# Patient Record
Sex: Female | Born: 1950
Health system: Southern US, Community
[De-identification: ages and names within clinical notes are randomized; demographics above are authoritative.]

## PROBLEM LIST (undated history)

## (undated) DIAGNOSIS — N189 Chronic kidney disease, unspecified: Secondary | ICD-10-CM

## (undated) DIAGNOSIS — E079 Disorder of thyroid, unspecified: Secondary | ICD-10-CM

## (undated) DIAGNOSIS — A6000 Herpesviral infection of urogenital system, unspecified: Secondary | ICD-10-CM

## (undated) DIAGNOSIS — E785 Hyperlipidemia, unspecified: Secondary | ICD-10-CM

## (undated) DIAGNOSIS — G459 Transient cerebral ischemic attack, unspecified: Secondary | ICD-10-CM

## (undated) DIAGNOSIS — F329 Major depressive disorder, single episode, unspecified: Secondary | ICD-10-CM

## (undated) DIAGNOSIS — I639 Cerebral infarction, unspecified: Secondary | ICD-10-CM

## (undated) DIAGNOSIS — F32A Depression, unspecified: Secondary | ICD-10-CM

## (undated) DIAGNOSIS — Z85820 Personal history of malignant melanoma of skin: Secondary | ICD-10-CM

## (undated) DIAGNOSIS — I10 Essential (primary) hypertension: Secondary | ICD-10-CM

## (undated) HISTORY — DX: Major depressive disorder, single episode, unspecified: F32.9

## (undated) HISTORY — PX: MELANOMA EXCISION: SHX5266

## (undated) HISTORY — DX: Disorder of thyroid, unspecified: E07.9

## (undated) HISTORY — DX: Transient cerebral ischemic attack, unspecified: G45.9

## (undated) HISTORY — DX: Herpesviral infection of urogenital system, unspecified: A60.00

## (undated) HISTORY — DX: Essential (primary) hypertension: I10

## (undated) HISTORY — PX: ABDOMINAL HYSTERECTOMY: SHX81

## (undated) HISTORY — DX: Personal history of malignant melanoma of skin: Z85.820

## (undated) HISTORY — PX: APPENDECTOMY: SHX54

## (undated) HISTORY — PX: CHOLECYSTECTOMY: SHX55

## (undated) HISTORY — DX: Chronic kidney disease, unspecified: N18.9

## (undated) HISTORY — DX: Depression, unspecified: F32.A

## (undated) HISTORY — DX: Cerebral infarction, unspecified: I63.9

## (undated) HISTORY — DX: Hyperlipidemia, unspecified: E78.5

---

## 2005-06-02 ENCOUNTER — Emergency Department (HOSPITAL_COMMUNITY): Admission: EM | Admit: 2005-06-02 | Discharge: 2005-06-02 | Payer: Self-pay | Admitting: Emergency Medicine

## 2006-10-02 DIAGNOSIS — G459 Transient cerebral ischemic attack, unspecified: Secondary | ICD-10-CM

## 2006-10-02 HISTORY — DX: Transient cerebral ischemic attack, unspecified: G45.9

## 2006-11-01 ENCOUNTER — Inpatient Hospital Stay (HOSPITAL_COMMUNITY): Admission: EM | Admit: 2006-11-01 | Discharge: 2006-11-04 | Payer: Self-pay | Admitting: Emergency Medicine

## 2006-11-02 ENCOUNTER — Encounter (INDEPENDENT_AMBULATORY_CARE_PROVIDER_SITE_OTHER): Payer: Self-pay | Admitting: Emergency Medicine

## 2008-01-21 ENCOUNTER — Emergency Department (HOSPITAL_COMMUNITY): Admission: EM | Admit: 2008-01-21 | Discharge: 2008-01-21 | Payer: Self-pay | Admitting: Emergency Medicine

## 2009-12-17 ENCOUNTER — Observation Stay (HOSPITAL_COMMUNITY): Admission: EM | Admit: 2009-12-17 | Discharge: 2009-12-19 | Payer: Self-pay | Admitting: Emergency Medicine

## 2009-12-18 ENCOUNTER — Encounter (INDEPENDENT_AMBULATORY_CARE_PROVIDER_SITE_OTHER): Payer: Self-pay | Admitting: Internal Medicine

## 2009-12-18 ENCOUNTER — Ambulatory Visit: Payer: Self-pay | Admitting: Vascular Surgery

## 2010-05-15 LAB — BASIC METABOLIC PANEL
BUN: 14 mg/dL (ref 6–23)
BUN: 17 mg/dL (ref 6–23)
CO2: 27 mEq/L (ref 19–32)
CO2: 28 mEq/L (ref 19–32)
Calcium: 8.8 mg/dL (ref 8.4–10.5)
Calcium: 9.4 mg/dL (ref 8.4–10.5)
Chloride: 106 mEq/L (ref 96–112)
Chloride: 107 mEq/L (ref 96–112)
Creatinine, Ser: 1.05 mg/dL (ref 0.4–1.2)
Creatinine, Ser: 1.28 mg/dL — ABNORMAL HIGH (ref 0.4–1.2)
GFR calc Af Amer: 52 mL/min — ABNORMAL LOW (ref >60)
GFR calc Af Amer: >60 mL/min (ref >60)
GFR calc non Af Amer: 43 mL/min — ABNORMAL LOW (ref >60)
GFR calc non Af Amer: 54 mL/min — ABNORMAL LOW (ref >60)
Glucose, Bld: 100 mg/dL — ABNORMAL HIGH (ref 70–99)
Glucose, Bld: 90 mg/dL (ref 70–99)
Potassium: 3 mEq/L — ABNORMAL LOW (ref 3.5–5.1)
Potassium: 3.2 mEq/L — ABNORMAL LOW (ref 3.5–5.1)
Sodium: 138 mEq/L (ref 135–145)
Sodium: 141 mEq/L (ref 135–145)

## 2010-05-15 LAB — DIFFERENTIAL
Basophils Absolute: 0 10*3/uL (ref 0.0–0.1)
Basophils Relative: 0 % (ref 0–1)
Eosinophils Absolute: 0.1 10*3/uL (ref 0.0–0.7)
Eosinophils Relative: 1 % (ref 0–5)
Lymphocytes Relative: 32 % (ref 12–46)
Lymphs Abs: 2.8 10*3/uL (ref 0.7–4.0)
Monocytes Absolute: 0.9 10*3/uL (ref 0.1–1.0)
Monocytes Relative: 10 % (ref 3–12)
Neutro Abs: 5.1 10*3/uL (ref 1.7–7.7)
Neutrophils Relative %: 57 % (ref 43–77)

## 2010-05-15 LAB — LIPID PANEL
Cholesterol: 141 mg/dL (ref 0–200)
HDL: 40 mg/dL (ref >39)
LDL Cholesterol: 74 mg/dL (ref 0–99)
Total CHOL/HDL Ratio: 3.5 RATIO
Triglycerides: 135 mg/dL (ref <150)
VLDL: 27 mg/dL (ref 0–40)

## 2010-05-15 LAB — HEPATIC FUNCTION PANEL
ALT: 38 U/L — ABNORMAL HIGH (ref 0–35)
AST: 26 U/L (ref 0–37)
Albumin: 4.1 g/dL (ref 3.5–5.2)
Alkaline Phosphatase: 81 U/L (ref 39–117)
Bilirubin, Direct: <0.1 mg/dL (ref 0.0–0.3)
Total Bilirubin: 0.6 mg/dL (ref 0.3–1.2)
Total Protein: 6.4 g/dL (ref 6.0–8.3)

## 2010-05-15 LAB — COMPREHENSIVE METABOLIC PANEL
ALT: 34 U/L (ref 0–35)
AST: 27 U/L (ref 0–37)
Albumin: 3.7 g/dL (ref 3.5–5.2)
Alkaline Phosphatase: 72 U/L (ref 39–117)
BUN: 14 mg/dL (ref 6–23)
CO2: 25 mEq/L (ref 19–32)
Calcium: 9 mg/dL (ref 8.4–10.5)
Chloride: 107 mEq/L (ref 96–112)
Creatinine, Ser: 1.12 mg/dL (ref 0.4–1.2)
GFR calc Af Amer: >60 mL/min (ref >60)
GFR calc non Af Amer: 50 mL/min — ABNORMAL LOW (ref >60)
Glucose, Bld: 120 mg/dL — ABNORMAL HIGH (ref 70–99)
Potassium: 3.2 mEq/L — ABNORMAL LOW (ref 3.5–5.1)
Sodium: 139 mEq/L (ref 135–145)
Total Bilirubin: 0.6 mg/dL (ref 0.3–1.2)
Total Protein: 6.4 g/dL (ref 6.0–8.3)

## 2010-05-15 LAB — CBC
HCT: 36.7 % (ref 36.0–46.0)
HCT: 38.3 % (ref 36.0–46.0)
Hemoglobin: 12.5 g/dL (ref 12.0–15.0)
Hemoglobin: 13.1 g/dL (ref 12.0–15.0)
MCH: 30.2 pg (ref 26.0–34.0)
MCH: 30.5 pg (ref 26.0–34.0)
MCHC: 34.1 g/dL (ref 30.0–36.0)
MCHC: 34.2 g/dL (ref 30.0–36.0)
MCV: 88.6 fL (ref 78.0–100.0)
MCV: 89.3 fL (ref 78.0–100.0)
Platelets: 267 10*3/uL (ref 150–400)
Platelets: 294 10*3/uL (ref 150–400)
RBC: 4.14 MIL/uL (ref 3.87–5.11)
RBC: 4.29 MIL/uL (ref 3.87–5.11)
RDW: 13.1 % (ref 11.5–15.5)
RDW: 13.1 % (ref 11.5–15.5)
WBC: 8.9 10*3/uL (ref 4.0–10.5)
WBC: 9.6 10*3/uL (ref 4.0–10.5)

## 2010-05-15 LAB — URINALYSIS, ROUTINE W REFLEX MICROSCOPIC
Bilirubin Urine: NEGATIVE
Glucose, UA: NEGATIVE mg/dL
Hgb urine dipstick: NEGATIVE
Ketones, ur: NEGATIVE mg/dL
Nitrite: NEGATIVE
Protein, ur: NEGATIVE mg/dL
Specific Gravity, Urine: 1.012 (ref 1.005–1.030)
Urobilinogen, UA: 0.2 mg/dL (ref 0.0–1.0)
pH: 6 (ref 5.0–8.0)

## 2010-05-15 LAB — CK TOTAL AND CKMB (NOT AT ARMC)
CK, MB: 1.4 ng/mL (ref 0.3–4.0)
CK, MB: 1.8 ng/mL (ref 0.3–4.0)
CK, MB: 1.9 ng/mL (ref 0.3–4.0)
Relative Index: INVALID (ref 0.0–2.5)
Relative Index: INVALID (ref 0.0–2.5)
Relative Index: INVALID (ref 0.0–2.5)
Total CK: 68 U/L (ref 7–177)
Total CK: 76 U/L (ref 7–177)
Total CK: 91 U/L (ref 7–177)

## 2010-05-15 LAB — POCT CARDIAC MARKERS
CKMB, poc: <1 ng/mL — ABNORMAL LOW (ref 1.0–8.0)
Myoglobin, poc: 54.8 ng/mL (ref 12–200)
Troponin i, poc: <0.05 ng/mL (ref 0.00–0.09)

## 2010-05-15 LAB — PROTIME-INR
INR: 0.98 (ref 0.00–1.49)
Prothrombin Time: 13.2 seconds (ref 11.6–15.2)

## 2010-05-15 LAB — TSH: TSH: 0.725 u[IU]/mL (ref 0.350–4.500)

## 2010-05-15 LAB — TROPONIN I
Troponin I: <0.01 ng/mL (ref 0.00–0.06)
Troponin I: <0.01 ng/mL (ref 0.00–0.06)
Troponin I: <0.01 ng/mL (ref 0.00–0.06)

## 2010-05-15 LAB — APTT: aPTT: 31 seconds (ref 24–37)

## 2010-07-16 NOTE — H&P (Signed)
NAME:  Becky [name], TIRONE NO.:  [id number]   MEDICAL RECORD NO.:  [id number]          PATIENT TYPE:  EMS   LOCATION:  MAJO                         FACILITY:  [facility]   PHYSICIAN:  Marlan Palau, M.D.  DATE OF BIRTH:  01-16-1951   DATE OF ADMISSION:  11/01/2006  DATE OF DISCHARGE:                              HISTORY & PHYSICAL   HISTORY OF PRESENT ILLNESS:  Becky [name] is a 60 year old right-  handed white female born 06-09-1950, with a history of  hypertension followed by Dr. Catha Gosselin.  This patient has no known  prior history of stroke but comes to the [facility] emergency room today  after onset of an event that occurred at around 4 p.m.  The patient was  trying to get into her car and noted that her right hand became weak and  clumsy.  The patient felt some numbness on the right face, had right  facial droop.  Denies any visual field changes at that time.  Did  complain of a headache for most of the day today.  The patient was noted  to have some slurred speech and a friend of the patient noted that the  speech also appeared to be somewhat mixed up, unintelligible.  The  patient came to the emergency room but resolved by the time she got back  into an examination room.  The entire event lasted 15-20 minutes.  The  patient has undergone a CT scan of the head that shows what appears to  be an old left frontal stroke.  No acute changes were seen.  Neurology  was called for further evaluation.   The patient has some residual right facial numbness or tingling  sensations but otherwise is back to her usual baseline.   PAST MEDICAL HISTORY:  1. History of new-onset transient right face and arm weakness and      numbness.  2. Hypertension.  3. Depression.  4. Renal calculi.  5. Appendectomy.  6. Hysterectomy.  7. Gallbladder resection.   MEDICATIONS:  1. Benicar 40 mg daily.  2. Hydrochlorothiazide 12.5 mg daily  3. Cymbalta 60 mg daily.   Patient has recently been treated for a kidney stone and has been on  some Cipro, Phenergan and Vicodin.   The patient does not smoke.  Drinks alcohol on occasion.   Again, has an allergy to PENICILLIN and IVP DYE.   SOCIAL HISTORY:  Notable that this patient is single, does not have any  children.  Lives in the [place] area.  Works for [employer]  Again, the patient has no children.   FAMILY MEDICAL HISTORY:  Mother is alive and has history of glaucoma and  hypertension.  Father died with an MI.  The patient has one sister with  hypertension.   REVIEW OF SYSTEMS:  Notable for some feeling of being hot throughout the  week.  The patient does have headaches from time to time.  Does note  some neck pain, some nausea, with a kidney stone recently and some  diarrhea with the kidney stone.  The patient denies  any problems  controlling the bladder and has some slight dizziness.  Denies any  blackout episodes or seizures.   PHYSICAL EXAMINATION:  VITALS:  Blood pressure is 162/95, heart rate 93,  respiratory rate 16, temperature afebrile.  GENERAL:  This patient is a minimally obese white female who is alert  and cooperative at the time examination.  HEENT: Head is atraumatic.  Eyes:  Pupils are equal, round and react to  light.  Discs are flat bilaterally.  NECK:  Supple.  No carotid bruits noted.  RESPIRATORY:  Clear.  CARDIOVASCULAR:  A regular rate and rhythm.  No obvious murmurs or rubs  noted.  ABDOMEN:  Positive bowel sounds.  No organomegaly or tenderness noted.  EXTREMITIES:  Without significant edema.  NEUROLOGIC EXAMINATION:  Cranial nerves as above.  Facial symmetry is  present.  The patient has good sensation in the face to pinprick and  soft touch bilaterally.  Has good strength of the facial muscle muscles  and the muscles of head turning and shoulder shrug bilaterally.  Speech  is well-enunciated and not aphasic.  Motor testing shows 5/5 strength in  all  fours.  Good symmetric motor tone is noted throughout.  No drift is  seen on the arms and legs.  Sensory testing is intact to pinprick, soft  touch and vibratory sensation throughout.  No evidence of extinction is  noted.  The patient has good finger-nose-finger and heel-to-shin.  Gait  was not tested.  Deep tendon reflexes symmetric.  Normal toes, downgoing  bilaterally.   LABORATORY VALUES:  Notable for a white count of 8.2, hemoglobin of  14.3, hematocrit 42.0, MCV of 87.9 platelets of 303.  INR of 0.9.  Sodium 138, potassium 3.7, chloride 108, glucose of 109, BUN of 18,  creatinine 1.1.   CT of the head is as above.   IMPRESSION:  1. Transient episode of right face and arm weakness, numbness,      clumsiness, probable transient ischemic attack event.  2. Hypertension.   This patient's only main risk factor is hypertension.  The patient has  essentially resolved her deficit but has subjective numbness of the  right face.  NIH Stroke Scale today is 0.  The patient is not a t-PA  candidate due to minimal deficit.  The patient has what appears to be an  old subclinical left frontal stroke.  Will pursue further workup at this  point.   PLAN:  1. Admission to [facility].  2. MRI of the brain.  3. MRI angiogram of the intracranial and extracranial vessels.  4. 2-D echocardiogram.  5. Aspirin therapy.   Will follow the patient's clinical course while in-house.      Marlan Palau, M.D.  Electronically Signed     CKW/MEDQ  D:  11/01/2006  T:  11/02/2006  Job:  [id number]   cc:   Caryn Bee L. Little, M.D.  [facility]

## 2010-07-16 NOTE — Discharge Summary (Signed)
NAMEJANAE, [name]              ACCOUNT NO.:  [id number]   MEDICAL RECORD NO.:  [id number]          PATIENT TYPE:  INP   LOCATION:  3708                         FACILITY:  [facility]   PHYSICIAN:  Pramod P. Pearlean Brownie, MD    DATE OF BIRTH:  [birth year]   DATE OF ADMISSION:  11/01/2006  DATE OF DISCHARGE:  11/04/2006                               DISCHARGE SUMMARY   DIAGNOSES AT TIME OF DISCHARGE:  1. Cerebral infarct, transient symptoms secondary to small-vessel      disease.  2. Dyslipidemia.  3. Urinary tract prior to admission.  4. Hypertension.  5. Depression.  6. Renal calculi.  7. Cholecystectomy.  8. Hysterectomy.  9. Appendectomy.   MEDICINES AT TIME OF DISCHARGE:  1. Cipro 500 mg b.i.d. for a total of four more days to complete a 10      days course.  2. Aggrenox one p.o. q.h.s. x2 weeks then increase to b.i.d.  3. Aspirin 81 mg q.a.m. x2 weeks then discontinue.  4. Cymbalta 60 mg a day.  5. Benicar 40 mg a day.  6. Hydrochlorothiazide 12.5 mg a day.   STUDIES PERFORMED:  1. CT of the brain on admission shows no acute abnormality, inferior      left frontal lobe encephalomalacia likely from prior trauma or      infarct.  2. MRI of the brain shows punctate area of diffusion abnormality in      the left centrum semiovale.  May reflect sequelae of TIA and small vessel territory.  Advanced  chronic  ischemic sequelae including prior cortically based infero  anterior left frontal lobe and chronic white matter ischemia.  1. MRA of the neck.  Great vessels are obscured.  2. MRA of the head.  Atherosclerotic disease involving the right      proximal MCA without focal stenosis.  Moderate to severe artifact      versus atherosclerotic disease in P2.  3. Chest x-ray shows no active disease.  4. A 2-D echocardiogram shows EF of 60% with no left ventricular      regional wall motion abnormalities.  There was a trivial      pericardial effusion posterior to the heart.  5. Carotid  Doppler performed, results pending.  6. Transcranial Doppler to be performed, results pending.  7. EKG shows normal sinus rhythm, nonspecific T-wave abnormalities.   HISTORY OF PRESENT ILLNESS:  Becky [name] is a 60 year old right-  handed Caucasian female with a history of hypertension.  She has no  known history of stroke but comes to the [facility] emergency room after  onset.  The patient was trying to get into her car and noticed her right hand  became weak and clumsy.  The patient felt some numbness of the right  face and had right facial droop.  She denied any visual field changes at  that time.  She did complain of a headache for the rest of the day.  The  patient was noticed to have some slurred speech and a friend of the  patient noticed speech also appeared to be somewhat mixed  up and  unintelligible.  The patient came to the emergency room but resolved by  the time she got back to the exam room.  The entire event lasted 15-20  minutes.  CT scan shows what appears to be an old left frontal stroke.  No acute abnormality.  Neurology was called for further evaluation.  She  was not a TPA candidate secondary to time of onset being greater than 3  hours and quick resolution.   HOSPITAL COURSE:  MRI revealed a small punctate infarct in the right  neck.  It is likely a cerebral infarct with transient symptoms as the  patient has totally resolved.  Stroke workup has been completed.  She  has some small vessel findings of hypertension and dyslipidemia for  which she was started on Zocor 40.  She needs ongoing risk factor  control especially with newly diagnosed lipids and ongoing diagnosis of  hypertension.  The patient has been scheduled for an outpatient bubble  study and emboli monitoring with Dr. Pearlean Brownie at the time of discharge.  She had no physical therapy needs and was safe to return home with  family.   CONDITION ON DISCHARGE:  The patient was alert and oriented x3.   Speech  clear.  No aphasia.  No extremity weakness.  Gait steady.   DISCHARGE/PLAN:  1. Discharge home with family once transcranial Doppler and carotid      Doppler are completed.  2. Aggrenox for secondary stroke prevention.  3. Follow-up primary care physician Dr. Clarene Duke for risk factor      control, especially with new statin in  follow up.  1. Follow up with Dr. Delia Heady for emboli study and bubble      monitoring.  She is to call for an appointment.      Annie Main, N.P.    ______________________________  Sunny Schlein. Pearlean Brownie, MD    SB/MEDQ  D:  11/04/2006  T:  11/04/2006  Job:  [id number]   cc:   Pramod P. Pearlean Brownie, MD  Becky [name], M.D.

## 2010-12-03 LAB — GLUCOSE, CAPILLARY: Glucose-Capillary: 109 — ABNORMAL HIGH

## 2010-12-13 LAB — CBC
HCT: 38.7
Hemoglobin: 13.5
MCHC: 34.8
MCV: 87.9
Platelets: 303
RBC: 4.4
RDW: 13.4
WBC: 8.2

## 2010-12-13 LAB — TROPONIN I: Troponin I: 0.02

## 2010-12-13 LAB — CK TOTAL AND CKMB (NOT AT ARMC)
CK, MB: 1.4
Relative Index: INVALID
Total CK: 52

## 2010-12-13 LAB — LIPID PANEL
Cholesterol: 235 — ABNORMAL HIGH
HDL: 30 — ABNORMAL LOW
LDL Cholesterol: 150 — ABNORMAL HIGH
Total CHOL/HDL Ratio: 7.8
Triglycerides: 277 — ABNORMAL HIGH
VLDL: 55 — ABNORMAL HIGH

## 2010-12-13 LAB — POCT I-STAT 3, ART BLOOD GAS (G3+)
Acid-base deficit: 1
Bicarbonate: 22.6
O2 Saturation: 95
Operator id: 279121
Patient temperature: 98.1
TCO2: 24
pCO2 arterial: 33.7 — ABNORMAL LOW
pH, Arterial: 7.434 — ABNORMAL HIGH
pO2, Arterial: 73 — ABNORMAL LOW

## 2010-12-13 LAB — BASIC METABOLIC PANEL
BUN: 14
BUN: 17
CO2: 22
CO2: 25
Calcium: 9
Calcium: 9.3
Chloride: 109
Chloride: 110
Creatinine, Ser: 0.91
Creatinine, Ser: 0.98
GFR calc Af Amer: >60
GFR calc Af Amer: >60
GFR calc non Af Amer: 59 — ABNORMAL LOW
GFR calc non Af Amer: >60
Glucose, Bld: 104 — ABNORMAL HIGH
Glucose, Bld: 99
Potassium: 3.4 — ABNORMAL LOW
Potassium: 4.2
Sodium: 138
Sodium: 140

## 2010-12-13 LAB — PROTIME-INR
INR: 0.9
Prothrombin Time: 12.8

## 2010-12-13 LAB — DIFFERENTIAL
Basophils Absolute: 0
Basophils Relative: 0
Eosinophils Absolute: 0.3
Eosinophils Relative: 3
Lymphocytes Relative: 25
Lymphs Abs: 2.1
Monocytes Absolute: 0.7
Monocytes Relative: 9
Neutro Abs: 5.2
Neutrophils Relative %: 63

## 2010-12-13 LAB — COMPREHENSIVE METABOLIC PANEL
ALT: 28
AST: 23
Albumin: 3.9
Alkaline Phosphatase: 82
BUN: 14
CO2: 23
Calcium: 9.4
Chloride: 109
Creatinine, Ser: 1.1
GFR calc Af Amer: >60
GFR calc non Af Amer: 52 — ABNORMAL LOW
Glucose, Bld: 141 — ABNORMAL HIGH
Potassium: 3.1 — ABNORMAL LOW
Sodium: 138
Total Bilirubin: 0.7
Total Protein: 6.6

## 2010-12-13 LAB — RAPID URINE DRUG SCREEN, HOSP PERFORMED
Amphetamines: NOT DETECTED
Barbiturates: NOT DETECTED
Benzodiazepines: NOT DETECTED
Cocaine: NOT DETECTED
Opiates: NOT DETECTED
Tetrahydrocannabinol: NOT DETECTED

## 2010-12-13 LAB — I-STAT 8, (EC8 V) (CONVERTED LAB)
Acid-base deficit: 3 — ABNORMAL HIGH
BUN: 18
Bicarbonate: 20.6
Chloride: 108
Glucose, Bld: 109 — ABNORMAL HIGH
HCT: 42
Hemoglobin: 14.3
Operator id: 196461
Potassium: 3.7
Sodium: 138
TCO2: 22
pCO2, Ven: 31.2 — ABNORMAL LOW
pH, Ven: 7.429 — ABNORMAL HIGH

## 2010-12-13 LAB — URINALYSIS, ROUTINE W REFLEX MICROSCOPIC
Bilirubin Urine: NEGATIVE
Glucose, UA: NEGATIVE
Hgb urine dipstick: NEGATIVE
Ketones, ur: NEGATIVE
Nitrite: NEGATIVE
Protein, ur: NEGATIVE
Specific Gravity, Urine: 1.008
Urobilinogen, UA: 0.2
pH: 5.5

## 2010-12-13 LAB — HOMOCYSTEINE: Homocysteine: 12.6

## 2010-12-13 LAB — HEMOGLOBIN A1C
Hgb A1c MFr Bld: 5.6
Mean Plasma Glucose: 122

## 2010-12-13 LAB — POCT I-STAT CREATININE
Creatinine, Ser: 1.1
Operator id: 196461

## 2010-12-13 LAB — ETHANOL: Alcohol, Ethyl (B): <5

## 2010-12-13 LAB — APTT: aPTT: 24

## 2013-03-26 ENCOUNTER — Ambulatory Visit (INDEPENDENT_AMBULATORY_CARE_PROVIDER_SITE_OTHER): Payer: BC Managed Care – PPO | Admitting: Emergency Medicine

## 2013-03-26 VITALS — BP 122/84 | HR 90 | Temp 98.0°F | Resp 16 | Ht 60.0 in | Wt 179.0 lb

## 2013-03-26 DIAGNOSIS — T1510XA Foreign body in conjunctival sac, unspecified eye, initial encounter: Secondary | ICD-10-CM

## 2013-03-26 MED ORDER — TOBRAMYCIN 0.3 % OP SOLN: 2.0000 [drp] | OPHTHALMIC | Status: DC

## 2013-03-26 MED ORDER — ACETAMINOPHEN-CODEINE #3 300-30 MG PO TABS: 1.0000 | ORAL_TABLET | ORAL | Status: DC | PRN

## 2013-03-26 NOTE — Progress Notes (Signed)
[facility] [address] [place] [address] [phone]  Date:  03/26/2013   Name:  Becky [name]   DOB:  05-27-1950   MRN:  [id number]  PCP:  No primary provider on file.    Chief Complaint: Eye Drainage   History of Present Illness:  Becky [name] is a 63 y.o. very pleasant female patient who presents with the following:  No history of injury or foreign body.  Seen by ophthalmologist for "pink eye" 2-3 weeks ago.  Now has pain in left eye associated with swelling of the lids and photophobia.  Says eye watering and that started yesterday.    There are no active problems to display for this patient.   Past Medical History  Diagnosis Date  . Hypertension     Past Surgical History  Procedure Laterality Date  . Appendectomy    . Cholecystectomy    . Abdominal hysterectomy      History  Substance Use Topics  . Smoking status: Never Smoker   . Smokeless tobacco: Not on file  . Alcohol Use: No    History reviewed. No pertinent family history.  Allergies  Allergen Reactions  . Penicillins Anaphylaxis  . Iohexol      Desc: iodine     Medication list has been reviewed and updated.  No current outpatient prescriptions on file prior to visit.   No current facility-administered medications on file prior to visit.    Review of Systems:  As per HPI, otherwise negative.    Physical Examination: Filed Vitals:   03/26/13 1614  BP: 122/84  Pulse: 90  Temp: 98 F (36.7 C)  Resp: 16   Filed Vitals:   03/26/13 1614  Height: 5' (1.524 m)  Weight: 179 lb (81.194 kg)   Body mass index is 34.96 kg/(m^2). Ideal Body Weight: Weight in (lb) to have BMI = 25: 127.7  GEN: WDWN, NAD, Non-toxic, A & O x 3 HEENT: Atraumatic, Normocephalic. Neck supple. No masses, No LAD. Ears and Nose: No external deformity. CV: RRR, No M/G/R. No JVD. No thrill. No extra heart sounds. PULM: CTA B, no wheezes, crackles, rhonchi. No retractions.  No resp. distress. No accessory muscle use. ABD: S, NT, ND, +BS. No rebound. No HSM. EXTR: No c/c/e NEURO Normal gait.  PSYCH: Normally interactive. Conversant. Not depressed or anxious appearing.  Calm demeanor.  LEFT eye:  PRRERLA EOMI.  Injected conjunctiva.  Swollen lids.  No drainage or marginal crusting.  Fluorescein negative.  Had a filamentous foreign body removed.  Vision is normal by confrontation.     Assessment and Plan: Conjunctival sac FB tobrex Tylenol #3 Ophthalmologist Monday  Signed,  Ellison Carwin, MD

## 2013-03-26 NOTE — Patient Instructions (Signed)
Eye, Foreign Body The term foreign body refers to any object near, on the surface of or in the eye that should not be there. A foreign body may be a small speck of dirt or dust, a hair or eyelash, a splinter or any object. CAUSES  Foreign bodies can get in the eye by:  Flying pieces of something that was broken or destroyed (debris).  A sudden injury (trauma) to the eye. SYMPTOMS  Symptoms depend on what the foreign body is and where it is in the eye. The most common locations are:  On the inner surface of the upper or lower eyelids or on the covering of the white part of the eye (conjunctiva). Symptoms in this location are:  Irritating and painful, especially when blinking.  Feeling like something is in the eye.  On the surface of the clear covering on the front of the eye (cornea). A corneal foreign body has symptoms that:  Are painful and irritating since the cornea is very sensitive.  Form small "rust rings" around a metallic foreign body. Metallic foreign bodies stick more firmly to the surface of the cornea.  Inside the eyeball. Infection can happen fast and can be hard to treat with antibiotics. This is an extremely dangerous situation. Foreign bodies inside the eye can threaten vision. A person may even loose their eye. Foreign bodies inside the eye may cause:  Great pain.  Immediate loss of vision. DIAGNOSIS  Foreign bodies are found during an exam by an eye specialist. Those that are on the eyelids, conjunctiva or cornea are usually (but not always) easily found. When a foreign body is inside the eyeball, a cataract may form almost right away. This makes it hard for an ophthalmologist to find the foreign body. Special tests may be needed, including ultrasound testing, X-rays and CT scans. TREATMENT   Foreign bodies that are on the eyelids, conjunctiva or cornea are often removed easily and painlessly.  If the foreign body has caused a scratch or abrasion of the cornea,  antibiotic drops, ointments and/or a tight patch called a "pressure patch" may be needed. Follow-up exams will be needed for several days until the abrasion heals.  Surgery is needed right away if the foreign body is inside the eyeball. This is a medical emergency. An antibiotic therapy will likely be given to stop an infection. HOME CARE INSTRUCTIONS  The use of eye patches is not universal. Their use varies from state to state and from caregiver to caregiver. If an eye patch was applied:  Keep the eye patch on for as long as directed by your caregiver until the follow-up appointment.  Do not remove the patch to put in medications unless instructed to do so. When replacing the patch, retape it as it was before. Follow the same procedure if the patch becomes loose.  WARNING: Do not drive or operate machinery while the eye is patched. The ability to judge distances will be impaired.  Only take over-the-counter or prescription medicines for pain, discomfort or fever as directed by the caregiver. If no eye patch was applied:  Keep the eye closed as much as possible. Do not rub the eye.  Wear dark glasses as needed to protect the eyes from bright light.  Do not wear contact lenses until the eye feels normal again, or as instructed.  Wear protective eye covering if there is a risk of eye injury. This is important when working with high speed tools.  Only take over-the-counter or   prescription medicines for pain, discomfort or fever as directed by the caregiver. SEEK IMMEDIATE MEDICAL CARE IF:   Pain increases in the eye or the vision changes.  You or your child has problems with the eye patch.  The injury to the eye appears to be getting larger.  There is discharge from the injured eye.  Swelling and/or soreness (inflammation) develops around the affected eye.  You or your child has an oral temperature above 102 F (38.9 C), not controlled by medicine.  Your baby is older than 3  months with a rectal temperature of 102 F (38.9 C) or higher.  Your baby is 3 months old or younger with a rectal temperature of 100.4 F (38 C) or higher. MAKE SURE YOU:   Understand these instructions.  Will watch your condition.  Will get help right away if you are not doing well or get worse. Document Released: 02/17/2005 Document Revised: 05/12/2011 Document Reviewed: 07/15/2012 Exit[employer] Patient Information 2014 [employer].  

## 2013-06-30 ENCOUNTER — Inpatient Hospital Stay: Payer: Self-pay | Admitting: Psychiatry

## 2013-06-30 DIAGNOSIS — F320 Major depressive disorder, single episode, mild: Secondary | ICD-10-CM

## 2013-06-30 LAB — BEHAVIORAL MEDICINE 1 PANEL
Albumin: 4.1 g/dL (ref 3.4–5.0)
Alkaline Phosphatase: 98 U/L
Anion Gap: 8 (ref 7–16)
BUN: 21 mg/dL — ABNORMAL HIGH (ref 7–18)
Basophil #: 0.1 10*3/uL (ref 0.0–0.1)
Basophil %: 0.7 %
Bilirubin,Total: 0.4 mg/dL (ref 0.2–1.0)
Calcium, Total: 9.4 mg/dL (ref 8.5–10.1)
Chloride: 106 mmol/L (ref 98–107)
Co2: 26 mmol/L (ref 21–32)
Creatinine: 1.27 mg/dL (ref 0.60–1.30)
EGFR (African American): 52 — ABNORMAL LOW
EGFR (Non-African Amer.): 45 — ABNORMAL LOW
Eosinophil #: 0.2 10*3/uL (ref 0.0–0.7)
Eosinophil %: 1.7 %
Glucose: 93 mg/dL (ref 65–99)
HCT: 38.7 % (ref 35.0–47.0)
HGB: 13 g/dL (ref 12.0–16.0)
Lymphocyte #: 2.7 10*3/uL (ref 1.0–3.6)
Lymphocyte %: 22.9 %
MCH: 29.8 pg (ref 26.0–34.0)
MCHC: 33.6 g/dL (ref 32.0–36.0)
MCV: 89 fL (ref 80–100)
Monocyte #: 1.1 x10 3/mm — ABNORMAL HIGH (ref 0.2–0.9)
Monocyte %: 9.1 %
Neutrophil #: 7.7 10*3/uL — ABNORMAL HIGH (ref 1.4–6.5)
Neutrophil %: 65.6 %
Osmolality: 282 (ref 275–301)
Platelet: 341 10*3/uL (ref 150–440)
Potassium: 3.2 mmol/L — ABNORMAL LOW (ref 3.5–5.1)
RBC: 4.36 10*6/uL (ref 3.80–5.20)
RDW: 13.4 % (ref 11.5–14.5)
SGOT(AST): 41 U/L — ABNORMAL HIGH (ref 15–37)
SGPT (ALT): 49 U/L (ref 12–78)
Sodium: 140 mmol/L (ref 136–145)
Thyroid Stimulating Horm: 1.29 u[IU]/mL
Total Protein: 7.4 g/dL (ref 6.4–8.2)
WBC: 11.7 10*3/uL — ABNORMAL HIGH (ref 3.6–11.0)

## 2013-06-30 LAB — URINALYSIS, COMPLETE
Bacteria: NONE SEEN
Bilirubin,UR: NEGATIVE
Blood: NEGATIVE
Glucose,UR: NEGATIVE mg/dL (ref 0–75)
Ketone: NEGATIVE
Nitrite: NEGATIVE
Ph: 6 (ref 4.5–8.0)
Protein: NEGATIVE
RBC,UR: <1 /HPF (ref 0–5)
Specific Gravity: 1.015 (ref 1.003–1.030)
Squamous Epithelial: <1
WBC UR: 1 /HPF (ref 0–5)

## 2013-07-01 ENCOUNTER — Ambulatory Visit: Payer: Self-pay | Admitting: Psychiatry

## 2013-11-14 ENCOUNTER — Encounter: Payer: Self-pay | Admitting: General Surgery

## 2013-11-14 DIAGNOSIS — I639 Cerebral infarction, unspecified: Secondary | ICD-10-CM

## 2013-11-14 DIAGNOSIS — I10 Essential (primary) hypertension: Secondary | ICD-10-CM

## 2013-11-14 DIAGNOSIS — E7800 Pure hypercholesterolemia, unspecified: Secondary | ICD-10-CM

## 2013-11-14 HISTORY — DX: Pure hypercholesterolemia, unspecified: E78.00

## 2013-11-14 HISTORY — DX: Cerebral infarction, unspecified: I63.9

## 2014-06-24 NOTE — H&P (Signed)
PATIENT NAME:  Becky [name], Becky [name] MR#:  [id number] DATE OF BIRTH:  09/27/1950  DATE OF ADMISSION:  06/30/2013  IDENTIFYING INFORMATION AND CHIEF COMPLAINT: A 64 year old woman with history of major depression. Admitted voluntarily to the hospital for ECT. Chief complaint: "I'm nervous."   HISTORY OF PRESENT ILLNESS: Information obtained from the patient and my prior interview with her as well as the chart. The patient gives a history of multiple symptoms of major depression, including extremely depressed mood which is persistent almost all of the time, lack of interest in recreation or normal social activities, general fatigue present all of the time, negative thoughts, frequently about herself. General hopelessness. Slowing of cognition. All of these have been present for years but have been getting progressively worse. She has been tried on multiple antidepressants but over the last year, her symptoms have been progressing despite medication trials. The patient is not endorsing acute suicidal ideation but does have a long-standing sense of hopelessness and negativity. She was referred by her primary psychiatrist to see me for ECT workup.   PAST PSYCHIATRIC HISTORY: The patient has had bouts of major depression intermittently for years. No history of mania. She has had ECT at least once before several years ago in [place] with positive results. She has been on multiple medications, including several antidepressants, including Prozac, Effexor, Lexapro and Pristiq, as well as additional antipsychotics and more recently lithium, all without any lasting benefit.   FAMILY HISTORY: Positive for depression.   SOCIAL HISTORY: The patient is unmarried. Has no children. Works as an Web designer at [employer]. Social life is fairly restricted.   SUBSTANCE ABUSE HISTORY: Denies regular use of alcohol or any history of alcohol or drug abuse.   MEDICAL HISTORY: The patient has high blood pressure  for which she takes medication. Dyslipidemia. Gives a history of having had what sounds like a transient ischemic episode a couple of years ago. Has hypothyroidism.   CURRENT MEDICATIONS: Losartan/hydrochlorothiazide combination 100/25 mg once per day, Lipitor 40 mg twice a day, Zetia 10 mg at bedtime, Synthroid 25 mcg per day, aspirin 325 mg twice a day.   ALLERGIES: IV DYE AND PENICILLIN.   REVIEW OF SYSTEMS: Depressed mood, low energy, slow thinking, decreased cognitive skills. Sleeps well. Feels tired during the day. Appetite stable. Denies suicidal or homicidal ideation. Denies hallucinations. No other physical symptoms. The rest of the review of systems is negative.   MENTAL STATUS EXAM: Casually dressed, appropriately groomed woman, looks her stated age, cooperative with the interview. Eye contact good. Psychomotor activity sluggish. Affect flat. Mood stated as nervous. Thoughts are slow but lucid. Denies auditory or visual hallucinations. Denies current suicidal or homicidal ideation. Alert and oriented x 4. Judgment and insight adequate. Short- and long-term memory intact to testing.   PHYSICAL EXAM:  GENERAL: Healthy-appearing woman slightly overweight.  SKIN: No skin lesions identified.  HEENT: Pupils equal and reactive. Face symmetric. Oral mucosa dry. Teeth normal.  NECK AND BACK: Nontender to palpation.  EXTREMITIES: Full range of motion in all extremities. Normal gait.  NEUROLOGIC: Cranial nerves symmetric and normal.  LUNGS: Clear to auscultation.  HEART: Regular rate and rhythm.  ABDOMEN: Soft, nontender. Normal bowel sounds.  CURRENT VITAL SIGNS: Show a temperature of 99.1, pulse 103, respirations 20, blood pressure 107/71.   LABORATORY RESULTS: Chemistry panel on admission shows AST slightly elevated at 41, potassium low at 3.2, BUN elevated at 21, creatinine 127. CBC: Slightly elevated white count at 11.7, otherwise unremarkable. Urinalysis negative.  EKG performed and  showing normal sinus rhythm. Slightly prolonged QT interval but not over 500. Chest x-ray is all normal.   ASSESSMENT: A 64 year old woman with major depression, recurrent, severe, unresponsive to multiple medications. Positive past history of response to electroconvulsive therapy. Requesting electroconvulsive therapy treatment. Good candidate based on history and presentation. No medical contraindication.   TREATMENT PLAN: The patient is admitted to psychiatry for workup and initiation of ECT treatment. Anticipate first ECT treatment right unilateral to be performed tomorrow morning under general anesthesia. She will be seen by anesthesia prior to that. If everything goes well and she recovers without difficulty, we will anticipate discharge tomorrow afternoon, with followup course of ECT done as an outpatient. No other change in medication right now.   DIAGNOSIS, PRINCIPAL AND PRIMARY:  AXIS I: Major depression, severe, recurrent.   SECONDARY DIAGNOSES:  AXIS I: No further.  AXIS II: No diagnosis.  AXIS III: Hypothyroidism, moderately overweight, high blood pressure, past history of transient ischemic attack, dyslipidemia.  AXIS IV: Severe from social isolation and depression.  AXIS V: Functioning at time of evaluation 40.    ____________________________ Gonzella Lex, MD jtc:gb D: 06/30/2013 23:51:58 ET T: 07/01/2013 00:08:33 ET JOB#: [id number]  cc: Gonzella Lex, MD, <Dictator> Gonzella Lex MD ELECTRONICALLY SIGNED 07/02/2013 16:07

## 2014-06-24 NOTE — Discharge Summary (Signed)
PATIENT NAME:  Becky [name], BARIA MR#:  [id number] DATE OF BIRTH:  Nov 28, 1950  DATE OF ADMISSION:  06/30/2013  DATE OF DISCHARGE:  07/01/2013  DATE OF ASSESSMENT:  07/01/2013  Identifying information and hospital course: A 64 year old woman who was admitted for ECT. The patient has a diagnosis of major depression which has been unresponsive to medications and caused severe compromises in her quality of life. She had been accepted for ECT treatment. She was admitted to the hospital on the 30th.   The usual laboratory work-up including full chemistry, profile and CBC, urinalysis, chest x-ray, and EKG were performed. Tests were reviewed and there is nothing there to prohibit ECT. She was seen by anesthesia this morning who had no problem with her getting ECT. The patient met with the ECT treatment team and signed consent paperwork.   This morning she had a session of right unilateral ECT treatment. She tolerated the procedure well, but in the recovery room, had some agitation that required some extra medication. This afternoon, she is slightly groggy but is awake and alert enough to converse and discuss the treatment plan.   She continues to deny suicidal ideation. She is calm and appropriate in her behavior. There is a plan in place for a friend of hers to bring her to the hospital Monday, Wednesday and Friday for ECT treatment. The patient is to continue taking her current medication as prescribed and follow-up with Dr. Toy Care.  She will come back to our hospital for outpatient ECT on Monday. No change to medication that she was taking previously.   During her time here, in addition to ECT, she received group and individual counseling and psychiatric evaluation.   Mental status exam at discharge: Casually dressed, neatly groomed woman, looks her stated age, cooperative with the interview although she looks a little tired. Eye contact good. Psychomotor activity sluggish. Speech decreased in total amount but  understandable. Affect slightly blunted. Mood stated as okay. Thoughts are slow but lucid. No loosening of associations or delusions evident. Denies auditory or visual hallucinations. Denies suicidal or homicidal ideation. She is alert and oriented x4. Short-term memory impaired just for some of the events around the ECT treatment itself but otherwise intact right now. Long-term memory intact. Good fund of knowledge.   LABORATORY RESULTS: The chemistry panel had a slightly high BUN at 21, and a low potassium at 3.2. AST slightly elevated at 41, nothing remarkable. CBC: Had slightly elevated white count at 11.7, otherwise normal. Urinalysis unremarkable.   EKG and chest x-ray all unremarkable.   DISCHARGE MEDICATIONS: She is to continue taking the same medicine she was on before which is Abilify 5 mg at night, Focalin-XR 40 mg per day, levothyroxine 25 mcg per day, aspirin 325 mg twice a day, atorvastatin 40 mg twice a day, Zetia 10 mg at night, Zofran p.r.n. for nausea.   DISPOSITION: Discharge home.  FOLLOWUP:  For a next ECT treatment on Monday.   DiagnosEs, principal and primary:  AXIS I: Major depression, severe, recurrent.   SECONDARY DIAGNOSES: AXIS I: Attention deficit/hyperactivity disorder by history.   AXIS II: Deferred.   AXIS III: Hypertension, history of transient ischemic attack, hypothyroid.   AXIS IV: Moderate from loneliness and illness.   AXIS V: Functioning at time of discharge, 55.   ____________________________ Gonzella Lex, MD jtc:jb D: 07/01/2013 14:00:20 ET  T: 07/01/2013 15:48:20 ET JOB#: [id number] cc: Gonzella Lex, MD, <Dictator> Gonzella Lex MD ELECTRONICALLY SIGNED 07/02/2013 16:07

## 2014-10-02 ENCOUNTER — Ambulatory Visit: Payer: Self-pay | Admitting: Psychiatry

## 2014-10-19 ENCOUNTER — Other Ambulatory Visit: Payer: Self-pay | Admitting: Family Medicine

## 2014-10-19 DIAGNOSIS — I639 Cerebral infarction, unspecified: Secondary | ICD-10-CM

## 2014-12-15 ENCOUNTER — Ambulatory Visit
Admission: RE | Admit: 2014-12-15 | Discharge: 2014-12-15 | Disposition: A | Discharge status: Home / Self Care | Payer: BLUE CROSS/BLUE SHIELD | Source: Ambulatory Visit | Attending: Family Medicine | Admitting: Family Medicine

## 2014-12-15 DIAGNOSIS — I639 Cerebral infarction, unspecified: Secondary | ICD-10-CM

## 2015-08-28 ENCOUNTER — Other Ambulatory Visit: Payer: Self-pay | Admitting: Family Medicine

## 2015-08-28 DIAGNOSIS — R2689 Other abnormalities of gait and mobility: Secondary | ICD-10-CM

## 2015-09-06 ENCOUNTER — Ambulatory Visit: Payer: BLUE CROSS/BLUE SHIELD | Admitting: Neurology

## 2015-09-11 ENCOUNTER — Other Ambulatory Visit: Payer: BLUE CROSS/BLUE SHIELD

## 2015-09-12 ENCOUNTER — Ambulatory Visit: Payer: BLUE CROSS/BLUE SHIELD | Admitting: Cardiology

## 2015-09-18 ENCOUNTER — Encounter: Payer: Self-pay | Admitting: Cardiology

## 2015-09-18 ENCOUNTER — Encounter (INDEPENDENT_AMBULATORY_CARE_PROVIDER_SITE_OTHER): Payer: Self-pay

## 2015-09-18 ENCOUNTER — Ambulatory Visit (INDEPENDENT_AMBULATORY_CARE_PROVIDER_SITE_OTHER): Payer: BLUE CROSS/BLUE SHIELD | Admitting: Cardiology

## 2015-09-18 VITALS — BP 126/94 | HR 85 | Ht 60.0 in | Wt 160.4 lb

## 2015-09-18 DIAGNOSIS — I4581 Long QT syndrome: Secondary | ICD-10-CM | POA: Diagnosis not present

## 2015-09-18 DIAGNOSIS — R9431 Abnormal electrocardiogram [ECG] [EKG]: Secondary | ICD-10-CM

## 2015-09-18 NOTE — Progress Notes (Signed)
Electrophysiology Office Note   Date:  09/18/2015   ID:  Becky [name], [place] August 22, 1950, MRN [id number]  PCP:  Gennette Pac, MD  Primary Electrophysiologist:  Constance Haw, MD    Chief Complaint  Patient presents with  . New Patient (Initial Visit)    prolonged QT     History of Present Illness: Becky [name] is a 65 y.o. female who presents today for electrophysiology evaluation.   She presents for evaluation of falls and long QT interval. She says that her primary physician  Currently has her off of all of her antidepressants and is planning to start her on a new class. She says that she had 7 episodes of falls in the past. She says that when she falls, she is usually up and walking around. There is no other trigger. She says that when she is up and walking, she feels like her legs are moving faster than her upper body and she falls. She says that she can tell each time that she is going to have a fall. She has no palpitations during this, and has not yet lost consciousness. She feels well after the fall. She has no dizziness or altered mental status. She says that the falls has stopped since her medications have been adjusted.   Today, she denies symptoms of palpitations, chest pain, shortness of breath, orthopnea, PND, lower extremity edema, claudication, dizziness, presyncope, syncope, bleeding, or neurologic sequela. The patient is tolerating medications without difficulties and is otherwise without complaint today.    Past Medical History  Diagnosis Date  . Hypertension   . TIA (transient ischemic attack) 8/08    followed by neurologist Dr Leonie Man  . Hyperlipidemia   . Stroke [facility]     Old stroke seen on MRI 12/2009, carotid doppler w minimal plaque, MRI off the head otherwise neg for acute disease.  . Genital herpes   . History of melanoma     followed by derm Dr Delman Cheadle  . Depression     followed by Dr Toy Care  . Chronic kidney disease   . Thyroid  disease     hypothyroid. Followed by Dr Toy Care   Past Surgical History  Procedure Laterality Date  . Appendectomy    . Cholecystectomy    . Abdominal hysterectomy    . Melanoma excision      x2     Current Outpatient Prescriptions  Medication Sig Dispense Refill  . acetaminophen-codeine (TYLENOL #3) 300-30 MG per tablet Take 1-2 tablets by mouth every 4 (four) hours as needed for moderate pain. 15 tablet 0  . acyclovir (ZOVIRAX) 200 MG capsule Take 200 mg by mouth 5 (five) times daily.    Marland Kitchen aspirin EC 325 MG tablet Take 325 mg by mouth daily.    Marland Kitchen atorvastatin (LIPITOR) 40 MG tablet Take 80 mg by mouth daily.    . Calcium 250 MG CAPS Take 500 mg by mouth 2 (two) times daily.    . Cholecalciferol (VITAMIN D) 2000 UNITS tablet Take 2,000 Units by mouth daily.    Marland Kitchen ezetimibe (ZETIA) 10 MG tablet Take 10 mg by mouth daily.    Marland Kitchen levothyroxine (SYNTHROID, LEVOTHROID) 25 MCG tablet Take 25 mcg by mouth daily before breakfast.    . LORazepam (ATIVAN) 0.5 MG tablet Take 1 mg by mouth 2 (two) times daily.    Marland Kitchen losartan-hydrochlorothiazide (HYZAAR) 100-25 MG per tablet Take 1 tablet by mouth daily.    . ondansetron (ZOFRAN-ODT) 4 MG disintegrating  tablet Take 4 mg by mouth every 8 (eight) hours as needed for nausea or vomiting.    . potassium chloride SA (K-DUR,KLOR-CON) 20 MEQ tablet Take 10 mEq by mouth daily.    . ranitidine (ZANTAC) 150 MG tablet Take 150 mg by mouth 2 (two) times daily.    . traZODone (DESYREL) 50 MG tablet Take 50 mg by mouth at bedtime as needed for sleep.     No current facility-administered medications for this visit.    Allergies:   Penicillins; Iohexol; and Nsaids   Social History:  The patient  reports that she has never smoked. She does not have any smokeless tobacco history on file. She reports that she does not drink alcohol or use illicit drugs.   Family History:  The patient's family history includes Breast cancer in her mother; Hyperlipidemia in her father  and sister; Hypertension in her father and sister.    ROS:  Please see the history of present illness.   Otherwise, review of systems is positive for balance problems, depression.   All other systems are reviewed and negative.    PHYSICAL EXAM: VS:  BP 126/94 mmHg  Pulse 85  Ht 5' (1.524 m)  Wt 160 lb 6.4 oz (72.757 kg)  BMI 31.33 kg/m2 , BMI Body mass index is 31.33 kg/(m^2). GEN: Well nourished, well developed, in no acute distress HEENT: normal Neck: no JVD, carotid bruits, or masses Cardiac: RRR; no murmurs, rubs, or gallops,no edema  Respiratory:  clear to auscultation bilaterally, normal work of breathing GI: soft, nontender, nondistended, + BS MS: no deformity or atrophy Skin: warm and dry Neuro:  Strength and sensation are intact Psych: euthymic mood, full affect  EKG:  EKG is ordered today. The ekg ordered today shows sinus rhythm, rate 85, QTc 485  Recent Labs: No results found for requested labs within last 365 days.    Lipid Panel     Component Value Date/Time   CHOL  12/17/2009 2355    141        ATP III CLASSIFICATION:  <200     mg/dL   Desirable  200-239  mg/dL   Borderline High  >=240    mg/dL   High          TRIG 135 12/17/2009 2355   HDL 40 12/17/2009 2355   CHOLHDL 3.5 12/17/2009 2355   VLDL 27 12/17/2009 2355   LDLCALC  12/17/2009 2355    74        Total Cholesterol/HDL:CHD Risk Coronary Heart Disease Risk Table                     Men   Women  1/2 Average Risk   3.4   3.3  Average Risk       5.0   4.4  2 X Average Risk   9.6   7.1  3 X Average Risk  23.4   11.0        Use the calculated Patient Ratio above and the CHD Risk Table to determine the patient's CHD Risk.        ATP III CLASSIFICATION (LDL):  <100     mg/dL   Optimal  100-129  mg/dL   Near or Above                    Optimal  130-159  mg/dL   Borderline  160-189  mg/dL   High  >190     mg/dL  Very High     Wt Readings from Last 3 Encounters:  09/18/15 160 lb 6.4 oz  (72.757 kg)  03/26/13 179 lb (81.194 kg)      Other studies Reviewed: Additional studies/ records that were reviewed today include: PCP notes    ASSESSMENT AND PLAN:  1.  Long QTc: currently, her QTc interval measures 485 ms. In speaking with her, it does not sound like her episodes of falls are related to long QTC and polymorphic ventricular tachycardia. She does not have palpitations, and she feels well during the episodes. She says that she  Simply feels like her legs are moving too fast.  No family history of unexplained syncope or sudden death.    Current medicines are reviewed at length with the patient today.   The patient does not have concerns regarding her medicines.  The following changes were made today:  none  Labs/ tests ordered today include:  No orders of the defined types were placed in this encounter.     Disposition:   FU with Jaki Hammerschmidt as needed  Signed, Williemae Muriel Meredith Leeds, MD  09/18/2015 3:24 PM     [address] [place] [address] [phone] (office) [phone] (fax)

## 2015-09-18 NOTE — Patient Instructions (Addendum)
Medication Instructions:  Your physician recommends that you continue on your current medications as directed. Please refer to the Current Medication list given to you today.  * If you need a refill on your cardiac medications before your next appointment, please call your pharmacy.   Labwork: None ordered  Testing/Procedures: None ordered  Follow-Up: No follow up is needed at this time with Dr. Camnitz.  He will see you on an as needed basis.   Thank you for choosing [facility]!!   Randall Rampersad, RN [phone]     

## 2015-09-20 ENCOUNTER — Other Ambulatory Visit: Payer: BLUE CROSS/BLUE SHIELD

## 2015-09-20 ENCOUNTER — Telehealth: Payer: Self-pay | Admitting: Neurology

## 2015-09-20 NOTE — Telephone Encounter (Signed)
Spoke with Becky [name] at Dr. Eddie Dibbles office, she stated that they had the patient authorized 3 separate times for the MRI apt and she had rescheduled every time. Insurance will no longer approve it through Dr. Eddie Dibbles office because the patient has required the auth be changed so many times it is no longer considered urgent. Becky [name] stated that she had given this information to the patient numerous times and told the patient her MRI apt had been canceled but she showed up for the apt anyway. The patient stated that she "may not have remembered because she is having some memory issues". Patient will have to be seen by Dr. Leonie [name] and he can decide if an MRI needs to be ordered by our office.

## 2015-09-20 NOTE — Telephone Encounter (Signed)
Tried to call patient to talk to her about why she cant have MRI until after she sees Dr. Leonie Man but she did not answer. Left a VM asking her to return my call.

## 2015-09-20 NOTE — Telephone Encounter (Signed)
Rn call patient back about [facility] doing the PA for MRI. Pt has a new appt with Dr.Sethi on 10/01/2015 for memory and off balance issues. Dr .Rex Kras referred the patient to [facility] and order the MRI. PT stated she went to [place] and they told her Dr. Rex Kras office did not do a PA. PT stated she call Dr. Rex Kras office and they told her it falls back on Dr. Leonie Man. Rn explain she has a new patient appt, and Dr. Rex Kras office needs to do the PA. Rn explain Dr. Rex Kras office order the MRI in 08/2015, and his office needs to do the PA. Rn spoke with Dentist and she stated that Dr. Rex Kras office needs to get the PA for the MRI. Pt verbalized understanding.

## 2015-09-20 NOTE — Telephone Encounter (Signed)
Met with the patient in office and scheduled her MRI at [place]. Called Dr. Eddie Dibbles office to let them know that they needed to obtain authorization. Beth who handles authorizations was on lunch but will call me back at 2:00.

## 2015-09-20 NOTE — Telephone Encounter (Signed)
Patient called requesting Prior Auth for MRI that was originally ordered by Dr. Rex Kras, states she was scheduled for MRI today at 10:30am was advised by [facility] [address] that patient does not have MRI appointment because Prior Auth was not received. Patient called Dr. Eddie Dibbles office and was advised that she would need to call our office for PA. Patient is scheduled for New Patient appointment with Dr. Leonie Man Monday July 31st.

## 2015-09-28 ENCOUNTER — Other Ambulatory Visit: Payer: BLUE CROSS/BLUE SHIELD

## 2015-10-01 ENCOUNTER — Ambulatory Visit (INDEPENDENT_AMBULATORY_CARE_PROVIDER_SITE_OTHER): Payer: BLUE CROSS/BLUE SHIELD | Admitting: Neurology

## 2015-10-01 ENCOUNTER — Encounter: Payer: Self-pay | Admitting: Neurology

## 2015-10-01 VITALS — BP 155/88 | Temp 85.0°F | Ht 60.0 in | Wt 163.8 lb

## 2015-10-01 DIAGNOSIS — R296 Repeated falls: Secondary | ICD-10-CM | POA: Diagnosis not present

## 2015-10-01 DIAGNOSIS — G3184 Mild cognitive impairment, so stated: Secondary | ICD-10-CM

## 2015-10-01 HISTORY — DX: Mild cognitive impairment of uncertain or unknown etiology: G31.84

## 2015-10-01 HISTORY — DX: Repeated falls: R29.6

## 2015-10-01 NOTE — Progress Notes (Signed)
[facility] [address] [place]. [place] [address] [phone]       OFFICE CONSULT NOTE  Becky. Becky [name] Date of Birth:  [birth year] Medical Record Number:  [id number]   Referring MD:  Hulan Fess  Reason for Referral:  Cognitive impairment and balance problems  HPI: Becky [name] is a 49 year lady with long-standing history of anxiety depression who for the last 1 year or so has had progressive memory and cognitive difficulties as well as increased frequency of falls and balance problems. She is accompanied today by a cousin [name]. Patient admits she did have some mild cognitive memory problems even prior to a year but since she got ECT in June 2016 this seems to have been more pronounced. She got only 4 treatments every other day. 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 She gives several examples like going to plan to change direct drops on her checking account but once she reached that she was unable to tell the bank arrest why she had come. On occasion while driving home from a friend's house she could not remember the way home and got lost. 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 She denies any headaches, extremity weakness. She is also had increased frequency of falls and states that she falls without any warning. She does not feel like she didn't pass out or have any syncopal symptoms. She falls all of a sudden. She is able to stop herself sometimes but has bruised herself. She has never lost consciousness. She denies any tingling numbness pain or burning in her feet. She denies walking like a drunk. She has no history of significant head injury with loss of consciousness. She has no family history of dementia. She lives alone she is now getting some help with finances but needs to keep a  calendar calendar to write things down so she does not forget. She has a remote history of tiny (lacunar infarct in  September 2008. MRA of the brain showed only mild atherosclerotic changes She presented at that time the 30 minute episode of right-sided facial numbness and slurred speech. I had seen her in the hospital at that time but she was lost to follow-up. She states she's had no further recurrent stroke or TIA symptoms.  ROS:   14 system review of systems is positive for  fatigue, hearing loss, skin moles, blurred vision, memory loss, confusion, anxiety, depression, decreased energy, disinterest in activities, restless legs and all other systems negative  PMH:  Past Medical History:  Diagnosis Date  . Chronic kidney disease   . Depression    followed by Dr Toy Care  . Genital herpes   . History of melanoma    followed by derm Dr Delman Cheadle  . Hyperlipidemia   . Hypertension   . Stroke [facility])    Old stroke seen on MRI 12/2009, carotid doppler w minimal plaque, MRI off the head otherwise neg for acute disease.  . Thyroid disease    hypothyroid. Followed by Dr Toy Care  . TIA (transient ischemic attack) 8/08   followed by neurologist Dr Leonie Man    Social History:  Social History   Social History  . Marital status: Single    Spouse name: N/A  . Number of children: N/A  . Years of education:  N/A   Occupational History  . Not on file.   Social History Main Topics  . Smoking status: Never Smoker  . Smokeless tobacco: Never Used  . Alcohol use No  . Drug use: No  . Sexual activity: No   Other Topics Concern  . Not on file   Social History Narrative  . No narrative on file    Medications:   Current Outpatient Prescriptions on File Prior to Visit  Medication Sig Dispense Refill  . acetaminophen-codeine (TYLENOL #3) 300-30 MG per tablet Take 1-2 tablets by mouth every 4 (four) hours as needed for moderate pain. 15 tablet 0  . acyclovir (ZOVIRAX) 200 MG capsule Take 200 mg by mouth  5 (five) times daily.    Marland Kitchen aspirin EC 325 MG tablet Take 325 mg by mouth 2 (two) times daily.     Marland Kitchen atorvastatin (LIPITOR) 40 MG tablet Take 80 mg by mouth daily.    . Cholecalciferol (VITAMIN D) 2000 UNITS tablet Take 5,000 Units by mouth daily.     Marland Kitchen ezetimibe (ZETIA) 10 MG tablet Take 10 mg by mouth daily.    Marland Kitchen levothyroxine (SYNTHROID, LEVOTHROID) 25 MCG tablet Take 25 mcg by mouth daily before breakfast.    . LORazepam (ATIVAN) 0.5 MG tablet Take 1 mg by mouth as needed.     Marland Kitchen losartan-hydrochlorothiazide (HYZAAR) 100-25 MG per tablet Take 1 tablet by mouth daily.    . potassium chloride SA (K-DUR,KLOR-CON) 20 MEQ tablet Take 10 mEq by mouth daily.    . ranitidine (ZANTAC) 150 MG tablet Take 150 mg by mouth 2 (two) times daily.    . traZODone (DESYREL) 50 MG tablet Take 50 mg by mouth at bedtime as needed for sleep.     No current facility-administered medications on file prior to visit.     Allergies:   Allergies  Allergen Reactions  . Penicillins Anaphylaxis  . Iohexol      Desc: iodine   . Nsaids     CKD     Physical Exam General: well developed, well nourished, Middle-aged Caucasian lady seated, in no evident distress Head: head normocephalic and atraumatic.   Neck: supple with no carotid or supraclavicular bruits Cardiovascular: regular rate and rhythm, no murmurs Musculoskeletal: no deformity Skin:  no rash/petichiae Vascular:  Normal pulses all extremities  Neurologic Exam Mental Status: Awake and fully alert. Oriented to place and time. Recent and remote memory intact. Attention span, concentration and fund of knowledge appropriate. Mood and affect appropriate. Mini-Mental status exam scored 29/30 with only one deficit in recall. Animal naming test 11. Clock drawing 4/4. Able to copy intersecting pentagons well. Cranial Nerves: Fundoscopic exam reveals sharp disc margins. Pupils equal, briskly reactive to light. Extraocular movements full without nystagmus. Visual  fields full to confrontation. Hearing intact. Facial sensation intact. Face, tongue, palate moves normally and symmetrically.  Motor: Normal bulk and tone. Normal strength in all tested extremity muscles. Sensory.: intact to touch , pinprick , position and vibratory sensation.  Coordination: Rapid alternating movements normal in all extremities. Finger-to-nose and heel-to-shin performed accurately bilaterally. Gait and Station: Arises from chair without difficulty. Stance is normal. Gait demonstrates normal stride length and balance . Able to heel, toe and tandem walk with slight difficulty.  Reflexes: 3+ brisk and symmetric. Toes downgoing.       ASSESSMENT: 24 year lady with long-standing history of anxiety depression with recent cognitive impairment and memory difficulties likely due to mild cognitive impairment. Early dementia is also  possible but less likely. Increase frequency of recent falls with mild brisk hyperreflexia on exam raising concern for cervical myelopathy. Remote history of left brain lacunar infarct in 2008 with vascular risk factors of hypertension hyperlipidemia.    PLAN: I had a long discussion with the patient and her cousin regarding her memory and cognitive difficulties which likely represent mild cognitive impairment. I encouraged her to increase participation in activities like solving crossword puzzles, sudoku and playing bridge and memory compensation strategies.. I recommend she take fish oil and Prevagen daily. Check labs for reversible causes of memory loss, EEG and MRI scan of the brain and cervical spine finding. We also talked about her frequent falls and discussed fall prevention precautions. She will stay on aspirin for stroke prevention and maintain strict control of hypertension with blood pressure goal below 130/90 and lipids with LDL cholesterol goal below 70 mg percent to prevent recurrent strokes. Greater than 50% time during this 45 minute consultation  visit was spent on counseling and coordination of care about her memory loss and cognitive impairment. Return for follow-up in 2 months or call earlier if necessary.               Antony Contras, MD  [facility] [address] [place], [place] [address]  Phone [phone] Fax [phone]  Note: This document was prepared with digital dictation and possible smart phrase technology. Any transcriptional errors that result from this process are unintentional.

## 2015-10-01 NOTE — Patient Instructions (Signed)
I had a long discussion with the patient and Becky [name] cousin regarding Becky [name] memory and cognitive difficulties which likely represent mild cognitive impairment. I encouraged Becky [name] to increase participation in activities like solving crossword puzzles, sudoku and playing bridge and memory compensation strategies.. I recommend she take fish oil and Prevagen daily. Check labs for reversible causes of memory loss, EEG and MRI scan of the brain and cervical spine finding. We also talked about Becky [name] frequent falls and discussed fall prevention precautions. She will stay on aspirin for stroke prevention and maintain strict control of hypertension with blood pressure goal below 130/90 and lipids with LDL cholesterol goal below 70 mg percent to prevent recurrent strokes. Return for follow-up in 2 months or call earlier if necessary.  Memory Compensation Strategies  1. Use "WARM" strategy.  W= write it down  A= associate it  R= repeat it  M= make a mental note  2.   You can keep a Social worker.  Use a 3-ring notebook with sections for the following: calendar, important names and phone numbers,  medications, doctors' names/phone numbers, lists/reminders, and a section to journal what you did  each day.   3.    Use a calendar to write appointments down.  4.    Write yourself a schedule for the day.  This can be placed on the calendar or in a separate section of the Memory Notebook.  Keeping a  regular schedule can help memory.  5.    Use medication organizer with sections for each day or morning/evening pills.  You may need help loading it  6.    Keep a basket, or pegboard by the door.  Place items that you need to take out with you in the basket or on the pegboard.  You may also want to  include a message board for reminders.  7.    Use sticky notes.  Place sticky notes with reminders in a place where the task is performed.  For example: " turn off the  stove" placed by the stove, "lock the door" placed on the  door at eye level, " take your medications" on  the bathroom mirror or by the place where you normally take your medications.  8.    Use alarms/timers.  Use while cooking to remind yourself to check on food or as a reminder to take your medicine, or as a  reminder to make a call, or as a reminder to perform another task, etc.   Fall Prevention in the Home  Falls can cause injuries. They can happen to people of all ages. There are many things you can do to make your home safe and to help prevent falls.  WHAT CAN I DO ON THE OUTSIDE OF MY HOME?  Regularly fix the edges of walkways and driveways and fix any cracks.  Remove anything that might make you trip as you walk through a door, such as a raised step or threshold.  Trim any bushes or trees on the path to your home.  Use bright outdoor lighting.  Clear any walking paths of anything that might make someone trip, such as rocks or tools.  Regularly check to see if handrails are loose or broken. Make sure that both sides of any steps have handrails.  Any raised decks and porches should have guardrails on the edges.  Have any leaves, snow, or ice cleared regularly.  Use sand or salt on walking paths during winter.  Clean up any spills in  your garage right away. This includes oil or grease spills. WHAT CAN I DO IN THE BATHROOM?   Use night lights.  Install grab bars by the toilet and in the tub and shower. Do not use towel bars as grab bars.  Use non-skid mats or decals in the tub or shower.  If you need to sit down in the shower, use a plastic, non-slip stool.  Keep the floor dry. Clean up any water that spills on the floor as soon as it happens.  Remove soap buildup in the tub or shower regularly.  Attach bath mats securely with double-sided non-slip rug tape.  Do not have throw rugs and other things on the floor that can make you trip. WHAT CAN I DO IN THE BEDROOM?  Use night lights.  Make sure that you have a light by  your bed that is easy to reach.  Do not use any sheets or blankets that are too big for your bed. They should not hang down onto the floor.  Have a firm chair that has side arms. You can use this for support while you get dressed.  Do not have throw rugs and other things on the floor that can make you trip. WHAT CAN I DO IN THE KITCHEN?  Clean up any spills right away.  Avoid walking on wet floors.  Keep items that you use a lot in easy-to-reach places.  If you need to reach something above you, use a strong step stool that has a grab bar.  Keep electrical cords out of the way.  Do not use floor polish or wax that makes floors slippery. If you must use wax, use non-skid floor wax.  Do not have throw rugs and other things on the floor that can make you trip. WHAT CAN I DO WITH MY STAIRS?  Do not leave any items on the stairs.  Make sure that there are handrails on both sides of the stairs and use them. Fix handrails that are broken or loose. Make sure that handrails are as long as the stairways.  Check any carpeting to make sure that it is firmly attached to the stairs. Fix any carpet that is loose or worn.  Avoid having throw rugs at the top or bottom of the stairs. If you do have throw rugs, attach them to the floor with carpet tape.  Make sure that you have a light switch at the top of the stairs and the bottom of the stairs. If you do not have them, ask someone to add them for you. WHAT ELSE CAN I DO TO HELP PREVENT FALLS?  Wear shoes that:  Do not have high heels.  Have rubber bottoms.  Are comfortable and fit you well.  Are closed at the toe. Do not wear sandals.  If you use a stepladder:  Make sure that it is fully opened. Do not climb a closed stepladder.  Make sure that both sides of the stepladder are locked into place.  Ask someone to hold it for you, if possible.  Clearly mark and make sure that you can see:  Any grab bars or handrails.  First and  last steps.  Where the edge of each step is.  Use tools that help you move around (mobility aids) if they are needed. These include:  Canes.  Walkers.  Scooters.  Crutches.  Turn on the lights when you go into a dark area. Replace any light bulbs as soon as they burn out.  Set up your furniture so you have a clear path. Avoid moving your furniture around.  If any of your floors are uneven, fix them.  If there are any pets around you, be aware of where they are.  Review your medicines with your doctor. Some medicines can make you feel dizzy. This can increase your chance of falling. Ask your doctor what other things that you can do to help prevent falls.   This information is not intended to replace advice given to you by your health care provider. Make sure you discuss any questions you have with your health care provider.   Document Released: 12/14/2008 Document Revised: 07/04/2014 Document Reviewed: 03/24/2014 Elsevier Interactive Patient Education [employer].

## 2015-10-02 LAB — DEMENTIA PANEL
Homocysteine: 11.6 umol/L (ref 0.0–15.0)
RPR Ser Ql: NONREACTIVE
TSH: 0.881 u[IU]/mL (ref 0.450–4.500)
Vitamin B-12: 623 pg/mL (ref 211–946)

## 2015-10-09 ENCOUNTER — Telehealth: Payer: Self-pay | Admitting: *Deleted

## 2015-10-09 NOTE — Telephone Encounter (Signed)
Per Dr Leonie Man, LVM informing patient her lab results for reversible causes of memory loss were normal. Left name, number for questions.

## 2015-10-10 ENCOUNTER — Ambulatory Visit (INDEPENDENT_AMBULATORY_CARE_PROVIDER_SITE_OTHER): Payer: BLUE CROSS/BLUE SHIELD

## 2015-10-10 DIAGNOSIS — R410 Disorientation, unspecified: Secondary | ICD-10-CM | POA: Diagnosis not present

## 2015-10-10 DIAGNOSIS — G3184 Mild cognitive impairment, so stated: Secondary | ICD-10-CM

## 2015-10-15 ENCOUNTER — Telehealth: Payer: Self-pay | Admitting: Cardiology

## 2015-10-15 MED ORDER — NADOLOL 20 MG PO TABS: 20.0000 mg | ORAL_TABLET | Freq: Every day | ORAL | 3 refills | Status: DC

## 2015-10-15 NOTE — Telephone Encounter (Signed)
QTc has been increased on prior ECGs.  Can start nadalol 20 mg daily.  Unlikely prolonged QTc is the cause of her falls.

## 2015-10-15 NOTE — Telephone Encounter (Signed)
Called patient with Dr. Curt Bears response. Sent prescription in for Nadolol 20 mg by mouth daily to patient's pharmacy of choice.

## 2015-10-15 NOTE — Telephone Encounter (Signed)
Spoke with patient who states she saw Dr. Curt Bears on 7/18 and she was expecting a call back from him after he reviewed her records.  I reviewed his office visit and asked her if she was confusing her visit with him with the neurology visit with Dr. Leonie Man.  She states it was definitely Dr. Curt Bears and she had confused EEG with EKG.  I advised that I will send message to Dr. Curt Bears for review.  I advised someone from our office will call her back this week with his advice and she verbalized understanding and agreement.

## 2015-10-15 NOTE — Telephone Encounter (Signed)
New message       Pt was seen 09-18-15.  Pt states the doctor was going to go over her previous records and the EEG results and let her know what the next step would be.  She has not heard from [place].  Please call

## 2015-10-19 ENCOUNTER — Ambulatory Visit
Admission: RE | Admit: 2015-10-19 | Discharge: 2015-10-19 | Disposition: A | Discharge status: Home / Self Care | Payer: BLUE CROSS/BLUE SHIELD | Source: Ambulatory Visit | Attending: Neurology | Admitting: Neurology

## 2015-10-19 DIAGNOSIS — R296 Repeated falls: Secondary | ICD-10-CM | POA: Diagnosis not present

## 2015-10-19 DIAGNOSIS — G3184 Mild cognitive impairment, so stated: Secondary | ICD-10-CM | POA: Diagnosis not present

## 2015-11-13 ENCOUNTER — Telehealth: Payer: Self-pay

## 2015-11-13 NOTE — Telephone Encounter (Signed)
Rn spoke with patient about MRI cervical spine. Rn stated per Dr. Leonie Man note Becky Fila, Becky [name]  You 7 hours ago (8:22 AM)    MRI scan of the cervical spine shows only minor wear and tear changes between the sixth and the seventh cervical vertebrae. No pinch nerve or anything major to worry about.   Documentation    Pt verbalized understanding. Pt would like a copy sent to Dr. Rex Kras her PCP.

## 2015-11-13 NOTE — Telephone Encounter (Signed)
MRI scan of the cervical spine shows only minor wear and tear changes between the sixth and the seventh cervical vertebrae. No pinch nerve or anything major to worry about.

## 2015-11-13 NOTE — Telephone Encounter (Signed)
Pt would like results of MRI cervical spine.

## 2015-11-13 NOTE — Telephone Encounter (Signed)
LFt vm for patient to call back about MRi cervical spine results.

## 2015-11-21 ENCOUNTER — Other Ambulatory Visit: Payer: Self-pay | Admitting: Nephrology

## 2015-11-21 DIAGNOSIS — N183 Chronic kidney disease, stage 3 unspecified: Secondary | ICD-10-CM

## 2015-11-21 DIAGNOSIS — Z87442 Personal history of urinary calculi: Secondary | ICD-10-CM

## 2015-11-21 DIAGNOSIS — I129 Hypertensive chronic kidney disease with stage 1 through stage 4 chronic kidney disease, or unspecified chronic kidney disease: Secondary | ICD-10-CM

## 2015-12-04 ENCOUNTER — Other Ambulatory Visit: Payer: BLUE CROSS/BLUE SHIELD

## 2015-12-10 ENCOUNTER — Other Ambulatory Visit: Payer: BLUE CROSS/BLUE SHIELD

## 2015-12-13 ENCOUNTER — Ambulatory Visit: Payer: BLUE CROSS/BLUE SHIELD | Admitting: Neurology

## 2016-01-02 DIAGNOSIS — F3181 Bipolar II disorder: Secondary | ICD-10-CM | POA: Diagnosis not present

## 2016-01-08 DIAGNOSIS — M9905 Segmental and somatic dysfunction of pelvic region: Secondary | ICD-10-CM | POA: Diagnosis not present

## 2016-01-08 DIAGNOSIS — M5134 Other intervertebral disc degeneration, thoracic region: Secondary | ICD-10-CM | POA: Diagnosis not present

## 2016-01-08 DIAGNOSIS — M9903 Segmental and somatic dysfunction of lumbar region: Secondary | ICD-10-CM | POA: Diagnosis not present

## 2016-01-08 DIAGNOSIS — M5431 Sciatica, right side: Secondary | ICD-10-CM | POA: Diagnosis not present

## 2016-01-08 DIAGNOSIS — M5136 Other intervertebral disc degeneration, lumbar region: Secondary | ICD-10-CM | POA: Diagnosis not present

## 2016-01-08 DIAGNOSIS — M9902 Segmental and somatic dysfunction of thoracic region: Secondary | ICD-10-CM | POA: Diagnosis not present

## 2016-01-10 ENCOUNTER — Ambulatory Visit (INDEPENDENT_AMBULATORY_CARE_PROVIDER_SITE_OTHER): Payer: Medicare Other | Admitting: Neurology

## 2016-01-10 ENCOUNTER — Encounter: Payer: Self-pay | Admitting: Neurology

## 2016-01-10 VITALS — BP 133/77 | HR 66 | Ht 60.0 in | Wt 169.6 lb

## 2016-01-10 DIAGNOSIS — G3184 Mild cognitive impairment, so stated: Secondary | ICD-10-CM | POA: Diagnosis not present

## 2016-01-10 NOTE — Patient Instructions (Addendum)

## 2016-01-10 NOTE — Progress Notes (Signed)
[facility] [address] [place]. [place] [address] [phone]       OFFICE CONSULT NOTE  Becky. Becky [name] Date of Birth:  Jun 10, 1950 Medical Record Number:  [id number]   Referring MD:  Hulan Fess  Reason for Referral:  Cognitive impairment and balance problems  HPI: Initial Consult 10/01/15 :  Becky [name] is a 59 year lady with long-standing history of anxiety depression who for the last 1 year or so has had progressive memory and cognitive difficulties as well as increased frequency of falls and balance problems. She is accompanied today by a cousin Becky [name]. Patient admits she did have some mild cognitive memory problems even prior to a year but since she got ECT in June 2016 this seems to have been more pronounced. She got only 4 treatments every other day. 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 She gives several examples like going to plan to change direct drops on her checking account but once she reached that she was unable to tell the bank arrest why she had come. On occasion while driving home from a friend's house she could not remember the way home and got lost. 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 She denies any headaches, extremity weakness. She is also had increased frequency of falls and states that she falls without any warning. She does not feel like she didn't pass out or have any syncopal symptoms. She falls all of a sudden. She is able to stop herself sometimes but has bruised herself. She has never lost consciousness. She denies any tingling numbness pain or burning in her feet. She denies walking like a drunk. She has no history of significant head injury with loss of consciousness. She has no family history of dementia. She lives alone she is now getting some help with  finances but needs to keep a calendar calendar to write things down so she does not forget. She has a remote history of tiny (lacunar infarct in  September 2008. MRA of the brain showed only mild atherosclerotic changes She presented at that time the 30 minute episode of right-sided facial numbness and slurred speech. I had seen her in the hospital at that time but she was lost to follow-up. She states she's had no further recurrent stroke or TIA symptoms. Update 01/10/2016 : She returns for follow-up after last visit 3 months ago. She is accompanied by her friend Becky [name]. Patient states her short-term memory and cognitive difficulties appeared unchanged. She needs constant reminders and needs to write things down. She still independent demented her own affairs. Patient feels that she is feeling a little better after she has cut down some of her medications for anxiety and sleep. She is more alert and more interactive with her surroundings. She had MRI scan of the brain done on 10/2115 which have personally reviewed showed old left frontal infarct with encephalomalacia as well as mild changes of small vessel disease. No acute abnormality was noted. MRI scan cervical spine showed mild degenerative changes mainly at C6-7 but no definite compression is severe spinal stenosis. Lab work for reversible causes of memory loss and 10/01/15 were all normal including B12, TSH, RPR and homocysteine. EEG done on 8/9 for 17 was also normal. Patient has been taking fish oil but has  not been participating in cognitively challenging activities. Patient does feel that she may have Alzheimer's.  ROS:   14 system review of systems is positive for  restless legs, memory loss, confusion, depression and all other systems negative  PMH:  Past Medical History:  Diagnosis Date  . Chronic kidney disease   . Depression    followed by Dr Toy Care  . Genital herpes   . History of melanoma    followed by derm Dr Delman Cheadle  .  Hyperlipidemia   . Hypertension   . Stroke [facility])    Old stroke seen on MRI 12/2009, carotid doppler w minimal plaque, MRI off the head otherwise neg for acute disease.  . Thyroid disease    hypothyroid. Followed by Dr Toy Care  . TIA (transient ischemic attack) 8/08   followed by neurologist Dr Leonie Man    Social History:  Social History   Social History  . Marital status: Single    Spouse name: N/A  . Number of children: N/A  . Years of education: N/A   Occupational History  . Not on file.   Social History Main Topics  . Smoking status: Never Smoker  . Smokeless tobacco: Never Used  . Alcohol use No  . Drug use: No  . Sexual activity: No   Other Topics Concern  . Not on file   Social History Narrative  . No narrative on file    Medications:   Current Outpatient Prescriptions on File Prior to Visit  Medication Sig Dispense Refill  . acetaminophen-codeine (TYLENOL #3) 300-30 MG per tablet Take 1-2 tablets by mouth every 4 (four) hours as needed for moderate pain. 15 tablet 0  . acyclovir (ZOVIRAX) 200 MG capsule Take 200 mg by mouth 5 (five) times daily.    Marland Kitchen ALPRAZolam (XANAX) 0.25 MG tablet 3 (three) times daily as needed.   1  . aspirin EC 325 MG tablet Take 325 mg by mouth 2 (two) times daily.     . Cholecalciferol (VITAMIN D) 2000 UNITS tablet Take 5,000 Units by mouth daily.     Marland Kitchen ezetimibe (ZETIA) 10 MG tablet Take 10 mg by mouth daily.    Marland Kitchen levothyroxine (SYNTHROID, LEVOTHROID) 25 MCG tablet Take 25 mcg by mouth daily before breakfast.    . LORazepam (ATIVAN) 0.5 MG tablet Take 1 mg by mouth as needed.     Marland Kitchen losartan-hydrochlorothiazide (HYZAAR) 100-25 MG per tablet Take 1 tablet by mouth daily.    . nadolol (CORGARD) 20 MG tablet Take 1 tablet (20 mg total) by mouth daily. 90 tablet 3  . potassium chloride SA (K-DUR,KLOR-CON) 20 MEQ tablet Take 10 mEq by mouth daily.    . ranitidine (ZANTAC) 150 MG tablet Take 150 mg by mouth 2 (two) times daily.    . traZODone  (DESYREL) 50 MG tablet Take 50 mg by mouth at bedtime as needed for sleep.     No current facility-administered medications on file prior to visit.     Allergies:   Allergies  Allergen Reactions  . Penicillins Anaphylaxis  . Iodinated Diagnostic Agents Other (See Comments)  . Iohexol      Desc: iodine   . Nsaids     CKD     Physical Exam General: well developed, well nourished, Middle-aged Caucasian lady seated, in no evident distress Head: head normocephalic and atraumatic.   Neck: supple with no carotid or supraclavicular bruits Cardiovascular: regular rate and rhythm, no murmurs Musculoskeletal: no deformity Skin:  no rash/petichiae Vascular:  Normal pulses all extremities  Neurologic Exam Mental Status: Awake and fully alert. Oriented to place and time. Recent and remote memory intact. Attention span, concentration and fund of knowledge appropriate. Mood and affect appropriate. Mini-Mental status not done(last visit 29/30 )  Animal naming test 8. Clock drawing 4/4.   Cranial Nerves: Fundoscopic exam not done. Pupils equal, briskly reactive to light. Extraocular movements full without nystagmus. Visual fields full to confrontation. Hearing intact. Facial sensation intact. Face, tongue, palate moves normally and symmetrically.  Motor: Normal bulk and tone. Normal strength in all tested extremity muscles. Sensory.: intact to touch , pinprick , position and vibratory sensation.  Coordination: Rapid alternating movements normal in all extremities. Finger-to-nose and heel-to-shin performed accurately bilaterally. Gait and Station: Arises from chair without difficulty. Stance is normal. Gait demonstrates normal stride length and balance . Able to heel, toe and tandem walk with slight difficulty.  Reflexes: 3+ brisk and symmetric. Toes downgoing.       ASSESSMENT: 34 year lady with long-standing history of anxiety depression with recent cognitive impairment and memory difficulties  likely due to mild cognitive impairment. Early dementia is  less likely.  nt   Remote history of left brain lacunar infarct in 2008 with vascular risk factors of hypertension hyperlipidemia.    PLAN: I had a long discussion with the patient and her friend Becky [name] about her mild cognitive impairment and the risk for progressing to early dementia and answered questions. I again encouraged her to pulse rate in cognitively challenging activities like solving crossword puzzles, sudoku and playing bridge. Advise her to continue fish oil and also tried taking over-the-counter memory supplements like either Prevagen or Resevetarol. She may also consider possible participation in [place] early Nance trial if interested and given information to review at home and decide. Greater than 50% time during this 25 minute visit was spent on counseling and coordination of care about Becky [name] memory impairment and Alzheimer's She will otherwise return for follow-up in 6 months or call earlier if necessary.               Antony Contras, MD  [facility] [address] [place], [place] [address]  Phone [phone] Fax [phone]  Note: This document was prepared with digital dictation and possible smart phrase technology. Any transcriptional errors that result from this process are unintentional.

## 2016-01-14 DIAGNOSIS — M9905 Segmental and somatic dysfunction of pelvic region: Secondary | ICD-10-CM | POA: Diagnosis not present

## 2016-01-14 DIAGNOSIS — M5134 Other intervertebral disc degeneration, thoracic region: Secondary | ICD-10-CM | POA: Diagnosis not present

## 2016-01-14 DIAGNOSIS — M5136 Other intervertebral disc degeneration, lumbar region: Secondary | ICD-10-CM | POA: Diagnosis not present

## 2016-01-14 DIAGNOSIS — M9902 Segmental and somatic dysfunction of thoracic region: Secondary | ICD-10-CM | POA: Diagnosis not present

## 2016-01-14 DIAGNOSIS — M9903 Segmental and somatic dysfunction of lumbar region: Secondary | ICD-10-CM | POA: Diagnosis not present

## 2016-01-14 DIAGNOSIS — M5431 Sciatica, right side: Secondary | ICD-10-CM | POA: Diagnosis not present

## 2016-01-15 DIAGNOSIS — M9902 Segmental and somatic dysfunction of thoracic region: Secondary | ICD-10-CM | POA: Diagnosis not present

## 2016-01-15 DIAGNOSIS — M5431 Sciatica, right side: Secondary | ICD-10-CM | POA: Diagnosis not present

## 2016-01-15 DIAGNOSIS — M9905 Segmental and somatic dysfunction of pelvic region: Secondary | ICD-10-CM | POA: Diagnosis not present

## 2016-01-15 DIAGNOSIS — M9903 Segmental and somatic dysfunction of lumbar region: Secondary | ICD-10-CM | POA: Diagnosis not present

## 2016-01-15 DIAGNOSIS — M5136 Other intervertebral disc degeneration, lumbar region: Secondary | ICD-10-CM | POA: Diagnosis not present

## 2016-01-15 DIAGNOSIS — M5134 Other intervertebral disc degeneration, thoracic region: Secondary | ICD-10-CM | POA: Diagnosis not present

## 2016-01-21 DIAGNOSIS — F3181 Bipolar II disorder: Secondary | ICD-10-CM | POA: Diagnosis not present

## 2016-01-22 DIAGNOSIS — M5431 Sciatica, right side: Secondary | ICD-10-CM | POA: Diagnosis not present

## 2016-01-22 DIAGNOSIS — M9905 Segmental and somatic dysfunction of pelvic region: Secondary | ICD-10-CM | POA: Diagnosis not present

## 2016-01-22 DIAGNOSIS — M9902 Segmental and somatic dysfunction of thoracic region: Secondary | ICD-10-CM | POA: Diagnosis not present

## 2016-01-22 DIAGNOSIS — M5136 Other intervertebral disc degeneration, lumbar region: Secondary | ICD-10-CM | POA: Diagnosis not present

## 2016-01-22 DIAGNOSIS — M5134 Other intervertebral disc degeneration, thoracic region: Secondary | ICD-10-CM | POA: Diagnosis not present

## 2016-01-22 DIAGNOSIS — M9903 Segmental and somatic dysfunction of lumbar region: Secondary | ICD-10-CM | POA: Diagnosis not present

## 2016-02-05 DIAGNOSIS — J305 Allergic rhinitis due to food: Secondary | ICD-10-CM | POA: Diagnosis not present

## 2016-02-05 DIAGNOSIS — B370 Candidal stomatitis: Secondary | ICD-10-CM | POA: Diagnosis not present

## 2016-02-05 DIAGNOSIS — K121 Other forms of stomatitis: Secondary | ICD-10-CM | POA: Diagnosis not present

## 2016-02-13 DIAGNOSIS — B370 Candidal stomatitis: Secondary | ICD-10-CM | POA: Diagnosis not present

## 2016-02-13 DIAGNOSIS — D3702 Neoplasm of uncertain behavior of tongue: Secondary | ICD-10-CM | POA: Diagnosis not present

## 2016-02-13 DIAGNOSIS — K1379 Other lesions of oral mucosa: Secondary | ICD-10-CM | POA: Diagnosis not present

## 2016-03-26 DIAGNOSIS — F3181 Bipolar II disorder: Secondary | ICD-10-CM | POA: Diagnosis not present

## 2016-04-09 DIAGNOSIS — F3181 Bipolar II disorder: Secondary | ICD-10-CM | POA: Diagnosis not present

## 2016-04-24 DIAGNOSIS — F3181 Bipolar II disorder: Secondary | ICD-10-CM | POA: Diagnosis not present

## 2016-04-30 DIAGNOSIS — E7800 Pure hypercholesterolemia, unspecified: Secondary | ICD-10-CM | POA: Diagnosis not present

## 2016-04-30 DIAGNOSIS — Z79899 Other long term (current) drug therapy: Secondary | ICD-10-CM | POA: Diagnosis not present

## 2016-04-30 DIAGNOSIS — R05 Cough: Secondary | ICD-10-CM | POA: Diagnosis not present

## 2016-05-08 DIAGNOSIS — F3181 Bipolar II disorder: Secondary | ICD-10-CM | POA: Diagnosis not present

## 2016-05-20 DIAGNOSIS — F3181 Bipolar II disorder: Secondary | ICD-10-CM | POA: Diagnosis not present

## 2016-06-04 DIAGNOSIS — F3181 Bipolar II disorder: Secondary | ICD-10-CM | POA: Diagnosis not present

## 2016-06-18 DIAGNOSIS — F3181 Bipolar II disorder: Secondary | ICD-10-CM | POA: Diagnosis not present

## 2016-07-02 DIAGNOSIS — D2272 Melanocytic nevi of left lower limb, including hip: Secondary | ICD-10-CM | POA: Diagnosis not present

## 2016-07-02 DIAGNOSIS — L821 Other seborrheic keratosis: Secondary | ICD-10-CM | POA: Diagnosis not present

## 2016-07-02 DIAGNOSIS — D485 Neoplasm of uncertain behavior of skin: Secondary | ICD-10-CM | POA: Diagnosis not present

## 2016-07-02 DIAGNOSIS — D1800 Hemangioma unspecified site: Secondary | ICD-10-CM | POA: Diagnosis not present

## 2016-07-02 DIAGNOSIS — L570 Actinic keratosis: Secondary | ICD-10-CM | POA: Diagnosis not present

## 2016-07-02 DIAGNOSIS — Z85820 Personal history of malignant melanoma of skin: Secondary | ICD-10-CM | POA: Diagnosis not present

## 2016-07-02 DIAGNOSIS — D225 Melanocytic nevi of trunk: Secondary | ICD-10-CM | POA: Diagnosis not present

## 2016-07-02 DIAGNOSIS — Z86018 Personal history of other benign neoplasm: Secondary | ICD-10-CM | POA: Diagnosis not present

## 2016-07-10 ENCOUNTER — Ambulatory Visit: Payer: BLUE CROSS/BLUE SHIELD | Admitting: Neurology

## 2016-08-07 ENCOUNTER — Ambulatory Visit: Payer: BLUE CROSS/BLUE SHIELD | Admitting: Neurology

## 2016-10-14 ENCOUNTER — Other Ambulatory Visit: Payer: Self-pay | Admitting: Cardiology

## 2016-10-29 DIAGNOSIS — L821 Other seborrheic keratosis: Secondary | ICD-10-CM | POA: Diagnosis not present

## 2016-10-29 DIAGNOSIS — D485 Neoplasm of uncertain behavior of skin: Secondary | ICD-10-CM | POA: Diagnosis not present

## 2016-11-05 DIAGNOSIS — Z87442 Personal history of urinary calculi: Secondary | ICD-10-CM | POA: Diagnosis not present

## 2016-11-05 DIAGNOSIS — Z8673 Personal history of transient ischemic attack (TIA), and cerebral infarction without residual deficits: Secondary | ICD-10-CM | POA: Diagnosis not present

## 2016-11-05 DIAGNOSIS — R7301 Impaired fasting glucose: Secondary | ICD-10-CM | POA: Diagnosis not present

## 2016-11-05 DIAGNOSIS — E039 Hypothyroidism, unspecified: Secondary | ICD-10-CM | POA: Diagnosis not present

## 2016-11-05 DIAGNOSIS — F338 Other recurrent depressive disorders: Secondary | ICD-10-CM | POA: Diagnosis not present

## 2016-11-05 DIAGNOSIS — N189 Chronic kidney disease, unspecified: Secondary | ICD-10-CM | POA: Diagnosis not present

## 2016-11-05 DIAGNOSIS — M8580 Other specified disorders of bone density and structure, unspecified site: Secondary | ICD-10-CM | POA: Diagnosis not present

## 2016-11-05 DIAGNOSIS — M899 Disorder of bone, unspecified: Secondary | ICD-10-CM | POA: Diagnosis not present

## 2016-11-05 DIAGNOSIS — Z23 Encounter for immunization: Secondary | ICD-10-CM | POA: Diagnosis not present

## 2016-11-05 DIAGNOSIS — I10 Essential (primary) hypertension: Secondary | ICD-10-CM | POA: Diagnosis not present

## 2016-11-05 DIAGNOSIS — R413 Other amnesia: Secondary | ICD-10-CM | POA: Diagnosis not present

## 2016-11-05 DIAGNOSIS — Z0000 Encounter for general adult medical examination without abnormal findings: Secondary | ICD-10-CM | POA: Diagnosis not present

## 2016-11-10 DIAGNOSIS — I10 Essential (primary) hypertension: Secondary | ICD-10-CM | POA: Diagnosis not present

## 2016-11-10 DIAGNOSIS — Z87442 Personal history of urinary calculi: Secondary | ICD-10-CM | POA: Diagnosis not present

## 2016-11-10 DIAGNOSIS — Z0000 Encounter for general adult medical examination without abnormal findings: Secondary | ICD-10-CM | POA: Diagnosis not present

## 2016-11-10 DIAGNOSIS — E039 Hypothyroidism, unspecified: Secondary | ICD-10-CM | POA: Diagnosis not present

## 2016-11-10 DIAGNOSIS — Z8673 Personal history of transient ischemic attack (TIA), and cerebral infarction without residual deficits: Secondary | ICD-10-CM | POA: Diagnosis not present

## 2016-11-10 DIAGNOSIS — Z86010 Personal history of colonic polyps: Secondary | ICD-10-CM | POA: Diagnosis not present

## 2016-11-10 DIAGNOSIS — R413 Other amnesia: Secondary | ICD-10-CM | POA: Diagnosis not present

## 2016-11-10 DIAGNOSIS — Z23 Encounter for immunization: Secondary | ICD-10-CM | POA: Diagnosis not present

## 2016-11-10 DIAGNOSIS — R7301 Impaired fasting glucose: Secondary | ICD-10-CM | POA: Diagnosis not present

## 2016-11-10 DIAGNOSIS — E559 Vitamin D deficiency, unspecified: Secondary | ICD-10-CM | POA: Diagnosis not present

## 2016-11-10 DIAGNOSIS — M8580 Other specified disorders of bone density and structure, unspecified site: Secondary | ICD-10-CM | POA: Diagnosis not present

## 2016-11-10 DIAGNOSIS — Z85820 Personal history of malignant melanoma of skin: Secondary | ICD-10-CM | POA: Diagnosis not present

## 2016-11-20 ENCOUNTER — Telehealth: Payer: Self-pay | Admitting: Cardiology

## 2016-11-20 MED ORDER — NADOLOL 20 MG PO TABS: 20.0000 mg | ORAL_TABLET | Freq: Every day | ORAL | 0 refills | Status: DC

## 2016-11-20 NOTE — Telephone Encounter (Signed)
New message      *STAT* If patient is at the pharmacy, call can be transferred to refill team.   1. Which medications need to be refilled? (please list name of each medication and dose if known)   nadolol (CORGARD) 20 MG tablet TAKE ONE TABLET BY MOUTH ONCE DAILY     2. Which pharmacy/location (including street and city if local pharmacy) is medication to be sent to?[facility]   3. Do they need a 30 day or 90 day supply? [place]

## 2016-11-20 NOTE — Telephone Encounter (Signed)
Pt's medication was sent to pt's pharmacy as requested. Confirmation received.  °

## 2016-12-30 DIAGNOSIS — N189 Chronic kidney disease, unspecified: Secondary | ICD-10-CM | POA: Insufficient documentation

## 2016-12-30 DIAGNOSIS — E785 Hyperlipidemia, unspecified: Secondary | ICD-10-CM | POA: Insufficient documentation

## 2016-12-30 DIAGNOSIS — A6000 Herpesviral infection of urogenital system, unspecified: Secondary | ICD-10-CM | POA: Insufficient documentation

## 2016-12-30 DIAGNOSIS — F32A Depression, unspecified: Secondary | ICD-10-CM | POA: Insufficient documentation

## 2016-12-30 DIAGNOSIS — E079 Disorder of thyroid, unspecified: Secondary | ICD-10-CM | POA: Insufficient documentation

## 2016-12-30 DIAGNOSIS — Z85820 Personal history of malignant melanoma of skin: Secondary | ICD-10-CM | POA: Insufficient documentation

## 2016-12-30 DIAGNOSIS — I10 Essential (primary) hypertension: Secondary | ICD-10-CM | POA: Insufficient documentation

## 2016-12-30 DIAGNOSIS — F329 Major depressive disorder, single episode, unspecified: Secondary | ICD-10-CM | POA: Insufficient documentation

## 2016-12-30 DIAGNOSIS — N183 Chronic kidney disease, stage 3 unspecified: Secondary | ICD-10-CM | POA: Insufficient documentation

## 2016-12-30 DIAGNOSIS — I639 Cerebral infarction, unspecified: Secondary | ICD-10-CM | POA: Insufficient documentation

## 2017-01-07 ENCOUNTER — Encounter: Payer: Self-pay | Admitting: Neurology

## 2017-01-07 ENCOUNTER — Ambulatory Visit (INDEPENDENT_AMBULATORY_CARE_PROVIDER_SITE_OTHER): Payer: Medicare Other | Admitting: Neurology

## 2017-01-07 VITALS — BP 125/73 | HR 63 | Wt 176.2 lb

## 2017-01-07 DIAGNOSIS — G3184 Mild cognitive impairment, so stated: Secondary | ICD-10-CM | POA: Diagnosis not present

## 2017-01-07 NOTE — Progress Notes (Signed)
[facility] [address] [place]. [place] [address] [phone]       OFFICE FOLLOW UP VISIT NOTE  Becky. Becky [name] Date of Birth:  1950/12/01 Medical Record Number:  [id number]   Referring MD:  Becky [name]  Reason for Referral:  Cognitive impairment and balance problems  HPI: Initial Consult 10/01/15 :  Becky [name] is a 78 year lady with long-standing history of anxiety depression who for the last 1 year or so has had progressive memory and cognitive difficulties as well as increased frequency of falls and balance problems. She is accompanied today by a cousin Becky [name]. Patient admits she did have some mild cognitive memory problems even prior to a year but since she got ECT in June 2016 this seems to have been more pronounced. She got only 4 treatments every other day. 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 She gives several examples like going to plan to change direct drops on her checking account but once she reached that she was unable to tell the bank arrest why she had come. On occasion while driving home from a friend's house she could not remember the way home and got lost. 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 She denies any headaches, extremity weakness. She is also had increased frequency of falls and states that she falls without any warning. She does not feel like she didn't pass out or have any syncopal symptoms. She falls all of a sudden. She is able to stop herself sometimes but has bruised herself. She has never lost consciousness. She denies any tingling numbness pain or burning in her feet. She denies walking like a drunk. She has no history of significant head injury with loss of consciousness. She has no family history of dementia. She lives alone she is now getting some  help with finances but needs to keep a calendar calendar to write things down so she does not forget. She has a remote history of tiny (lacunar infarct in  September 2008. MRA of the brain showed only mild atherosclerotic changes She presented at that time the 30 minute episode of right-sided facial numbness and slurred speech. I had seen her in the hospital at that time but she was lost to follow-up. She states she's had no further recurrent stroke or TIA symptoms. Update 01/10/2016 : She returns for follow-up after last visit 3 months ago. She is accompanied by her friend Becky [name]. Patient states her short-term memory and cognitive difficulties appeared unchanged. She needs constant reminders and needs to write things down. She still independent demented her own affairs. Patient feels that she is feeling a little better after she has cut down some of her medications for anxiety and sleep. She is more alert and more interactive with her surroundings. She had MRI scan of the brain done on 10/2115 which have personally reviewed showed old left frontal infarct with encephalomalacia as well as mild changes of small vessel disease. No acute abnormality was noted. MRI scan cervical spine showed mild degenerative changes mainly at C6-7 but no definite compression is severe spinal stenosis. Lab work for reversible causes of memory loss and 10/01/15 were all normal including B12, TSH, RPR and homocysteine. EEG done on 8/9 for 17 was also normal. Patient has been taking fish oil  but has not been participating in cognitively challenging activities. Patient does feel that she may have Alzheimer's.  Update 01/07/2017 : She returns for follow-up after last visit a year ago.. She states she is doing well she has had no new neurological issues.  She continues to have mild short-term memory difficulties which are unchanged.  She has learned to use a calendar and does keep herself busy with doing card games.  She is still fully  functional and independent managing her own affairs.  She states her blood pressure is quite good today it is 1 2 5/70.  She remains on aspirin which is tolerating well without significant bruising or bleeding.  She is also on Crestor which is tolerating well without muscle aches and pains.  If she denies any new neurological complaints.  She has had no new major medical problems since her last visit. ROS:   14 system review of systems is positive for   memory loss, confusion, depression and all other systems negative  PMH:  Past Medical History:  Diagnosis Date  . Chronic kidney disease   . Depression    followed by Dr Becky [name]  . Genital herpes   . History of melanoma    followed by derm Dr Becky [name]  . Hyperlipidemia   . Hypertension   . Stroke [facility])    Old stroke seen on MRI 12/2009, carotid doppler w minimal plaque, MRI off the head otherwise neg for acute disease.  . Thyroid disease    hypothyroid. Followed by Dr Becky [name]  . TIA (transient ischemic attack) 8/08   followed by neurologist Dr Becky [name]    Social History:  Social History   Socioeconomic History  . Marital status: Single    Spouse name: Not on file  . Number of children: Not on file  . Years of education: Not on file  . Highest education level: Not on file  Social Needs  . Financial resource strain: Not on file  . Food insecurity - worry: Not on file  . Food insecurity - inability: Not on file  . Transportation needs - medical: Not on file  . Transportation needs - non-medical: Not on file  Occupational History  . Not on file  Tobacco Use  . Smoking status: Never Smoker  . Smokeless tobacco: Never Used  Substance and Sexual Activity  . Alcohol use: No  . Drug use: No  . Sexual activity: No  Other Topics Concern  . Not on file  Social History Narrative  . Not on file    Medications:   Current Outpatient Medications on File Prior to Visit  Medication Sig Dispense Refill  . acyclovir (ZOVIRAX) 200 MG capsule  Take 200 mg by mouth 5 (five) times daily.    Marland Kitchen ALPRAZolam (XANAX) 0.25 MG tablet 3 (three) times daily as needed.   1  . amLODipine (NORVASC) 5 MG tablet     . aspirin EC 325 MG tablet Take 325 mg by mouth 2 (two) times daily.     . Cholecalciferol (VITAMIN D) 2000 UNITS tablet Take 5,000 Units by mouth daily.     Marland Kitchen ezetimibe (ZETIA) 10 MG tablet Take 10 mg by mouth daily.    Marland Kitchen FLUoxetine (PROZAC) 40 MG capsule     . levothyroxine (SYNTHROID, LEVOTHROID) 25 MCG tablet Take 25 mcg by mouth daily before breakfast.    . LORazepam (ATIVAN) 0.5 MG tablet Take 1 mg by mouth as needed.     Marland Kitchen losartan-hydrochlorothiazide (HYZAAR) 100-25 MG  per tablet Take 1 tablet by mouth daily.    . nadolol (CORGARD) 20 MG tablet Take 1 tablet (20 mg total) by mouth daily. 90 tablet 0  . potassium chloride SA (K-DUR,KLOR-CON) 20 MEQ tablet Take 10 mEq by mouth daily.    . pramipexole (MIRAPEX) 0.125 MG tablet   11  . ranitidine (ZANTAC) 150 MG tablet Take 150 mg by mouth 2 (two) times daily.    . rosuvastatin (CRESTOR) 10 MG tablet     . traZODone (DESYREL) 50 MG tablet Take 50 mg by mouth at bedtime as needed for sleep.    Marland Kitchen OLANZapine (ZYPREXA) 10 MG tablet      No current facility-administered medications on file prior to visit.     Allergies:   Allergies  Allergen Reactions  . Penicillins Anaphylaxis  . Iodinated Diagnostic Agents Other (See Comments)  . Iohexol      Desc: iodine   . Nsaids     CKD     Physical Exam General: well developed, well nourished, Middle-aged Caucasian lady seated, in no evident distress Head: head normocephalic and atraumatic.   Neck: supple with no carotid or supraclavicular bruits Cardiovascular: regular rate and rhythm, no murmurs Musculoskeletal: no deformity Skin:  no rash/petichiae Vascular:  Normal pulses all extremities  Neurologic Exam Mental Status: Awake and fully alert. Oriented to place and time. Recent and remote memory intact. Attention span,  concentration and fund of knowledge appropriate. Mood and affect appropriate. Mini-Mental status not done(last visit 29/30 )  Animal naming test 10. Clock drawing 4/4.  Recall 2/3. Cranial Nerves: Fundoscopic exam not done. Pupils equal, briskly reactive to light. Extraocular movements full without nystagmus. Visual fields full to confrontation. Hearing intact. Facial sensation intact. Face, tongue, palate moves normally and symmetrically.  Motor: Normal bulk and tone. Normal strength in all tested extremity muscles. Sensory.: intact to touch , pinprick , position and vibratory sensation.  Coordination: Rapid alternating movements normal in all extremities. Finger-to-nose and heel-to-shin performed accurately bilaterally. Gait and Station: Arises from chair without difficulty. Stance is normal. Gait demonstrates normal stride length and balance . Able to heel, toe and tandem walk with slight difficulty.  Reflexes: 3+ brisk and symmetric. Toes downgoing.       ASSESSMENT: 42 year lady with long-standing history of anxiety depression with recent cognitive impairment and memory difficulties likely due to mild cognitive impairment. Versus early dementia     Remote history of left brain lacunar infarct in 2008 with vascular risk factors of hypertension hyperlipidemia.    PLAN: I had a long discussion with the patient regarding her mild memory and cognitive difficulties which appear to be stable.  Recommend she start taking fish oil 1200 mg daily as well as prevention 1 tablet daily.  Continue participation in cognitively challenging activities like solving crossword puzzles, playing sudoku and bridge.  We also talked about memory compensation strategies.  She may also consider possible participation in the [place] early dementia study if interested.  She will be given information to review at home and decide.  She will return for follow-up in a year or call earlier if necessaryGreater than 50% time  during this 25 minute visit was spent on counseling and coordination of [name] about Mild memory impairment and Alzheimer's She will otherwise return for follow-up in 6 months or call earlier if necessary.               Antony Contras, MD  [facility] [place]  [place], [place] [address]  Phone [phone] Fax [phone]  Note: This document was prepared with digital dictation and possible smart phrase technology. Any transcriptional errors that result from this process are unintentional.

## 2017-01-07 NOTE — Patient Instructions (Addendum)
I had a long discussion with the patient regarding her mild memory and cognitive difficulties which appear to be stable.  Recommend she start taking fish oil 1200 mg daily as well as prevention 1 tablet daily.  Continue participation in cognitively challenging activities like solving crossword puzzles, playing sudoku and bridge.  We also talked about memory compensation strategies.  She may also consider possible participation in the [place] early dementia study if interested.  She will be given information to review at home and decide.  She will return for follow-up in a year or call earlier if necessary. Memory Compensation Strategies  1. Use "WARM" strategy.  W= write it down  A= associate it  R= repeat it  M= make a mental note  2.   You can keep a Social worker.  Use a 3-ring notebook with sections for the following: calendar, important names and phone numbers,  medications, doctors' names/phone numbers, lists/reminders, and a section to journal what you did  each day.   3.    Use a calendar to write appointments down.  4.    Write yourself a schedule for the day.  This can be placed on the calendar or in a separate section of the Memory Notebook.  Keeping a  regular schedule can help memory.  5.    Use medication organizer with sections for each day or morning/evening pills.  You may need help loading it  6.    Keep a basket, or pegboard by the door.  Place items that you need to take out with you in the basket or on the pegboard.  You may also want to  include a message board for reminders.  7.    Use sticky notes.  Place sticky notes with reminders in a place where the task is performed.  For example: " turn off the  stove" placed by the stove, "lock the door" placed on the door at eye level, " take your medications" on  the bathroom mirror or by the place where you normally take your medications.  8.    Use alarms/timers.  Use while cooking to remind yourself to check on food  or as a reminder to take your medicine, or as a  reminder to make a call, or as a reminder to perform another task, etc.

## 2017-01-08 DIAGNOSIS — Z1231 Encounter for screening mammogram for malignant neoplasm of breast: Secondary | ICD-10-CM | POA: Diagnosis not present

## 2017-01-08 DIAGNOSIS — M8589 Other specified disorders of bone density and structure, multiple sites: Secondary | ICD-10-CM | POA: Diagnosis not present

## 2017-01-14 ENCOUNTER — Encounter: Payer: Self-pay | Admitting: Cardiology

## 2017-01-14 ENCOUNTER — Ambulatory Visit (INDEPENDENT_AMBULATORY_CARE_PROVIDER_SITE_OTHER): Payer: Medicare Other | Admitting: Cardiology

## 2017-01-14 VITALS — BP 152/90 | HR 63 | Ht 60.0 in | Wt 175.8 lb

## 2017-01-14 DIAGNOSIS — R9431 Abnormal electrocardiogram [ECG] [EKG]: Secondary | ICD-10-CM

## 2017-01-14 DIAGNOSIS — N183 Chronic kidney disease, stage 3 (moderate): Secondary | ICD-10-CM | POA: Diagnosis not present

## 2017-01-14 DIAGNOSIS — R399 Unspecified symptoms and signs involving the genitourinary system: Secondary | ICD-10-CM | POA: Diagnosis not present

## 2017-01-14 DIAGNOSIS — E785 Hyperlipidemia, unspecified: Secondary | ICD-10-CM | POA: Diagnosis not present

## 2017-01-14 DIAGNOSIS — Z87442 Personal history of urinary calculi: Secondary | ICD-10-CM | POA: Diagnosis not present

## 2017-01-14 DIAGNOSIS — I129 Hypertensive chronic kidney disease with stage 1 through stage 4 chronic kidney disease, or unspecified chronic kidney disease: Secondary | ICD-10-CM | POA: Diagnosis not present

## 2017-01-14 DIAGNOSIS — I10 Essential (primary) hypertension: Secondary | ICD-10-CM

## 2017-01-14 NOTE — Patient Instructions (Signed)
Medication Instructions:  Your physician recommends that you continue on your current medications as directed. Please refer to the Current Medication list given to you today.  If you need a refill on your cardiac medications before your next appointment, please call your pharmacy.   Labwork: None ordered  Testing/Procedures: None ordered  Follow-Up: Your physician wants you to follow-up in: 1 year with Dr. Camnitz.  You will receive a reminder letter in the mail two months in advance. If you don't receive a letter, please call our office to schedule the follow-up appointment.  Thank you for choosing [facility]!!   Mirren Gest, RN [phone]  Any Other Special Instructions Will Be Listed Below (If Applicable).        

## 2017-01-14 NOTE — Progress Notes (Signed)
Electrophysiology Office Note   Date:  01/14/2017   ID:  Becky [name] Jul 18, 1950, MRN [id number]  PCP:  Hulan Fess, MD  Primary Electrophysiologist:  Carter Kassel Meredith Leeds, MD    Chief Complaint  Patient presents with  . Follow-up    Prolonged Q-T      History of Present Illness: Becky [name] is a 66 y.o. female who presents today for electrophysiology evaluation.   She presents for evaluation of falls and long QT interval.   Today, denies symptoms of palpitations, chest pain, shortness of breath, orthopnea, PND, lower extremity edema, claudication, dizziness, presyncope, syncope, bleeding, or neurologic sequela. The patient is tolerating medications without difficulties.  Had no more falls since last being seen.  She has been doing well.  She does not complain of palpitations, shortness of breath, or chest pain.   Past Medical History:  Diagnosis Date  . Chronic kidney disease   . Depression    followed by Dr Toy Care  . Genital herpes   . History of melanoma    followed by derm Dr Delman Cheadle  . Hyperlipidemia   . Hypertension   . Stroke [facility])    Old stroke seen on MRI 12/2009, carotid doppler w minimal plaque, MRI off the head otherwise neg for acute disease.  . Thyroid disease    hypothyroid. Followed by Dr Toy Care  . TIA (transient ischemic attack) 8/08   followed by neurologist Dr Leonie Man   Past Surgical History:  Procedure Laterality Date  . ABDOMINAL HYSTERECTOMY    . APPENDECTOMY    . CHOLECYSTECTOMY    . MELANOMA EXCISION     x2     Current Outpatient Medications  Medication Sig Dispense Refill  . acyclovir (ZOVIRAX) 200 MG capsule Take 200 mg by mouth 5 (five) times daily.    Marland Kitchen ALPRAZolam (XANAX) 0.25 MG tablet 3 (three) times daily as needed.   1  . amLODipine (NORVASC) 5 MG tablet     . aspirin EC 325 MG tablet Take 325 mg by mouth 2 (two) times daily.     . Cholecalciferol (VITAMIN D) 2000 UNITS tablet Take 5,000 Units by mouth daily.       Marland Kitchen Dexmethylphenidate HCl 40 MG CP24 Take 40 mg daily by mouth.    Marland Kitchen FLUoxetine (PROZAC) 40 MG capsule Take 80 mg daily by mouth.    Marland Kitchen L-Methylfolate-Algae (DEPLIN 15 PO) Take 1 tablet daily by mouth.    . levothyroxine (SYNTHROID, LEVOTHROID) 25 MCG tablet Take 25 mcg by mouth daily before breakfast.    . losartan-hydrochlorothiazide (HYZAAR) 100-25 MG per tablet Take 1 tablet by mouth daily.    . nadolol (CORGARD) 20 MG tablet Take 1 tablet (20 mg total) by mouth daily. 90 tablet 0  . OLANZapine (ZYPREXA) 10 MG tablet     . potassium chloride SA (K-DUR,KLOR-CON) 20 MEQ tablet Take 10 mEq by mouth daily.    . pramipexole (MIRAPEX) 0.125 MG tablet   11  . ranitidine (ZANTAC) 150 MG tablet Take 150 mg by mouth 2 (two) times daily.    . rosuvastatin (CRESTOR) 10 MG tablet     . traZODone (DESYREL) 50 MG tablet Take 50 mg by mouth at bedtime as needed for sleep.     No current facility-administered medications for this visit.     Allergies:   Penicillins; Iodinated diagnostic agents; Iohexol; and Nsaids   Social History:  The patient  reports that  has never smoked. she has never used  smokeless tobacco. She reports that she does not drink alcohol or use drugs.   Family History:  The patient's family history includes Breast cancer in her mother; Dementia in her maternal aunt; Hyperlipidemia in her father and sister; Hypertension in her father and sister.    ROS:  Please see the history of present illness.   Otherwise, review of systems is positive for anxiety, depression.   All other systems are reviewed and negative.   PHYSICAL EXAM: VS:  BP (!) 152/90   Pulse 63   Ht 5' (1.524 m)   Wt 175 lb 12.8 oz (79.7 kg)   BMI 34.33 kg/m  , BMI Body mass index is 34.33 kg/m. GEN: Well nourished, well developed, in no acute distress  HEENT: normal  Neck: no JVD, carotid bruits, or masses Cardiac: RRR; no murmurs, rubs, or gallops,no edema  Respiratory:  clear to auscultation bilaterally,  normal work of breathing GI: soft, nontender, nondistended, + BS MS: no deformity or atrophy  Skin: warm and dry Neuro:  Strength and sensation are intact Psych: euthymic mood, full affect  EKG:  EKG is ordered today. Personal review of the ekg ordered  shows sinus rhythm, nonspecific T wave changes   Recent Labs: No results found for requested labs within last 8760 hours.    Lipid Panel     Component Value Date/Time   CHOL  12/17/2009 2355    141        ATP III CLASSIFICATION:  <200     mg/dL   Desirable  200-239  mg/dL   Borderline High  >=240    mg/dL   High          TRIG 135 12/17/2009 2355   HDL 40 12/17/2009 2355   CHOLHDL 3.5 12/17/2009 2355   VLDL 27 12/17/2009 2355   LDLCALC  12/17/2009 2355    74        Total Cholesterol/HDL:CHD Risk Coronary Heart Disease Risk Table                     Men   Women  1/2 Average Risk   3.4   3.3  Average Risk       5.0   4.4  2 X Average Risk   9.6   7.1  3 X Average Risk  23.4   11.0        Use the calculated Patient Ratio above and the CHD Risk Table to determine the patient's CHD Risk.        ATP III CLASSIFICATION (LDL):  <100     mg/dL   Optimal  100-129  mg/dL   Near or Above                    Optimal  130-159  mg/dL   Borderline  160-189  mg/dL   High  >190     mg/dL   Very High     Wt Readings from Last 3 Encounters:  01/14/17 175 lb 12.8 oz (79.7 kg)  01/07/17 176 lb 3.2 oz (79.9 kg)  01/10/16 169 lb 9.6 oz (76.9 kg)      Other studies Reviewed: Additional studies/ records that were reviewed today include: Epic notes   ASSESSMENT AND PLAN:  1.  Long QTc: Has been taken off of a good number of her antidepressants, as well as placed on nadolol.  She has not had any palpitations or signs of polymorphic ventricular tachycardia.  Her QTc has  shortened down to 448 ms on EKG today.  We Stormey Wilborn continue with current management and make no further changes.  2.  Hypertension: Blood pressure is mildly elevated  today, but has been normal at home and in the past.  No changes at this time.  3.  Hyperlipidemia: Continue Crestor.   Current medicines are reviewed at length with the patient today.   The patient does not have concerns regarding her medicines.  The following changes were made today:    Labs/ tests ordered today include:  Orders Placed This Encounter  Procedures  . EKG 12-Lead     Disposition:   FU with Ersel Wadleigh 3 months  Signed, [name] [name] Meredith Leeds, MD  01/14/2017 11:58 AM     [facility] [address] [place] [address] [phone] (office) [phone] (fax)

## 2017-01-16 ENCOUNTER — Other Ambulatory Visit: Payer: Self-pay | Admitting: Nephrology

## 2017-01-16 DIAGNOSIS — N183 Chronic kidney disease, stage 3 unspecified: Secondary | ICD-10-CM

## 2017-01-29 DIAGNOSIS — H2511 Age-related nuclear cataract, right eye: Secondary | ICD-10-CM | POA: Diagnosis not present

## 2017-01-29 DIAGNOSIS — H04123 Dry eye syndrome of bilateral lacrimal glands: Secondary | ICD-10-CM | POA: Diagnosis not present

## 2017-02-07 ENCOUNTER — Other Ambulatory Visit: Payer: Self-pay | Admitting: Cardiology

## 2017-02-11 ENCOUNTER — Ambulatory Visit
Admission: RE | Admit: 2017-02-11 | Discharge: 2017-02-11 | Disposition: A | Discharge status: Home / Self Care | Payer: Medicare Other | Source: Ambulatory Visit | Attending: Nephrology | Admitting: Nephrology

## 2017-02-11 DIAGNOSIS — N183 Chronic kidney disease, stage 3 unspecified: Secondary | ICD-10-CM

## 2017-03-06 DIAGNOSIS — R110 Nausea: Secondary | ICD-10-CM | POA: Diagnosis not present

## 2017-03-06 DIAGNOSIS — I10 Essential (primary) hypertension: Secondary | ICD-10-CM | POA: Diagnosis not present

## 2017-03-06 DIAGNOSIS — Z6833 Body mass index (BMI) 33.0-33.9, adult: Secondary | ICD-10-CM | POA: Diagnosis not present

## 2017-03-06 DIAGNOSIS — E669 Obesity, unspecified: Secondary | ICD-10-CM | POA: Diagnosis not present

## 2017-03-19 ENCOUNTER — Other Ambulatory Visit: Payer: Self-pay | Admitting: *Deleted

## 2017-03-19 NOTE — Telephone Encounter (Signed)
New message ° °Pt verbalized that she is returning call for RN °

## 2017-03-19 NOTE — Telephone Encounter (Signed)
Patient calling to receive hard copy of signed RX for Nadolol to personally sent to her mail order in [place].  She would like this mailed to her home address as soon as possible. Will route this message to RN and MD

## 2017-03-19 NOTE — Telephone Encounter (Signed)
Pt would like handwritten Rx sent to mail order in [place]. Mailed to: [facility] [place] [address]

## 2017-03-20 MED ORDER — NADOLOL 20 MG PO TABS: 20.0000 mg | ORAL_TABLET | Freq: Every day | ORAL | 1 refills | Status: DC

## 2017-06-24 DIAGNOSIS — R05 Cough: Secondary | ICD-10-CM | POA: Diagnosis not present

## 2017-06-24 DIAGNOSIS — E7800 Pure hypercholesterolemia, unspecified: Secondary | ICD-10-CM | POA: Diagnosis not present

## 2017-07-02 DIAGNOSIS — E785 Hyperlipidemia, unspecified: Secondary | ICD-10-CM | POA: Diagnosis not present

## 2017-07-02 DIAGNOSIS — I10 Essential (primary) hypertension: Secondary | ICD-10-CM | POA: Diagnosis not present

## 2017-07-02 DIAGNOSIS — R69 Illness, unspecified: Secondary | ICD-10-CM | POA: Diagnosis not present

## 2017-07-02 DIAGNOSIS — N3281 Overactive bladder: Secondary | ICD-10-CM | POA: Diagnosis not present

## 2017-07-02 DIAGNOSIS — K219 Gastro-esophageal reflux disease without esophagitis: Secondary | ICD-10-CM | POA: Diagnosis not present

## 2017-07-02 DIAGNOSIS — E039 Hypothyroidism, unspecified: Secondary | ICD-10-CM | POA: Diagnosis not present

## 2017-07-02 DIAGNOSIS — B009 Herpesviral infection, unspecified: Secondary | ICD-10-CM | POA: Diagnosis not present

## 2017-07-02 DIAGNOSIS — G2581 Restless legs syndrome: Secondary | ICD-10-CM | POA: Diagnosis not present

## 2017-07-02 DIAGNOSIS — E669 Obesity, unspecified: Secondary | ICD-10-CM | POA: Diagnosis not present

## 2017-07-07 DIAGNOSIS — Z85820 Personal history of malignant melanoma of skin: Secondary | ICD-10-CM | POA: Diagnosis not present

## 2017-07-07 DIAGNOSIS — L821 Other seborrheic keratosis: Secondary | ICD-10-CM | POA: Diagnosis not present

## 2017-07-07 DIAGNOSIS — Z86018 Personal history of other benign neoplasm: Secondary | ICD-10-CM | POA: Diagnosis not present

## 2017-07-07 DIAGNOSIS — L814 Other melanin hyperpigmentation: Secondary | ICD-10-CM | POA: Diagnosis not present

## 2017-07-07 DIAGNOSIS — D485 Neoplasm of uncertain behavior of skin: Secondary | ICD-10-CM | POA: Diagnosis not present

## 2017-07-07 DIAGNOSIS — D225 Melanocytic nevi of trunk: Secondary | ICD-10-CM | POA: Diagnosis not present

## 2017-07-07 DIAGNOSIS — D2272 Melanocytic nevi of left lower limb, including hip: Secondary | ICD-10-CM | POA: Diagnosis not present

## 2017-11-10 DIAGNOSIS — H903 Sensorineural hearing loss, bilateral: Secondary | ICD-10-CM | POA: Diagnosis not present

## 2017-12-09 DIAGNOSIS — Z86010 Personal history of colonic polyps: Secondary | ICD-10-CM | POA: Diagnosis not present

## 2017-12-09 DIAGNOSIS — N189 Chronic kidney disease, unspecified: Secondary | ICD-10-CM | POA: Diagnosis not present

## 2017-12-09 DIAGNOSIS — M8580 Other specified disorders of bone density and structure, unspecified site: Secondary | ICD-10-CM | POA: Diagnosis not present

## 2017-12-09 DIAGNOSIS — Z8673 Personal history of transient ischemic attack (TIA), and cerebral infarction without residual deficits: Secondary | ICD-10-CM | POA: Diagnosis not present

## 2017-12-09 DIAGNOSIS — Z0000 Encounter for general adult medical examination without abnormal findings: Secondary | ICD-10-CM | POA: Diagnosis not present

## 2017-12-09 DIAGNOSIS — Z87442 Personal history of urinary calculi: Secondary | ICD-10-CM | POA: Diagnosis not present

## 2017-12-09 DIAGNOSIS — I10 Essential (primary) hypertension: Secondary | ICD-10-CM | POA: Diagnosis not present

## 2017-12-09 DIAGNOSIS — Z23 Encounter for immunization: Secondary | ICD-10-CM | POA: Diagnosis not present

## 2017-12-09 DIAGNOSIS — R413 Other amnesia: Secondary | ICD-10-CM | POA: Diagnosis not present

## 2017-12-09 DIAGNOSIS — R7301 Impaired fasting glucose: Secondary | ICD-10-CM | POA: Diagnosis not present

## 2017-12-14 ENCOUNTER — Other Ambulatory Visit: Payer: Self-pay | Admitting: Family Medicine

## 2017-12-14 DIAGNOSIS — Z8673 Personal history of transient ischemic attack (TIA), and cerebral infarction without residual deficits: Secondary | ICD-10-CM

## 2017-12-25 DIAGNOSIS — R7301 Impaired fasting glucose: Secondary | ICD-10-CM | POA: Diagnosis not present

## 2017-12-25 DIAGNOSIS — I10 Essential (primary) hypertension: Secondary | ICD-10-CM | POA: Diagnosis not present

## 2017-12-25 DIAGNOSIS — M8580 Other specified disorders of bone density and structure, unspecified site: Secondary | ICD-10-CM | POA: Diagnosis not present

## 2017-12-25 DIAGNOSIS — R413 Other amnesia: Secondary | ICD-10-CM | POA: Diagnosis not present

## 2017-12-25 DIAGNOSIS — R69 Illness, unspecified: Secondary | ICD-10-CM | POA: Diagnosis not present

## 2017-12-25 DIAGNOSIS — Z85820 Personal history of malignant melanoma of skin: Secondary | ICD-10-CM | POA: Diagnosis not present

## 2017-12-25 DIAGNOSIS — Z1159 Encounter for screening for other viral diseases: Secondary | ICD-10-CM | POA: Diagnosis not present

## 2017-12-25 DIAGNOSIS — N189 Chronic kidney disease, unspecified: Secondary | ICD-10-CM | POA: Diagnosis not present

## 2017-12-25 DIAGNOSIS — Z86010 Personal history of colonic polyps: Secondary | ICD-10-CM | POA: Diagnosis not present

## 2017-12-25 DIAGNOSIS — E039 Hypothyroidism, unspecified: Secondary | ICD-10-CM | POA: Diagnosis not present

## 2018-01-05 ENCOUNTER — Ambulatory Visit: Payer: Medicare Other | Admitting: Cardiology

## 2018-01-07 ENCOUNTER — Ambulatory Visit: Payer: Medicare Other | Admitting: Neurology

## 2018-01-12 ENCOUNTER — Encounter: Payer: Self-pay | Admitting: *Deleted

## 2018-01-18 ENCOUNTER — Ambulatory Visit: Payer: Medicare HMO | Admitting: Cardiology

## 2018-01-18 NOTE — Progress Notes (Deleted)
Electrophysiology Office Note   Date:  01/18/2018   ID:  Becky, [name] 11-27-1950, MRN [id number]  PCP:  Becky Fess, MD  Primary Electrophysiologist:  Becky Haw, MD    No chief complaint on file.    History of Present Illness: Becky [name] is a 67 y.o. female who presents today for electrophysiology evaluation.   She presents for evaluation of falls and long QT interval.   Today, denies symptoms of palpitations, chest pain, shortness of breath, orthopnea, PND, lower extremity edema, claudication, dizziness, presyncope, syncope, bleeding, or neurologic sequela. The patient is tolerating medications without difficulties. ***    Past Medical History:  Diagnosis Date  . Chronic kidney disease   . Depression    followed by Dr Becky [name]  . Genital herpes   . History of melanoma    followed by derm Dr Becky [name]  . Hyperlipidemia   . Hypertension   . Stroke [name])    Old stroke seen on MRI 12/2009, carotid doppler w minimal plaque, MRI off the head otherwise neg for acute disease.  . Thyroid disease    hypothyroid. Followed by Dr Becky [name]  . TIA (transient ischemic attack) 8/08   followed by neurologist Dr Becky [name]   Past Surgical History:  Procedure Laterality Date  . ABDOMINAL HYSTERECTOMY    . APPENDECTOMY    . CHOLECYSTECTOMY    . MELANOMA EXCISION     x2     Current Outpatient Medications  Medication Sig Dispense Refill  . acyclovir (ZOVIRAX) 200 MG capsule Take 200 mg by mouth 5 (five) times daily.    Marland Kitchen ALPRAZolam (XANAX) 0.25 MG tablet 3 (three) times daily as needed.   1  . amLODipine (NORVASC) 5 MG tablet     . aspirin EC 325 MG tablet Take 325 mg by mouth 2 (two) times daily.     . Cholecalciferol (VITAMIN D) 2000 UNITS tablet Take 5,000 Units by mouth daily.     Marland Kitchen Dexmethylphenidate HCl 40 MG CP24 Take 40 mg daily by mouth.    Marland Kitchen FLUoxetine (PROZAC) 40 MG capsule Take 80 mg daily by mouth.    Marland Kitchen L-Methylfolate-Algae (DEPLIN 15 PO) Take 1  tablet daily by mouth.    . levothyroxine (SYNTHROID, LEVOTHROID) 25 MCG tablet Take 25 mcg by mouth daily before breakfast.    . losartan-hydrochlorothiazide (HYZAAR) 100-25 MG per tablet Take 1 tablet by mouth daily.    . nadolol (CORGARD) 20 MG tablet Take 1 tablet (20 mg total) by mouth daily. 90 tablet 1  . OLANZapine (ZYPREXA) 10 MG tablet     . potassium chloride SA (K-DUR,KLOR-CON) 20 MEQ tablet Take 10 mEq by mouth daily.    . pramipexole (MIRAPEX) 0.125 MG tablet   11  . ranitidine (ZANTAC) 150 MG tablet Take 150 mg by mouth 2 (two) times daily.    . rosuvastatin (CRESTOR) 10 MG tablet     . traZODone (DESYREL) 50 MG tablet Take 50 mg by mouth at bedtime as needed for sleep.     No current facility-administered medications for this visit.     Allergies:   Penicillins; Iodinated diagnostic agents; Iohexol; and Nsaids   Social History:  The patient  reports that she has never smoked. She has never used smokeless tobacco. She reports that she does not drink alcohol or use drugs.   Family History:  The patient's family history includes Breast cancer in her mother; Dementia in her maternal aunt; Heart attack in her father;  Hyperlipidemia in her father and sister; Hypertension in her father and sister.    ROS:  Please see the history of present illness.   Otherwise, review of systems is positive for ***.   All other systems are reviewed and negative.   PHYSICAL EXAM: VS:  There were no vitals taken for this visit. , BMI There is no height or weight on file to calculate BMI. GEN: Well nourished, well developed, in no acute distress  HEENT: normal  Neck: no JVD, carotid bruits, or masses Cardiac: ***RRR; no murmurs, rubs, or gallops,no edema  Respiratory:  clear to auscultation bilaterally, normal work of breathing GI: soft, nontender, nondistended, + BS MS: no deformity or atrophy  Skin: warm and dry Neuro:  Strength and sensation are intact Psych: euthymic mood, full  affect  EKG:  EKG {ACTION; IS/IS [id number]} ordered today. Personal review of the ekg ordered *** shows ***   Recent Labs: No results found for requested labs within last 8760 hours.    Lipid Panel     Component Value Date/Time   CHOL  12/17/2009 2355    141        ATP III CLASSIFICATION:  <200     mg/dL   Desirable  200-239  mg/dL   Borderline High  >=240    mg/dL   High          TRIG 135 12/17/2009 2355   HDL 40 12/17/2009 2355   CHOLHDL 3.5 12/17/2009 2355   VLDL 27 12/17/2009 2355   LDLCALC  12/17/2009 2355    74        Total Cholesterol/HDL:CHD Risk Coronary Heart Disease Risk Table                     Men   Women  1/2 Average Risk   3.4   3.3  Average Risk       5.0   4.4  2 X Average Risk   9.6   7.1  3 X Average Risk  23.4   11.0        Use the calculated Patient Ratio above and the CHD Risk Table to determine the patient's CHD Risk.        ATP III CLASSIFICATION (LDL):  <100     mg/dL   Optimal  100-129  mg/dL   Near or Above                    Optimal  130-159  mg/dL   Borderline  160-189  mg/dL   High  >190     mg/dL   Very High     Wt Readings from Last 3 Encounters:  01/14/17 175 lb 12.8 oz (79.7 kg)  01/07/17 176 lb 3.2 oz (79.9 kg)  01/10/16 169 lb 9.6 oz (76.9 kg)      Other studies Reviewed: Additional studies/ records that were reviewed today include: Epic notes   ASSESSMENT AND PLAN:  1.  Long QTc: ***  Has been taken off of a good number of her antidepressants, as well as placed on nadolol.  She has not had any palpitations or signs of polymorphic ventricular tachycardia.  Her QTc has shortened down to 448 ms on EKG today.  We  continue with current management and make no further changes.  2.  Hypertension: ***  3.  Hyperlipidemia: continue crestor   Current medicines are reviewed at length with the patient today.   The patient does not have  concerns regarding her medicines.  The following changes were made today:   ***  Labs/ tests ordered today include:  No orders of the defined types were placed in this encounter.    Disposition:   FU with   *** months  Signed,  Becky Leeds, MD  01/18/2018 1:43 PM     [place] [address] [phone] (office) [phone] (fax)

## 2018-01-19 DIAGNOSIS — Z1231 Encounter for screening mammogram for malignant neoplasm of breast: Secondary | ICD-10-CM | POA: Diagnosis not present

## 2018-01-19 DIAGNOSIS — Z803 Family history of malignant neoplasm of breast: Secondary | ICD-10-CM | POA: Diagnosis not present

## 2018-01-21 ENCOUNTER — Ambulatory Visit
Admission: RE | Admit: 2018-01-21 | Discharge: 2018-01-21 | Disposition: A | Discharge status: Home / Self Care | Payer: Medicare HMO | Source: Ambulatory Visit | Attending: Family Medicine | Admitting: Family Medicine

## 2018-01-21 DIAGNOSIS — I6521 Occlusion and stenosis of right carotid artery: Secondary | ICD-10-CM | POA: Diagnosis not present

## 2018-01-21 DIAGNOSIS — Z8673 Personal history of transient ischemic attack (TIA), and cerebral infarction without residual deficits: Secondary | ICD-10-CM

## 2018-02-04 ENCOUNTER — Ambulatory Visit: Payer: Medicare HMO | Admitting: Neurology

## 2018-02-09 DIAGNOSIS — G3184 Mild cognitive impairment, so stated: Secondary | ICD-10-CM | POA: Diagnosis not present

## 2018-02-09 DIAGNOSIS — E785 Hyperlipidemia, unspecified: Secondary | ICD-10-CM | POA: Diagnosis not present

## 2018-02-09 DIAGNOSIS — I129 Hypertensive chronic kidney disease with stage 1 through stage 4 chronic kidney disease, or unspecified chronic kidney disease: Secondary | ICD-10-CM | POA: Diagnosis not present

## 2018-02-09 DIAGNOSIS — N183 Chronic kidney disease, stage 3 (moderate): Secondary | ICD-10-CM | POA: Diagnosis not present

## 2018-03-11 ENCOUNTER — Ambulatory Visit: Payer: Medicare HMO | Admitting: Cardiology

## 2018-03-29 ENCOUNTER — Ambulatory Visit: Payer: Medicare HMO | Admitting: Cardiology

## 2018-03-29 ENCOUNTER — Ambulatory Visit: Payer: Medicare HMO | Admitting: Neurology

## 2018-04-07 ENCOUNTER — Ambulatory Visit: Payer: Medicare HMO | Admitting: Cardiology

## 2018-04-07 ENCOUNTER — Encounter

## 2018-04-15 DIAGNOSIS — Z1211 Encounter for screening for malignant neoplasm of colon: Secondary | ICD-10-CM | POA: Diagnosis not present

## 2018-04-15 DIAGNOSIS — K5730 Diverticulosis of large intestine without perforation or abscess without bleeding: Secondary | ICD-10-CM | POA: Diagnosis not present

## 2018-04-15 DIAGNOSIS — Z86010 Personal history of colonic polyps: Secondary | ICD-10-CM | POA: Diagnosis not present

## 2018-04-21 DIAGNOSIS — R69 Illness, unspecified: Secondary | ICD-10-CM | POA: Diagnosis not present

## 2018-04-27 DIAGNOSIS — R69 Illness, unspecified: Secondary | ICD-10-CM | POA: Diagnosis not present

## 2018-04-28 ENCOUNTER — Ambulatory Visit: Payer: Medicare HMO | Admitting: Neurology

## 2018-04-28 ENCOUNTER — Encounter: Payer: Self-pay | Admitting: Neurology

## 2018-04-28 VITALS — BP 150/81 | HR 67 | Ht 60.0 in | Wt 179.0 lb

## 2018-04-28 DIAGNOSIS — G3184 Mild cognitive impairment, so stated: Secondary | ICD-10-CM

## 2018-04-28 NOTE — Patient Instructions (Signed)
I had a long discussion with the patient regarding her mild cognitive impairment which appears to be stable.  Continue participation in cognitively challenging activities like solving crossword puzzles, playing bridge and sudoku.  Continue using fish oil and focus factor.  Continue aspirin for stroke prevention and strict control of hypertension with blood pressure goal below 130/90, lipids with LDL cholesterol goal below 70 mg percent.  She will return for follow-up in the future in a year or call earlier if necessary.

## 2018-04-28 NOTE — Progress Notes (Signed)
[facility] [address] [place]. [place] [address] [phone]       OFFICE FOLLOW UP VISIT NOTE  Ms. [name] Date of Birth:  1950/12/01 Medical Record Number:  [id number]   Referring MD:  Hulan Fess  Reason for Referral:  Cognitive impairment and balance problems  HPI: Initial Consult 10/01/15 :  [name] is a 78 year lady with long-standing history of anxiety depression who for the last 1 year or so has had progressive memory and cognitive difficulties as well as increased frequency of falls and balance problems. She is accompanied today by a cousin [name]. Patient admits she did have some mild cognitive memory problems even prior to a year but since she got ECT in June 2016 this seems to have been more pronounced. She got only 4 treatments every other day. 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 She gives several examples like going to plan to change direct drops on her checking account but once she reached that she was unable to tell the bank arrest why she had come. On occasion while driving home from a friend's house she could not remember the way home and got lost. 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 She denies any headaches, extremity weakness. She is also had increased frequency of falls and states that she falls without any warning. She does not feel like she didn't pass out or have any syncopal symptoms. She falls all of a sudden. She is able to stop herself sometimes but has bruised herself. She has never lost consciousness. She denies any tingling numbness pain or burning in her feet. She denies walking like a drunk. She has no history of significant head injury with loss of consciousness. She has no family history of dementia. She lives alone she is now getting some  help with finances but needs to keep a calendar calendar to write things down so she does not forget. She has a remote history of tiny (lacunar infarct in  September 2008. MRA of the brain showed only mild atherosclerotic changes She presented at that time the 30 minute episode of right-sided facial numbness and slurred speech. I had seen her in the hospital at that time but she was lost to follow-up. She states she's had no further recurrent stroke or TIA symptoms. Update 01/10/2016 : She returns for follow-up after last visit 3 months ago. She is accompanied by her friend [name]. Patient states her short-term memory and cognitive difficulties appeared unchanged. She needs constant reminders and needs to write things down. She still independent demented her own affairs. Patient feels that she is feeling a little better after she has cut down some of her medications for anxiety and sleep. She is more alert and more interactive with her surroundings. She had MRI scan of the brain done on 10/2115 which have personally reviewed showed old left frontal infarct with encephalomalacia as well as mild changes of small vessel disease. No acute abnormality was noted. MRI scan cervical spine showed mild degenerative changes mainly at C6-7 but no definite compression is severe spinal stenosis. Lab work for reversible causes of memory loss and 10/01/15 were all normal including B12, TSH, RPR and homocysteine. EEG done on 8/9 for 17 was also normal. Patient has been taking fish oil  but has not been participating in cognitively challenging activities. Patient does feel that she may have Alzheimer's.  Update 01/07/2017 : She returns for follow-up after last visit a year ago.. She states she is doing well she has had no new neurological issues.  She continues to have mild short-term memory difficulties which are unchanged.  She has learned to use a calendar and does keep herself busy with doing card games.  She is still fully  functional and independent managing her own affairs.  She states her blood pressure is quite good today it is 1 2 5/70.  She remains on aspirin which is tolerating well without significant bruising or bleeding.  She is also on Crestor which is tolerating well without muscle aches and pains.  If she denies any new neurological complaints.  She has had no new major medical problems since her last visit. Update 04/28/2018 : She returns for follow-up after last visit in November 2018.  She states she is doing well.  Her mild short-term memory and cognitive difficulties appear unchanged.  She is started taking focus factor as well as fish oil.  She keeps herself busy with doing mentally challenging activities and does play computer brain games.  She is not had recurrent stroke or TIA symptoms since 2008.  She remains on aspirin which is tolerating well without bleeding or bruising.  She states her blood pressure is under good control.  She had lipid profile and lab work done by primary care physician few months ago which was satisfactory.  She had follow-up carotid ultrasound done on 01/21/2018 which showed no significant extracranial stenosis.  She has no new complaints today. ROS:   14 system review of systems is positive for   memory loss, restless leg, anxiety, depression and all other systems negative  PMH:  Past Medical History:  Diagnosis Date  . Chronic kidney disease   . Depression    followed by Dr Toy Care  . Genital herpes   . History of melanoma    followed by derm Dr Delman Cheadle  . Hyperlipidemia   . Hypertension   . Stroke [facility])    Old stroke seen on MRI 12/2009, carotid doppler w minimal plaque, MRI off the head otherwise neg for acute disease.  . Thyroid disease    hypothyroid. Followed by Dr Toy Care  . TIA (transient ischemic attack) 8/08   followed by neurologist Dr Leonie Man    Social History:  Social History   Socioeconomic History  . Marital status: Single    Spouse name: Not on file  .  Number of children: Not on file  . Years of education: Not on file  . Highest education level: Not on file  Occupational History  . Not on file  Social Needs  . Financial resource strain: Not on file  . Food insecurity:    Worry: Not on file    Inability: Not on file  . Transportation needs:    Medical: Not on file    Non-medical: Not on file  Tobacco Use  . Smoking status: Never Smoker  . Smokeless tobacco: Never Used  Substance and Sexual Activity  . Alcohol use: No  . Drug use: No  . Sexual activity: Never  Lifestyle  . Physical activity:    Days per week: Not on file    Minutes per session: Not on file  . Stress: Not on file  Relationships  . Social connections:    Talks on phone: Not on file  Gets together: Not on file    Attends religious service: Not on file    Active member of club or organization: Not on file    Attends meetings of clubs or organizations: Not on file    Relationship status: Not on file  . Intimate partner violence:    Fear of current or ex partner: Not on file    Emotionally abused: Not on file    Physically abused: Not on file    Forced sexual activity: Not on file  Other Topics Concern  . Not on file  Social History Narrative  . Not on file    Medications:   Current Outpatient Medications on File Prior to Visit  Medication Sig Dispense Refill  . acyclovir (ZOVIRAX) 200 MG capsule Take 200 mg by mouth 5 (five) times daily.    Marland Kitchen ALPRAZolam (XANAX) 0.25 MG tablet 3 (three) times daily as needed.   1  . amLODipine (NORVASC) 5 MG tablet     . aspirin EC 325 MG tablet Take 325 mg by mouth 2 (two) times daily.     . Cholecalciferol (VITAMIN D) 2000 UNITS tablet Take 5,000 Units by mouth daily.     Marland Kitchen Dexmethylphenidate HCl 40 MG CP24 Take 40 mg daily by mouth.    Marland Kitchen FLUoxetine (PROZAC) 40 MG capsule Take 80 mg daily by mouth.    Marland Kitchen L-Methylfolate-Algae (DEPLIN 15 PO) Take 1 tablet daily by mouth.    . levothyroxine (SYNTHROID, LEVOTHROID) 25  MCG tablet Take 25 mcg by mouth daily before breakfast.    . losartan-hydrochlorothiazide (HYZAAR) 100-25 MG per tablet Take 1 tablet by mouth daily.    . nadolol (CORGARD) 20 MG tablet Take 1 tablet (20 mg total) by mouth daily. 90 tablet 1  . OLANZapine (ZYPREXA) 10 MG tablet     . potassium chloride SA (K-DUR,KLOR-CON) 20 MEQ tablet Take 10 mEq by mouth daily.    . pramipexole (MIRAPEX) 0.125 MG tablet   11  . ranitidine (ZANTAC) 150 MG tablet Take 150 mg by mouth 2 (two) times daily.    . rosuvastatin (CRESTOR) 10 MG tablet     . traZODone (DESYREL) 50 MG tablet Take 50 mg by mouth at bedtime as needed for sleep.     No current facility-administered medications on file prior to visit.     Allergies:   Allergies  Allergen Reactions  . Penicillins Anaphylaxis  . Iodinated Diagnostic Agents Other (See Comments)  . Iohexol      Desc: iodine   . Nsaids     CKD     Physical Exam General: well developed, well nourished, Middle-aged Caucasian lady seated, in no evident distress Head: head normocephalic and atraumatic.   Neck: supple with no carotid or supraclavicular bruits Cardiovascular: regular rate and rhythm, no murmurs Musculoskeletal: no deformity Skin:  no rash/petichiae Vascular:  Normal pulses all extremities  Neurologic Exam Mental Status: Awake and fully alert. Oriented to place and time. Recent and remote memory intact. Attention span, concentration and fund of knowledge appropriate. Mood and affect appropriate. Mini-Mental status not done )  Animal naming test 13. Clock drawing 4/4.  Recall 2/3. Cranial Nerves: Fundoscopic exam not done. Pupils equal, briskly reactive to light. Extraocular movements full without nystagmus. Visual fields full to confrontation. Hearing intact. Facial sensation intact. Face, tongue, palate moves normally and symmetrically.  Motor: Normal bulk and tone. Normal strength in all tested extremity muscles. Sensory.: intact to touch , pinprick  , position and vibratory  sensation.  Coordination: Rapid alternating movements normal in all extremities. Finger-to-nose and heel-to-shin performed accurately bilaterally. Gait and Station: Arises from chair without difficulty. Stance is normal. Gait demonstrates normal stride length and balance . Able to heel, toe and tandem walk with slight difficulty.  Reflexes: 3+ brisk and symmetric. Toes downgoing.       ASSESSMENT: 58 year lady with long-standing history of anxiety depression with recent cognitive impairment and memory difficulties likely due to mild cognitive impairment.  She appears stable.  Remote history of left brain lacunar infarct in 2008 with vascular risk factors of hypertension hyperlipidemia.    PLAN: I had a long discussion with the patient regarding her mild cognitive impairment which appears to be stable.  Continue participation in cognitively challenging activities like solving crossword puzzles, playing bridge and sudoku.  Continue using fish oil and focus factor.  Continue aspirin for stroke prevention and strict control of hypertension with blood pressure goal below 130/90, lipids with LDL cholesterol goal below 70 mg percent.  He does not 50% time during this 25-minute visit was spent on counseling and coordination of care about her mild cognitive impairment and remote stroke and answering questions.  She will return for follow-up in the future in a year or call earlier if necessary.               Antony Contras, MD  [facility] [address] [place], [place] [address]  Phone [phone] Fax [phone]  Note: This document was prepared with digital dictation and possible smart phrase technology. Any transcriptional errors that result from this process are unintentional.

## 2018-04-29 DIAGNOSIS — R69 Illness, unspecified: Secondary | ICD-10-CM | POA: Diagnosis not present

## 2018-04-30 DIAGNOSIS — R69 Illness, unspecified: Secondary | ICD-10-CM | POA: Diagnosis not present

## 2018-05-03 DIAGNOSIS — R69 Illness, unspecified: Secondary | ICD-10-CM | POA: Diagnosis not present

## 2018-05-04 DIAGNOSIS — I10 Essential (primary) hypertension: Secondary | ICD-10-CM | POA: Diagnosis not present

## 2018-05-04 DIAGNOSIS — G2581 Restless legs syndrome: Secondary | ICD-10-CM | POA: Diagnosis not present

## 2018-05-04 DIAGNOSIS — R69 Illness, unspecified: Secondary | ICD-10-CM | POA: Diagnosis not present

## 2018-05-04 DIAGNOSIS — E669 Obesity, unspecified: Secondary | ICD-10-CM | POA: Diagnosis not present

## 2018-05-04 DIAGNOSIS — K219 Gastro-esophageal reflux disease without esophagitis: Secondary | ICD-10-CM | POA: Diagnosis not present

## 2018-05-04 DIAGNOSIS — Z6834 Body mass index (BMI) 34.0-34.9, adult: Secondary | ICD-10-CM | POA: Diagnosis not present

## 2018-05-04 DIAGNOSIS — R32 Unspecified urinary incontinence: Secondary | ICD-10-CM | POA: Diagnosis not present

## 2018-05-04 DIAGNOSIS — E785 Hyperlipidemia, unspecified: Secondary | ICD-10-CM | POA: Diagnosis not present

## 2018-05-04 DIAGNOSIS — E039 Hypothyroidism, unspecified: Secondary | ICD-10-CM | POA: Diagnosis not present

## 2018-05-04 DIAGNOSIS — F419 Anxiety disorder, unspecified: Secondary | ICD-10-CM | POA: Diagnosis not present

## 2018-05-05 DIAGNOSIS — R69 Illness, unspecified: Secondary | ICD-10-CM | POA: Diagnosis not present

## 2018-05-06 DIAGNOSIS — R69 Illness, unspecified: Secondary | ICD-10-CM | POA: Diagnosis not present

## 2018-05-07 DIAGNOSIS — R69 Illness, unspecified: Secondary | ICD-10-CM | POA: Diagnosis not present

## 2018-05-10 DIAGNOSIS — R69 Illness, unspecified: Secondary | ICD-10-CM | POA: Diagnosis not present

## 2018-05-12 DIAGNOSIS — R69 Illness, unspecified: Secondary | ICD-10-CM | POA: Diagnosis not present

## 2018-05-13 DIAGNOSIS — R69 Illness, unspecified: Secondary | ICD-10-CM | POA: Diagnosis not present

## 2018-05-14 DIAGNOSIS — R69 Illness, unspecified: Secondary | ICD-10-CM | POA: Diagnosis not present

## 2018-05-17 DIAGNOSIS — R69 Illness, unspecified: Secondary | ICD-10-CM | POA: Diagnosis not present

## 2018-05-18 DIAGNOSIS — R69 Illness, unspecified: Secondary | ICD-10-CM | POA: Diagnosis not present

## 2018-05-19 DIAGNOSIS — R69 Illness, unspecified: Secondary | ICD-10-CM | POA: Diagnosis not present

## 2018-05-20 DIAGNOSIS — R69 Illness, unspecified: Secondary | ICD-10-CM | POA: Diagnosis not present

## 2018-05-21 DIAGNOSIS — R69 Illness, unspecified: Secondary | ICD-10-CM | POA: Diagnosis not present

## 2018-05-24 DIAGNOSIS — R69 Illness, unspecified: Secondary | ICD-10-CM | POA: Diagnosis not present

## 2018-05-25 DIAGNOSIS — R69 Illness, unspecified: Secondary | ICD-10-CM | POA: Diagnosis not present

## 2018-05-26 DIAGNOSIS — R69 Illness, unspecified: Secondary | ICD-10-CM | POA: Diagnosis not present

## 2018-05-27 ENCOUNTER — Telehealth: Payer: Self-pay | Admitting: *Deleted

## 2018-05-27 DIAGNOSIS — R69 Illness, unspecified: Secondary | ICD-10-CM | POA: Diagnosis not present

## 2018-05-27 MED ORDER — NADOLOL 20 MG PO TABS: 20.0000 mg | ORAL_TABLET | Freq: Every day | ORAL | 1 refills | Status: DC

## 2018-05-27 NOTE — Telephone Encounter (Signed)
Cancelled f/u appt d/t COVID-19 precautions.  Pt aware office will call her to reschedule at a later date. Pt reports doing fine, no problems to report. Nadolol refill sent in per request. Patient verbalized understanding and agreeable to plan.

## 2018-05-28 ENCOUNTER — Ambulatory Visit: Payer: Medicare HMO | Admitting: Cardiology

## 2018-05-28 DIAGNOSIS — R69 Illness, unspecified: Secondary | ICD-10-CM | POA: Diagnosis not present

## 2018-05-31 DIAGNOSIS — R69 Illness, unspecified: Secondary | ICD-10-CM | POA: Diagnosis not present

## 2018-06-01 DIAGNOSIS — R69 Illness, unspecified: Secondary | ICD-10-CM | POA: Diagnosis not present

## 2018-06-03 DIAGNOSIS — R69 Illness, unspecified: Secondary | ICD-10-CM | POA: Diagnosis not present

## 2018-06-04 DIAGNOSIS — R69 Illness, unspecified: Secondary | ICD-10-CM | POA: Diagnosis not present

## 2018-06-07 DIAGNOSIS — R69 Illness, unspecified: Secondary | ICD-10-CM | POA: Diagnosis not present

## 2018-06-08 DIAGNOSIS — R69 Illness, unspecified: Secondary | ICD-10-CM | POA: Diagnosis not present

## 2018-06-09 DIAGNOSIS — R69 Illness, unspecified: Secondary | ICD-10-CM | POA: Diagnosis not present

## 2018-06-14 DIAGNOSIS — R69 Illness, unspecified: Secondary | ICD-10-CM | POA: Diagnosis not present

## 2018-06-15 DIAGNOSIS — R69 Illness, unspecified: Secondary | ICD-10-CM | POA: Diagnosis not present

## 2018-06-16 DIAGNOSIS — R69 Illness, unspecified: Secondary | ICD-10-CM | POA: Diagnosis not present

## 2018-06-21 DIAGNOSIS — R69 Illness, unspecified: Secondary | ICD-10-CM | POA: Diagnosis not present

## 2018-06-23 DIAGNOSIS — R69 Illness, unspecified: Secondary | ICD-10-CM | POA: Diagnosis not present

## 2018-06-25 DIAGNOSIS — R69 Illness, unspecified: Secondary | ICD-10-CM | POA: Diagnosis not present

## 2018-06-29 DIAGNOSIS — R69 Illness, unspecified: Secondary | ICD-10-CM | POA: Diagnosis not present

## 2018-07-29 DIAGNOSIS — D2261 Melanocytic nevi of right upper limb, including shoulder: Secondary | ICD-10-CM | POA: Diagnosis not present

## 2018-07-29 DIAGNOSIS — C4372 Malignant melanoma of left lower limb, including hip: Secondary | ICD-10-CM | POA: Diagnosis not present

## 2018-07-29 DIAGNOSIS — D485 Neoplasm of uncertain behavior of skin: Secondary | ICD-10-CM | POA: Diagnosis not present

## 2018-07-29 DIAGNOSIS — Z85820 Personal history of malignant melanoma of skin: Secondary | ICD-10-CM | POA: Diagnosis not present

## 2018-07-29 DIAGNOSIS — L821 Other seborrheic keratosis: Secondary | ICD-10-CM | POA: Diagnosis not present

## 2018-07-29 DIAGNOSIS — D225 Melanocytic nevi of trunk: Secondary | ICD-10-CM | POA: Diagnosis not present

## 2018-07-29 DIAGNOSIS — Z86018 Personal history of other benign neoplasm: Secondary | ICD-10-CM | POA: Diagnosis not present

## 2018-08-04 DIAGNOSIS — R69 Illness, unspecified: Secondary | ICD-10-CM | POA: Diagnosis not present

## 2018-08-06 DIAGNOSIS — R0609 Other forms of dyspnea: Secondary | ICD-10-CM | POA: Diagnosis not present

## 2018-08-09 DIAGNOSIS — R0600 Dyspnea, unspecified: Secondary | ICD-10-CM | POA: Diagnosis not present

## 2018-08-13 DIAGNOSIS — I10 Essential (primary) hypertension: Secondary | ICD-10-CM | POA: Diagnosis not present

## 2018-08-13 DIAGNOSIS — Z6835 Body mass index (BMI) 35.0-35.9, adult: Secondary | ICD-10-CM | POA: Diagnosis not present

## 2018-08-13 DIAGNOSIS — M25579 Pain in unspecified ankle and joints of unspecified foot: Secondary | ICD-10-CM | POA: Diagnosis not present

## 2018-08-13 DIAGNOSIS — R0600 Dyspnea, unspecified: Secondary | ICD-10-CM | POA: Diagnosis not present

## 2018-08-17 ENCOUNTER — Telehealth: Payer: Self-pay | Admitting: *Deleted

## 2018-08-17 NOTE — Telephone Encounter (Signed)
REFERRAL SENT TO SCHEDULING AND NOTES ON FILE FROM DR. [facility] [place] [facility] [phone].

## 2018-08-24 DIAGNOSIS — C4372 Malignant melanoma of left lower limb, including hip: Secondary | ICD-10-CM | POA: Diagnosis not present

## 2018-09-09 DIAGNOSIS — C4372 Malignant melanoma of left lower limb, including hip: Secondary | ICD-10-CM | POA: Diagnosis not present

## 2018-09-10 DIAGNOSIS — L989 Disorder of the skin and subcutaneous tissue, unspecified: Secondary | ICD-10-CM | POA: Diagnosis not present

## 2018-09-10 DIAGNOSIS — L905 Scar conditions and fibrosis of skin: Secondary | ICD-10-CM | POA: Diagnosis not present

## 2018-09-21 DIAGNOSIS — D485 Neoplasm of uncertain behavior of skin: Secondary | ICD-10-CM | POA: Diagnosis not present

## 2018-09-24 ENCOUNTER — Telehealth: Payer: Self-pay | Admitting: *Deleted

## 2018-09-24 NOTE — Telephone Encounter (Signed)
   Primary Cardiologist: Will Meredith Leeds, MD   Pt contacted.  History and symptoms reviewed.  Pt will f/u with [facility] provider as scheduled.  Pt. advised that we are restricting visitors at this time and request that only patients present for check-in prior to their appointment.  All other visitors should remain in their car.  If necessary, only one visitor may come with the patient, into the building. For everyone's safety, all patients and visitor entering our practice area should expect to be screened again prior to entering our waiting area.  Becky [name]  09/24/2018 5:01 PM     COVID-19 Pre-Screening Questions:  . In the past 7 to 10 days have you had a cough,  shortness of breath, headache, congestion, fever (100 or greater) body aches, chills, sore throat, or sudden loss of taste or sense of smell? NO . Have you been around anyone with known Covid 19? NO . Have you been around anyone who is awaiting Covid 19 test results in the past 7 to 10 days? NO . Have you been around anyone who has been exposed to Covid 19, or has mentioned symptoms of Covid 19 within the past 7 to 10 days? NO  If you have any concerns/questions about symptoms patients report during screening (either on the phone or at threshold). Contact the provider seeing the patient or DOD for further guidance.  If neither are available contact a member of the leadership team.

## 2018-09-27 ENCOUNTER — Encounter: Payer: Self-pay | Admitting: Cardiology

## 2018-09-27 ENCOUNTER — Encounter: Payer: Self-pay | Admitting: *Deleted

## 2018-09-27 ENCOUNTER — Ambulatory Visit (INDEPENDENT_AMBULATORY_CARE_PROVIDER_SITE_OTHER): Payer: Medicare HMO | Admitting: Cardiology

## 2018-09-27 ENCOUNTER — Other Ambulatory Visit: Payer: Self-pay

## 2018-09-27 VITALS — BP 114/76 | HR 102 | Ht 60.0 in | Wt 185.6 lb

## 2018-09-27 DIAGNOSIS — R0602 Shortness of breath: Secondary | ICD-10-CM

## 2018-09-27 DIAGNOSIS — I4581 Long QT syndrome: Secondary | ICD-10-CM

## 2018-09-27 MED ORDER — NADOLOL 20 MG PO TABS: 20.0000 mg | ORAL_TABLET | Freq: Every day | ORAL | 3 refills | Status: DC

## 2018-09-27 NOTE — Patient Instructions (Addendum)
Medication Instructions:  Your physician recommends that you continue on your current medications as directed. Please refer to the Current Medication list given to you today.  * If you need a refill on your cardiac medications before your next appointment, please call your pharmacy.   Labwork: None ordered  Testing/Procedures: Your physician has requested that you have a lexiscan myoview. For further information please visit [email or web address]. Please follow instruction sheet, as given.  Follow-Up: Your physician wants you to follow-up in: 1 year with Dr. Curt Bears.  You will receive a reminder letter in the mail two months in advance. If you don't receive a letter, please call our office to schedule the follow-up appointment.  Thank you for choosing [facility]!!   Trinidad Curet, RN [phone]  Any Other Special Instructions Will Be Listed Below (If Applicable).

## 2018-09-27 NOTE — Progress Notes (Addendum)
Electrophysiology Office Note   Date:  09/27/2018   ID:  Becky [name], Becky [name] 1950-09-15, MRN [id number]  PCP:  Hulan Fess, MD  Primary Electrophysiologist:  Constance Haw, MD    No chief complaint on file.    History of Present Illness: Becky [name] is a 68 y.o. female who presents today for electrophysiology evaluation.   She presents for evaluation of falls and long QT interval.   Today, denies symptoms of palpitations, chest pain, orthopnea, PND, lower extremity edema, claudication, dizziness, presyncope, syncope, bleeding, or neurologic sequela. The patient is tolerating medications without difficulties.  Currently she is having some shortness of breath.  Her shortness of breath occurs both with rest and with exertion.  She otherwise has no major complaints.  She has been taking the nadolol but has been out of it for the last week.   Past Medical History:  Diagnosis Date  . Chronic kidney disease   . Depression    followed by Dr Toy Care  . Genital herpes   . History of melanoma    followed by derm Dr Delman Cheadle  . Hyperlipidemia   . Hypertension   . Stroke [facility])    Old stroke seen on MRI 12/2009, carotid doppler w minimal plaque, MRI off the head otherwise neg for acute disease.  . Thyroid disease    hypothyroid. Followed by Dr Toy Care  . TIA (transient ischemic attack) 8/08   followed by neurologist Dr Leonie Man   Past Surgical History:  Procedure Laterality Date  . ABDOMINAL HYSTERECTOMY    . APPENDECTOMY    . CHOLECYSTECTOMY    . MELANOMA EXCISION     x2     Current Outpatient Medications  Medication Sig Dispense Refill  . acyclovir (ZOVIRAX) 200 MG capsule Take 200 mg by mouth 5 (five) times daily as needed.     . ALPRAZolam (XANAX) 0.25 MG tablet 3 (three) times daily as needed.   1  . amLODipine (NORVASC) 5 MG tablet Take 5 mg by mouth daily.     Marland Kitchen aspirin EC 325 MG tablet Take 325 mg by mouth 2 (two) times daily.     . Cholecalciferol (VITAMIN  D) 2000 UNITS tablet Take 5,000 Units by mouth daily.     Marland Kitchen Dexmethylphenidate HCl 40 MG CP24 Take 40 mg daily by mouth.    Marland Kitchen FLUoxetine (PROZAC) 40 MG capsule Take 80 mg daily by mouth.    Marland Kitchen L-Methylfolate-Algae (DEPLIN 15 PO) Take 1 tablet daily by mouth.    . levothyroxine (SYNTHROID, LEVOTHROID) 25 MCG tablet Take 25 mcg by mouth daily before breakfast.    . losartan-hydrochlorothiazide (HYZAAR) 100-25 MG per tablet Take 1 tablet by mouth daily.    . nadolol (CORGARD) 20 MG tablet Take 1 tablet (20 mg total) by mouth daily. 90 tablet 3  . OLANZapine (ZYPREXA) 10 MG tablet Take 10 mg by mouth 2 (two) times a day.     . potassium chloride SA (K-DUR,KLOR-CON) 20 MEQ tablet Take 10 mEq by mouth daily.    . pramipexole (MIRAPEX) 0.125 MG tablet   11  . ranitidine (ZANTAC) 150 MG tablet Take 150 mg by mouth 2 (two) times daily.    . rosuvastatin (CRESTOR) 10 MG tablet Take 10 mg by mouth daily.     . traZODone (DESYREL) 50 MG tablet Take 50 mg by mouth at bedtime as needed for sleep.     No current facility-administered medications for this visit.     Allergies:  Penicillins, Iodinated diagnostic agents, Iohexol, and Nsaids   Social History:  The patient  reports that she has never smoked. She has never used smokeless tobacco. She reports that she does not drink alcohol or use drugs.   Family History:  The patient's family history includes Breast cancer in her mother; Dementia in her maternal aunt; Heart attack in her father; Hyperlipidemia in her father and sister; Hypertension in her father and sister.    ROS:  Please see the history of present illness.   Otherwise, review of systems is positive for none.   All other systems are reviewed and negative.   PHYSICAL EXAM: VS:  BP 114/76   Pulse (!) 102   Ht 5' (1.524 m)   Wt 185 lb 9.6 oz (84.2 kg)   SpO2 96%   BMI 36.25 kg/m  , BMI Body mass index is 36.25 kg/m. GEN: Well nourished, well developed, in no acute distress  HEENT:  normal  Neck: no JVD, carotid bruits, or masses Cardiac: RRR; no murmurs, rubs, or gallops,no edema  Respiratory:  clear to auscultation bilaterally, normal work of breathing GI: soft, nontender, nondistended, + BS MS: no deformity or atrophy  Skin: warm and dry Neuro:  Strength and sensation are intact Psych: euthymic mood, full affect  EKG:  EKG is ordered today. Personal review of the ekg ordered shows sinus rhythm, rate 102, QTc 479 ms   Recent Labs: No results found for requested labs within last 8760 hours.    Lipid Panel     Component Value Date/Time   CHOL  12/17/2009 2355    141        ATP III CLASSIFICATION:  <200     mg/dL   Desirable  200-239  mg/dL   Borderline High  >=240    mg/dL   High          TRIG 135 12/17/2009 2355   HDL 40 12/17/2009 2355   CHOLHDL 3.5 12/17/2009 2355   VLDL 27 12/17/2009 2355   LDLCALC  12/17/2009 2355    74        Total Cholesterol/HDL:CHD Risk Coronary Heart Disease Risk Table                     Men   Women  1/2 Average Risk   3.4   3.3  Average Risk       5.0   4.4  2 X Average Risk   9.6   7.1  3 X Average Risk  23.4   11.0        Use the calculated Patient Ratio above and the CHD Risk Table to determine the patient's CHD Risk.        ATP III CLASSIFICATION (LDL):  <100     mg/dL   Optimal  100-129  mg/dL   Near or Above                    Optimal  130-159  mg/dL   Borderline  160-189  mg/dL   High  >190     mg/dL   Very High     Wt Readings from Last 3 Encounters:  09/27/18 185 lb 9.6 oz (84.2 kg)  04/28/18 179 lb (81.2 kg)  01/14/17 175 lb 12.8 oz (79.7 kg)      Other studies Reviewed: Additional studies/ records that were reviewed today include: Epic notes   ASSESSMENT AND PLAN:  1.  Long QTc: Currently on nadolol.  QTC remains prolonged but she has not taken her nadolol yet today.  We [facility] [place] refill this medication.  No ventricular arrhythmias noted.  Would avoid all QT prolonging medications.    2.   Hypertension: Well-controlled  3.  Hyperlipidemia: Continue Crestor.  4.  Shortness of breath: This would be a very atypical anginal equivalent, though I do think that she would need an evaluation.  We [place] Jovel plan for Myoview.  Current medicines are reviewed at length with the patient today.   The patient does not have concerns regarding her medicines.  The following changes were made today:    Labs/ tests ordered today include:  Orders Placed This Encounter  Procedures  . Myocardial Perfusion Imaging  . EKG 12-Lead     Disposition:   FU with Rayni Nemitz 12 months  Signed, Abem Shaddix Meredith Leeds, MD  09/27/2018 12:14 PM     [place] [address] [phone] (office) [phone] (fax)

## 2018-10-01 DIAGNOSIS — Z79899 Other long term (current) drug therapy: Secondary | ICD-10-CM | POA: Diagnosis not present

## 2018-10-04 ENCOUNTER — Telehealth (HOSPITAL_COMMUNITY): Payer: Self-pay | Admitting: *Deleted

## 2018-10-04 NOTE — Telephone Encounter (Signed)
Patient given detailed instructions per Myocardial Perfusion Study Information Sheet for the test on 10/06/18 at 10:45. Patient notified to arrive 15 minutes early and that it is imperative to arrive on time for appointment to keep from having the test rescheduled.  If you need to cancel or reschedule your appointment, please call the office within 24 hours of your appointment. . Patient verbalized understanding.Becky [name]

## 2018-10-06 ENCOUNTER — Other Ambulatory Visit: Payer: Self-pay

## 2018-10-06 ENCOUNTER — Ambulatory Visit (HOSPITAL_COMMUNITY): Payer: Medicare HMO | Attending: Cardiology

## 2018-10-06 VITALS — Ht 60.0 in | Wt 185.0 lb

## 2018-10-06 DIAGNOSIS — R0602 Shortness of breath: Secondary | ICD-10-CM | POA: Diagnosis not present

## 2018-10-06 DIAGNOSIS — R110 Nausea: Secondary | ICD-10-CM | POA: Diagnosis present

## 2018-10-06 LAB — MYOCARDIAL PERFUSION IMAGING
LV dias vol: 37 mL (ref 46–106)
LV sys vol: 12 mL
Peak HR: 108 {beats}/min
Rest HR: 88 {beats}/min
SDS: 3
SRS: 6
SSS: 9
TID: 0.85

## 2018-10-06 MED ORDER — REGADENOSON 0.4 MG/5ML IV SOLN
0.4000 mg | Freq: Once | INTRAVENOUS | Status: AC
  Administered 2018-10-06: 0.4 mg via INTRAVENOUS

## 2018-10-06 MED ORDER — TECHNETIUM TC 99M TETROFOSMIN IV KIT
31.7000 | PACK | Freq: Once | INTRAVENOUS | Status: AC | PRN
  Administered 2018-10-06: 31.7 via INTRAVENOUS
  Filled 2018-10-06: qty 32

## 2018-10-06 MED ORDER — AMINOPHYLLINE 25 MG/ML IV SOLN
75.0000 mg | Freq: Once | INTRAVENOUS | Status: AC
  Administered 2018-10-06: 75 mg via INTRAVENOUS

## 2018-10-06 MED ORDER — TECHNETIUM TC 99M TETROFOSMIN IV KIT
11.0000 | PACK | Freq: Once | INTRAVENOUS | Status: AC | PRN
  Administered 2018-10-06: 11 via INTRAVENOUS
  Filled 2018-10-06: qty 11

## 2018-10-08 IMAGING — US US RENAL
1 series · 14 of 25 positions shown · non-contrast
Comparison: CT Abdomen and Pelvis 08/31/2007.

CLINICAL DATA: 66-year-old female stage 3 chronic kidney disease.

EXAM:
RENAL / URINARY TRACT ULTRASOUND COMPLETE

[Series 1: us renal · 0.23mm/px · 14 of 45 slices shown]
[im 1/45]
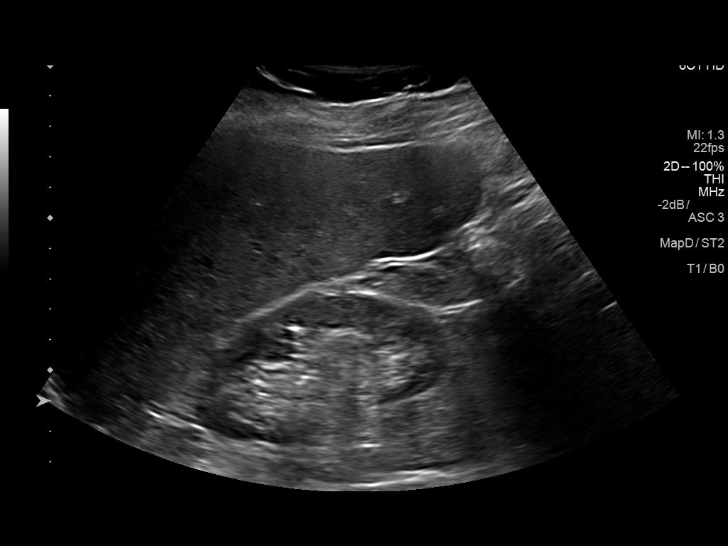
[im 4/45]
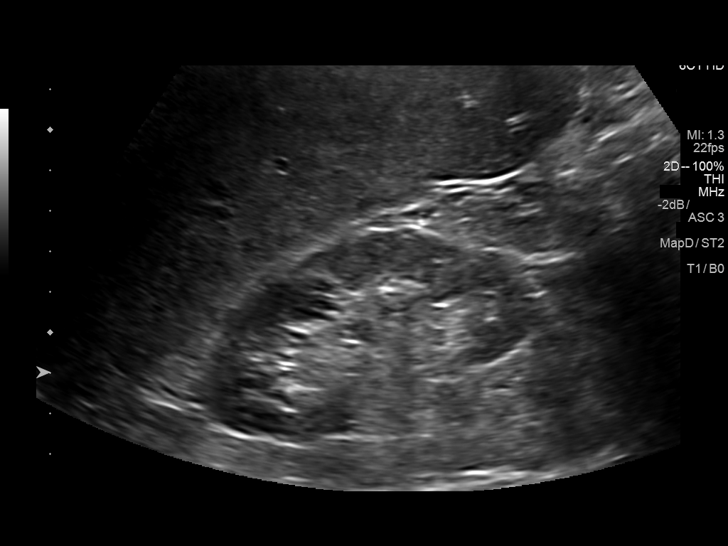
[im 8/45]
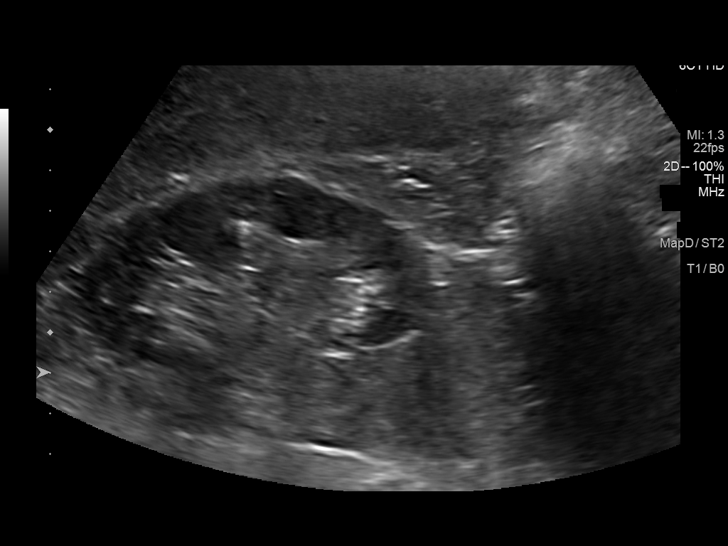
[im 12/45]
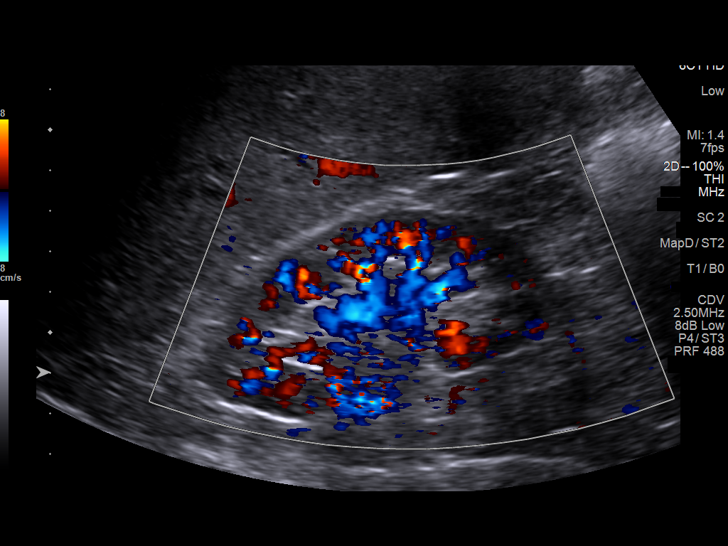
[im 15/45]
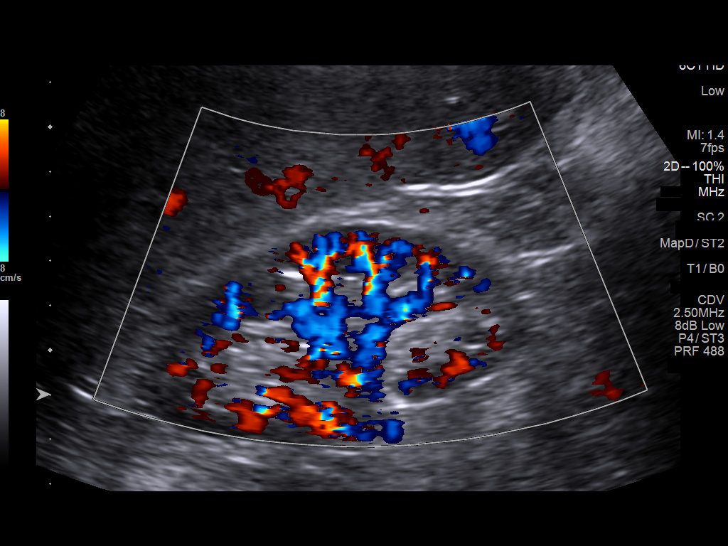
[im 17/45]
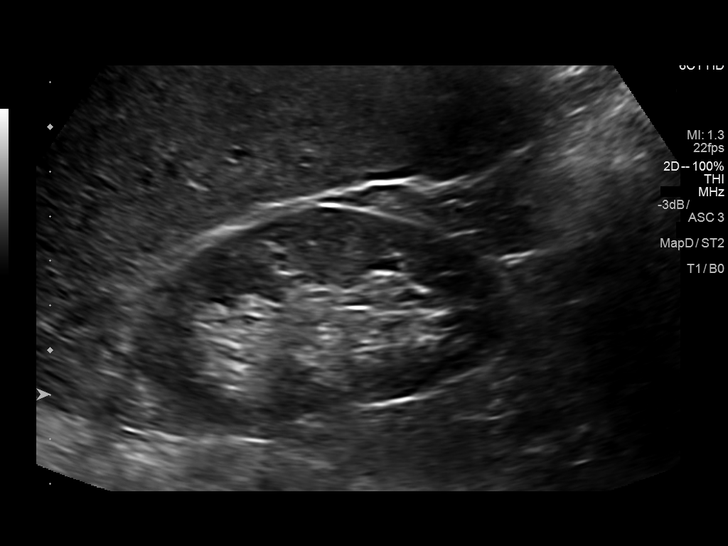
[im 21/45]
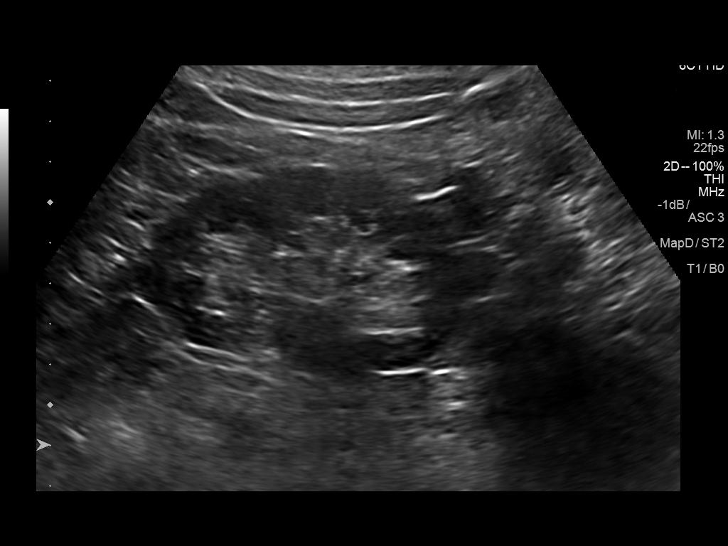
[im 24/45]
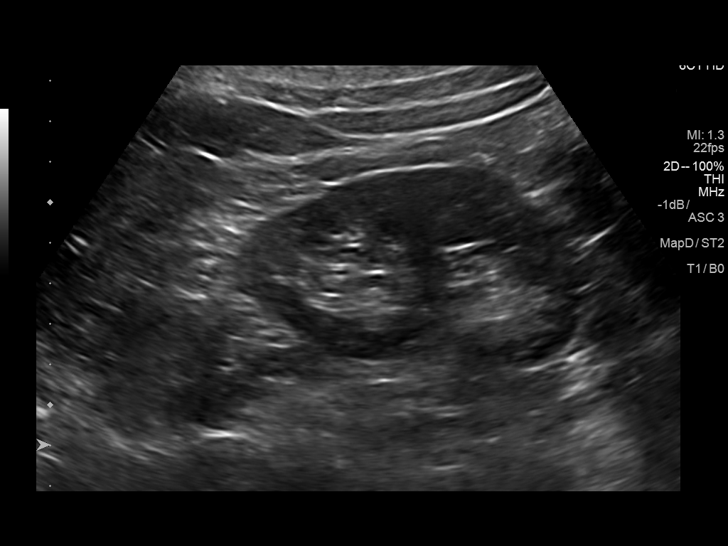
[im 28/45]
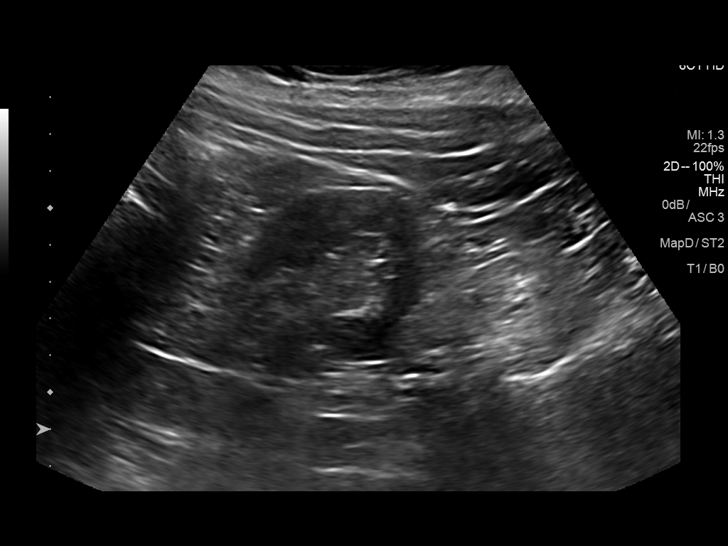
[im 30/45]
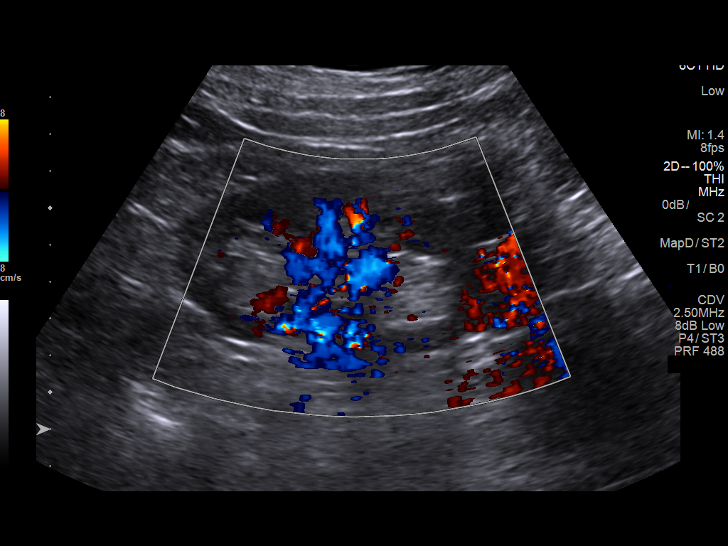
[im 34/45]
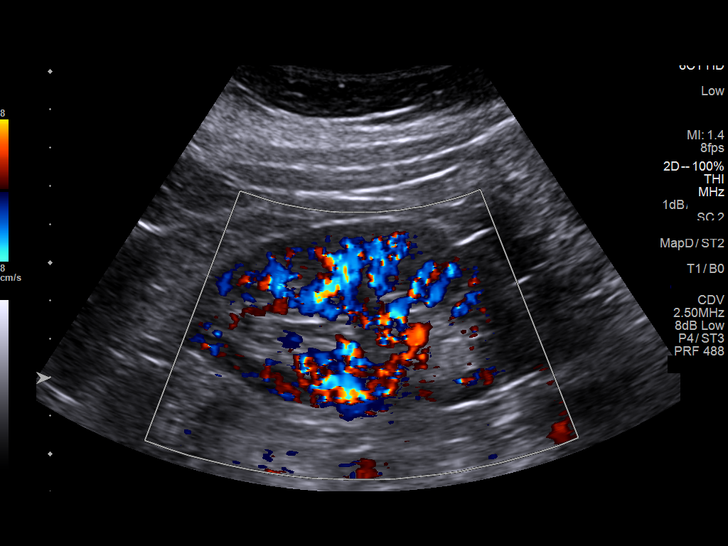
[im 37/45]
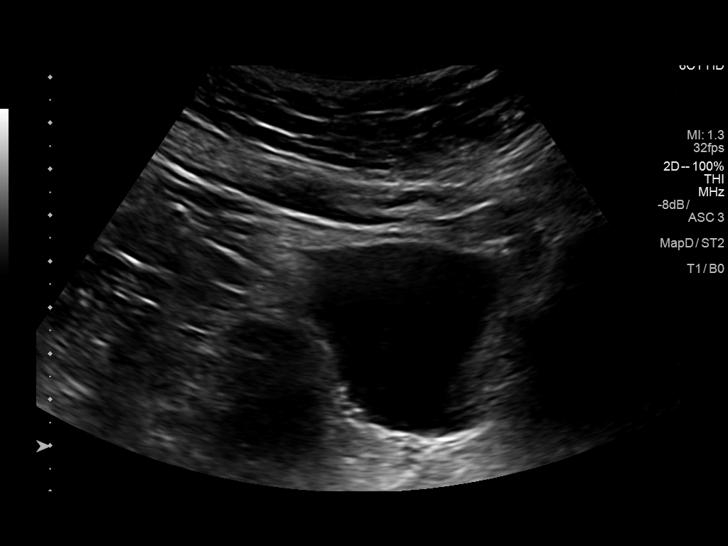
[im 41/45]
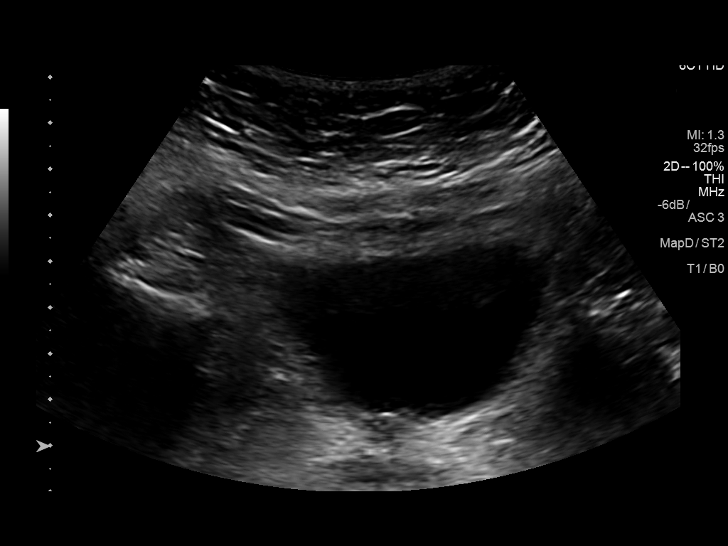
[im 45/45]
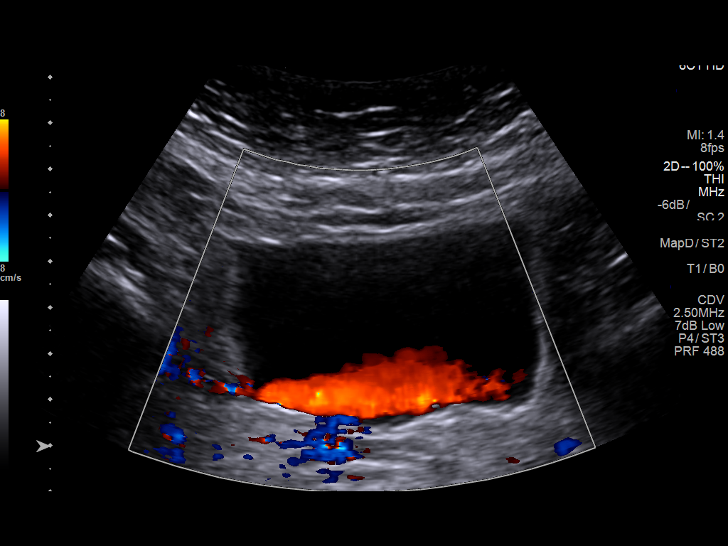

[14 of 25 positions shown; findings below may reference images not displayed]

FINDINGS: Right Kidney:

Length: 9.0 cm. Probable chronic right renal midpole calculus
appearing echogenic on image 18. Echogenicity within normal limits.
No mass or hydronephrosis visualized.

Left Kidney:

Length: 9.0 cm. Echogenicity within normal limits. No mass or
hydronephrosis visualized.

Bladder:

Appears normal for degree of bladder distention.
IMPRESSION: 1. Chronic right midpole nephrolithiasis.
2. Otherwise negative ultrasound appearance of the kidneys and
urinary bladder.

## 2018-10-27 DIAGNOSIS — R799 Abnormal finding of blood chemistry, unspecified: Secondary | ICD-10-CM | POA: Diagnosis not present

## 2018-11-02 ENCOUNTER — Other Ambulatory Visit: Payer: Self-pay | Admitting: Family Medicine

## 2018-11-02 DIAGNOSIS — R748 Abnormal levels of other serum enzymes: Secondary | ICD-10-CM

## 2018-11-10 ENCOUNTER — Ambulatory Visit
Admission: RE | Admit: 2018-11-10 | Discharge: 2018-11-10 | Disposition: A | Discharge status: Home / Self Care | Payer: Medicare HMO | Source: Ambulatory Visit | Attending: Family Medicine | Admitting: Family Medicine

## 2018-11-10 DIAGNOSIS — N200 Calculus of kidney: Secondary | ICD-10-CM | POA: Diagnosis not present

## 2018-11-10 DIAGNOSIS — R748 Abnormal levels of other serum enzymes: Secondary | ICD-10-CM

## 2018-11-17 DIAGNOSIS — R69 Illness, unspecified: Secondary | ICD-10-CM | POA: Diagnosis not present

## 2018-11-17 DIAGNOSIS — R748 Abnormal levels of other serum enzymes: Secondary | ICD-10-CM | POA: Diagnosis not present

## 2018-11-19 DIAGNOSIS — R69 Illness, unspecified: Secondary | ICD-10-CM | POA: Diagnosis not present

## 2018-11-22 DIAGNOSIS — R69 Illness, unspecified: Secondary | ICD-10-CM | POA: Diagnosis not present

## 2018-11-23 DIAGNOSIS — R69 Illness, unspecified: Secondary | ICD-10-CM | POA: Diagnosis not present

## 2018-11-24 DIAGNOSIS — R69 Illness, unspecified: Secondary | ICD-10-CM | POA: Diagnosis not present

## 2018-11-25 DIAGNOSIS — R69 Illness, unspecified: Secondary | ICD-10-CM | POA: Diagnosis not present

## 2018-11-29 DIAGNOSIS — R69 Illness, unspecified: Secondary | ICD-10-CM | POA: Diagnosis not present

## 2018-11-30 DIAGNOSIS — R69 Illness, unspecified: Secondary | ICD-10-CM | POA: Diagnosis not present

## 2018-12-01 DIAGNOSIS — R69 Illness, unspecified: Secondary | ICD-10-CM | POA: Diagnosis not present

## 2018-12-02 DIAGNOSIS — R69 Illness, unspecified: Secondary | ICD-10-CM | POA: Diagnosis not present

## 2018-12-06 DIAGNOSIS — R69 Illness, unspecified: Secondary | ICD-10-CM | POA: Diagnosis not present

## 2018-12-07 DIAGNOSIS — R69 Illness, unspecified: Secondary | ICD-10-CM | POA: Diagnosis not present

## 2018-12-08 DIAGNOSIS — R69 Illness, unspecified: Secondary | ICD-10-CM | POA: Diagnosis not present

## 2018-12-10 DIAGNOSIS — R69 Illness, unspecified: Secondary | ICD-10-CM | POA: Diagnosis not present

## 2018-12-16 DIAGNOSIS — E039 Hypothyroidism, unspecified: Secondary | ICD-10-CM | POA: Diagnosis not present

## 2018-12-16 DIAGNOSIS — Z85820 Personal history of malignant melanoma of skin: Secondary | ICD-10-CM | POA: Diagnosis not present

## 2018-12-16 DIAGNOSIS — Z86010 Personal history of colonic polyps: Secondary | ICD-10-CM | POA: Diagnosis not present

## 2018-12-16 DIAGNOSIS — Z23 Encounter for immunization: Secondary | ICD-10-CM | POA: Diagnosis not present

## 2018-12-16 DIAGNOSIS — R7301 Impaired fasting glucose: Secondary | ICD-10-CM | POA: Diagnosis not present

## 2018-12-16 DIAGNOSIS — R9431 Abnormal electrocardiogram [ECG] [EKG]: Secondary | ICD-10-CM | POA: Diagnosis not present

## 2018-12-16 DIAGNOSIS — Z0000 Encounter for general adult medical examination without abnormal findings: Secondary | ICD-10-CM | POA: Diagnosis not present

## 2018-12-16 DIAGNOSIS — Z8673 Personal history of transient ischemic attack (TIA), and cerebral infarction without residual deficits: Secondary | ICD-10-CM | POA: Diagnosis not present

## 2018-12-16 DIAGNOSIS — I10 Essential (primary) hypertension: Secondary | ICD-10-CM | POA: Diagnosis not present

## 2018-12-16 DIAGNOSIS — R413 Other amnesia: Secondary | ICD-10-CM | POA: Diagnosis not present

## 2018-12-16 DIAGNOSIS — R69 Illness, unspecified: Secondary | ICD-10-CM | POA: Diagnosis not present

## 2018-12-17 DIAGNOSIS — R69 Illness, unspecified: Secondary | ICD-10-CM | POA: Diagnosis not present

## 2018-12-20 DIAGNOSIS — R69 Illness, unspecified: Secondary | ICD-10-CM | POA: Diagnosis not present

## 2018-12-21 DIAGNOSIS — R69 Illness, unspecified: Secondary | ICD-10-CM | POA: Diagnosis not present

## 2018-12-21 DIAGNOSIS — R7401 Elevation of levels of liver transaminase levels: Secondary | ICD-10-CM | POA: Diagnosis not present

## 2018-12-22 DIAGNOSIS — R69 Illness, unspecified: Secondary | ICD-10-CM | POA: Diagnosis not present

## 2018-12-23 DIAGNOSIS — R69 Illness, unspecified: Secondary | ICD-10-CM | POA: Diagnosis not present

## 2018-12-27 DIAGNOSIS — R69 Illness, unspecified: Secondary | ICD-10-CM | POA: Diagnosis not present

## 2018-12-28 DIAGNOSIS — L821 Other seborrheic keratosis: Secondary | ICD-10-CM | POA: Diagnosis not present

## 2018-12-28 DIAGNOSIS — Z86018 Personal history of other benign neoplasm: Secondary | ICD-10-CM | POA: Diagnosis not present

## 2018-12-28 DIAGNOSIS — Z23 Encounter for immunization: Secondary | ICD-10-CM | POA: Diagnosis not present

## 2018-12-28 DIAGNOSIS — D225 Melanocytic nevi of trunk: Secondary | ICD-10-CM | POA: Diagnosis not present

## 2018-12-28 DIAGNOSIS — Z85820 Personal history of malignant melanoma of skin: Secondary | ICD-10-CM | POA: Diagnosis not present

## 2018-12-28 DIAGNOSIS — D485 Neoplasm of uncertain behavior of skin: Secondary | ICD-10-CM | POA: Diagnosis not present

## 2018-12-29 DIAGNOSIS — R69 Illness, unspecified: Secondary | ICD-10-CM | POA: Diagnosis not present

## 2019-01-03 DIAGNOSIS — R69 Illness, unspecified: Secondary | ICD-10-CM | POA: Diagnosis not present

## 2019-01-04 DIAGNOSIS — R69 Illness, unspecified: Secondary | ICD-10-CM | POA: Diagnosis not present

## 2019-01-06 DIAGNOSIS — R69 Illness, unspecified: Secondary | ICD-10-CM | POA: Diagnosis not present

## 2019-01-07 DIAGNOSIS — R69 Illness, unspecified: Secondary | ICD-10-CM | POA: Diagnosis not present

## 2019-01-10 DIAGNOSIS — R69 Illness, unspecified: Secondary | ICD-10-CM | POA: Diagnosis not present

## 2019-01-12 DIAGNOSIS — R69 Illness, unspecified: Secondary | ICD-10-CM | POA: Diagnosis not present

## 2019-01-14 DIAGNOSIS — R69 Illness, unspecified: Secondary | ICD-10-CM | POA: Diagnosis not present

## 2019-01-17 ENCOUNTER — Other Ambulatory Visit: Payer: Self-pay | Admitting: Internal Medicine

## 2019-01-17 DIAGNOSIS — R748 Abnormal levels of other serum enzymes: Secondary | ICD-10-CM

## 2019-01-18 DIAGNOSIS — R69 Illness, unspecified: Secondary | ICD-10-CM | POA: Diagnosis not present

## 2019-01-19 DIAGNOSIS — R69 Illness, unspecified: Secondary | ICD-10-CM | POA: Diagnosis not present

## 2019-01-21 DIAGNOSIS — Z1231 Encounter for screening mammogram for malignant neoplasm of breast: Secondary | ICD-10-CM | POA: Diagnosis not present

## 2019-01-21 DIAGNOSIS — R69 Illness, unspecified: Secondary | ICD-10-CM | POA: Diagnosis not present

## 2019-01-21 DIAGNOSIS — Z803 Family history of malignant neoplasm of breast: Secondary | ICD-10-CM | POA: Diagnosis not present

## 2019-01-31 ENCOUNTER — Other Ambulatory Visit: Payer: Medicare HMO

## 2019-02-01 DIAGNOSIS — R69 Illness, unspecified: Secondary | ICD-10-CM | POA: Diagnosis not present

## 2019-02-07 ENCOUNTER — Ambulatory Visit
Admission: RE | Admit: 2019-02-07 | Discharge: 2019-02-07 | Disposition: A | Discharge status: Home / Self Care | Payer: Medicare HMO | Source: Ambulatory Visit | Attending: Internal Medicine | Admitting: Internal Medicine

## 2019-02-07 DIAGNOSIS — R748 Abnormal levels of other serum enzymes: Secondary | ICD-10-CM

## 2019-02-07 DIAGNOSIS — K760 Fatty (change of) liver, not elsewhere classified: Secondary | ICD-10-CM | POA: Diagnosis not present

## 2019-02-08 DIAGNOSIS — R69 Illness, unspecified: Secondary | ICD-10-CM | POA: Diagnosis not present

## 2019-02-09 DIAGNOSIS — R7401 Elevation of levels of liver transaminase levels: Secondary | ICD-10-CM | POA: Diagnosis not present

## 2019-02-11 DIAGNOSIS — R69 Illness, unspecified: Secondary | ICD-10-CM | POA: Diagnosis not present

## 2019-03-08 DIAGNOSIS — E785 Hyperlipidemia, unspecified: Secondary | ICD-10-CM | POA: Diagnosis not present

## 2019-03-08 DIAGNOSIS — N1831 Chronic kidney disease, stage 3a: Secondary | ICD-10-CM | POA: Diagnosis not present

## 2019-03-08 DIAGNOSIS — R69 Illness, unspecified: Secondary | ICD-10-CM | POA: Diagnosis not present

## 2019-03-08 DIAGNOSIS — K760 Fatty (change of) liver, not elsewhere classified: Secondary | ICD-10-CM | POA: Diagnosis not present

## 2019-03-08 DIAGNOSIS — I129 Hypertensive chronic kidney disease with stage 1 through stage 4 chronic kidney disease, or unspecified chronic kidney disease: Secondary | ICD-10-CM | POA: Diagnosis not present

## 2019-03-08 DIAGNOSIS — Z87442 Personal history of urinary calculi: Secondary | ICD-10-CM | POA: Diagnosis not present

## 2019-03-08 DIAGNOSIS — G3184 Mild cognitive impairment, so stated: Secondary | ICD-10-CM | POA: Diagnosis not present

## 2019-03-31 DIAGNOSIS — L821 Other seborrheic keratosis: Secondary | ICD-10-CM | POA: Diagnosis not present

## 2019-03-31 DIAGNOSIS — L578 Other skin changes due to chronic exposure to nonionizing radiation: Secondary | ICD-10-CM | POA: Diagnosis not present

## 2019-03-31 DIAGNOSIS — Z23 Encounter for immunization: Secondary | ICD-10-CM | POA: Diagnosis not present

## 2019-03-31 DIAGNOSIS — Z85820 Personal history of malignant melanoma of skin: Secondary | ICD-10-CM | POA: Diagnosis not present

## 2019-03-31 DIAGNOSIS — Z86018 Personal history of other benign neoplasm: Secondary | ICD-10-CM | POA: Diagnosis not present

## 2019-03-31 DIAGNOSIS — D225 Melanocytic nevi of trunk: Secondary | ICD-10-CM | POA: Diagnosis not present

## 2019-03-31 DIAGNOSIS — B353 Tinea pedis: Secondary | ICD-10-CM | POA: Diagnosis not present

## 2019-04-11 DIAGNOSIS — R748 Abnormal levels of other serum enzymes: Secondary | ICD-10-CM | POA: Diagnosis not present

## 2019-05-02 ENCOUNTER — Telehealth: Payer: Self-pay

## 2019-05-02 ENCOUNTER — Ambulatory Visit: Payer: Medicare HMO | Admitting: Adult Health

## 2019-05-02 NOTE — Telephone Encounter (Signed)
Patient was a no call/no show for their appointment today.   

## 2019-05-03 ENCOUNTER — Encounter: Payer: Self-pay | Admitting: Adult Health

## 2019-05-11 DIAGNOSIS — R7401 Elevation of levels of liver transaminase levels: Secondary | ICD-10-CM | POA: Diagnosis not present

## 2019-06-16 DIAGNOSIS — H2511 Age-related nuclear cataract, right eye: Secondary | ICD-10-CM | POA: Diagnosis not present

## 2019-06-16 DIAGNOSIS — H26492 Other secondary cataract, left eye: Secondary | ICD-10-CM | POA: Diagnosis not present

## 2019-06-16 DIAGNOSIS — H52203 Unspecified astigmatism, bilateral: Secondary | ICD-10-CM | POA: Diagnosis not present

## 2019-06-16 DIAGNOSIS — Z961 Presence of intraocular lens: Secondary | ICD-10-CM | POA: Diagnosis not present

## 2019-06-20 ENCOUNTER — Other Ambulatory Visit: Payer: Self-pay

## 2019-06-20 MED ORDER — NADOLOL 20 MG PO TABS: 20.0000 mg | ORAL_TABLET | Freq: Every day | ORAL | 0 refills | Status: DC

## 2019-06-20 NOTE — Telephone Encounter (Signed)
Pt's medication was sent to pt's pharmacy as requested. Confirmation received.  °

## 2019-06-27 ENCOUNTER — Other Ambulatory Visit: Payer: Self-pay

## 2019-06-27 ENCOUNTER — Telehealth: Payer: Self-pay | Admitting: Cardiology

## 2019-06-27 MED ORDER — NADOLOL 20 MG PO TABS: 20.0000 mg | ORAL_TABLET | Freq: Every day | ORAL | 0 refills | Status: DC

## 2019-06-27 NOTE — Telephone Encounter (Signed)
*  STAT* If patient is at the pharmacy, call can be transferred to refill team.   1. Which medications need to be refilled? (please list name of each medication and dose if known) nadolol (CORGARD) 20 MG tablet  2. Which pharmacy/location (including street and city if local pharmacy) is medication to be sent to? [place] - fax number she gave me was [phone]  3. Do they need a 30 day or 90 day supply? 90  Patient has an appt with Dr. Curt Bears on 08/16/19

## 2019-06-29 ENCOUNTER — Encounter: Payer: Self-pay | Admitting: Adult Health

## 2019-06-29 ENCOUNTER — Other Ambulatory Visit: Payer: Self-pay

## 2019-06-29 ENCOUNTER — Ambulatory Visit: Payer: Medicare HMO | Admitting: Adult Health

## 2019-06-29 VITALS — BP 137/85 | HR 85 | Temp 97.5°F | Ht 60.0 in | Wt 166.0 lb

## 2019-06-29 DIAGNOSIS — G3184 Mild cognitive impairment, so stated: Secondary | ICD-10-CM | POA: Diagnosis not present

## 2019-06-29 NOTE — Patient Instructions (Addendum)
Your Plan:  No changes made today  Continue current treatment plan for secondary stroke prevention and ongoing follow-up with PCP for monitoring management  Importance of managing stroke risk factors, continue healthy diet, adequate sleep and routine exercise which can help with cognitive concerns  Continue to follow with your PCP and gastro in regards to liver function - during the interval time, recommend use of omega 3 fatty acid    Follow-up as needed as overall stable from a neurological standpoint     Thank you for coming to see [place] at [facility]. I hope we have been able to provide you high quality care today.  You may receive a patient satisfaction survey over the next few weeks. We would appreciate your feedback and comments so that we may continue to improve ourselves and the health of our patients.

## 2019-06-29 NOTE — Progress Notes (Signed)
[facility] [address] [place]. [place] [address] [phone]       OFFICE FOLLOW UP VISIT NOTE  Becky [name] Date of Birth:  Jan 10, 1951 Medical Record Number:  [id number]   Referring MD:  Hulan Fess  Reason for Referral:  Cognitive impairment and balance problems [facility] provider: Dr. Leonie Man   Chief complaint: Chief Complaint  Patient presents with  . Follow-up    rm 9,mild cognitive impairment, alone      HPI:  Today, 06/29/2019, Becky [name] returns for follow-up regarding mild cognitive impairment with prior stroke history.  Initial evaluation with Dr. Leonie Man in 09/2015 due to progressive memory and cognitive difficulties along with increase frequency of falls and balance problems.  Extensive imaging and lab work-up unremarkable for any reversible causes.  Memory has been stable since prior visit 1 year ago with Dr. Leonie Man without worsening.  No reoccurring gait or balance concerns.  She denies recurrent stroke or TIA symptoms since 2008.  Continues on aspirin for secondary stroke prevention.  Crestor currently on hold due to elevated liver enzymes currently being followed by [facility] gastroenterology and PCP who plans on repeat lab work around June/July.  Blood pressure today 137/85.  No concerns at this time.      ROS:   14 system review of systems is positive for no complaints and all other systems negative  PMH:  Past Medical History:  Diagnosis Date  . Chronic kidney disease   . Depression    followed by Dr Toy Care  . Genital herpes   . History of melanoma    followed by derm Dr Delman Cheadle  . Hyperlipidemia   . Hypertension   . Stroke [facility])    Old stroke seen on MRI 12/2009, carotid doppler w minimal plaque, MRI off the head otherwise neg for acute disease.  . Thyroid disease    hypothyroid. Followed by Dr Toy Care  . TIA (transient ischemic attack) 8/08   followed by neurologist Dr Leonie Man    Social History:  Social History    Socioeconomic History  . Marital status: Single    Spouse name: Not on file  . Number of children: Not on file  . Years of education: Not on file  . Highest education level: Not on file  Occupational History  . Not on file  Tobacco Use  . Smoking status: Never Smoker  . Smokeless tobacco: Never Used  Substance and Sexual Activity  . Alcohol use: No  . Drug use: No  . Sexual activity: Never  Other Topics Concern  . Not on file  Social History Narrative  . Not on file   Social Determinants of Health   Financial Resource Strain:   . Difficulty of Paying Living Expenses:   Food Insecurity:   . Worried About Charity fundraiser in the Last Year:   . Arboriculturist in the Last Year:   Transportation Needs:   . Film/video editor (Medical):   Marland Kitchen Lack of Transportation (Non-Medical):   Physical Activity:   . Days of Exercise per Week:   . Minutes of Exercise per Session:   Stress:   . Feeling of Stress :   Social Connections:   . Frequency of Communication with Friends and Family:   . Frequency of Social Gatherings with Friends and Family:   . Attends Religious Services:   . Active Member of Clubs or Organizations:   . Attends Archivist Meetings:   Marland Kitchen Marital  Status:   Intimate Partner Violence:   . Fear of Current or Ex-Partner:   . Emotionally Abused:   Marland Kitchen Physically Abused:   . Sexually Abused:     Medications:   Current Outpatient Medications on File Prior to Visit  Medication Sig Dispense Refill  . acyclovir (ZOVIRAX) 200 MG capsule Take 200 mg by mouth 5 (five) times daily as needed.     . ALPRAZolam (XANAX) 0.25 MG tablet 3 (three) times daily as needed.   1  . amLODipine (NORVASC) 5 MG tablet Take 5 mg by mouth daily.     Marland Kitchen aspirin EC 325 MG tablet Take 325 mg by mouth 2 (two) times daily.     . Cholecalciferol (VITAMIN D) 2000 UNITS tablet Take 5,000 Units by mouth daily.     Marland Kitchen Dexmethylphenidate HCl 40 MG CP24 Take 40 mg daily by mouth.     Marland Kitchen FLUoxetine (PROZAC) 40 MG capsule Take 80 mg daily by mouth.    Marland Kitchen L-Methylfolate-Algae (DEPLIN 15 PO) Take 1 tablet daily by mouth.    . levothyroxine (SYNTHROID, LEVOTHROID) 25 MCG tablet Take 25 mcg by mouth daily before breakfast.    . losartan-hydrochlorothiazide (HYZAAR) 100-25 MG per tablet Take 1 tablet by mouth daily.    . nadolol (CORGARD) 20 MG tablet Take 1 tablet (20 mg total) by mouth daily. 90 tablet 0  . OLANZapine (ZYPREXA) 10 MG tablet Take 10 mg by mouth 2 (two) times a day.     . potassium chloride SA (K-DUR,KLOR-CON) 20 MEQ tablet Take 10 mEq by mouth daily.    . pramipexole (MIRAPEX) 0.125 MG tablet   11  . ranitidine (ZANTAC) 150 MG tablet Take 150 mg by mouth 2 (two) times daily.    . rosuvastatin (CRESTOR) 10 MG tablet Take 10 mg by mouth daily.     . traZODone (DESYREL) 50 MG tablet Take 50 mg by mouth at bedtime as needed for sleep.     No current facility-administered medications on file prior to visit.    Allergies:   Allergies  Allergen Reactions  . Penicillins Anaphylaxis  . Iodinated Diagnostic Agents Other (See Comments)  . Iohexol      Desc: iodine   . Nsaids     CKD     Vitals Today's Vitals   06/29/19 1447  BP: 137/85  Pulse: 85  Temp: (!) 97.5 F (36.4 C)  Weight: 166 lb (75.3 kg)  Height: 5' (1.524 m)   Body mass index is 32.42 kg/m.  Physical Exam General: well developed, well nourished, Middle-aged Caucasian lady seated, in no evident distress Head: head normocephalic and atraumatic.   Neck: supple with no carotid or supraclavicular bruits Cardiovascular: regular rate and rhythm, no murmurs Musculoskeletal: no deformity Skin:  no rash/petichiae Vascular:  Normal pulses all extremities  Neurologic Exam Mental Status: Awake and fully alert. Oriented to place and time. Recent and remote memory intact. Attention span, concentration and fund of knowledge appropriate. Mood and affect appropriate.  Serial additions good.  Animal  naming test 13. Clock drawing 4/4.  Recall 3/3. Cranial Nerves: Pupils equal, briskly reactive to light. Extraocular movements full without nystagmus. Visual fields full to confrontation. Hearing intact. Facial sensation intact. Face, tongue, palate moves normally and symmetrically.  Motor: Normal bulk and tone. Normal strength in all tested extremity muscles. Sensory.: intact to touch , pinprick , position and vibratory sensation.  Coordination: Rapid alternating movements normal in all extremities. Finger-to-nose and heel-to-shin performed accurately bilaterally. Gait  and Station: Arises from chair without difficulty. Stance is normal. Gait demonstrates normal stride length and balance without use of assistive device Reflexes: 3+ brisk and symmetric. Toes downgoing.       ASSESSMENT: 34 year lady with long-standing history of anxiety and depression with recent cognitive impairment and memory difficulties likely due to mild cognitive impairment which has been stable without worsening over the past couple of years.  Remote history of left brain lacunar infarct in 2008 with vascular risk factors of hypertension and hyperlipidemia.    PLAN:  Memory has been overall stable without worsening and only occasional age-related memory concerns.  Highly encouraged continued management of stroke risk factors, maintaining a healthy diet, adequate sleep and routine exercise.  Crestor has been placed on hold over the past 6 months due to elevated liver enzymes currently being followed by [facility] gastroenterology with plans on repeating LFTs around June.  Recommend use of omega-3 fatty acid during the interval time. Continue aspirin for stroke prevention and strict control of hypertension with blood pressure goal below 130/90, lipids with LDL cholesterol goal below 70 mg percent.    Overall stable from neurological standpoint without further recommendations or current treatments therefore recommend  follow-up as needed   I spent 23 minutes of face-to-face and non-face-to-face time with patient.  This included previsit chart review, lab review, study review, order entry, electronic health record documentation, patient education    Frann Rider, [facility]  [facility] [address] [place], [place] [address]  Phone [phone] Fax [phone] Note: This document was prepared with digital dictation and possible smart phrase technology. Any transcriptional errors that result from this process are unintentional.

## 2019-07-04 NOTE — Progress Notes (Signed)
I agree with the above plan 

## 2019-07-07 DIAGNOSIS — D224 Melanocytic nevi of scalp and neck: Secondary | ICD-10-CM | POA: Diagnosis not present

## 2019-07-07 DIAGNOSIS — L814 Other melanin hyperpigmentation: Secondary | ICD-10-CM | POA: Diagnosis not present

## 2019-07-07 DIAGNOSIS — Z86018 Personal history of other benign neoplasm: Secondary | ICD-10-CM | POA: Diagnosis not present

## 2019-07-07 DIAGNOSIS — D225 Melanocytic nevi of trunk: Secondary | ICD-10-CM | POA: Diagnosis not present

## 2019-07-07 DIAGNOSIS — L578 Other skin changes due to chronic exposure to nonionizing radiation: Secondary | ICD-10-CM | POA: Diagnosis not present

## 2019-07-07 DIAGNOSIS — L821 Other seborrheic keratosis: Secondary | ICD-10-CM | POA: Diagnosis not present

## 2019-07-07 DIAGNOSIS — D485 Neoplasm of uncertain behavior of skin: Secondary | ICD-10-CM | POA: Diagnosis not present

## 2019-07-07 DIAGNOSIS — L570 Actinic keratosis: Secondary | ICD-10-CM | POA: Diagnosis not present

## 2019-07-07 DIAGNOSIS — Z85820 Personal history of malignant melanoma of skin: Secondary | ICD-10-CM | POA: Diagnosis not present

## 2019-08-16 ENCOUNTER — Ambulatory Visit: Payer: Medicare HMO | Admitting: Cardiology

## 2019-08-19 DIAGNOSIS — Z20822 Contact with and (suspected) exposure to covid-19: Secondary | ICD-10-CM | POA: Diagnosis not present

## 2019-08-19 DIAGNOSIS — Z03818 Encounter for observation for suspected exposure to other biological agents ruled out: Secondary | ICD-10-CM | POA: Diagnosis not present

## 2019-08-24 DIAGNOSIS — R7401 Elevation of levels of liver transaminase levels: Secondary | ICD-10-CM | POA: Diagnosis not present

## 2019-08-26 DIAGNOSIS — R7401 Elevation of levels of liver transaminase levels: Secondary | ICD-10-CM | POA: Diagnosis not present

## 2019-08-26 DIAGNOSIS — K219 Gastro-esophageal reflux disease without esophagitis: Secondary | ICD-10-CM | POA: Diagnosis not present

## 2019-08-26 DIAGNOSIS — K760 Fatty (change of) liver, not elsewhere classified: Secondary | ICD-10-CM | POA: Diagnosis not present

## 2019-08-26 DIAGNOSIS — R110 Nausea: Secondary | ICD-10-CM | POA: Diagnosis not present

## 2019-08-26 DIAGNOSIS — R634 Abnormal weight loss: Secondary | ICD-10-CM | POA: Diagnosis not present

## 2019-08-30 DIAGNOSIS — I639 Cerebral infarction, unspecified: Secondary | ICD-10-CM | POA: Diagnosis not present

## 2019-08-30 DIAGNOSIS — E7800 Pure hypercholesterolemia, unspecified: Secondary | ICD-10-CM | POA: Diagnosis not present

## 2019-08-30 DIAGNOSIS — R69 Illness, unspecified: Secondary | ICD-10-CM | POA: Diagnosis not present

## 2019-08-30 DIAGNOSIS — E039 Hypothyroidism, unspecified: Secondary | ICD-10-CM | POA: Diagnosis not present

## 2019-08-30 DIAGNOSIS — I10 Essential (primary) hypertension: Secondary | ICD-10-CM | POA: Diagnosis not present

## 2019-08-30 DIAGNOSIS — N189 Chronic kidney disease, unspecified: Secondary | ICD-10-CM | POA: Diagnosis not present

## 2019-08-30 DIAGNOSIS — M8580 Other specified disorders of bone density and structure, unspecified site: Secondary | ICD-10-CM | POA: Diagnosis not present

## 2019-09-02 DIAGNOSIS — R110 Nausea: Secondary | ICD-10-CM | POA: Diagnosis not present

## 2019-09-02 DIAGNOSIS — E039 Hypothyroidism, unspecified: Secondary | ICD-10-CM | POA: Diagnosis not present

## 2019-09-02 DIAGNOSIS — I10 Essential (primary) hypertension: Secondary | ICD-10-CM | POA: Diagnosis not present

## 2019-09-02 DIAGNOSIS — N189 Chronic kidney disease, unspecified: Secondary | ICD-10-CM | POA: Diagnosis not present

## 2019-09-02 DIAGNOSIS — R5383 Other fatigue: Secondary | ICD-10-CM | POA: Diagnosis not present

## 2019-09-13 ENCOUNTER — Telehealth: Payer: Self-pay | Admitting: Cardiology

## 2019-09-13 MED ORDER — NADOLOL 20 MG PO TABS: 20.0000 mg | ORAL_TABLET | Freq: Every day | ORAL | 0 refills | Status: DC

## 2019-09-13 NOTE — Telephone Encounter (Signed)
*  STAT* If patient is at the pharmacy, call can be transferred to refill team.   1. Which medications need to be refilled? (please list name of each medication and dose if known) nadolol (CORGARD) 20 MG tablet  2. Which pharmacy/location (including street and city if local pharmacy) is medication to be sent to? [facility]   3. Do they need a 30 day or 90 day supply? 90 day supply

## 2019-09-13 NOTE — Telephone Encounter (Signed)
Pt's medication was sent to pt's pharmacy as requested. Confirmation received.  °

## 2019-10-01 DIAGNOSIS — I10 Essential (primary) hypertension: Secondary | ICD-10-CM | POA: Diagnosis not present

## 2019-10-01 DIAGNOSIS — I639 Cerebral infarction, unspecified: Secondary | ICD-10-CM | POA: Diagnosis not present

## 2019-10-01 DIAGNOSIS — R69 Illness, unspecified: Secondary | ICD-10-CM | POA: Diagnosis not present

## 2019-10-01 DIAGNOSIS — E039 Hypothyroidism, unspecified: Secondary | ICD-10-CM | POA: Diagnosis not present

## 2019-10-01 DIAGNOSIS — E7800 Pure hypercholesterolemia, unspecified: Secondary | ICD-10-CM | POA: Diagnosis not present

## 2019-10-01 DIAGNOSIS — M8580 Other specified disorders of bone density and structure, unspecified site: Secondary | ICD-10-CM | POA: Diagnosis not present

## 2019-10-01 DIAGNOSIS — N189 Chronic kidney disease, unspecified: Secondary | ICD-10-CM | POA: Diagnosis not present

## 2019-10-12 ENCOUNTER — Other Ambulatory Visit: Payer: Self-pay | Admitting: Cardiology

## 2019-10-17 NOTE — Progress Notes (Signed)
Cardiology Office Note Date:  10/18/2019  Patient ID:  Becky, [name] 11/06/1950, MRN [id number] PCP:  Hulan Fess, MD  Electrophysiologist:  Dr. Curt Bears   Chief Complaint: annual visit  History of Present Illness: Becky [name] is a 69 y.o. female with history of HTN, HLD, CKD, stroke, TIA, hypothyroidism, prolonged QT.   She comes in today to be seen for Dr. Curt Bears.  Last seen by him 09/27/2018.  She was doing well, discussed some degree of SOB at rest and exertion.  She was taking nadolol, though currently had run out. Her medicine refilled, instructed to avoid QT prolonging medicines. Planned for stress test to evaluate her SOB, though low suspicion for anginal equivalent.  Stress was low risk.   TODAY She is doing well She has a small dog that she walks 5x per day, takes about 10-14minutes stopping for the dog, she is unsure the length of the walk, but denies any exertional intolerances with her walks or her ADLs Denies any CP, palpitations or cardiac awareness NO SOB/DOE No dizzy spells, near syncope or syncope  Past Medical History:  Diagnosis Date  . Chronic kidney disease   . CVA (cerebral vascular accident) ([place]) 11/14/2013  . Depression    followed by Dr Toy Care  . Falls frequently 10/01/2015  . Genital herpes   . History of melanoma    followed by derm Dr Delman Cheadle  . Hyperlipidemia   . Hypertension   . MCI (mild cognitive impairment) 10/01/2015  . Pure hypercholesterolemia 11/14/2013  . Stroke [facility])    Old stroke seen on MRI 12/2009, carotid doppler w minimal plaque, MRI off the head otherwise neg for acute disease.  . Thyroid disease    hypothyroid. Followed by Dr Toy Care  . TIA (transient ischemic attack) 8/08   followed by neurologist Dr Leonie Man    Past Surgical History:  Procedure Laterality Date  . ABDOMINAL HYSTERECTOMY    . APPENDECTOMY    . CHOLECYSTECTOMY    . MELANOMA EXCISION     x2    Current Outpatient Medications  Medication  Sig Dispense Refill  . acyclovir (ZOVIRAX) 200 MG capsule Take 200 mg by mouth 5 (five) times daily as needed.     . ALPRAZolam (XANAX) 0.25 MG tablet 3 (three) times daily as needed.   1  . amLODipine (NORVASC) 5 MG tablet Take 5 mg by mouth daily.     Marland Kitchen aspirin EC 325 MG tablet Take 325 mg by mouth 2 (two) times daily.     Marland Kitchen desvenlafaxine (PRISTIQ) 100 MG 24 hr tablet Take 100 mg by mouth daily.    Marland Kitchen Dexmethylphenidate HCl 40 MG CP24 Take 40 mg daily by mouth.    Marland Kitchen L-Methylfolate-Algae (DEPLIN 15 PO) Take 1 tablet daily by mouth.    . levothyroxine (SYNTHROID, LEVOTHROID) 25 MCG tablet Take 25 mcg by mouth daily before breakfast.    . losartan-hydrochlorothiazide (HYZAAR) 100-25 MG per tablet Take 1 tablet by mouth daily.    . nadolol (CORGARD) 20 MG tablet Take 1 tablet (20 mg total) by mouth daily. Please keep upcoming appt in August before anymore refills. Thank you 30 tablet 0  . potassium chloride SA (K-DUR,KLOR-CON) 20 MEQ tablet Take 10 mEq by mouth daily.    . pramipexole (MIRAPEX) 0.125 MG tablet   11  . ranitidine (ZANTAC) 150 MG tablet Take 150 mg by mouth 2 (two) times daily.    . rosuvastatin (CRESTOR) 10 MG tablet Take 10 mg by  mouth daily.     . traZODone (DESYREL) 50 MG tablet Take 50 mg by mouth at bedtime as needed for sleep.     No current facility-administered medications for this visit.    Allergies:   Penicillins, Iodinated diagnostic agents, Iohexol, and Nsaids   Social History:  The patient  reports that she has never smoked. She has never used smokeless tobacco. She reports that she does not drink alcohol and does not use drugs.   Family History:  The patient's family history includes Breast cancer in her mother; Dementia in her maternal aunt; Heart attack in her father; Hyperlipidemia in her father and sister; Hypertension in her father and sister.  ROS:  Please see the history of present illness. All other systems are reviewed and otherwise negative.    PHYSICAL EXAM:  VS:  BP 124/82   Pulse 78   Ht 5' (1.524 m)   Wt 167 lb (75.8 kg)   BMI 32.61 kg/m  BMI: Body mass index is 32.61 kg/m. Well nourished, well developed, in no acute distress  HEENT: normocephalic, atraumatic  Neck: no JVD, carotid bruits or masses Cardiac:  RRR; no significant murmurs, no rubs, or gallops Lungs:  CTA b/l, no wheezing, rhonchi or rales  Abd: soft, nontender MS: no deformity or atrophy Ext: no edema  Skin: warm and dry, no rash Neuro:  No gross deficits appreciated Psych: euthymic mood, full affect   EKG:  Done today and reviewed by myself shows  SR 78bpm, manually measured QT 360-3107ms, QTc 410-484ms   10/06/2018: stress myoview  The left ventricular ejection fraction is hyperdynamic (>65%).  Nuclear stress EF: 67%.  There was no ST segment deviation noted during stress.  The study is normal.  This is a low risk study.  No prior study for comparison.   12/18/2009: TTE Study Conclusions  - Left ventricle: The cavity size was normal. Systolic function was   normal. The estimated ejection fraction was in the range of 60% to   65%. Although no diagnostic regional wall motion abnormality was   identified, this possibility cannot be completely excluded on the   basis of this study. Doppler parameters are consistent with   abnormal left ventricular relaxation (grade 1 diastolic   dysfunction).  - Right ventricle: The cavity size was normal. Wall thickness was at   the upper limits of normal.  - Pericardium, extracardiac: A trivial pericardial effusion was   identified.  Impressions:   No cardiac source of emboli was indentified.  Recommendations: Consider transesophageal echocardiography if  clinically indicated   Recent Labs: No results found for requested labs within last 8760 hours.  No results found for requested labs within last 8760 hours.   CrCl cannot be calculated (Patient's most recent lab result  is older than the maximum 21 days allowed.).   Wt Readings from Last 3 Encounters:  10/18/19 167 lb (75.8 kg)  06/29/19 166 lb (75.3 kg)  10/06/18 185 lb (83.9 kg)     Other studies reviewed: Additional studies/records reviewed today include: summarized above  ASSESSMENT AND PLAN:  1. Prolonged QT     Reports compliance with her nadolol     QT looks OK  Reviewed her med list, discussed her trazodone, she uses infrequently for sleep, recommend that she not use this and ask her PMD for an alternative without QT prolongation proprties  2. HTN     Looks OK, no changes  3. HLD     Reports her PMD  does labs 2x year  She is on ASA 325mg  BID, she reports since her stroke, was prescribed to take this dose.  She no longer follows with neurology, having been released from his service.  She is recommended to d/w her PMD if this remains the recommended dose for her.    Disposition: F/u with [place] in 1 year, sooner if needed  Current medicines are reviewed at length with the patient today.  The patient did not have any concerns regarding medicines.  Venetia Night, PA-C 10/18/2019 2:57 PM     [place] [address] [place] [address] [phone] (office)  [phone] (fax)

## 2019-10-18 ENCOUNTER — Other Ambulatory Visit: Payer: Self-pay

## 2019-10-18 ENCOUNTER — Ambulatory Visit: Payer: Medicare HMO | Admitting: Physician Assistant

## 2019-10-18 VITALS — BP 124/82 | HR 78 | Ht 60.0 in | Wt 167.0 lb

## 2019-10-18 DIAGNOSIS — E785 Hyperlipidemia, unspecified: Secondary | ICD-10-CM

## 2019-10-18 DIAGNOSIS — I10 Essential (primary) hypertension: Secondary | ICD-10-CM | POA: Diagnosis not present

## 2019-10-18 DIAGNOSIS — R9431 Abnormal electrocardiogram [ECG] [EKG]: Secondary | ICD-10-CM | POA: Diagnosis not present

## 2019-10-18 NOTE — Patient Instructions (Signed)
Medication Instructions:   Your physician recommends that you continue on your current medications as directed. Please refer to the Current Medication list given to you today.  *If you need a refill on your cardiac medications before your next appointment, please call your pharmacy*   Lab Work: NONE ORDERED  TODAY   If you have labs (blood work) drawn today and your tests are completely normal, you will receive your results only by: . MyChart Message (if you have MyChart) OR . A paper copy in the mail If you have any lab test that is abnormal or we need to change your treatment, we will call you to review the results.   Testing/Procedures: NONE ORDERED  TODAY    Follow-Up: At [facility], you and your health needs are our priority.  As part of our continuing mission to provide you with exceptional heart care, we have created designated Provider Care Teams.  These Care Teams include your primary Cardiologist (physician) and Advanced Practice Providers (APPs -  Physician Assistants and Nurse Practitioners) who all work together to provide you with the care you need, when you need it.  We recommend signing up for the patient portal called "MyChart".  Sign up information is provided on this After Visit Summary.  MyChart is used to connect with patients for Virtual Visits (Telemedicine).  Patients are able to view lab/test results, encounter notes, upcoming appointments, etc.  Non-urgent messages can be sent to your provider as well.   To learn more about what you can do with MyChart, go to [email or web address].    Your next appointment:   1 year(s)  The format for your next appointment:   In Person  Provider:   You may see Dr. Camnitz  or one of the following Advanced Practice Providers on your designated Care Team:    Amber Seiler, NP  Renee Ursuy, PA-C  Michael "Andy" Tillery, PA-C    Other Instructions   

## 2019-10-24 DIAGNOSIS — R7401 Elevation of levels of liver transaminase levels: Secondary | ICD-10-CM | POA: Diagnosis not present

## 2019-10-26 DIAGNOSIS — K219 Gastro-esophageal reflux disease without esophagitis: Secondary | ICD-10-CM | POA: Diagnosis not present

## 2019-10-26 DIAGNOSIS — K760 Fatty (change of) liver, not elsewhere classified: Secondary | ICD-10-CM | POA: Diagnosis not present

## 2019-10-28 ENCOUNTER — Other Ambulatory Visit: Payer: Self-pay | Admitting: Cardiology

## 2019-10-28 DIAGNOSIS — I10 Essential (primary) hypertension: Secondary | ICD-10-CM | POA: Diagnosis not present

## 2019-10-28 DIAGNOSIS — R69 Illness, unspecified: Secondary | ICD-10-CM | POA: Diagnosis not present

## 2019-10-28 DIAGNOSIS — I639 Cerebral infarction, unspecified: Secondary | ICD-10-CM | POA: Diagnosis not present

## 2019-10-28 DIAGNOSIS — M8580 Other specified disorders of bone density and structure, unspecified site: Secondary | ICD-10-CM | POA: Diagnosis not present

## 2019-10-28 DIAGNOSIS — E7800 Pure hypercholesterolemia, unspecified: Secondary | ICD-10-CM | POA: Diagnosis not present

## 2019-10-28 DIAGNOSIS — N189 Chronic kidney disease, unspecified: Secondary | ICD-10-CM | POA: Diagnosis not present

## 2019-10-28 DIAGNOSIS — E039 Hypothyroidism, unspecified: Secondary | ICD-10-CM | POA: Diagnosis not present

## 2019-11-01 DIAGNOSIS — D485 Neoplasm of uncertain behavior of skin: Secondary | ICD-10-CM | POA: Diagnosis not present

## 2019-11-01 DIAGNOSIS — D225 Melanocytic nevi of trunk: Secondary | ICD-10-CM | POA: Diagnosis not present

## 2019-11-16 DIAGNOSIS — L239 Allergic contact dermatitis, unspecified cause: Secondary | ICD-10-CM | POA: Diagnosis not present

## 2019-11-28 DIAGNOSIS — I639 Cerebral infarction, unspecified: Secondary | ICD-10-CM | POA: Diagnosis not present

## 2019-11-28 DIAGNOSIS — R69 Illness, unspecified: Secondary | ICD-10-CM | POA: Diagnosis not present

## 2019-11-28 DIAGNOSIS — E7800 Pure hypercholesterolemia, unspecified: Secondary | ICD-10-CM | POA: Diagnosis not present

## 2019-11-28 DIAGNOSIS — E039 Hypothyroidism, unspecified: Secondary | ICD-10-CM | POA: Diagnosis not present

## 2019-11-28 DIAGNOSIS — N189 Chronic kidney disease, unspecified: Secondary | ICD-10-CM | POA: Diagnosis not present

## 2019-11-28 DIAGNOSIS — M8580 Other specified disorders of bone density and structure, unspecified site: Secondary | ICD-10-CM | POA: Diagnosis not present

## 2019-11-28 DIAGNOSIS — I10 Essential (primary) hypertension: Secondary | ICD-10-CM | POA: Diagnosis not present

## 2019-12-29 DIAGNOSIS — R69 Illness, unspecified: Secondary | ICD-10-CM | POA: Diagnosis not present

## 2019-12-29 DIAGNOSIS — Z0000 Encounter for general adult medical examination without abnormal findings: Secondary | ICD-10-CM | POA: Diagnosis not present

## 2019-12-29 DIAGNOSIS — I10 Essential (primary) hypertension: Secondary | ICD-10-CM | POA: Diagnosis not present

## 2019-12-29 DIAGNOSIS — E039 Hypothyroidism, unspecified: Secondary | ICD-10-CM | POA: Diagnosis not present

## 2019-12-29 DIAGNOSIS — Z8673 Personal history of transient ischemic attack (TIA), and cerebral infarction without residual deficits: Secondary | ICD-10-CM | POA: Diagnosis not present

## 2019-12-29 DIAGNOSIS — G2581 Restless legs syndrome: Secondary | ICD-10-CM | POA: Diagnosis not present

## 2019-12-29 DIAGNOSIS — E7800 Pure hypercholesterolemia, unspecified: Secondary | ICD-10-CM | POA: Diagnosis not present

## 2019-12-29 DIAGNOSIS — M8580 Other specified disorders of bone density and structure, unspecified site: Secondary | ICD-10-CM | POA: Diagnosis not present

## 2019-12-29 DIAGNOSIS — Z86010 Personal history of colonic polyps: Secondary | ICD-10-CM | POA: Diagnosis not present

## 2019-12-29 DIAGNOSIS — Z85820 Personal history of malignant melanoma of skin: Secondary | ICD-10-CM | POA: Diagnosis not present

## 2019-12-30 DIAGNOSIS — I639 Cerebral infarction, unspecified: Secondary | ICD-10-CM | POA: Diagnosis not present

## 2019-12-30 DIAGNOSIS — H26492 Other secondary cataract, left eye: Secondary | ICD-10-CM | POA: Diagnosis not present

## 2019-12-30 DIAGNOSIS — E039 Hypothyroidism, unspecified: Secondary | ICD-10-CM | POA: Diagnosis not present

## 2019-12-30 DIAGNOSIS — N189 Chronic kidney disease, unspecified: Secondary | ICD-10-CM | POA: Diagnosis not present

## 2019-12-30 DIAGNOSIS — Z961 Presence of intraocular lens: Secondary | ICD-10-CM | POA: Diagnosis not present

## 2019-12-30 DIAGNOSIS — I10 Essential (primary) hypertension: Secondary | ICD-10-CM | POA: Diagnosis not present

## 2019-12-30 DIAGNOSIS — H2511 Age-related nuclear cataract, right eye: Secondary | ICD-10-CM | POA: Diagnosis not present

## 2019-12-30 DIAGNOSIS — R69 Illness, unspecified: Secondary | ICD-10-CM | POA: Diagnosis not present

## 2019-12-30 DIAGNOSIS — E785 Hyperlipidemia, unspecified: Secondary | ICD-10-CM | POA: Diagnosis not present

## 2019-12-30 DIAGNOSIS — M8580 Other specified disorders of bone density and structure, unspecified site: Secondary | ICD-10-CM | POA: Diagnosis not present

## 2019-12-30 DIAGNOSIS — E7800 Pure hypercholesterolemia, unspecified: Secondary | ICD-10-CM | POA: Diagnosis not present

## 2020-01-10 DIAGNOSIS — H26492 Other secondary cataract, left eye: Secondary | ICD-10-CM | POA: Diagnosis not present

## 2020-01-20 DIAGNOSIS — R69 Illness, unspecified: Secondary | ICD-10-CM | POA: Diagnosis not present

## 2020-01-20 DIAGNOSIS — M8580 Other specified disorders of bone density and structure, unspecified site: Secondary | ICD-10-CM | POA: Diagnosis not present

## 2020-01-20 DIAGNOSIS — E785 Hyperlipidemia, unspecified: Secondary | ICD-10-CM | POA: Diagnosis not present

## 2020-01-20 DIAGNOSIS — I639 Cerebral infarction, unspecified: Secondary | ICD-10-CM | POA: Diagnosis not present

## 2020-01-20 DIAGNOSIS — I10 Essential (primary) hypertension: Secondary | ICD-10-CM | POA: Diagnosis not present

## 2020-01-20 DIAGNOSIS — E7800 Pure hypercholesterolemia, unspecified: Secondary | ICD-10-CM | POA: Diagnosis not present

## 2020-01-20 DIAGNOSIS — N189 Chronic kidney disease, unspecified: Secondary | ICD-10-CM | POA: Diagnosis not present

## 2020-01-20 DIAGNOSIS — K219 Gastro-esophageal reflux disease without esophagitis: Secondary | ICD-10-CM | POA: Diagnosis not present

## 2020-01-20 DIAGNOSIS — E039 Hypothyroidism, unspecified: Secondary | ICD-10-CM | POA: Diagnosis not present

## 2020-02-28 DIAGNOSIS — E039 Hypothyroidism, unspecified: Secondary | ICD-10-CM | POA: Diagnosis not present

## 2020-02-28 DIAGNOSIS — E7800 Pure hypercholesterolemia, unspecified: Secondary | ICD-10-CM | POA: Diagnosis not present

## 2020-02-28 DIAGNOSIS — I639 Cerebral infarction, unspecified: Secondary | ICD-10-CM | POA: Diagnosis not present

## 2020-02-28 DIAGNOSIS — K219 Gastro-esophageal reflux disease without esophagitis: Secondary | ICD-10-CM | POA: Diagnosis not present

## 2020-02-28 DIAGNOSIS — N189 Chronic kidney disease, unspecified: Secondary | ICD-10-CM | POA: Diagnosis not present

## 2020-02-28 DIAGNOSIS — I10 Essential (primary) hypertension: Secondary | ICD-10-CM | POA: Diagnosis not present

## 2020-02-28 DIAGNOSIS — M8580 Other specified disorders of bone density and structure, unspecified site: Secondary | ICD-10-CM | POA: Diagnosis not present

## 2020-02-28 DIAGNOSIS — E785 Hyperlipidemia, unspecified: Secondary | ICD-10-CM | POA: Diagnosis not present

## 2020-02-28 DIAGNOSIS — R69 Illness, unspecified: Secondary | ICD-10-CM | POA: Diagnosis not present

## 2020-03-22 DIAGNOSIS — I10 Essential (primary) hypertension: Secondary | ICD-10-CM | POA: Diagnosis not present

## 2020-03-22 DIAGNOSIS — I639 Cerebral infarction, unspecified: Secondary | ICD-10-CM | POA: Diagnosis not present

## 2020-03-22 DIAGNOSIS — E039 Hypothyroidism, unspecified: Secondary | ICD-10-CM | POA: Diagnosis not present

## 2020-03-22 DIAGNOSIS — M8580 Other specified disorders of bone density and structure, unspecified site: Secondary | ICD-10-CM | POA: Diagnosis not present

## 2020-03-22 DIAGNOSIS — F332 Major depressive disorder, recurrent severe without psychotic features: Secondary | ICD-10-CM | POA: Diagnosis not present

## 2020-03-22 DIAGNOSIS — E7800 Pure hypercholesterolemia, unspecified: Secondary | ICD-10-CM | POA: Diagnosis not present

## 2020-03-22 DIAGNOSIS — K219 Gastro-esophageal reflux disease without esophagitis: Secondary | ICD-10-CM | POA: Diagnosis not present

## 2020-03-22 DIAGNOSIS — E785 Hyperlipidemia, unspecified: Secondary | ICD-10-CM | POA: Diagnosis not present

## 2020-03-22 DIAGNOSIS — F329 Major depressive disorder, single episode, unspecified: Secondary | ICD-10-CM | POA: Diagnosis not present

## 2020-03-22 DIAGNOSIS — N189 Chronic kidney disease, unspecified: Secondary | ICD-10-CM | POA: Diagnosis not present

## 2020-04-19 DIAGNOSIS — K760 Fatty (change of) liver, not elsewhere classified: Secondary | ICD-10-CM | POA: Diagnosis not present

## 2020-04-19 DIAGNOSIS — Z85828 Personal history of other malignant neoplasm of skin: Secondary | ICD-10-CM | POA: Diagnosis not present

## 2020-04-19 DIAGNOSIS — L814 Other melanin hyperpigmentation: Secondary | ICD-10-CM | POA: Diagnosis not present

## 2020-04-19 DIAGNOSIS — R9431 Abnormal electrocardiogram [ECG] [EKG]: Secondary | ICD-10-CM | POA: Diagnosis not present

## 2020-04-19 DIAGNOSIS — I129 Hypertensive chronic kidney disease with stage 1 through stage 4 chronic kidney disease, or unspecified chronic kidney disease: Secondary | ICD-10-CM | POA: Diagnosis not present

## 2020-04-19 DIAGNOSIS — Z87442 Personal history of urinary calculi: Secondary | ICD-10-CM | POA: Diagnosis not present

## 2020-04-19 DIAGNOSIS — N1831 Chronic kidney disease, stage 3a: Secondary | ICD-10-CM | POA: Diagnosis not present

## 2020-04-19 DIAGNOSIS — Z86018 Personal history of other benign neoplasm: Secondary | ICD-10-CM | POA: Diagnosis not present

## 2020-04-19 DIAGNOSIS — L821 Other seborrheic keratosis: Secondary | ICD-10-CM | POA: Diagnosis not present

## 2020-04-19 DIAGNOSIS — D225 Melanocytic nevi of trunk: Secondary | ICD-10-CM | POA: Diagnosis not present

## 2020-04-19 DIAGNOSIS — Z85820 Personal history of malignant melanoma of skin: Secondary | ICD-10-CM | POA: Diagnosis not present

## 2020-04-19 DIAGNOSIS — L578 Other skin changes due to chronic exposure to nonionizing radiation: Secondary | ICD-10-CM | POA: Diagnosis not present

## 2020-04-30 DIAGNOSIS — F329 Major depressive disorder, single episode, unspecified: Secondary | ICD-10-CM | POA: Diagnosis not present

## 2020-04-30 DIAGNOSIS — E7800 Pure hypercholesterolemia, unspecified: Secondary | ICD-10-CM | POA: Diagnosis not present

## 2020-04-30 DIAGNOSIS — E039 Hypothyroidism, unspecified: Secondary | ICD-10-CM | POA: Diagnosis not present

## 2020-04-30 DIAGNOSIS — I639 Cerebral infarction, unspecified: Secondary | ICD-10-CM | POA: Diagnosis not present

## 2020-04-30 DIAGNOSIS — M8580 Other specified disorders of bone density and structure, unspecified site: Secondary | ICD-10-CM | POA: Diagnosis not present

## 2020-04-30 DIAGNOSIS — I10 Essential (primary) hypertension: Secondary | ICD-10-CM | POA: Diagnosis not present

## 2020-04-30 DIAGNOSIS — F332 Major depressive disorder, recurrent severe without psychotic features: Secondary | ICD-10-CM | POA: Diagnosis not present

## 2020-04-30 DIAGNOSIS — E785 Hyperlipidemia, unspecified: Secondary | ICD-10-CM | POA: Diagnosis not present

## 2020-04-30 DIAGNOSIS — N189 Chronic kidney disease, unspecified: Secondary | ICD-10-CM | POA: Diagnosis not present

## 2020-04-30 DIAGNOSIS — K219 Gastro-esophageal reflux disease without esophagitis: Secondary | ICD-10-CM | POA: Diagnosis not present

## 2020-05-24 DIAGNOSIS — M8580 Other specified disorders of bone density and structure, unspecified site: Secondary | ICD-10-CM | POA: Diagnosis not present

## 2020-05-24 DIAGNOSIS — N189 Chronic kidney disease, unspecified: Secondary | ICD-10-CM | POA: Diagnosis not present

## 2020-05-24 DIAGNOSIS — E039 Hypothyroidism, unspecified: Secondary | ICD-10-CM | POA: Diagnosis not present

## 2020-05-24 DIAGNOSIS — E785 Hyperlipidemia, unspecified: Secondary | ICD-10-CM | POA: Diagnosis not present

## 2020-05-24 DIAGNOSIS — F332 Major depressive disorder, recurrent severe without psychotic features: Secondary | ICD-10-CM | POA: Diagnosis not present

## 2020-05-24 DIAGNOSIS — I639 Cerebral infarction, unspecified: Secondary | ICD-10-CM | POA: Diagnosis not present

## 2020-05-24 DIAGNOSIS — I10 Essential (primary) hypertension: Secondary | ICD-10-CM | POA: Diagnosis not present

## 2020-05-24 DIAGNOSIS — F329 Major depressive disorder, single episode, unspecified: Secondary | ICD-10-CM | POA: Diagnosis not present

## 2020-05-24 DIAGNOSIS — E7800 Pure hypercholesterolemia, unspecified: Secondary | ICD-10-CM | POA: Diagnosis not present

## 2020-05-24 DIAGNOSIS — K219 Gastro-esophageal reflux disease without esophagitis: Secondary | ICD-10-CM | POA: Diagnosis not present

## 2020-06-07 IMAGING — US US ABDOMEN LIMITED W/ ELASTOGRAPHY
1 series · 12 of 12 positions shown · non-contrast
Comparison: None.

CLINICAL DATA: Elevated liver enzymes.

EXAM:
US ABDOMEN LIMITED - RIGHT UPPER QUADRANT
ULTRASOUND HEPATIC ELASTOGRAPHY
TECHNIQUE: Sonography of the right upper quadrant was performed. In addition,
ultrasound elastography evaluation of the liver was performed. A
region of interest was placed within the right lobe of the liver.
Following application of a compressive sonographic pulse, tissue
compressibility was assessed. Multiple assessments were performed at
the selected site. Median tissue compressibility was determined.
Previously, hepatic stiffness was assessed by shear wave velocity.
Based on recently published Society of Radiologists in Ultrasound
consensus article, reporting is now recommended to be performed in
the SI units of pressure (kiloPascals) representing hepatic
stiffness/elasticity. The obtained result is compared to the
published reference standards. (cACLD= compensated Advanced Chronic
Liver Disease)

[Series 1: us abdomen limited w/ elastography · 0.14mm/px · 12 of 12 slices shown]
[im 1/12]
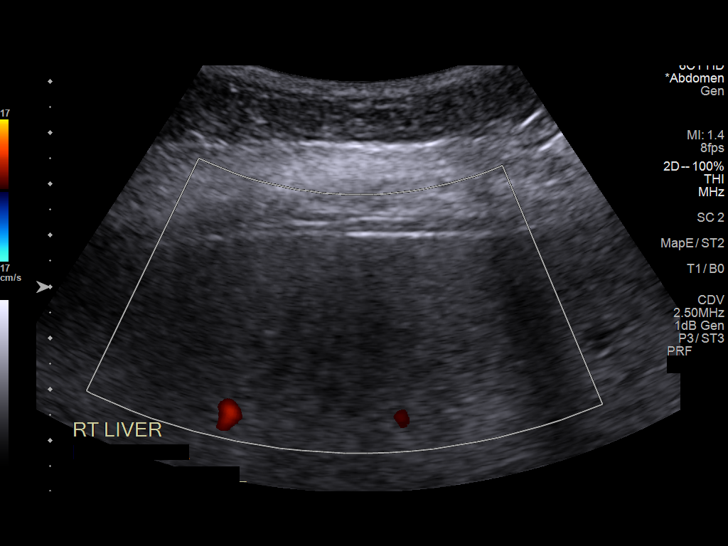
[im 2/12]
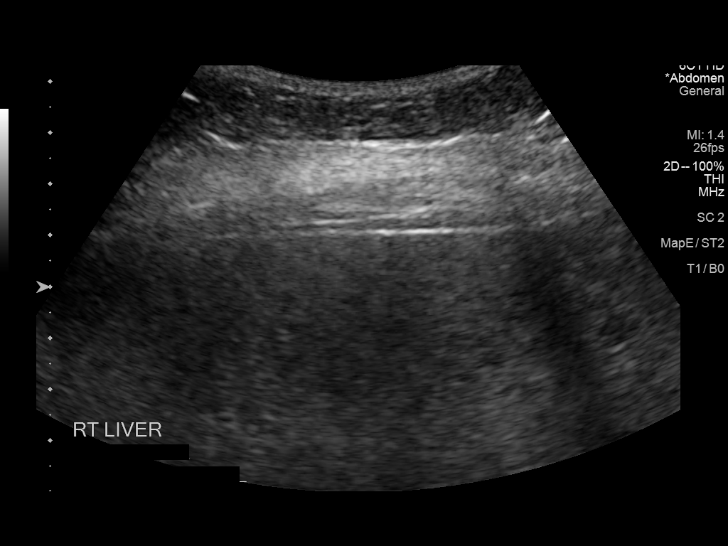
[im 3/12]
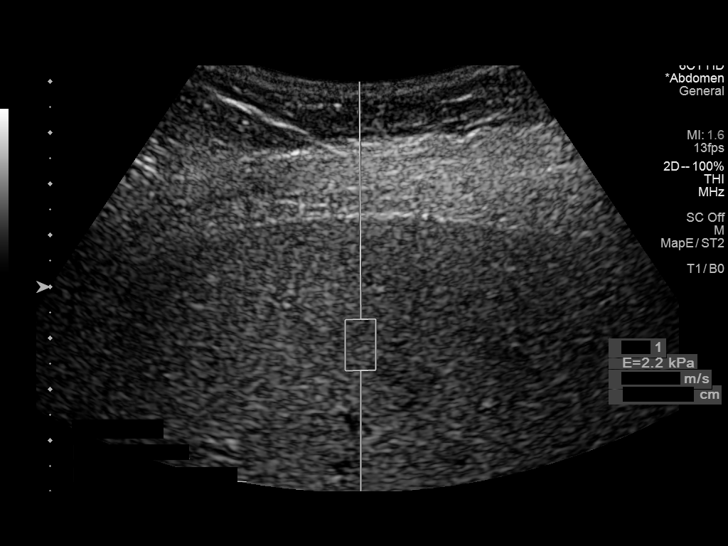
[im 4/12]
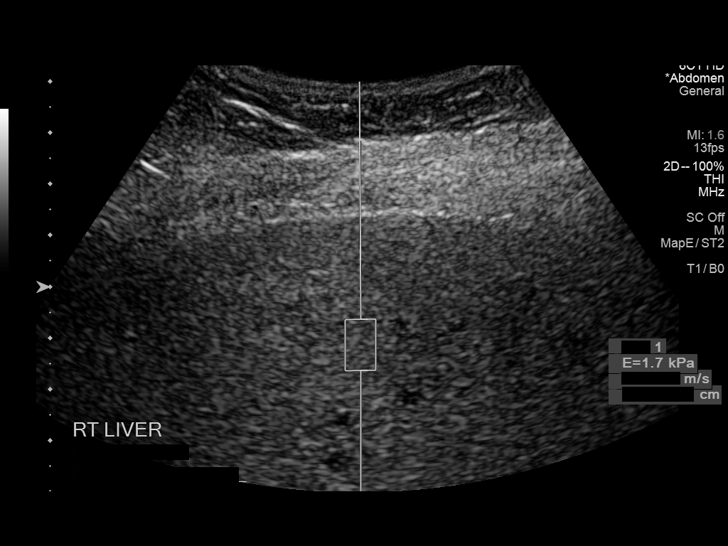
[im 5/12]
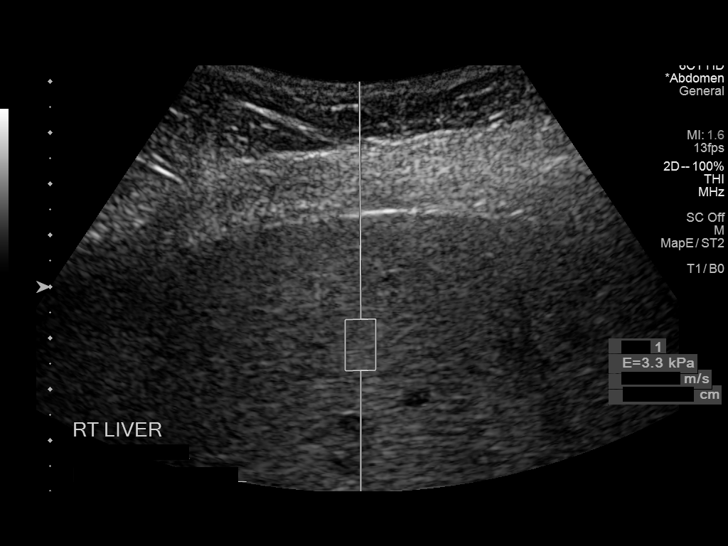
[im 6/12]
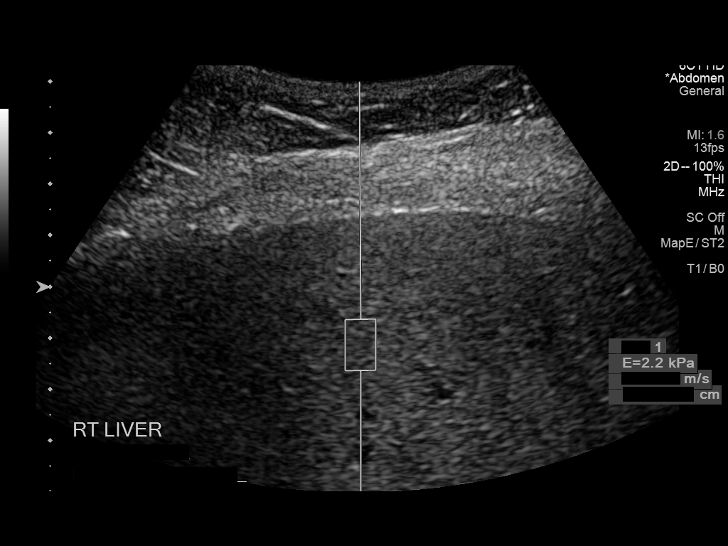
[im 7/12]
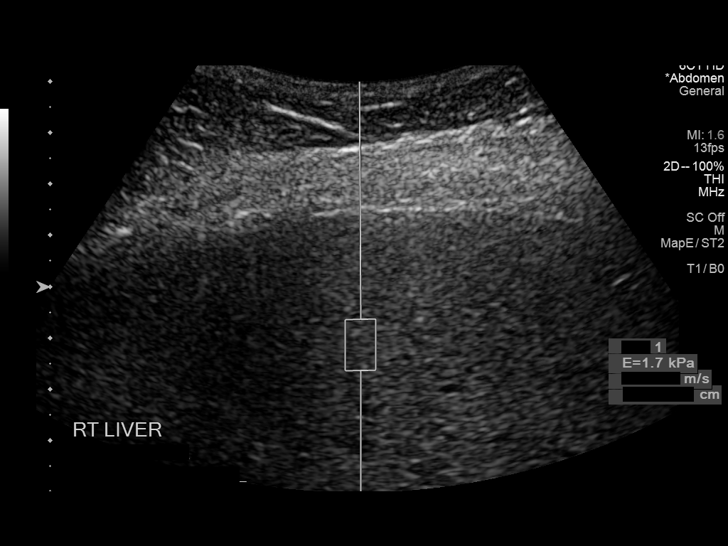
[im 8/12]
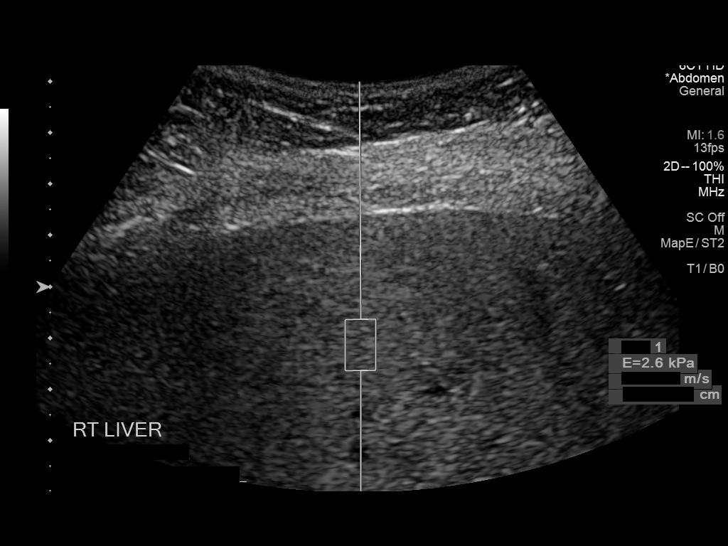
[im 9/12]
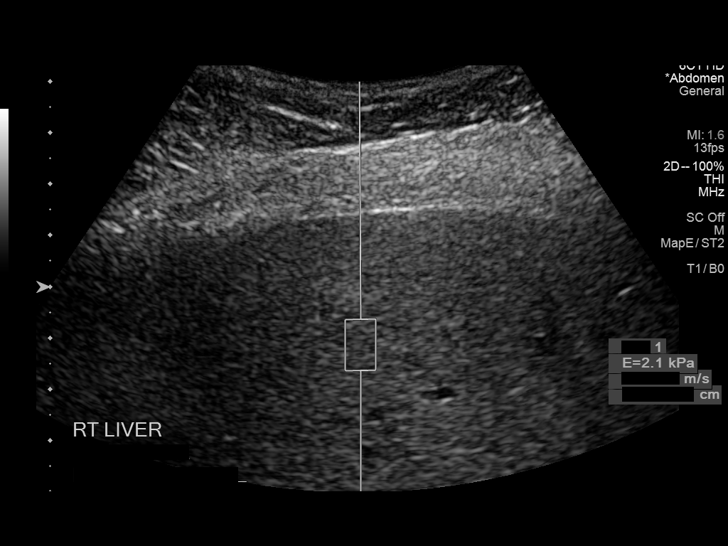
[im 10/12]
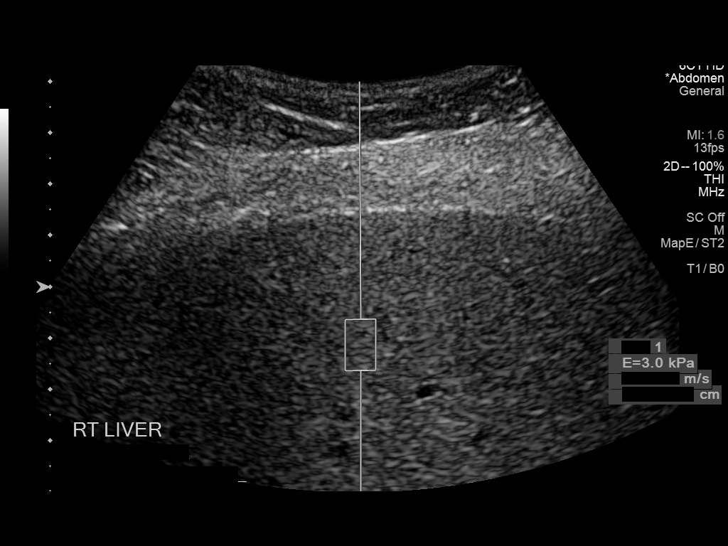
[im 11/12]
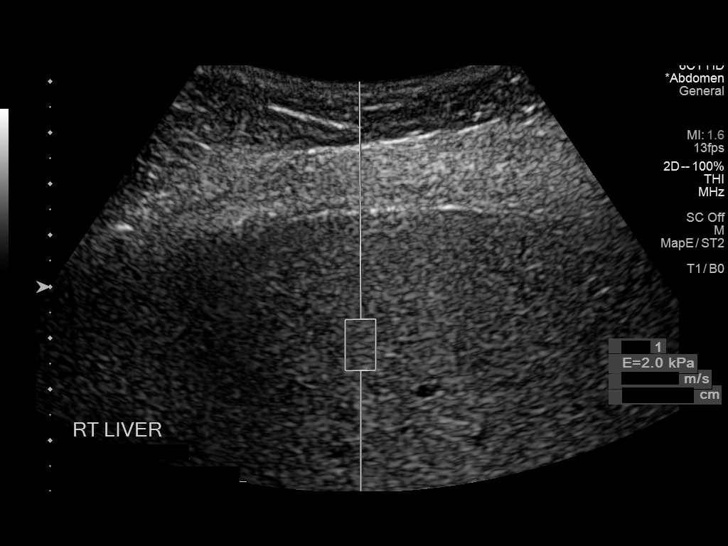
[im 12/12]
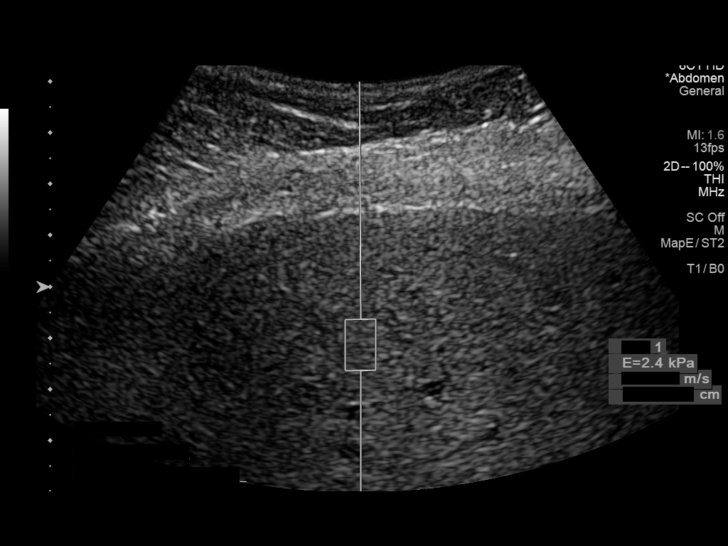

[12 of 12 positions shown; findings below may reference images not displayed]

FINDINGS: ULTRASOUND ABDOMEN LIMITED RIGHT UPPER QUADRANT

Gallbladder:

Surgically absent.

Common bile duct:

Diameter: 7 mm, within normal limits following cholecystectomy.

Liver:

Diffusely increased echogenicity of the hepatic parenchyma,
consistent with hepatic steatosis. No hepatic mass identified.
Portal vein is patent on color Doppler imaging with normal direction
of blood flow towards the liver.

ULTRASOUND HEPATIC ELASTOGRAPHY

Device: Siemens Helix VTQ

Patient position: Oblique

Transducer 6C1

Number of measurements: 10

Hepatic segment:  8

Median kPa:

IQR:

IQR/Median kPa ratio:

Data quality:  Good

Diagnostic category:  ?5 kPa: high probability of being normal
IMPRESSION: ULTRASOUND RUQ:

Diffuse hepatic steatosis.  No liver mass identified.

Prior cholecystectomy.  No evidence of biliary ductal dilatation.

ULTRASOUND HEPATIC ELASTOGRAPHY:

Median kPa:

Diagnostic category:  ?5 kPa: high probability of being normal

The use of hepatic elastography is applicable to patients with viral
hepatitis and non-alcoholic fatty liver disease. At this time, there
is insufficient data for the referenced cut-off values and use in
other causes of liver disease, including alcoholic liver disease.
Patients, however, may be assessed by elastography and serve as
their own reference standard/baseline.

In patients with non-alcoholic liver disease, the values suggesting
compensated advanced chronic liver disease (cACLD) may be lower, and
patients may need additional testing with elasticity results of [DATE]
kPa.

Please note that abnormal hepatic elasticity and shear wave
velocities may also be identified in clinical settings other than
with hepatic fibrosis, such as: acute hepatitis, elevated right
heart and central venous pressures including use of beta blockers,
Hacixelil disease (Khammassi), infiltrative processes such as
mastocytosis/amyloidosis/infiltrative tumor/lymphoma, extrahepatic
cholestasis, with hyperemia in the post-prandial state, and with
liver transplantation. Correlation with patient history, laboratory
data, and clinical condition recommended.

## 2020-06-25 DIAGNOSIS — E7800 Pure hypercholesterolemia, unspecified: Secondary | ICD-10-CM | POA: Diagnosis not present

## 2020-06-25 DIAGNOSIS — E039 Hypothyroidism, unspecified: Secondary | ICD-10-CM | POA: Diagnosis not present

## 2020-06-25 DIAGNOSIS — E785 Hyperlipidemia, unspecified: Secondary | ICD-10-CM | POA: Diagnosis not present

## 2020-06-25 DIAGNOSIS — K219 Gastro-esophageal reflux disease without esophagitis: Secondary | ICD-10-CM | POA: Diagnosis not present

## 2020-06-25 DIAGNOSIS — N189 Chronic kidney disease, unspecified: Secondary | ICD-10-CM | POA: Diagnosis not present

## 2020-06-25 DIAGNOSIS — F329 Major depressive disorder, single episode, unspecified: Secondary | ICD-10-CM | POA: Diagnosis not present

## 2020-06-25 DIAGNOSIS — I10 Essential (primary) hypertension: Secondary | ICD-10-CM | POA: Diagnosis not present

## 2020-06-25 DIAGNOSIS — M8580 Other specified disorders of bone density and structure, unspecified site: Secondary | ICD-10-CM | POA: Diagnosis not present

## 2020-08-21 DIAGNOSIS — R5383 Other fatigue: Secondary | ICD-10-CM | POA: Diagnosis not present

## 2020-08-21 DIAGNOSIS — R300 Dysuria: Secondary | ICD-10-CM | POA: Diagnosis not present

## 2020-08-21 DIAGNOSIS — L298 Other pruritus: Secondary | ICD-10-CM | POA: Diagnosis not present

## 2020-08-28 DIAGNOSIS — E039 Hypothyroidism, unspecified: Secondary | ICD-10-CM | POA: Diagnosis not present

## 2020-08-28 DIAGNOSIS — K219 Gastro-esophageal reflux disease without esophagitis: Secondary | ICD-10-CM | POA: Diagnosis not present

## 2020-08-28 DIAGNOSIS — I639 Cerebral infarction, unspecified: Secondary | ICD-10-CM | POA: Diagnosis not present

## 2020-08-28 DIAGNOSIS — N189 Chronic kidney disease, unspecified: Secondary | ICD-10-CM | POA: Diagnosis not present

## 2020-08-28 DIAGNOSIS — F329 Major depressive disorder, single episode, unspecified: Secondary | ICD-10-CM | POA: Diagnosis not present

## 2020-08-28 DIAGNOSIS — E785 Hyperlipidemia, unspecified: Secondary | ICD-10-CM | POA: Diagnosis not present

## 2020-08-28 DIAGNOSIS — I10 Essential (primary) hypertension: Secondary | ICD-10-CM | POA: Diagnosis not present

## 2020-08-28 DIAGNOSIS — M8580 Other specified disorders of bone density and structure, unspecified site: Secondary | ICD-10-CM | POA: Diagnosis not present

## 2020-08-28 DIAGNOSIS — E7800 Pure hypercholesterolemia, unspecified: Secondary | ICD-10-CM | POA: Diagnosis not present

## 2020-09-20 DIAGNOSIS — I639 Cerebral infarction, unspecified: Secondary | ICD-10-CM | POA: Diagnosis not present

## 2020-09-20 DIAGNOSIS — F329 Major depressive disorder, single episode, unspecified: Secondary | ICD-10-CM | POA: Diagnosis not present

## 2020-09-20 DIAGNOSIS — E785 Hyperlipidemia, unspecified: Secondary | ICD-10-CM | POA: Diagnosis not present

## 2020-09-20 DIAGNOSIS — E039 Hypothyroidism, unspecified: Secondary | ICD-10-CM | POA: Diagnosis not present

## 2020-09-20 DIAGNOSIS — N189 Chronic kidney disease, unspecified: Secondary | ICD-10-CM | POA: Diagnosis not present

## 2020-09-20 DIAGNOSIS — E7800 Pure hypercholesterolemia, unspecified: Secondary | ICD-10-CM | POA: Diagnosis not present

## 2020-09-20 DIAGNOSIS — I10 Essential (primary) hypertension: Secondary | ICD-10-CM | POA: Diagnosis not present

## 2020-09-20 DIAGNOSIS — K219 Gastro-esophageal reflux disease without esophagitis: Secondary | ICD-10-CM | POA: Diagnosis not present

## 2020-09-20 DIAGNOSIS — M8580 Other specified disorders of bone density and structure, unspecified site: Secondary | ICD-10-CM | POA: Diagnosis not present

## 2020-09-22 DIAGNOSIS — U071 COVID-19: Secondary | ICD-10-CM | POA: Diagnosis not present

## 2020-09-22 DIAGNOSIS — R051 Acute cough: Secondary | ICD-10-CM | POA: Diagnosis not present

## 2020-09-22 DIAGNOSIS — R519 Headache, unspecified: Secondary | ICD-10-CM | POA: Diagnosis not present

## 2020-09-22 DIAGNOSIS — J069 Acute upper respiratory infection, unspecified: Secondary | ICD-10-CM | POA: Diagnosis not present

## 2020-09-24 ENCOUNTER — Emergency Department (HOSPITAL_COMMUNITY): Payer: PPO

## 2020-09-24 ENCOUNTER — Other Ambulatory Visit: Payer: Self-pay

## 2020-09-24 ENCOUNTER — Encounter (HOSPITAL_COMMUNITY): Payer: Self-pay

## 2020-09-24 ENCOUNTER — Emergency Department (HOSPITAL_COMMUNITY)
Admission: EM | Admit: 2020-09-24 | Discharge: 2020-09-24 | Disposition: A | Discharge status: Home / Self Care | Payer: PPO | Attending: Emergency Medicine | Admitting: Emergency Medicine

## 2020-09-24 DIAGNOSIS — Z7982 Long term (current) use of aspirin: Secondary | ICD-10-CM | POA: Insufficient documentation

## 2020-09-24 DIAGNOSIS — N189 Chronic kidney disease, unspecified: Secondary | ICD-10-CM | POA: Insufficient documentation

## 2020-09-24 DIAGNOSIS — Z85820 Personal history of malignant melanoma of skin: Secondary | ICD-10-CM | POA: Diagnosis not present

## 2020-09-24 DIAGNOSIS — E876 Hypokalemia: Secondary | ICD-10-CM

## 2020-09-24 DIAGNOSIS — R0602 Shortness of breath: Secondary | ICD-10-CM | POA: Diagnosis not present

## 2020-09-24 DIAGNOSIS — Z79899 Other long term (current) drug therapy: Secondary | ICD-10-CM | POA: Diagnosis not present

## 2020-09-24 DIAGNOSIS — U071 COVID-19: Secondary | ICD-10-CM | POA: Diagnosis not present

## 2020-09-24 DIAGNOSIS — I129 Hypertensive chronic kidney disease with stage 1 through stage 4 chronic kidney disease, or unspecified chronic kidney disease: Secondary | ICD-10-CM | POA: Diagnosis not present

## 2020-09-24 DIAGNOSIS — E871 Hypo-osmolality and hyponatremia: Secondary | ICD-10-CM | POA: Diagnosis not present

## 2020-09-24 DIAGNOSIS — J9811 Atelectasis: Secondary | ICD-10-CM | POA: Diagnosis not present

## 2020-09-24 LAB — BASIC METABOLIC PANEL
Anion gap: 10 (ref 5–15)
BUN: 12 mg/dL (ref 8–23)
CO2: 28 mmol/L (ref 22–32)
Calcium: 9.3 mg/dL (ref 8.9–10.3)
Chloride: 95 mmol/L — ABNORMAL LOW (ref 98–111)
Creatinine, Ser: 0.94 mg/dL (ref 0.44–1.00)
GFR, Estimated: >60 mL/min (ref >60)
Glucose, Bld: 97 mg/dL (ref 70–99)
Potassium: 3 mmol/L — ABNORMAL LOW (ref 3.5–5.1)
Sodium: 133 mmol/L — ABNORMAL LOW (ref 135–145)

## 2020-09-24 LAB — CBC WITH DIFFERENTIAL/PLATELET
Abs Immature Granulocytes: 0.05 10*3/uL (ref 0.00–0.07)
Basophils Absolute: 0 10*3/uL (ref 0.0–0.1)
Basophils Relative: 0 %
Eosinophils Absolute: 0.4 10*3/uL (ref 0.0–0.5)
Eosinophils Relative: 5 %
HCT: 37.7 % (ref 36.0–46.0)
Hemoglobin: 12.9 g/dL (ref 12.0–15.0)
Immature Granulocytes: 1 %
Lymphocytes Relative: 26 %
Lymphs Abs: 2.4 10*3/uL (ref 0.7–4.0)
MCH: 30.4 pg (ref 26.0–34.0)
MCHC: 34.2 g/dL (ref 30.0–36.0)
MCV: 88.9 fL (ref 80.0–100.0)
Monocytes Absolute: 1.4 10*3/uL — ABNORMAL HIGH (ref 0.1–1.0)
Monocytes Relative: 15 %
Neutro Abs: 5 10*3/uL (ref 1.7–7.7)
Neutrophils Relative %: 53 %
Platelets: 305 10*3/uL (ref 150–400)
RBC: 4.24 MIL/uL (ref 3.87–5.11)
RDW: 13.2 % (ref 11.5–15.5)
WBC: 9.4 10*3/uL (ref 4.0–10.5)
nRBC: 0 % (ref 0.0–0.2)

## 2020-09-24 MED ORDER — ONDANSETRON 4 MG PO TBDP
4.0000 mg | ORAL_TABLET | Freq: Once | ORAL | Status: AC
  Administered 2020-09-24: 4 mg via ORAL
  Filled 2020-09-24: qty 1

## 2020-09-24 MED ORDER — ONDANSETRON 4 MG PO TBDP: 4.0000 mg | ORAL_TABLET | Freq: Three times a day (TID) | ORAL | 0 refills | Status: DC | PRN

## 2020-09-24 MED ORDER — POTASSIUM CHLORIDE 20 MEQ PO PACK
40.0000 meq | PACK | Freq: Two times a day (BID) | ORAL | Status: DC
  Administered 2020-09-24: 40 meq via ORAL
  Filled 2020-09-24: qty 2

## 2020-09-24 NOTE — ED Provider Notes (Signed)
[facility] DEPT Provider Note   CSN: [id number] Arrival date & time: 09/24/20  1646     History Chief Complaint  Patient presents with   Covid Positive   Shortness of Breath    Becky [name] is a 70 y.o. female.  The history is provided by the patient and medical records.  Shortness of Breath Becky [name] is a 70 y.o. female who presents to the Emergency Department complaining of sob.  Started feeling poorly a week ago and tested positive Saturday for Covid 19.  Went to the [place] walk in clinic and started Paxlovid on Saturday.  Presents today due to feeling poorly the last three days.  She is experiencing body aches, sore throat, cough.  Has sob, overall improving.  No chest pain.  She wanted to get her oxygen level checked.  Cough is nonproductive.  No N/V.  No leg edema/pain.  No hx/o DVT/PE.      Past Medical History:  Diagnosis Date   Chronic kidney disease    CVA (cerebral vascular accident) ([place]) 11/14/2013   Depression    followed by Dr Waverly Ferrari frequently 10/01/2015   Genital herpes    History of melanoma    followed by derm Dr Delman Cheadle   Hyperlipidemia    Hypertension    MCI (mild cognitive impairment) 10/01/2015   Pure hypercholesterolemia 11/14/2013   Stroke [facility])    Old stroke seen on MRI 12/2009, carotid doppler w minimal plaque, MRI off the head otherwise neg for acute disease.   Thyroid disease    hypothyroid. Followed by Dr Toy Care   TIA (transient ischemic attack) 8/08   followed by neurologist Dr Leonie Man    Patient Active Problem List   Diagnosis Date Noted   Thyroid disease    Stroke [facility])    Hypertension    Hyperlipidemia    History of melanoma    Genital herpes    Depression    Chronic kidney disease    MCI (mild cognitive impairment) 10/01/2015   Falls frequently 10/01/2015   Essential hypertension, benign 11/14/2013   CVA (cerebral vascular accident) ([place]) 11/14/2013   Pure hypercholesterolemia  11/14/2013   TIA (transient ischemic attack) 10/02/2006    Past Surgical History:  Procedure Laterality Date   ABDOMINAL HYSTERECTOMY     APPENDECTOMY     CHOLECYSTECTOMY     MELANOMA EXCISION     x2     OB History   No obstetric history on file.     Family History  Problem Relation Age of Onset   Breast cancer Mother    Hyperlipidemia Father    Hypertension Father    Heart attack Father    Hypertension Sister    Hyperlipidemia Sister    Dementia Maternal Aunt     Social History   Tobacco Use   Smoking status: Never   Smokeless tobacco: Never  Vaping Use   Vaping Use: Never used  Substance Use Topics   Alcohol use: No   Drug use: No    Home Medications Prior to Admission medications   Medication Sig Start Date End Date Taking? Authorizing Provider  acyclovir (ZOVIRAX) 200 MG capsule Take 200 mg by mouth 5 (five) times daily as needed.     [provider]  ALPRAZolam Duanne Moron) 0.25 MG tablet 3 (three) times daily as needed.  08/10/15   [provider]  amLODipine (NORVASC) 5 MG tablet Take 5 mg by mouth daily.  12/24/16  [provider]  aspirin EC 325 MG tablet Take 325 mg by mouth 2 (two) times daily.     [provider]  desvenlafaxine (PRISTIQ) 100 MG 24 hr tablet Take 100 mg by mouth daily. 09/13/19   [provider]  Dexmethylphenidate HCl 40 MG CP24 Take 40 mg daily by mouth. 01/08/17   [provider]  L-Methylfolate-Algae (DEPLIN 15 PO) Take 1 tablet daily by mouth.    [provider]  levothyroxine (SYNTHROID, LEVOTHROID) 25 MCG tablet Take 25 mcg by mouth daily before breakfast.    [provider]  losartan-hydrochlorothiazide (HYZAAR) 100-25 MG per tablet Take 1 tablet by mouth daily.    [provider]  nadolol (CORGARD) 20 MG tablet Take 1 tablet (20 mg total) by mouth daily. Please keep upcoming appt in August before anymore refills. Thank you 10/28/19   Camnitz, Ocie Doyne,  MD  potassium chloride SA (K-DUR,KLOR-CON) 20 MEQ tablet Take 10 mEq by mouth daily.    [provider]  pramipexole (MIRAPEX) 0.125 MG tablet  12/26/15   [provider]  ranitidine (ZANTAC) 150 MG tablet Take 150 mg by mouth 2 (two) times daily.    [provider]  rosuvastatin (CRESTOR) 10 MG tablet Take 10 mg by mouth daily.  12/15/16   [provider]  traZODone (DESYREL) 50 MG tablet Take 50 mg by mouth at bedtime as needed for sleep.    [provider]    Allergies    Penicillins, Iodinated diagnostic agents, Iohexol, and Nsaids  Review of Systems   Review of Systems  Respiratory:  Positive for shortness of breath.   All other systems reviewed and are negative.  Physical Exam Updated Vital Signs BP (!) 183/99 (BP Location: Right Arm)   Pulse 65   Temp 98.6 F (37 C) (Oral)   Resp 16   SpO2 96%   Physical Exam Vitals and nursing note reviewed.  Constitutional:      Appearance: She is well-developed.  HENT:     Head: Normocephalic and atraumatic.  Cardiovascular:     Rate and Rhythm: Normal rate and regular rhythm.     Heart sounds: No murmur heard. Pulmonary:     Effort: Pulmonary effort is normal. No respiratory distress.     Breath sounds: Normal breath sounds.  Abdominal:     Palpations: Abdomen is soft.     Tenderness: There is no abdominal tenderness. There is no guarding or rebound.  Musculoskeletal:        General: No swelling or tenderness.  Skin:    General: Skin is warm and dry.  Neurological:     Mental Status: She is alert and oriented to person, place, and time.  Psychiatric:        Behavior: Behavior normal.    ED Results / Procedures / Treatments   Labs (all labs ordered are listed, but only abnormal results are displayed) Labs Reviewed  BASIC METABOLIC PANEL - Abnormal; Notable for the following components:      Result Value   Sodium 133 (*)    Potassium 3.0 (*)    Chloride 95 (*)    All  other components within normal limits  CBC WITH DIFFERENTIAL/PLATELET - Abnormal; Notable for the following components:   Monocytes Absolute 1.4 (*)    All other components within normal limits    EKG EKG Interpretation  Date/Time:  Monday September 24 2020 19:10:21 EDT Ventricular Rate:  64 PR Interval:  126 QRS Duration:  92 QT Interval:  454 QTC Calculation: 468 R Axis:   51 Text Interpretation: Normal sinus rhythm T wave abnormality, consider lateral ischemia Abnormal ECG Confirmed by Quintella Reichert [phone]) on 09/24/2020 7:33:59 PM  Radiology DG Chest Port 1 View  Result Date: 09/24/2020 CLINICAL DATA:  Shortness of breath EXAM: PORTABLE CHEST 1 VIEW COMPARISON:  08/13/2018 FINDINGS: The heart size and mediastinal contours are within normal limits. Minimal left basilar atelectasis. Lungs are otherwise clear. No pleural effusion or pneumothorax. The visualized skeletal structures are unremarkable. IMPRESSION: Minimal left basilar atelectasis. Otherwise no acute cardiopulmonary findings. Electronically Signed   By: Davina Poke D.O.   On: 09/24/2020 18:40    Procedures Procedures   Medications Ordered in ED Medications  potassium chloride (KLOR-CON) packet 40 mEq (has no administration in time range)  ondansetron (ZOFRAN-ODT) disintegrating tablet 4 mg (has no administration in time range)    ED Course  I have reviewed the triage vital signs and the nursing notes.  Pertinent labs & imaging results that were available during my care of the patient were reviewed by me and considered in my medical decision making (see chart for details).    MDM Rules/Calculators/A&P                          Pt with hx/o HTN, CKD, CVA here for evaluation of cough/sob.  She recently tested positive for COVID 19 and has been started on paxlovid.  EKG has TWI in lateral leads, present when compared to priors.  CXR not c/w acute CHF, pna.  BMP with with mild hyponatremia and hypokalemia.  Pt  tolerating orals well.  Doubt acute PE.  Recommend checking troponin due to abnormal EKG and pt declines at this time.  Her sxs have dramatically improved since ED arrival.  Plan to d/c home with outpatient follow up and return precautions.    Final Clinical Impression(s) / ED Diagnoses Final diagnoses:  [id number] virus infection  Hypokalemia    Rx / DC Orders ED Discharge Orders     None        Quintella Reichert, MD 09/24/20 2012

## 2020-09-24 NOTE — ED Notes (Addendum)
Pt ambulated in room unassisted with a steady gait, O2 reading 99% while ambulating.

## 2020-09-24 NOTE — ED Provider Notes (Signed)
Emergency Medicine Provider Triage Evaluation Note  Becky [name] , a 70 y.o. female  was evaluated in triage.  Pt complains of shortness of breath, body aches, after diagnosis of covid 19. Tested positive on 07/23. Has been taking Paxlovid, about two doses without improvement. Worsen SOB along with orthopnea.   Review of Systems  Positive: Shortness of breath, myalgias Negative: Fever, nausea, vomting  Physical Exam  BP 140/86   Pulse 87   Temp 98.5 F (36.9 C) (Oral)   Resp 18   SpO2 99%  Gen:   Awake, ill appearing Resp:  Normal effort no tachypnea MSK:   Moves extremities without difficulty Other:    Medical Decision Making  Medically screening exam initiated at 5:43 PM.  Appropriate orders placed.  Jameka Gornto was informed that the remainder of the evaluation will be completed by another provider, this initial triage assessment does not replace that evaluation, and the importance of remaining in the ED until their evaluation is complete.  Covid +,  O2 99% with ambulation Currently on Paxlovid.   Xray ordered along with labs.   Janeece Fitting, PA-C 09/24/20 1745    Quintella Reichert, MD 09/24/20 [phone]

## 2020-09-24 NOTE — ED Notes (Signed)
Blue top tube sent to lab. 

## 2020-09-24 NOTE — ED Triage Notes (Signed)
Pt c/o SOB since last night. Pt states it worsens when lying down. Pt O2 98% RA while speaking to RN in triage. Pt states she tested COVID+ with a home test 7/23.

## 2020-09-28 DIAGNOSIS — R0602 Shortness of breath: Secondary | ICD-10-CM | POA: Diagnosis not present

## 2020-09-28 DIAGNOSIS — U071 COVID-19: Secondary | ICD-10-CM | POA: Diagnosis not present

## 2020-09-28 DIAGNOSIS — R059 Cough, unspecified: Secondary | ICD-10-CM | POA: Diagnosis not present

## 2020-10-02 DIAGNOSIS — R059 Cough, unspecified: Secondary | ICD-10-CM | POA: Diagnosis not present

## 2020-10-02 DIAGNOSIS — U071 COVID-19: Secondary | ICD-10-CM | POA: Diagnosis not present

## 2020-10-24 DIAGNOSIS — E039 Hypothyroidism, unspecified: Secondary | ICD-10-CM | POA: Diagnosis not present

## 2020-10-24 DIAGNOSIS — E785 Hyperlipidemia, unspecified: Secondary | ICD-10-CM | POA: Diagnosis not present

## 2020-10-24 DIAGNOSIS — M8580 Other specified disorders of bone density and structure, unspecified site: Secondary | ICD-10-CM | POA: Diagnosis not present

## 2020-10-24 DIAGNOSIS — I639 Cerebral infarction, unspecified: Secondary | ICD-10-CM | POA: Diagnosis not present

## 2020-10-24 DIAGNOSIS — E7800 Pure hypercholesterolemia, unspecified: Secondary | ICD-10-CM | POA: Diagnosis not present

## 2020-10-24 DIAGNOSIS — N189 Chronic kidney disease, unspecified: Secondary | ICD-10-CM | POA: Diagnosis not present

## 2020-10-24 DIAGNOSIS — I10 Essential (primary) hypertension: Secondary | ICD-10-CM | POA: Diagnosis not present

## 2020-10-24 DIAGNOSIS — F329 Major depressive disorder, single episode, unspecified: Secondary | ICD-10-CM | POA: Diagnosis not present

## 2020-10-24 DIAGNOSIS — K219 Gastro-esophageal reflux disease without esophagitis: Secondary | ICD-10-CM | POA: Diagnosis not present

## 2020-10-26 ENCOUNTER — Telehealth: Payer: Self-pay | Admitting: Cardiology

## 2020-10-26 NOTE — Telephone Encounter (Signed)
Pt is calling to see if she can switch her medications

## 2020-10-26 NOTE — Telephone Encounter (Signed)
Left a message for the pt to call back.  

## 2020-10-31 NOTE — Telephone Encounter (Signed)
Patient returning call. Patient wanted to switch to a more cost effective medication.The meds are not on her current med list.

## 2020-10-31 NOTE — Telephone Encounter (Signed)
Spoke with the pt but she could not remember the name that Dr. Toy Care was asking her if she could to take for ADHD.Becky [name] to be sure that it does not interact with her Nadolol... she will talk with Dr. Toy Care and call me back this afternoon.

## 2020-10-31 NOTE — Telephone Encounter (Signed)
Pt was put on Strattera (pt unable to read her writing to spell the med and could not pronounce it) 60 mg qg and Concerta 36 mg bid and was told by the prescribing MD.. Dr. Toy Care that there is not a cardiac contraindication and no interaction with nadolol.   Will forward to Dr. Curt Bears and the Pharmacist for review.

## 2020-10-31 NOTE — Telephone Encounter (Signed)
Pt is returning call to [facility] from earlier today.

## 2020-11-01 DIAGNOSIS — R0602 Shortness of breath: Secondary | ICD-10-CM | POA: Diagnosis not present

## 2020-11-01 DIAGNOSIS — Z8616 Personal history of COVID-19: Secondary | ICD-10-CM | POA: Diagnosis not present

## 2020-11-01 NOTE — Telephone Encounter (Signed)
Called patient with Pharmacist, Megan's advisement. Patient verbalized understanding and she stated that she is going to just stick to what she has been taking.

## 2020-11-01 NOTE — Telephone Encounter (Addendum)
Concerta and Strattera both carry increased risk of CV events including MI and CVA, both also increase HR and BP. Pt already has history of TIA. Strattera can also prolong QTc and pt already has hx of prolonged QT. Neither med is ideal given CV risk; risks/benefits of starting therapy would need to be weighed.

## 2020-11-15 ENCOUNTER — Other Ambulatory Visit: Payer: Self-pay | Admitting: Cardiology

## 2020-11-19 DIAGNOSIS — I639 Cerebral infarction, unspecified: Secondary | ICD-10-CM | POA: Diagnosis not present

## 2020-11-19 DIAGNOSIS — N189 Chronic kidney disease, unspecified: Secondary | ICD-10-CM | POA: Diagnosis not present

## 2020-11-19 DIAGNOSIS — M8580 Other specified disorders of bone density and structure, unspecified site: Secondary | ICD-10-CM | POA: Diagnosis not present

## 2020-11-19 DIAGNOSIS — F329 Major depressive disorder, single episode, unspecified: Secondary | ICD-10-CM | POA: Diagnosis not present

## 2020-11-19 DIAGNOSIS — E039 Hypothyroidism, unspecified: Secondary | ICD-10-CM | POA: Diagnosis not present

## 2020-11-19 DIAGNOSIS — E785 Hyperlipidemia, unspecified: Secondary | ICD-10-CM | POA: Diagnosis not present

## 2020-11-19 DIAGNOSIS — I10 Essential (primary) hypertension: Secondary | ICD-10-CM | POA: Diagnosis not present

## 2020-11-19 DIAGNOSIS — F332 Major depressive disorder, recurrent severe without psychotic features: Secondary | ICD-10-CM | POA: Diagnosis not present

## 2020-11-19 DIAGNOSIS — K219 Gastro-esophageal reflux disease without esophagitis: Secondary | ICD-10-CM | POA: Diagnosis not present

## 2020-11-19 DIAGNOSIS — E7800 Pure hypercholesterolemia, unspecified: Secondary | ICD-10-CM | POA: Diagnosis not present

## 2020-12-03 ENCOUNTER — Telehealth: Payer: Self-pay | Admitting: Cardiology

## 2020-12-03 NOTE — Telephone Encounter (Signed)
   Pt c/o medication issue:  1. Name of Medication: focalin  2. How are you currently taking this medication (dosage and times per day)?   3. Are you having a reaction (difficulty breathing--STAT)?   4. What is your medication issue? Dorian Pod with Dr. Starleen Arms office would like to ask if pt can take focalin, Dr. Toy Care would like to prescribed it to the pt.

## 2020-12-03 NOTE — Telephone Encounter (Signed)
Dorian Pod - Dr. Starleen Arms Office [phone]  Asking if they can put the pt on Focalin an ADHD medication.   Will forward to Dr. Curt Bears and to our Pharm D for review. Pt has h/o prolonged QT.   Pt has had several cancellations of appts with Dr. Curt Bears.. LM for her to make an appt asap.

## 2020-12-05 NOTE — Telephone Encounter (Signed)
Pt's had Focalin (dexmethylphenidate) on her med list since 2018, not sure if she was taking this in the past and is now resuming? It's a stimulant (can raise BP or HR) and can enhance the serotonergic effect of her trazodone and Pristiq. Stimulants as a med class have been associated with higher risk of sudden cardiac death, MI or stroke and are generally recommended to avoid in patients with CAD, structural cardiac issues, or rhythm abnormalities. She does have hx of prolonged QT and CVA, but Focalin does not increase risk of QTc prolongation, so is probably a safer stimulant than other options if discussion with prescribing MD favors initiation of stimulant therapy.

## 2020-12-05 NOTE — Telephone Encounter (Signed)
Reviewed message from PharmD with Dr. Starleen Arms office.  Let her know Dr. Curt Bears is in agreement w the recommendation/information provided.  Becky [name] was on the phone but the message will be relayed to her.  I also Epic faxed this encounter to Dr. Starleen Arms attention.

## 2020-12-10 DIAGNOSIS — K219 Gastro-esophageal reflux disease without esophagitis: Secondary | ICD-10-CM | POA: Diagnosis not present

## 2020-12-10 DIAGNOSIS — F332 Major depressive disorder, recurrent severe without psychotic features: Secondary | ICD-10-CM | POA: Diagnosis not present

## 2020-12-10 DIAGNOSIS — E7800 Pure hypercholesterolemia, unspecified: Secondary | ICD-10-CM | POA: Diagnosis not present

## 2020-12-10 DIAGNOSIS — E785 Hyperlipidemia, unspecified: Secondary | ICD-10-CM | POA: Diagnosis not present

## 2020-12-10 DIAGNOSIS — I639 Cerebral infarction, unspecified: Secondary | ICD-10-CM | POA: Diagnosis not present

## 2020-12-10 DIAGNOSIS — N189 Chronic kidney disease, unspecified: Secondary | ICD-10-CM | POA: Diagnosis not present

## 2020-12-10 DIAGNOSIS — E039 Hypothyroidism, unspecified: Secondary | ICD-10-CM | POA: Diagnosis not present

## 2020-12-10 DIAGNOSIS — I10 Essential (primary) hypertension: Secondary | ICD-10-CM | POA: Diagnosis not present

## 2020-12-10 DIAGNOSIS — M8580 Other specified disorders of bone density and structure, unspecified site: Secondary | ICD-10-CM | POA: Diagnosis not present

## 2020-12-10 DIAGNOSIS — F329 Major depressive disorder, single episode, unspecified: Secondary | ICD-10-CM | POA: Diagnosis not present

## 2020-12-31 ENCOUNTER — Other Ambulatory Visit: Payer: Self-pay | Admitting: Cardiology

## 2021-01-14 DIAGNOSIS — E785 Hyperlipidemia, unspecified: Secondary | ICD-10-CM | POA: Diagnosis not present

## 2021-01-14 DIAGNOSIS — E7800 Pure hypercholesterolemia, unspecified: Secondary | ICD-10-CM | POA: Diagnosis not present

## 2021-01-14 DIAGNOSIS — M8580 Other specified disorders of bone density and structure, unspecified site: Secondary | ICD-10-CM | POA: Diagnosis not present

## 2021-01-14 DIAGNOSIS — K219 Gastro-esophageal reflux disease without esophagitis: Secondary | ICD-10-CM | POA: Diagnosis not present

## 2021-01-14 DIAGNOSIS — I10 Essential (primary) hypertension: Secondary | ICD-10-CM | POA: Diagnosis not present

## 2021-01-14 DIAGNOSIS — F329 Major depressive disorder, single episode, unspecified: Secondary | ICD-10-CM | POA: Diagnosis not present

## 2021-01-14 DIAGNOSIS — E039 Hypothyroidism, unspecified: Secondary | ICD-10-CM | POA: Diagnosis not present

## 2021-01-14 DIAGNOSIS — N189 Chronic kidney disease, unspecified: Secondary | ICD-10-CM | POA: Diagnosis not present

## 2021-01-23 DIAGNOSIS — Z1231 Encounter for screening mammogram for malignant neoplasm of breast: Secondary | ICD-10-CM | POA: Diagnosis not present

## 2021-01-28 NOTE — Progress Notes (Signed)
.   Cardiology Office Note Date:  01/28/2021  Patient ID:  Becky, [name] 26-Aug-1950, MRN [id number] PCP:  Hulan Fess, MD  Electrophysiologist:  Dr. Curt Bears   Chief Complaint: annual visit  History of Present Illness: Becky [name] is a 70 y.o. female with history of HTN, HLD, CKD, stroke, TIA, hypothyroidism, prolonged QT.   She comes in today to be seen for Dr. Curt Bears.  Last seen by him 09/27/2018.  She was doing well, discussed some degree of SOB at rest and exertion.  She was taking nadolol, though currently had run out. Her medicine refilled, instructed to avoid QT prolonging medicines. Planned for stress test to evaluate her SOB, though low suspicion for anginal equivalent.  Stress was low risk.   I saw her Aug 2021 She is doing well She has a small dog that she walks 5x per day, takes about 10-79minutes stopping for the dog, she is unsure the length of the walk, but denies any exertional intolerances with her walks or her ADLs Denies any CP, palpitations or cardiac awareness NO SOB/DOE No dizzy spells, near syncope or syncope She had Trazodone that she used infrequently, urged not to with some Qt properties and see her PMD for an alternative  COVID in July  TODAY She reports that her COVID infection was terrible and she felt awful for a long time, remains SOB with minimal exertion. She says she felt GREAT prior to getting sick and has never felt well again She denies CP, palpitations No near syncope or syncope.  She is frustrated over the persistent SOB since COVID   Past Medical History:  Diagnosis Date   Chronic kidney disease    CVA (cerebral vascular accident) ([place]) 11/14/2013   Depression    followed by Dr Waverly Ferrari frequently 10/01/2015   Genital herpes    History of melanoma    followed by derm Dr Delman Cheadle   Hyperlipidemia    Hypertension    MCI (mild cognitive impairment) 10/01/2015   Pure hypercholesterolemia 11/14/2013   Stroke  [facility])    Old stroke seen on MRI 12/2009, carotid doppler w minimal plaque, MRI off the head otherwise neg for acute disease.   Thyroid disease    hypothyroid. Followed by Dr Toy Care   TIA (transient ischemic attack) 8/08   followed by neurologist Dr Leonie Man    Past Surgical History:  Procedure Laterality Date   ABDOMINAL HYSTERECTOMY     [place]     x2    Current Outpatient Medications  Medication Sig Dispense Refill   acyclovir (ZOVIRAX) 200 MG capsule Take 200 mg by mouth 5 (five) times daily as needed.      ALPRAZolam (XANAX) 0.25 MG tablet 3 (three) times daily as needed.   1   amLODipine (NORVASC) 5 MG tablet Take 5 mg by mouth daily.      aspirin EC 325 MG tablet Take 325 mg by mouth 2 (two) times daily.      desvenlafaxine (PRISTIQ) 100 MG 24 hr tablet Take 100 mg by mouth daily.     Dexmethylphenidate HCl 40 MG CP24 Take 40 mg daily by mouth.     L-Methylfolate-Algae (DEPLIN 15 PO) Take 1 tablet daily by mouth.     levothyroxine (SYNTHROID, LEVOTHROID) 25 MCG tablet Take 25 mcg by mouth daily before breakfast.     losartan-hydrochlorothiazide (HYZAAR) 100-25 MG per tablet Take 1 tablet by mouth  daily.     nadolol (CORGARD) 20 MG tablet Take 1 tablet (20 mg total) by mouth daily. PLEASE CONTACT OFFICE FOR ADDITIONAL REFILLS. 2ND ATTEMPT 30 tablet 0   ondansetron (ZOFRAN ODT) 4 MG disintegrating tablet Take 1 tablet (4 mg total) by mouth every 8 (eight) hours as needed for nausea or vomiting. 12 tablet 0   potassium chloride SA (K-DUR,KLOR-CON) 20 MEQ tablet Take 10 mEq by mouth daily.     pramipexole (MIRAPEX) 0.125 MG tablet   11   ranitidine (ZANTAC) 150 MG tablet Take 150 mg by mouth 2 (two) times daily.     rosuvastatin (CRESTOR) 10 MG tablet Take 10 mg by mouth daily.      traZODone (DESYREL) 50 MG tablet Take 50 mg by mouth at bedtime as needed for sleep.     No current facility-administered medications for this visit.     Allergies:   Penicillins, Iodinated diagnostic agents, Iohexol, and Nsaids   Social History:  The patient  reports that she has never smoked. She has never used smokeless tobacco. She reports that she does not drink alcohol and does not use drugs.   Family History:  The patient's family history includes Breast cancer in her mother; Dementia in her maternal aunt; Heart attack in her father; Hyperlipidemia in her father and sister; Hypertension in her father and sister.  ROS:  Please see the history of present illness. All other systems are reviewed and otherwise negative.   PHYSICAL EXAM:  VS:  There were no vitals taken for this visit. BMI: There is no height or weight on file to calculate BMI. Well nourished, well developed, in no acute distress  HEENT: normocephalic, atraumatic  Neck: no JVD, carotid bruits or masses Cardiac:  RRR; no significant murmurs, no rubs, or gallops Lungs:  CTA b/l, no wheezing, rhonchi or rales  Abd: soft, nontender MS: no deformity or atrophy Ext: no edema  Skin: warm and dry, no rash Neuro:  No gross deficits appreciated Psych: euthymic mood, full affect   EKG:  Done today and reviewed by myself shows  SR 79bpm, QTc 440ms   10/06/2018: stress myoview The left ventricular ejection fraction is hyperdynamic (>65%). Nuclear stress EF: 67%. There was no ST segment deviation noted during stress. The study is normal. This is a low risk study. No prior study for comparison.   12/18/2009: TTE Study Conclusions   - Left ventricle: The cavity size was normal. Systolic function was     normal. The estimated ejection fraction was in the range of 60% to     65%. Although no diagnostic regional wall motion abnormality was     identified, this possibility cannot be completely excluded on the     basis of this study. Doppler parameters are consistent with     abnormal left ventricular relaxation (grade 1 diastolic     dysfunction).   - Right ventricle:  The cavity size was normal. Wall thickness was at     the upper limits of normal.   - Pericardium, extracardiac: A trivial pericardial effusion was     identified.   Impressions:   No cardiac source of emboli was indentified.   Recommendations: Consider transesophageal echocardiography if   clinically indicated   Recent Labs: 09/24/2020: BUN 12; Creatinine, Ser 0.94; Hemoglobin 12.9; Platelets 305; Potassium 3.0; Sodium 133  No results found for requested labs within last 8760 hours.   CrCl cannot be calculated (Patient's most recent lab result is older than  the maximum 21 days allowed.).   Wt Readings from Last 3 Encounters:  10/18/19 167 lb (75.8 kg)  06/29/19 166 lb (75.3 kg)  10/06/18 185 lb (83.9 kg)     Other studies reviewed: Additional studies/records reviewed today include: summarized above  ASSESSMENT AND PLAN:  1. Prolonged QT     Reports compliance with her nadolol     QT looks OK  Reviewed her med list, re-discussed her trazodone, she again reports uses infrequently for sleep, rarely really, leaves her feeling a bit sedated I again discussed recommendation that she not use this and ask her PMD for an alternative without QT prolongation proprties  She was Rx zofran when ill with COVID acutely, does not have any more, but did ues it  2. HTN     Elevated though she mentions acutely increased personal stressors and thinks this is the problem     Asked her to monitor at home and if not improved from today's reading to let me know  3. HLD     PMD monitors and manages  4. Post COVID months now and remains with significant SOB/DOE Will check an echo, if ok, plan refer to pulmonary      Disposition: f/u 25mo, sooner if needed  Current medicines are reviewed at length with the patient today.  The patient did not have any concerns regarding medicines.  Venetia Night, PA-C 01/28/2021 6:24 PM     [address] [place] [address] [phone] (office)  [phone] (fax)

## 2021-01-29 ENCOUNTER — Other Ambulatory Visit: Payer: Self-pay

## 2021-01-29 ENCOUNTER — Ambulatory Visit: Payer: PPO | Admitting: Physician Assistant

## 2021-01-29 ENCOUNTER — Encounter: Payer: Self-pay | Admitting: Physician Assistant

## 2021-01-29 VITALS — BP 152/98 | HR 76 | Ht 60.0 in | Wt 191.0 lb

## 2021-01-29 DIAGNOSIS — R9431 Abnormal electrocardiogram [ECG] [EKG]: Secondary | ICD-10-CM

## 2021-01-29 DIAGNOSIS — I10 Essential (primary) hypertension: Secondary | ICD-10-CM | POA: Diagnosis not present

## 2021-01-29 DIAGNOSIS — R0602 Shortness of breath: Secondary | ICD-10-CM | POA: Diagnosis not present

## 2021-01-29 MED ORDER — NADOLOL 20 MG PO TABS: 20.0000 mg | ORAL_TABLET | Freq: Every day | ORAL | 2 refills | Status: DC

## 2021-01-29 NOTE — Patient Instructions (Signed)
Medication Instructions:    STOP TAKING ZOFRAN   *If you need a refill on your cardiac medications before your next appointment, please call your pharmacy*   Lab Work: NONE ORDERED  TODAY   If you have labs (blood work) drawn today and your tests are completely normal, you will receive your results only by: [place] (if you have MyChart) OR A paper copy in the mail If you have any lab test that is abnormal or we need to change your treatment, we will call you to review the results.   Testing/Procedures:  Your physician has requested that you have an echocardiogram. Echocardiography is a painless test that uses sound waves to create images of your heart. It provides your doctor with information about the size and shape of your heart and how well your heart's chambers and valves are working. This procedure takes approximately one hour. There are no restrictions for this procedure.    Follow-Up: At [facility], you and your health needs are our priority.  As part of our continuing mission to provide you with exceptional heart care, we have created designated Provider Care Teams.  These Care Teams include your primary Cardiologist (physician) and Advanced Practice Providers (APPs -  Physician Assistants and Nurse Practitioners) who all work together to provide you with the care you need, when you need it.  We recommend signing up for the patient portal called "MyChart".  Sign up information is provided on this After Visit Summary.  MyChart is used to connect with patients for Virtual Visits (Telemedicine).  Patients are able to view lab/test results, encounter notes, upcoming appointments, etc.  Non-urgent messages can be sent to your provider as well.   To learn more about what you can do with MyChart, go to [email or web address].    Your next appointment:   6 month(s)  The format for your next appointment:   In Person  Provider:   You will see one of the following Advanced  Practice Providers on your designated Care Team:   Tommye Standard, [place]   Other Instructions

## 2021-02-18 DIAGNOSIS — E7800 Pure hypercholesterolemia, unspecified: Secondary | ICD-10-CM | POA: Diagnosis not present

## 2021-02-18 DIAGNOSIS — K219 Gastro-esophageal reflux disease without esophagitis: Secondary | ICD-10-CM | POA: Diagnosis not present

## 2021-02-18 DIAGNOSIS — N189 Chronic kidney disease, unspecified: Secondary | ICD-10-CM | POA: Diagnosis not present

## 2021-02-18 DIAGNOSIS — M8580 Other specified disorders of bone density and structure, unspecified site: Secondary | ICD-10-CM | POA: Diagnosis not present

## 2021-02-18 DIAGNOSIS — E785 Hyperlipidemia, unspecified: Secondary | ICD-10-CM | POA: Diagnosis not present

## 2021-02-18 DIAGNOSIS — I639 Cerebral infarction, unspecified: Secondary | ICD-10-CM | POA: Diagnosis not present

## 2021-02-18 DIAGNOSIS — F329 Major depressive disorder, single episode, unspecified: Secondary | ICD-10-CM | POA: Diagnosis not present

## 2021-02-18 DIAGNOSIS — E039 Hypothyroidism, unspecified: Secondary | ICD-10-CM | POA: Diagnosis not present

## 2021-02-18 DIAGNOSIS — I10 Essential (primary) hypertension: Secondary | ICD-10-CM | POA: Diagnosis not present

## 2021-02-19 DIAGNOSIS — N6001 Solitary cyst of right breast: Secondary | ICD-10-CM | POA: Diagnosis not present

## 2021-02-19 DIAGNOSIS — R928 Other abnormal and inconclusive findings on diagnostic imaging of breast: Secondary | ICD-10-CM | POA: Diagnosis not present

## 2021-02-21 ENCOUNTER — Ambulatory Visit (HOSPITAL_COMMUNITY): Payer: PPO | Attending: Internal Medicine

## 2021-02-21 ENCOUNTER — Other Ambulatory Visit: Payer: Self-pay

## 2021-02-21 DIAGNOSIS — R9431 Abnormal electrocardiogram [ECG] [EKG]: Secondary | ICD-10-CM | POA: Insufficient documentation

## 2021-02-21 DIAGNOSIS — R0609 Other forms of dyspnea: Secondary | ICD-10-CM

## 2021-02-21 LAB — ECHOCARDIOGRAM COMPLETE
Area-P 1/2: 5.16 cm2
S' Lateral: 3.2 cm

## 2021-03-15 ENCOUNTER — Emergency Department (HOSPITAL_COMMUNITY)
Admission: EM | Admit: 2021-03-15 | Discharge: 2021-03-15 | Discharge status: Against Medical Advice | Payer: Medicare Other | Attending: Emergency Medicine | Admitting: Emergency Medicine

## 2021-03-15 ENCOUNTER — Encounter (HOSPITAL_COMMUNITY): Payer: Self-pay

## 2021-03-15 ENCOUNTER — Emergency Department (HOSPITAL_COMMUNITY): Payer: Medicare Other

## 2021-03-15 ENCOUNTER — Other Ambulatory Visit: Payer: Self-pay | Admitting: *Deleted

## 2021-03-15 ENCOUNTER — Other Ambulatory Visit: Payer: Self-pay

## 2021-03-15 ENCOUNTER — Telehealth: Payer: Self-pay | Admitting: *Deleted

## 2021-03-15 DIAGNOSIS — R0609 Other forms of dyspnea: Secondary | ICD-10-CM

## 2021-03-15 DIAGNOSIS — R0602 Shortness of breath: Secondary | ICD-10-CM | POA: Insufficient documentation

## 2021-03-15 DIAGNOSIS — Z5321 Procedure and treatment not carried out due to patient leaving prior to being seen by health care provider: Secondary | ICD-10-CM | POA: Insufficient documentation

## 2021-03-15 DIAGNOSIS — I517 Cardiomegaly: Secondary | ICD-10-CM | POA: Diagnosis not present

## 2021-03-15 LAB — BASIC METABOLIC PANEL
Anion gap: 10 (ref 5–15)
BUN: 21 mg/dL (ref 8–23)
CO2: 26 mmol/L (ref 22–32)
Calcium: 9.6 mg/dL (ref 8.9–10.3)
Chloride: 100 mmol/L (ref 98–111)
Creatinine, Ser: 0.9 mg/dL (ref 0.44–1.00)
GFR, Estimated: >60 mL/min (ref >60)
Glucose, Bld: 127 mg/dL — ABNORMAL HIGH (ref 70–99)
Potassium: 3 mmol/L — ABNORMAL LOW (ref 3.5–5.1)
Sodium: 136 mmol/L (ref 135–145)

## 2021-03-15 LAB — CBC
HCT: 40.9 % (ref 36.0–46.0)
Hemoglobin: 14.1 g/dL (ref 12.0–15.0)
MCH: 31.1 pg (ref 26.0–34.0)
MCHC: 34.5 g/dL (ref 30.0–36.0)
MCV: 90.3 fL (ref 80.0–100.0)
Platelets: 305 10*3/uL (ref 150–400)
RBC: 4.53 MIL/uL (ref 3.87–5.11)
RDW: 13.3 % (ref 11.5–15.5)
WBC: 9.5 10*3/uL (ref 4.0–10.5)
nRBC: 0 % (ref 0.0–0.2)

## 2021-03-15 LAB — TROPONIN I (HIGH SENSITIVITY): Troponin I (High Sensitivity): 4 ng/L (ref <18)

## 2021-03-15 LAB — BRAIN NATRIURETIC PEPTIDE: B Natriuretic Peptide: 58 pg/mL (ref 0.0–100.0)

## 2021-03-15 NOTE — ED Provider Triage Note (Signed)
Emergency Medicine Provider Triage Evaluation Note  Becky [name] , a 71 y.o. female  was evaluated in triage.  Pt complains of sob. Has has chronic sob since having covid in 09/2019 however this has been acutely worse starting yesterday. Has sob with minimal activity. Inhalers at home not working like they normally do.  Review of Systems  Positive: sob Negative: Chest pain, ble swelling  Physical Exam  BP (!) 173/91 (BP Location: Right Arm)    Pulse 61    Temp 97.7 F (36.5 C) (Oral)    Resp 18    Ht 5' (1.524 m)    Wt 85.7 kg    SpO2 96%    BMI 36.91 kg/m  Gen:   Awake, no distress   Resp:  Normal effort MSK:   Moves extremities without difficulty  Other:  Heart with rrr, no murmur, lungs largely cta with scant wheezing, no ble edema  Medical Decision Making  Medically screening exam initiated at 1:15 PM.  Appropriate orders placed.  Becky [name] was informed that the remainder of the evaluation will be completed by another provider, this initial triage assessment does not replace that evaluation, and the importance of remaining in the ED until their evaluation is complete.     Rodney Booze, PA-C 03/15/21 1316

## 2021-03-15 NOTE — ED Triage Notes (Signed)
Patient c/o SOB x 2 days. Patient denies any other symptoms.

## 2021-03-15 NOTE — Telephone Encounter (Signed)
Spoke with patient who is ready for her referral to the Pulmonology for shortness of breath and Dyspnea on exertion. Referral put in Epic

## 2021-03-19 ENCOUNTER — Other Ambulatory Visit: Payer: Self-pay

## 2021-03-19 ENCOUNTER — Ambulatory Visit (INDEPENDENT_AMBULATORY_CARE_PROVIDER_SITE_OTHER): Payer: Medicare Other | Admitting: Nurse Practitioner

## 2021-03-19 ENCOUNTER — Encounter: Payer: Self-pay | Admitting: Nurse Practitioner

## 2021-03-19 DIAGNOSIS — Z8616 Personal history of COVID-19: Secondary | ICD-10-CM | POA: Diagnosis not present

## 2021-03-19 DIAGNOSIS — U099 Post covid-19 condition, unspecified: Secondary | ICD-10-CM | POA: Diagnosis not present

## 2021-03-19 DIAGNOSIS — R0609 Other forms of dyspnea: Secondary | ICD-10-CM | POA: Diagnosis not present

## 2021-03-19 MED ORDER — MONTELUKAST SODIUM 10 MG PO TABS: 10.0000 mg | ORAL_TABLET | Freq: Every day | ORAL | 3 refills | Status: DC

## 2021-03-19 NOTE — Patient Instructions (Addendum)
History of Covid 19 Shortness of breath:   Stay well hydrated  Stay active  Deep breathing exercises  May take tylenol or fever or pain  Will order Singulair  Continue current inhalers  Will order PT  Will order    Follow up:  Follow up in 3 months or sooner if needed

## 2021-03-19 NOTE — Progress Notes (Signed)
Virtual Visit via Telephone Note  I connected with Becky [name] on 03/19/21 at  1:00 PM EST by telephone and verified that I am speaking with the correct person using two identifiers.  Location: Patient: home Provider: office   I discussed the limitations, risks, security and privacy concerns of performing an evaluation and management service by telephone and the availability of in person appointments. I also discussed with the patient that there may be a patient responsible charge related to this service. The patient expressed understanding and agreed to proceed.   History of Present Illness:  Patient presents today for presents today for post-COVID presents today for post-COVID care clinic visit through telephone visit.  Patient states that she tested positive for COVID in July 2022.  She did recently have a chest x-ray last week which showed minimal scarring.  Patient states that she continues to have ongoing issues with shortness of breath since COVID.  Patient has never smoked.  She has had a complete work-up through cardiology which was normal.  She was advised by cardiology to be seen by pulmonary.  Patient is currently on albuterol as needed and Flovent.  She states that she has shortness of breath at rest and especially with exertion.  We discussed that we can get can get her set up to start can get her set up to start physical therapy for physical reconditioning.  We will also place a referral to pulmonary for further evaluation. Denies f/c/s, n/v/d, hemoptysis, PND, chest pain or edema.   Observations/Objective:  Vitals with BMI 03/15/2021 03/15/2021 03/15/2021  Height - - 5\' 0"   Weight - - 189 lbs  BMI - - 44.03  Systolic 474 259 -  Diastolic 93 91 -  Pulse 63 61 -      Assessment and Plan:  History of Covid 19 Shortness of breath:   Stay well hydrated  Stay active  Deep breathing exercises  May take tylenol or fever or pain  Will order Singulair  Continue  current inhalers  Will order PT  Will order    Follow up:  Follow up in 3 months or sooner if needed    I discussed the assessment and treatment plan with the patient. The patient was provided an opportunity to ask questions and all were answered. The patient agreed with the plan and demonstrated an understanding of the instructions.   The patient was advised to call back or seek an in-person evaluation if the symptoms worsen or if the condition fails to improve as anticipated.  I provided 23 minutes of non-face-to-face time during this encounter.   Fenton Foy, NP

## 2021-03-25 ENCOUNTER — Encounter: Payer: Self-pay | Admitting: Pulmonary Disease

## 2021-03-25 ENCOUNTER — Ambulatory Visit: Payer: Medicare Other | Admitting: Pulmonary Disease

## 2021-03-25 ENCOUNTER — Other Ambulatory Visit: Payer: Self-pay

## 2021-03-25 VITALS — BP 128/84 | HR 74 | Temp 98.2°F | Ht 60.0 in | Wt 191.0 lb

## 2021-03-25 DIAGNOSIS — R0602 Shortness of breath: Secondary | ICD-10-CM | POA: Diagnosis not present

## 2021-03-25 DIAGNOSIS — R5381 Other malaise: Secondary | ICD-10-CM | POA: Diagnosis not present

## 2021-03-25 DIAGNOSIS — R0609 Other forms of dyspnea: Secondary | ICD-10-CM | POA: Diagnosis not present

## 2021-03-25 DIAGNOSIS — U099 Post covid-19 condition, unspecified: Secondary | ICD-10-CM | POA: Diagnosis not present

## 2021-03-25 MED ORDER — ADVAIR HFA 45-21 MCG/ACT IN AERO: 2.0000 | INHALATION_SPRAY | Freq: Two times a day (BID) | RESPIRATORY_TRACT | 2 refills | Status: DC

## 2021-03-25 NOTE — Progress Notes (Signed)
Subjective:   PATIENT ID: Becky [name] GENDER: female DOB: [birth year], MRN: [id number]   HPI  Chief Complaint  Patient presents with   Consult    Covid July 2022 SOB    Reason for Visit: New consult for shortness of breath  Becky [name] is a 71 year old female with hx CVA, hx melanoma, HTN, CKD who presents with shortness of breath.  She was diagnosed with COVID-19 in July and treated with paxlovid. She had persistent symptoms for three weeks with cough, wheezing, shortness of breath, fatigue and insomnia. At this point she has persistent shortness of breath, wheezing with exertion and nonproductive cough. Denies prior history of asthma or frequent respiratory infections. She is albuterol twice a day. She has an prescription for Flovent but only taking if albuterol is not working. Uses it 2-3 times a week. She is walking more slowly due to fatigue and balance issues. She has to stop when walking up a flight stairs. She has to take breaks while vacuuming.   Social History: Never smoker Childhood second hand smoke exposure  I have personally reviewed patient's past medical/family/social history, allergies, current medications.  Past Medical History:  Diagnosis Date   Chronic kidney disease    CVA (cerebral vascular accident) ([place]) 11/14/2013   Depression    followed by Dr Waverly Ferrari frequently 10/01/2015   Genital herpes    History of melanoma    followed by derm Dr Delman Cheadle   Hyperlipidemia    Hypertension    MCI (mild cognitive impairment) 10/01/2015   Pure hypercholesterolemia 11/14/2013   Stroke [facility])    Old stroke seen on MRI 12/2009, carotid doppler w minimal plaque, MRI off the head otherwise neg for acute disease.   Thyroid disease    hypothyroid. Followed by Dr Toy Care   TIA (transient ischemic attack) 8/08   followed by neurologist Dr Leonie Man     Family History  Problem Relation Age of Onset   Breast cancer Mother    Hyperlipidemia Father     Hypertension Father    Heart attack Father    Hypertension Sister    Hyperlipidemia Sister    Dementia Maternal Aunt      Social History   Occupational History   Not on file  Tobacco Use   Smoking status: Never   Smokeless tobacco: Never  Vaping Use   Vaping Use: Never used  Substance and Sexual Activity   Alcohol use: No   Drug use: No   Sexual activity: Never    Allergies  Allergen Reactions   Penicillins Anaphylaxis   Iodinated Contrast Media Other (See Comments)   Iohexol      Desc: iodine    Nsaids     CKD      Outpatient Medications Prior to Visit  Medication Sig Dispense Refill   acyclovir (ZOVIRAX) 200 MG capsule Take 200 mg by mouth 5 (five) times daily as needed.      albuterol (VENTOLIN HFA) 108 (90 Base) MCG/ACT inhaler as needed for wheezing.     ALPRAZolam (XANAX) 0.25 MG tablet 3 (three) times daily as needed.   1   amLODipine (NORVASC) 5 MG tablet Take 5 mg by mouth daily.      ARIPiprazole (ABILIFY) 5 MG tablet Take 5 mg by mouth daily.     aspirin EC 325 MG tablet Take 325 mg by mouth 2 (two) times daily.      desvenlafaxine (PRISTIQ) 100 MG 24 hr tablet  Take 100 mg by mouth daily.     desvenlafaxine (PRISTIQ) 25 MG 24 hr tablet Take 25 mg by mouth daily. Take with 100mg  tab     Dexmethylphenidate HCl 40 MG CP24 Take 40 mg daily by mouth.     L-Methylfolate-Algae (DEPLIN 15 PO) Take 1 tablet daily by mouth.     levothyroxine (SYNTHROID, LEVOTHROID) 25 MCG tablet Take 25 mcg by mouth daily before breakfast.     losartan-hydrochlorothiazide (HYZAAR) 100-25 MG per tablet Take 1 tablet by mouth daily.     omeprazole (PRILOSEC) 40 MG capsule Take 40 mg by mouth every morning.     potassium chloride (KLOR-CON M) 10 MEQ tablet Take 10 mEq by mouth daily.     rosuvastatin (CRESTOR) 10 MG tablet Take 10 mg by mouth daily.     telmisartan (MICARDIS) 80 MG tablet Take 80 mg by mouth daily.     traZODone (DESYREL) 50 MG tablet Take 50 mg by mouth at bedtime  as needed for sleep.     FLOVENT HFA 110 MCG/ACT inhaler 1 puff 2 (two) times daily.     fluticasone (FLOVENT HFA) 110 MCG/ACT inhaler 1 puff     montelukast (SINGULAIR) 10 MG tablet Take 1 tablet (10 mg total) by mouth at bedtime. (Patient not taking: Reported on 03/25/2021) 30 tablet 3   nadolol (CORGARD) 20 MG tablet Take 1 tablet (20 mg total) by mouth daily. (Patient not taking: Reported on 03/25/2021) 90 tablet 2   pramipexole (MIRAPEX) 0.125 MG tablet  (Patient not taking: Reported on 03/25/2021)  11   No facility-administered medications prior to visit.    Review of Systems  Constitutional:  Positive for malaise/fatigue. Negative for chills, diaphoresis, fever and weight loss.  HENT:  Negative for congestion, ear pain and sore throat.   Respiratory:  Positive for cough, shortness of breath and wheezing. Negative for hemoptysis and sputum production.   Cardiovascular:  Negative for chest pain, palpitations and leg swelling.  Gastrointestinal:  Negative for abdominal pain, heartburn and nausea.  Genitourinary:  Negative for frequency.  Musculoskeletal:  Negative for joint pain and myalgias.  Skin:  Negative for itching and rash.  Neurological:  Positive for dizziness. Negative for weakness and headaches.  Endo/Heme/Allergies:  Does not bruise/bleed easily.  Psychiatric/Behavioral:  Positive for depression. The patient is not nervous/anxious.     Objective:   Vitals:   03/25/21 1458  BP: 128/84  Pulse: 74  Temp: 98.2 F (36.8 C)  TempSrc: Oral  SpO2: 97%  Weight: 191 lb (86.6 kg)  Height: 5' (1.524 m)   SpO2: 97 %  Physical Exam: General: Well-appearing, no acute distress HENT: [place], AT Eyes: EOMI, no scleral icterus Respiratory: Clear to auscultation bilaterally.  No crackles, wheezing or rales Cardiovascular: RRR, -M/R/G, no JVD Extremities:-Edema,-tenderness Neuro: AAO x4, CNII-XII grossly intact Psych: Normal mood, normal affect  Data Reviewed:  Imaging: CXR  03/15/21 - Lingular scarring. No infiltrate, effusion or edema  PFT: None on file  Labs: CBC    Component Value Date/Time   WBC 9.5 03/15/2021 1329   RBC 4.53 03/15/2021 1329   HGB 14.1 03/15/2021 1329   HGB 13.0 06/30/2013 1300   HCT 40.9 03/15/2021 1329   HCT 38.7 06/30/2013 1300   PLT 305 03/15/2021 1329   PLT 341 06/30/2013 1300   MCV 90.3 03/15/2021 1329   MCV 89 06/30/2013 1300   MCH 31.1 03/15/2021 1329   MCHC 34.5 03/15/2021 1329   RDW 13.3 03/15/2021 1329  RDW 13.4 06/30/2013 1300   LYMPHSABS 2.4 09/24/2020 1812   LYMPHSABS 2.7 06/30/2013 1300   MONOABS 1.4 (H) 09/24/2020 1812   MONOABS 1.1 (H) 06/30/2013 1300   EOSABS 0.4 09/24/2020 1812   EOSABS 0.2 06/30/2013 1300   BASOSABS 0.0 09/24/2020 1812   BASOSABS 0.1 06/30/2013 1300   BMET    Component Value Date/Time   NA 136 03/15/2021 1329   NA 140 06/30/2013 1300   K 3.0 (L) 03/15/2021 1329   K 3.2 (L) 06/30/2013 1300   CL 100 03/15/2021 1329   CL 106 06/30/2013 1300   CO2 26 03/15/2021 1329   CO2 26 06/30/2013 1300   GLUCOSE 127 (H) 03/15/2021 1329   GLUCOSE 93 06/30/2013 1300   BUN 21 03/15/2021 1329   BUN 21 (H) 06/30/2013 1300   CREATININE 0.90 03/15/2021 1329   CREATININE 1.27 06/30/2013 1300   CALCIUM 9.6 03/15/2021 1329   CALCIUM 9.4 06/30/2013 1300   GFRNONAA >60 03/15/2021 1329   GFRNONAA 45 (L) 06/30/2013 1300   Mild hypokalemia     Assessment & Plan:   Discussion: 71 year old female with hx CVA, hx melanoma, HTN, CKD who presents with shortness of breath. Suspect long hauler symptoms after COVID infection in 08/2021. We reviewed clinical course of COVID-19 including long-term complications including post-inflammatory lung disease and long hauler symptoms. Will evaluate with PFTs to rule out underlying lung disease and start ICS/LABA for symptom relief. We also discussed how deconditioning can significantly contribute to symptoms and counseled on steps to daily aerobic exercise.  COVID-19  long hauler manifesting in dyspnea START Advair 45-21 mcg TWO puffs TWICE a day STOP Flovent  ARRANGE for pulmonary function tests as soon as available SCHEDULE follow-up after PFTs (OK for two different days) ENCOURAGE regular activity with goal 30 minutes daily  Dizziness, balance abnormality Planning to work with physical therapy Declined neurology referral at this time but would consider if PT ineffective  Health Maintenance Immunization History  Administered Date(s) Administered   Influenza, High Dose Seasonal PF 11/05/2016, 12/09/2017, 12/16/2018, 12/29/2019   Influenza,inj,quad, With Preservative 10/19/2014, 10/22/2015   PFIZER(Purple Top)SARS-COV-2 Vaccination 05/14/2019, 06/04/2019   Pneumococcal Conjugate-13 11/05/2016   Pneumococcal Polysaccharide-23 12/25/2017   Td 10/19/2014   Zoster Recombinat (Shingrix) 12/01/2017, 11/19/2018   Zoster, Live 12/01/2017, 11/19/2018   CT Lung Screen - not qualified. Never smoker  Orders Placed This Encounter  Procedures   Pulmonary function test    Standing Status:   Future    Standing Expiration Date:   03/25/2022    Order Specific Question:   Where should this test be performed?    Answer:   [facility]    Order Specific Question:   Full PFT: includes the following: basic spirometry, spirometry pre & post bronchodilator, diffusion capacity (DLCO), lung volumes    Answer:   Full PFT   Meds ordered this encounter  Medications   fluticasone-salmeterol (ADVAIR HFA) 45-21 MCG/ACT inhaler    Sig: Inhale 2 puffs into the lungs 2 (two) times daily.    Dispense:  1 each    Refill:  2    Return in about 6 weeks (around 05/06/2021).  I have spent a total time of 45-minutes on the day of the appointment reviewing prior documentation, coordinating care and discussing medical diagnosis and plan with the patient/family. Imaging, labs and tests included in this note have been reviewed and interpreted independently by me.  Naelle Diegel Rodman Pickle, MD [facility] 03/25/2021 7:04  PM  Office Number [phone]

## 2021-03-25 NOTE — Patient Instructions (Addendum)
COVID-19 long hauler manifesting in dyspnea START Advair 45-21 mcg TWO puffs TWICE a day STOP Flovent  ARRANGE for pulmonary function tests as soon as available SCHEDULE follow-up after PFTs (OK for two different days) ENCOURAGE regular activity with goal 30 minutes daily  Follow-up with me after PFTs

## 2021-04-18 DIAGNOSIS — K219 Gastro-esophageal reflux disease without esophagitis: Secondary | ICD-10-CM | POA: Diagnosis not present

## 2021-04-18 DIAGNOSIS — E039 Hypothyroidism, unspecified: Secondary | ICD-10-CM | POA: Diagnosis not present

## 2021-04-18 DIAGNOSIS — Z0000 Encounter for general adult medical examination without abnormal findings: Secondary | ICD-10-CM | POA: Diagnosis not present

## 2021-04-18 DIAGNOSIS — E785 Hyperlipidemia, unspecified: Secondary | ICD-10-CM | POA: Diagnosis not present

## 2021-04-18 DIAGNOSIS — R110 Nausea: Secondary | ICD-10-CM | POA: Diagnosis not present

## 2021-04-18 DIAGNOSIS — I10 Essential (primary) hypertension: Secondary | ICD-10-CM | POA: Diagnosis not present

## 2021-04-22 NOTE — Progress Notes (Incomplete)
OUTPATIENT PHYSICAL THERAPY NEURO EVALUATION   Patient Name: Becky [name] MRN: [id number] DOB:[birth year], 71 y.o., female Today's Date: 04/22/2021  PCP: System, Provider Not In REFERRING PROVIDER: Fenton Foy, NP    Past Medical History:  Diagnosis Date   Chronic kidney disease    CVA (cerebral vascular accident) ([place]) 11/14/2013   Depression    followed by Dr Waverly Ferrari frequently 10/01/2015   Genital herpes    History of melanoma    followed by derm Dr Delman Cheadle   Hyperlipidemia    Hypertension    MCI (mild cognitive impairment) 10/01/2015   Pure hypercholesterolemia 11/14/2013   Stroke [facility])    Old stroke seen on MRI 12/2009, carotid doppler w minimal plaque, MRI off the head otherwise neg for acute disease.   Thyroid disease    hypothyroid. Followed by Dr Toy Care   TIA (transient ischemic attack) 8/08   followed by neurologist Dr Leonie Man   Past Surgical History:  Procedure Laterality Date   ABDOMINAL HYSTERECTOMY     [place]     x2   Patient Active Problem List   Diagnosis Date Noted   History of COVID-19 03/19/2021   COVID-19 long hauler manifesting chronic dyspnea 03/19/2021   Thyroid disease    Stroke [facility])    Hypertension    Hyperlipidemia    History of melanoma    Genital herpes    Depression    Chronic kidney disease    MCI (mild cognitive impairment) 10/01/2015   Falls frequently 10/01/2015   Essential hypertension, benign 11/14/2013   CVA (cerebral vascular accident) ([place]) 11/14/2013   Pure hypercholesterolemia 11/14/2013   TIA (transient ischemic attack) 10/02/2006    ONSET DATE: 03/03/2020  REFERRING DIAG: history of covid 19; long hauler manifesting chronic dyspnea   THERAPY DIAG:  Weakness; difficulty in walking   SUBJECTIVE:   SUBJECTIVE STATEMENT: ***                                                                                                                                                                                                              PERTINENT HISTORY: ***  PAIN:  Are you having pain? {yes/no:20286} NPRS scale: ***/10 Pain location: *** Pain orientation: {Pain Orientation:25161}  PAIN TYPE: {type:313116} Pain description: {PAIN DESCRIPTION:21022940}  Aggravating factors: *** Relieving factors: ***  PRECAUTIONS: {Therapy precautions:24002}  WEIGHT BEARING RESTRICTIONS No  FALLS: Has patient fallen in last 6 months? {yes/no:20286}, Number of falls: ***  LIVING  ENVIRONMENT: Lives with: {[facility] lives with:25569::"lives with their family"} Lives in: {Lives in:25570} Stairs: {yes/no:20286}; {Stairs:24000} Has following equipment at home: {Assistive devices:23999}  PLOF: Independent with basic ADLs  PATIENT GOALS ***  OBJECTIVE:   DIAGNOSTIC FINDINGS: ***  COGNITION: Overall cognitive status: {cognition:24006} Areas of impairment: {cognitive impairment:24009} Commands: {commands:24018} Attention: {intact/deficits:24005} Memory: {intact/deficits:24005} Awareness: {intact/deficits:24005} Problem solving: {intact/deficits:24005} Executive function:{Executive functioning:24008} Behavior: {behavior:24019}   SENSATION: Light touch: {intact/deficits:24005} Stereognosis: {intact/deficits:24005} Hot/Cold: {intact/deficits:24005} Proprioception: {intact/deficits:24005}   MUSCLE LENGTH: Hamstrings: Right *** deg; Left *** deg Thomas test: Right *** deg; Left *** deg    POSTURE: {posture:25561}  AROM/PROM:  A/PROM Right 04/23/2021 Left 04/23/2021  Hip flexion    Hip extension    Hip abduction    Hip adduction    Hip internal rotation    Hip external rotation    Knee flexion    Knee extension    Ankle dorsiflexion    Ankle plantarflexion    Ankle inversion    Ankle eversion     (Blank rows = not tested) MMT:  MMT Right  Left   Hip flexion    Hip extension    Hip abduction    Hip adduction    Hip internal rotation     Hip external rotation    Knee flexion    Knee extension    Ankle dorsiflexion    Ankle plantarflexion    Ankle inversion    Ankle eversion    (Blank rows = not tested)  BED MOBILITY:  {Bed mobility:24027}  TRANSFERS: Assistive device utilized: {Assistive devices:23999}  Sit to stand: {Levels of assistance:24026} Stand to sit: {Levels of assistance:24026} Chair to chair: {Levels of assistance:24026} Floor: {Levels of assistance:24026}  RAMP {Levels of assistance:24026}  CURB: {Levels of assistance:24026}  GAIT: Gait pattern: {gait characteristics:25376} Distance walked: *** Assistive device utilized: {Assistive devices:23999} Level of assistance: {Levels of assistance:24026} Comments: ***  FUNCTIONAL TESTs:  {Functional tests:24029}  PATIENT SURVEYS:  {rehab surveys:24030}  TODAY'S TREATMENT:  ***   PATIENT EDUCATION: Education details: *** Person educated: {Person educated:25204} Education method: {Education Method:25205} Education comprehension: {Education Comprehension:25206}   HOME EXERCISE PROGRAM: ***  ASSESSMENT:  CLINICAL IMPRESSION: Patient is a 71 y.o. female who was seen today for physical therapy evaluation and treatment for long hauler covid symptoms with chronic dyspnea.    OBJECTIVE IMPAIRMENTS {opptimpairments:25111}.   ACTIVITY LIMITATIONS {activity limitations:25113}.   PERSONAL FACTORS {Personal factors:25162} are also affecting patient's functional outcome.    REHAB POTENTIAL: {rehabpotential:25112}  CLINICAL DECISION MAKING: {clinical decision making:25114}  EVALUATION COMPLEXITY: {Evaluation complexity:25115}   GOALS: Goals reviewed with patient? Yes  SHORT TERM GOALS:  STG Name Target Date Goal status  1 *** Baseline:  {follow up:25551} {GOALSTATUS:25110}  2 *** Baseline:  {follow up:25551} {GOALSTATUS:25110}  3 *** Baseline: {follow up:25551} {GOALSTATUS:25110}  4 *** Baseline: {follow up:25551}  {GOALSTATUS:25110}  5 *** Baseline: {follow up:25551} {GOALSTATUS:25110}  6 *** Baseline: {follow up:25551} {GOALSTATUS:25110}  7 *** Baseline: {follow up:25551} {GOALSTATUS:25110}   LONG TERM GOALS:   LTG Name Target Date Goal status  1 *** Baseline: {follow up:25551} {GOALSTATUS:25110}  2 *** Baseline: {follow up:25551} {GOALSTATUS:25110}  3 *** Baseline: {follow up:25551} {GOALSTATUS:25110}  4 *** Baseline: {follow up:25551} {GOALSTATUS:25110}  5 *** Baseline: {follow up:25551} {GOALSTATUS:25110}  6 *** Baseline: {follow up:25551} {GOALSTATUS:25110}  7 *** Baseline: {follow up:25551} {GOALSTATUS:25110}   PLAN: PT FREQUENCY: 2x/week  PT DURATION: 8 weeks  PLANNED INTERVENTIONS: Therapeutic exercises, Therapeutic activity, Neuro Muscular re-education, Balance training, Gait training, Patient/Family education, Joint mobilization, and  Aquatic Therapy, manual therapy  PLAN FOR NEXT SESSION: Alvera Singh, PT 04/22/2021, 1:16 PM

## 2021-04-23 ENCOUNTER — Ambulatory Visit: Payer: Medicare Other | Admitting: Physical Therapy

## 2021-04-29 DIAGNOSIS — I10 Essential (primary) hypertension: Secondary | ICD-10-CM | POA: Diagnosis not present

## 2021-04-29 DIAGNOSIS — E7800 Pure hypercholesterolemia, unspecified: Secondary | ICD-10-CM | POA: Diagnosis not present

## 2021-04-29 DIAGNOSIS — E039 Hypothyroidism, unspecified: Secondary | ICD-10-CM | POA: Diagnosis not present

## 2021-05-06 ENCOUNTER — Other Ambulatory Visit: Payer: Self-pay

## 2021-05-06 ENCOUNTER — Ambulatory Visit: Payer: Medicare Other | Admitting: Pulmonary Disease

## 2021-05-06 ENCOUNTER — Ambulatory Visit (INDEPENDENT_AMBULATORY_CARE_PROVIDER_SITE_OTHER): Payer: Medicare Other | Admitting: Pulmonary Disease

## 2021-05-06 ENCOUNTER — Encounter: Payer: Self-pay | Admitting: Pulmonary Disease

## 2021-05-06 VITALS — BP 132/90 | HR 76 | Ht 60.0 in | Wt 191.0 lb

## 2021-05-06 DIAGNOSIS — R0609 Other forms of dyspnea: Secondary | ICD-10-CM | POA: Diagnosis not present

## 2021-05-06 DIAGNOSIS — J4540 Moderate persistent asthma, uncomplicated: Secondary | ICD-10-CM | POA: Insufficient documentation

## 2021-05-06 DIAGNOSIS — U099 Post covid-19 condition, unspecified: Secondary | ICD-10-CM | POA: Diagnosis not present

## 2021-05-06 DIAGNOSIS — J449 Chronic obstructive pulmonary disease, unspecified: Secondary | ICD-10-CM | POA: Diagnosis not present

## 2021-05-06 LAB — PULMONARY FUNCTION TEST
DL/VA % pred: 100 %
DL/VA: 4.3 ml/min/mmHg/L
DLCO cor % pred: 89 %
DLCO cor: 15.18 ml/min/mmHg
DLCO unc % pred: 89 %
DLCO unc: 15.18 ml/min/mmHg
FEF 25-75 Post: 1.7 L/sec
FEF 25-75 Pre: 1.37 L/sec
FEF2575-%Change-Post: 24 %
FEF2575-%Pred-Post: 101 %
FEF2575-%Pred-Pre: 81 %
FEV1-%Change-Post: 4 %
FEV1-%Pred-Post: 79 %
FEV1-%Pred-Pre: 75 %
FEV1-Post: 1.5 L
FEV1-Pre: 1.43 L
FEV1FVC-%Change-Post: -1 %
FEV1FVC-%Pred-Pre: 107 %
FEV6-%Change-Post: 6 %
FEV6-%Pred-Post: 78 %
FEV6-%Pred-Pre: 73 %
FEV6-Post: 1.88 L
FEV6-Pre: 1.76 L
FEV6FVC-%Pred-Post: 104 %
FEV6FVC-%Pred-Pre: 104 %
FVC-%Change-Post: 6 %
FVC-%Pred-Post: 74 %
FVC-%Pred-Pre: 69 %
FVC-Post: 1.88 L
FVC-Pre: 1.76 L
Post FEV1/FVC ratio: 80 %
Post FEV6/FVC ratio: 100 %
Pre FEV1/FVC ratio: 81 %
Pre FEV6/FVC Ratio: 100 %
RV % pred: 170 %
RV: 3.39 L
TLC % pred: 127 %
TLC: 5.69 L

## 2021-05-06 MED ORDER — ADVAIR HFA 115-21 MCG/ACT IN AERO: 2.0000 | INHALATION_SPRAY | Freq: Two times a day (BID) | RESPIRATORY_TRACT | 5 refills | Status: DC

## 2021-05-06 NOTE — Progress Notes (Signed)
Subjective:   PATIENT ID: Becky [name] GENDER: female DOB: 1950-04-13, MRN: [id number]   HPI  Chief Complaint  Patient presents with   Follow-up    PFT review    Reason for Visit: Follow-up shortness of breath, PFTs  [name] is a 71 year old female with hx CVA, hx melanoma, HTN, CKD who presents with shortness of breath.  Initial Consult She was diagnosed with COVID-19 in July and treated with paxlovid. She had persistent symptoms for three weeks with cough, wheezing, shortness of breath, fatigue and insomnia. At this point she has persistent shortness of breath, wheezing with exertion and nonproductive cough. Denies prior history of asthma or frequent respiratory infections. She is albuterol twice a day. She has an prescription for Flovent but only taking if albuterol is not working. Uses it 2-3 times a week. She is walking more slowly due to fatigue and balance issues. She has to stop when walking up a flight stairs. She has to take breaks while vacuuming.   05/06/21 Since our last visit she was stepped up from Flovent to low-dose Advair. Able to tolerate but no significant difference. She has not been able to be active with aerobic activity but does have a new puppy that she is chasing up and down stairs. Planning to start taking her on walks when she is leash trained. Her activity is limited by nausea which she is scheduled to see GI.   Social History: Never smoker Childhood second hand smoke exposure  Past Medical History:  Diagnosis Date   Chronic kidney disease    CVA (cerebral vascular accident) ([place]) 11/14/2013   Depression    followed by Dr Waverly Ferrari frequently 10/01/2015   Genital herpes    History of melanoma    followed by derm Dr Delman Cheadle   Hyperlipidemia    Hypertension    MCI (mild cognitive impairment) 10/01/2015   Pure hypercholesterolemia 11/14/2013   Stroke [facility])    Old stroke seen on MRI 12/2009, carotid doppler w minimal plaque, MRI off the  head otherwise neg for acute disease.   Thyroid disease    hypothyroid. Followed by Dr Toy Care   TIA (transient ischemic attack) 8/08   followed by neurologist Dr Leonie Man     Family History  Problem Relation Age of Onset   Breast cancer Mother    Hyperlipidemia Father    Hypertension Father    Heart attack Father    Hypertension Sister    Hyperlipidemia Sister    Dementia Maternal Aunt      Social History   Occupational History   Not on file  Tobacco Use   Smoking status: Never   Smokeless tobacco: Never  Vaping Use   Vaping Use: Never used  Substance and Sexual Activity   Alcohol use: No   Drug use: No   Sexual activity: Never    Allergies  Allergen Reactions   Penicillins Anaphylaxis   Iodinated Contrast Media Other (See Comments)   Iohexol      Desc: iodine    Nsaids     CKD      Outpatient Medications Prior to Visit  Medication Sig Dispense Refill   acyclovir (ZOVIRAX) 200 MG capsule Take 200 mg by mouth 5 (five) times daily as needed.      albuterol (VENTOLIN HFA) 108 (90 Base) MCG/ACT inhaler as needed for wheezing.     ALPRAZolam (XANAX) 0.25 MG tablet 3 (three) times daily as needed.  1   amLODipine (NORVASC) 5 MG tablet Take 5 mg by mouth daily.      ARIPiprazole (ABILIFY) 5 MG tablet Take 5 mg by mouth daily.     aspirin EC 325 MG tablet Take 325 mg by mouth 2 (two) times daily.      desvenlafaxine (PRISTIQ) 100 MG 24 hr tablet Take 100 mg by mouth daily.     desvenlafaxine (PRISTIQ) 25 MG 24 hr tablet Take 25 mg by mouth daily. Take with '100mg'$  tab     Dexmethylphenidate HCl 40 MG CP24 Take 40 mg daily by mouth.     L-Methylfolate-Algae (DEPLIN 15 PO) Take 1 tablet daily by mouth.     levothyroxine (SYNTHROID, LEVOTHROID) 25 MCG tablet Take 25 mcg by mouth daily before breakfast.     losartan-hydrochlorothiazide (HYZAAR) 100-25 MG per tablet Take 1 tablet by mouth daily.     montelukast (SINGULAIR) 10 MG tablet Take 1 tablet (10 mg total) by mouth at  bedtime. 30 tablet 3   nadolol (CORGARD) 20 MG tablet Take 1 tablet (20 mg total) by mouth daily. 90 tablet 2   omeprazole (PRILOSEC) 40 MG capsule Take 40 mg by mouth every morning.     potassium chloride (KLOR-CON M) 10 MEQ tablet Take 10 mEq by mouth daily.     pramipexole (MIRAPEX) 0.125 MG tablet   11   rosuvastatin (CRESTOR) 10 MG tablet Take 10 mg by mouth daily.     telmisartan (MICARDIS) 80 MG tablet Take 80 mg by mouth daily.     traZODone (DESYREL) 50 MG tablet Take 50 mg by mouth at bedtime as needed for sleep.     fluticasone-salmeterol (ADVAIR HFA) 45-21 MCG/ACT inhaler Inhale 2 puffs into the lungs 2 (two) times daily. 1 each 2   No facility-administered medications prior to visit.    Review of Systems  Constitutional:  Positive for malaise/fatigue. Negative for chills, diaphoresis, fever and weight loss.  HENT:  Negative for congestion.   Respiratory:  Positive for shortness of breath. Negative for cough, hemoptysis, sputum production and wheezing.   Cardiovascular:  Negative for chest pain, palpitations and leg swelling.  Gastrointestinal:  Positive for nausea.  Neurological:  Positive for dizziness.    Objective:   Vitals:   05/06/21 1503  BP: 132/90  Pulse: 76  SpO2: 96%  Weight: 191 lb (86.6 kg)  Height: 5' (1.524 m)   SpO2: 96 % O2 Device: None (Room air)  Physical Exam: General: Well-appearing, no acute distress HENT: [place], AT Eyes: EOMI, no scleral icterus Respiratory: Clear to auscultation bilaterally.  No crackles, wheezing or rales Cardiovascular: RRR, -M/R/G, no JVD Extremities:-Edema,-tenderness Neuro: AAO x4, CNII-XII grossly intact Psych: Normal mood, normal affect  Data Reviewed:  Imaging: CXR 03/15/21 - Lingular scarring. No infiltrate, effusion or edema  PFT: 05/06/21 FVC 1.88 (74%) FEV1 1.5 (79%) Ratio 81  TLC 127% DLCO 89% Interpretation: No obstructive defect on spirometry however air trapping present and F-V curves suggestive of  obstructive defect/small airway disease. Normal gas exchange  Labs: CBC    Component Value Date/Time   WBC 9.5 03/15/2021 1329   RBC 4.53 03/15/2021 1329   HGB 14.1 03/15/2021 1329   HGB 13.0 06/30/2013 1300   HCT 40.9 03/15/2021 1329   HCT 38.7 06/30/2013 1300   PLT 305 03/15/2021 1329   PLT 341 06/30/2013 1300   MCV 90.3 03/15/2021 1329   MCV 89 06/30/2013 1300   MCH 31.1 03/15/2021 1329   MCHC 34.5 03/15/2021  1329   RDW 13.3 03/15/2021 1329   RDW 13.4 06/30/2013 1300   LYMPHSABS 2.4 09/24/2020 1812   LYMPHSABS 2.7 06/30/2013 1300   MONOABS 1.4 (H) 09/24/2020 1812   MONOABS 1.1 (H) 06/30/2013 1300   EOSABS 0.4 09/24/2020 1812   EOSABS 0.2 06/30/2013 1300   BASOSABS 0.0 09/24/2020 1812   BASOSABS 0.1 06/30/2013 1300      Assessment & Plan:   Discussion: 71 year female with history of COVID 08/2020 hx CVA, hx melanoma, hypertension, CKD who presents for follow-up.  Reviewed PFTs with patient.  Although her spirometry does not confirm obstructive lung defect she has significant air trapping and F-V loops stent with underlying obstructive process that likely explains her symptoms.  She has had some benefit from Advair increase ICS.  Also encouraged to increase physical activity as deconditioning is contributing to her symptoms.  COVID-19 long hauler manifesting in dyspnea Obstructive lung disease  INCREASE Advair 115-21 mcg TWO puffs TWICE a day  Dizziness, balance abnormality Scheduled with physical therapy Declined neurology referral at this time but would consider if PT ineffective  Health Maintenance Immunization History  Administered Date(s) Administered   Influenza, High Dose Seasonal PF 11/05/2016, 12/09/2017, 12/16/2018, 12/29/2019   Influenza,inj,quad, With Preservative 10/19/2014, 10/22/2015   PFIZER(Purple Top)SARS-COV-2 Vaccination 05/14/2019, 06/04/2019   Pneumococcal Conjugate-13 11/05/2016   Pneumococcal Polysaccharide-23 12/25/2017   Td 10/19/2014    Zoster Recombinat (Shingrix) 12/01/2017, 11/19/2018   Zoster, Live 12/01/2017, 11/19/2018   CT Lung Screen - not qualified. Never smoker  No orders of the defined types were placed in this encounter.  Meds ordered this encounter  Medications   fluticasone-salmeterol (ADVAIR HFA) 115-21 MCG/ACT inhaler    Sig: Inhale 2 puffs into the lungs 2 (two) times daily.    Dispense:  1 each    Refill:  5    Return in about 4 months (around 09/05/2021).  I have spent a total time of 35-minutes on the day of the appointment reviewing prior documentation, coordinating care and discussing medical diagnosis and plan with the patient/family. Past medical history, allergies, medications were reviewed. Pertinent imaging, labs and tests included in this note have been reviewed and interpreted independently by me.  [place], MD [facility] 05/06/2021 5:10 PM  Office Number [phone]

## 2021-05-06 NOTE — Progress Notes (Signed)
Full PFT performed today. °

## 2021-05-06 NOTE — Patient Instructions (Signed)
?  COVID-19 long hauler manifesting in dyspnea ?Obstructive lung disease  ?INCREASE Advair 115-21 mcg TWO puffs TWICE a day ? ?Dizziness, balance abnormality ?Scheduled with physical therapy ?Declined neurology referral at this time but would consider if PT ineffective ? ?Follow-up with me in 4 months ? ? ?

## 2021-05-06 NOTE — Patient Instructions (Signed)
Full PFT performed today. °

## 2021-05-08 DIAGNOSIS — Z961 Presence of intraocular lens: Secondary | ICD-10-CM | POA: Diagnosis not present

## 2021-05-08 DIAGNOSIS — H52203 Unspecified astigmatism, bilateral: Secondary | ICD-10-CM | POA: Diagnosis not present

## 2021-05-08 DIAGNOSIS — H04123 Dry eye syndrome of bilateral lacrimal glands: Secondary | ICD-10-CM | POA: Diagnosis not present

## 2021-05-08 DIAGNOSIS — H2511 Age-related nuclear cataract, right eye: Secondary | ICD-10-CM | POA: Diagnosis not present

## 2021-05-15 ENCOUNTER — Telehealth: Payer: Self-pay | Admitting: Pulmonary Disease

## 2021-05-15 DIAGNOSIS — J449 Chronic obstructive pulmonary disease, unspecified: Secondary | ICD-10-CM

## 2021-05-15 DIAGNOSIS — U099 Post covid-19 condition, unspecified: Secondary | ICD-10-CM

## 2021-05-15 MED ORDER — PREDNISONE 20 MG PO TABS: 40.0000 mg | ORAL_TABLET | Freq: Every day | ORAL | 0 refills | Status: AC

## 2021-05-15 MED ORDER — ALBUTEROL SULFATE (2.5 MG/3ML) 0.083% IN NEBU: 2.5000 mg | INHALATION_SOLUTION | Freq: Four times a day (QID) | RESPIRATORY_TRACT | 2 refills | Status: DC | PRN

## 2021-05-15 NOTE — Telephone Encounter (Signed)
Called patient and she states that she has had several episodes of some shortness of breath. She states that Dr Loanne Drilling increased her Advair to 2 puffs twice a day. She states that she feels like the medication is not working.  ? ?Dr Loanne Drilling please advise  ?

## 2021-05-15 NOTE — Telephone Encounter (Signed)
She reports worsening shortness of breath and cough.  ?SpO2 99% HR 72 ? ?Obstructive lung disease flare ?--CONTINUE Advair 115-21 mcg TWO puffs TWICE a day ?--START prednisone 40 mg x 5 days ?--ORDER albuterol neb meds ? ?Staff - please order nebulizer  ? ?

## 2021-05-16 NOTE — Telephone Encounter (Signed)
Order placed for neb machine.  ?Patient is aware and voiced her understanding.  ?Nothing further needed.  ? ?

## 2021-05-22 ENCOUNTER — Telehealth: Payer: Self-pay | Admitting: Pulmonary Disease

## 2021-05-22 DIAGNOSIS — R110 Nausea: Secondary | ICD-10-CM | POA: Diagnosis not present

## 2021-05-22 DIAGNOSIS — J449 Chronic obstructive pulmonary disease, unspecified: Secondary | ICD-10-CM | POA: Diagnosis not present

## 2021-05-22 DIAGNOSIS — R0602 Shortness of breath: Secondary | ICD-10-CM | POA: Diagnosis not present

## 2021-05-22 DIAGNOSIS — U071 COVID-19: Secondary | ICD-10-CM | POA: Diagnosis not present

## 2021-05-22 DIAGNOSIS — K760 Fatty (change of) liver, not elsewhere classified: Secondary | ICD-10-CM | POA: Diagnosis not present

## 2021-05-22 NOTE — Telephone Encounter (Signed)
Called and spoke to patient in regards to questions. Patient has picked up up nebulizer. Nothing further needed.  ?

## 2021-05-23 ENCOUNTER — Telehealth: Payer: Self-pay | Admitting: Pulmonary Disease

## 2021-05-23 DIAGNOSIS — R0609 Other forms of dyspnea: Secondary | ICD-10-CM

## 2021-05-23 DIAGNOSIS — R0602 Shortness of breath: Secondary | ICD-10-CM

## 2021-05-23 MED ORDER — ALBUTEROL SULFATE 0.63 MG/3ML IN NEBU: 1.0000 | INHALATION_SOLUTION | Freq: Four times a day (QID) | RESPIRATORY_TRACT | 12 refills | Status: DC | PRN

## 2021-05-23 NOTE — Telephone Encounter (Signed)
I called the patient to verify that she wanted the nebulizer solution and it was sent to [facility] and its on backorder. ? ?Per the pharmacy they do have 0.063% albuterol solution and ipratropium available.  Please advise on nebulizer solution. ?

## 2021-05-23 NOTE — Telephone Encounter (Signed)
Ok for Albuterol 0.063% nebulizer x 3 month supply. ?

## 2021-05-23 NOTE — Telephone Encounter (Signed)
I called the patient and she voices understanding. She was appreciative of the call. Nothing further needed.  ?

## 2021-05-28 DIAGNOSIS — E039 Hypothyroidism, unspecified: Secondary | ICD-10-CM | POA: Diagnosis not present

## 2021-05-28 DIAGNOSIS — I10 Essential (primary) hypertension: Secondary | ICD-10-CM | POA: Diagnosis not present

## 2021-05-28 DIAGNOSIS — K219 Gastro-esophageal reflux disease without esophagitis: Secondary | ICD-10-CM | POA: Diagnosis not present

## 2021-05-28 DIAGNOSIS — E7800 Pure hypercholesterolemia, unspecified: Secondary | ICD-10-CM | POA: Diagnosis not present

## 2021-07-08 ENCOUNTER — Encounter: Payer: Self-pay | Admitting: Pulmonary Disease

## 2021-07-08 ENCOUNTER — Telehealth: Payer: Self-pay | Admitting: Pulmonary Disease

## 2021-07-08 ENCOUNTER — Ambulatory Visit: Payer: Medicare Other | Admitting: Pulmonary Disease

## 2021-07-08 VITALS — BP 118/82 | HR 73 | Temp 98.1°F | Ht 60.0 in | Wt 188.0 lb

## 2021-07-08 DIAGNOSIS — J440 Chronic obstructive pulmonary disease with acute lower respiratory infection: Secondary | ICD-10-CM | POA: Diagnosis not present

## 2021-07-08 DIAGNOSIS — J209 Acute bronchitis, unspecified: Secondary | ICD-10-CM | POA: Diagnosis not present

## 2021-07-08 DIAGNOSIS — R0602 Shortness of breath: Secondary | ICD-10-CM

## 2021-07-08 MED ORDER — FLUTICASONE-SALMETEROL 230-21 MCG/ACT IN AERO: 2.0000 | INHALATION_SPRAY | Freq: Two times a day (BID) | RESPIRATORY_TRACT | 5 refills | Status: DC

## 2021-07-08 MED ORDER — DOXYCYCLINE HYCLATE 100 MG PO TABS: 100.0000 mg | ORAL_TABLET | Freq: Two times a day (BID) | ORAL | 0 refills | Status: DC

## 2021-07-08 MED ORDER — PREDNISONE 20 MG PO TABS: 40.0000 mg | ORAL_TABLET | Freq: Every day | ORAL | 0 refills | Status: DC

## 2021-07-08 NOTE — Telephone Encounter (Signed)
I called and spoke with the pt  ?She states having increased SOB since she woke up, and she noticed wheezing as well  ?She used neb and felt that this did not help  ?OV with AO today at 11:30, advised ED sooner if worsens ?

## 2021-07-08 NOTE — Patient Instructions (Addendum)
?  Prescription for doxycycline for 7 days ?Prednisone for 5 days ? ?Increase Advair to 230-2 puffs twice daily ? ?Call if no improvement in 2 to 3 days, can consider chest x-ray ? ?We will try and make sure you have an appointment scheduled with Dr. Loanne [name] ?

## 2021-07-08 NOTE — Telephone Encounter (Signed)
Patient called requesting to speak with a nurse- patient is having a hard time breathing, states she has had issues everyday, but woke up today and its worse, she cannot catch her breath. Done nebulizer treatment and it gave no relief. Call back number is [phone]. ?

## 2021-07-08 NOTE — Progress Notes (Signed)
? ? ?Subjective:  ? ?PATIENT ID: Becky [name] GENDER: female DOB: [birth year], MRN: [id number] ? ? ?HPI ? ?She is being seen today for an acute visit ? ?Reason for Visit:  ? ?Shortness of breath for the last 3 days ?Used albuterol this morning with persistence of symptoms despite albuterol use ? ?[name] is a 71 year old female with hx CVA, hx melanoma, HTN, CKD who presents with shortness of breath. ? ?She uses albuterol possibly every other day but has been using it daily since symptoms started ?Has been on Advair 110 2 puffs twice daily ? ?Initial Consult ?She was diagnosed with COVID-19 in July and treated with paxlovid. She had persistent symptoms for three weeks with cough, wheezing, shortness of breath, fatigue and insomnia. At this point she has persistent shortness of breath, wheezing with exertion and nonproductive cough. Denies prior history of asthma or frequent respiratory infections. She is albuterol twice a day. She has an prescription for Flovent but only taking if albuterol is not working. Uses it 2-3 times a week. She is walking more slowly due to fatigue and balance issues. She has to stop when walking up a flight stairs. She has to take breaks while vacuuming.  ? ?05/06/21 ?Since our last visit she was stepped up from Flovent to low-dose Advair. Able to tolerate but no significant difference. She has not been able to be active with aerobic activity but does have a new puppy that she is chasing up and down stairs. Planning to start taking her on walks when she is leash trained. Her activity is limited by nausea which she is scheduled to see GI.  ? ?Social History: ?Never smoker ?Childhood second hand smoke exposure ? ?Past Medical History:  ?Diagnosis Date  ? Chronic kidney disease   ? CVA (cerebral vascular accident) ([place]) 11/14/2013  ? Depression   ? followed by Dr Toy Care  ? Falls frequently 10/01/2015  ? Genital herpes   ? History of melanoma   ? followed by derm Dr Delman Cheadle  ?  Hyperlipidemia   ? Hypertension   ? MCI (mild cognitive impairment) 10/01/2015  ? Pure hypercholesterolemia 11/14/2013  ? Stroke [facility])   ? Old stroke seen on MRI 12/2009, carotid doppler w minimal plaque, MRI off the head otherwise neg for acute disease.  ? Thyroid disease   ? hypothyroid. Followed by Dr Toy Care  ? TIA (transient ischemic attack) 8/08  ? followed by neurologist Dr Leonie Man  ?  ? ?Family History  ?Problem Relation Age of Onset  ? Breast cancer Mother   ? Hyperlipidemia Father   ? Hypertension Father   ? Heart attack Father   ? Hypertension Sister   ? Hyperlipidemia Sister   ? Dementia Maternal Aunt   ?  ? ?Social History  ? ?Occupational History  ? Not on file  ?Tobacco Use  ? Smoking status: Never  ? Smokeless tobacco: Never  ?Vaping Use  ? Vaping Use: Never used  ?Substance and Sexual Activity  ? Alcohol use: No  ? Drug use: No  ? Sexual activity: Never  ? ? ?Allergies  ?Allergen Reactions  ? Penicillins Anaphylaxis  ? Iodinated Contrast Media Other (See Comments)  ? Iohexol Other (See Comments)  ?   Desc: iodine ?  ? Nsaids Other (See Comments)  ?  CKD ?  ?  ? ?Outpatient Medications Prior to Visit  ?Medication Sig Dispense Refill  ? acyclovir (ZOVIRAX) 200 MG capsule Take 200 mg by mouth 5 (  five) times daily as needed.     ? albuterol (ACCUNEB) 0.63 MG/3ML nebulizer solution Take 3 mLs (0.63 mg total) by nebulization every 6 (six) hours as needed for wheezing. 120 mL 12  ? albuterol (VENTOLIN HFA) 108 (90 Base) MCG/ACT inhaler as needed for wheezing.    ? ALPRAZolam (XANAX) 0.25 MG tablet 3 (three) times daily as needed.   1  ? amLODipine (NORVASC) 5 MG tablet Take 5 mg by mouth daily.     ? ARIPiprazole (ABILIFY) 5 MG tablet Take 5 mg by mouth daily.    ? aspirin EC 325 MG tablet Take 325 mg by mouth 2 (two) times daily.     ? desvenlafaxine (PRISTIQ) 100 MG 24 hr tablet Take 100 mg by mouth daily.    ? desvenlafaxine (PRISTIQ) 25 MG 24 hr tablet Take 25 mg by mouth daily. Take with '100mg'$  tab    ?  Dexmethylphenidate HCl 40 MG CP24 Take 40 mg daily by mouth.    ? L-Methylfolate-Algae (DEPLIN 15 PO) Take 1 tablet daily by mouth.    ? levothyroxine (SYNTHROID, LEVOTHROID) 25 MCG tablet Take 25 mcg by mouth daily before breakfast.    ? losartan-hydrochlorothiazide (HYZAAR) 100-25 MG per tablet Take 1 tablet by mouth daily.    ? montelukast (SINGULAIR) 10 MG tablet Take 1 tablet (10 mg total) by mouth at bedtime. 30 tablet 3  ? nadolol (CORGARD) 20 MG tablet Take 1 tablet (20 mg total) by mouth daily. 90 tablet 2  ? omeprazole (PRILOSEC) 40 MG capsule Take 40 mg by mouth every morning.    ? potassium chloride (KLOR-CON M) 10 MEQ tablet Take 10 mEq by mouth daily.    ? pramipexole (MIRAPEX) 0.125 MG tablet   11  ? rosuvastatin (CRESTOR) 10 MG tablet Take 10 mg by mouth daily.    ? telmisartan (MICARDIS) 80 MG tablet Take 80 mg by mouth daily.    ? traZODone (DESYREL) 50 MG tablet Take 50 mg by mouth at bedtime as needed for sleep.    ? fluticasone-salmeterol (ADVAIR HFA) 115-21 MCG/ACT inhaler Inhale 2 puffs into the lungs 2 (two) times daily. 1 each 5  ? ?No facility-administered medications prior to visit.  ? ? ?Review of Systems  ?Constitutional:  Positive for malaise/fatigue. Negative for chills, diaphoresis, fever and weight loss.  ?HENT:  Negative for congestion.   ?Respiratory:  Positive for shortness of breath. Negative for cough, hemoptysis, sputum production and wheezing.   ?Cardiovascular:  Negative for chest pain, palpitations and leg swelling.  ?Gastrointestinal:  Positive for nausea.  ?Neurological:  Positive for dizziness.  ? ? ?Objective:  ? ?Vitals:  ? 07/08/21 1142  ?BP: 118/82  ?Pulse: 73  ?Temp: 98.1 ?F (36.7 ?C)  ?TempSrc: Oral  ?SpO2: 96%  ?Weight: 188 lb (85.3 kg)  ?Height: 5' (1.524 m)  ? ?SpO2: 96 % ? ?Physical Exam: ?General: Appears tired ?HENT: Moist oral mucosa ?Eyes: EOMI, no scleral icterus ?Respiratory: Clear breath sounds bilaterally  ?cardiovascular: S1-S2 appreciated ?Extremities:  No clubbing, no edema ?Neuro: Alert and oriented ?Psych: Normal mood, normal affect ? ?Data Reviewed: ? ?Imaging: ?CXR 03/15/21 - Lingular scarring. No infiltrate, effusion or edema ? ?PFT: ?05/06/21 ?FVC 1.88 (74%) FEV1 1.5 (79%) Ratio 81  TLC 127% DLCO 89% ?Interpretation: No obstructive defect on spirometry however air trapping present and F-V curves suggestive of obstructive defect/small airway disease. Normal gas exchange ? ?Labs: ?CBC ?   ?Component Value Date/Time  ? WBC 9.5 03/15/2021 1329  ?  RBC 4.53 03/15/2021 1329  ? HGB 14.1 03/15/2021 1329  ? HGB 13.0 06/30/2013 1300  ? HCT 40.9 03/15/2021 1329  ? HCT 38.7 06/30/2013 1300  ? PLT 305 03/15/2021 1329  ? PLT 341 06/30/2013 1300  ? MCV 90.3 03/15/2021 1329  ? MCV 89 06/30/2013 1300  ? MCH 31.1 03/15/2021 1329  ? MCHC 34.5 03/15/2021 1329  ? RDW 13.3 03/15/2021 1329  ? RDW 13.4 06/30/2013 1300  ? LYMPHSABS 2.4 09/24/2020 1812  ? LYMPHSABS 2.7 06/30/2013 1300  ? MONOABS 1.4 (H) 09/24/2020 1812  ? MONOABS 1.1 (H) 06/30/2013 1300  ? EOSABS 0.4 09/24/2020 1812  ? EOSABS 0.2 06/30/2013 1300  ? BASOSABS 0.0 09/24/2020 1812  ? BASOSABS 0.1 06/30/2013 1300  ? ?   ?Assessment & Plan:  ? ?Discussion: ? ?Shortness of breath ?Obstructive lung disease ?-Acute bronchitis ? ?Past history of COVID 17 with long hauler symptoms ? ?Dizziness ? ?Chronic fatigue ? ?With acute symptoms ?-We will call in a prescription for doxycycline 100 twice daily for 7 days ?-Prednisone 40 daily for 7 days ?-Increase Advair to 230, 2 puffs twice a day ? ?We will try and make sure she has an appointment to follow-up with Dr. Loanne Drilling ? ?With regards to deconditioning ?-Encouraged to start getting more active as tolerated has she stands a risk of losing significant muscle mass as well that may continue to make her more short of breath ? ? ?Health Maintenance ?Immunization History  ?Administered Date(s) Administered  ? Influenza, High Dose Seasonal PF 11/05/2016, 12/09/2017, 12/16/2018, 12/29/2019  ?  Influenza,inj,quad, With Preservative 10/19/2014, 10/22/2015  ? PFIZER(Purple Top)SARS-COV-2 Vaccination 05/14/2019, 06/04/2019  ? Pneumococcal Conjugate-13 11/05/2016  ? Pneumococcal Polysaccharide-23 12/25/2017  ?

## 2021-07-27 ENCOUNTER — Telehealth: Payer: Self-pay | Admitting: Pulmonary Disease

## 2021-07-27 MED ORDER — PREDNISONE 10 MG PO TABS: 40.0000 mg | ORAL_TABLET | Freq: Every day | ORAL | 0 refills | Status: AC

## 2021-07-27 NOTE — Telephone Encounter (Signed)
Patient called to say her breathing is worse with increased dyspnea, wheezing She was seen in office earlier this month for similar complaints and was give doxy and prednisone for 7 days with improvement  I don't think she needs additional antibiotics as there is no cough or mucus production, fevers Prescribe additional prednisone 40 mg a day for 7 days She will call back to office if symptoms do not improve follow-up chest x-ray

## 2021-08-12 ENCOUNTER — Ambulatory Visit: Payer: Medicare Other | Admitting: Pulmonary Disease

## 2021-08-12 ENCOUNTER — Other Ambulatory Visit: Payer: Self-pay

## 2021-08-12 ENCOUNTER — Telehealth: Payer: Self-pay | Admitting: Pulmonary Disease

## 2021-08-12 ENCOUNTER — Encounter: Payer: Self-pay | Admitting: Pulmonary Disease

## 2021-08-12 ENCOUNTER — Other Ambulatory Visit (INDEPENDENT_AMBULATORY_CARE_PROVIDER_SITE_OTHER): Payer: Medicare Other

## 2021-08-12 VITALS — BP 128/74 | HR 78 | Temp 98.2°F | Ht 60.0 in | Wt 202.2 lb

## 2021-08-12 DIAGNOSIS — R0609 Other forms of dyspnea: Secondary | ICD-10-CM

## 2021-08-12 DIAGNOSIS — U099 Post covid-19 condition, unspecified: Secondary | ICD-10-CM

## 2021-08-12 DIAGNOSIS — J4551 Severe persistent asthma with (acute) exacerbation: Secondary | ICD-10-CM

## 2021-08-12 LAB — CBC WITH DIFFERENTIAL/PLATELET
Basophils Absolute: 0.1 10*3/uL (ref 0.0–0.1)
Basophils Relative: 1.2 % (ref 0.0–3.0)
Eosinophils Absolute: 0.3 10*3/uL (ref 0.0–0.7)
Eosinophils Relative: 2.7 % (ref 0.0–5.0)
HCT: 35.1 % — ABNORMAL LOW (ref 36.0–46.0)
Hemoglobin: 12.1 g/dL (ref 12.0–15.0)
Lymphocytes Relative: 18.5 % (ref 12.0–46.0)
Lymphs Abs: 2.2 10*3/uL (ref 0.7–4.0)
MCHC: 34.3 g/dL (ref 30.0–36.0)
MCV: 91.7 fl (ref 78.0–100.0)
Monocytes Absolute: 1.3 10*3/uL — ABNORMAL HIGH (ref 0.1–1.0)
Monocytes Relative: 11.4 % (ref 3.0–12.0)
Neutro Abs: 7.8 10*3/uL — ABNORMAL HIGH (ref 1.4–7.7)
Neutrophils Relative %: 66.2 % (ref 43.0–77.0)
Platelets: 246 10*3/uL (ref 150.0–400.0)
RBC: 3.83 Mil/uL — ABNORMAL LOW (ref 3.87–5.11)
RDW: 14 % (ref 11.5–15.5)
WBC: 11.7 10*3/uL — ABNORMAL HIGH (ref 4.0–10.5)

## 2021-08-12 MED ORDER — PREDNISONE 10 MG PO TABS: ORAL_TABLET | ORAL | 0 refills | Status: DC

## 2021-08-12 MED ORDER — FLUTICASONE-SALMETEROL 500-50 MCG/ACT IN AEPB
1.0000 | INHALATION_SPRAY | Freq: Two times a day (BID) | RESPIRATORY_TRACT | 5 refills | Status: DC
Start: 2021-08-12 — End: 2021-09-11

## 2021-08-12 NOTE — Patient Instructions (Addendum)
COVID-19 long hauler manifesting in dyspnea Moderate persistent asthma START prednisone STOP Advair 230-21 mcg  START Advair Diskus 500-50 ONE puff TWICE a day CONTINUE Albuterol or nebulizer AS NEEDED ORDER labs: CBC with diff and IgE START enrollment for Dupixent. Will determine cost prior to starting  Reflux CONTINUE omeprazole daily  Follow-up with me in 1 months

## 2021-08-12 NOTE — Telephone Encounter (Signed)
Orders for labs have been placed per AVS from today. Called and let Becky [name] know and she expressed understanding.   Instructions  COVID-19 long hauler manifesting in dyspnea Moderate persistent asthma START prednisone STOP Advair 230-21 mcg  START Advair Diskus 500-50 ONE puff TWICE a day CONTINUE Albuterol or nebulizer AS NEEDED ORDER labs: CBC with diff and IgE START enrollment for Dupixent. Will determine cost prior to starting   Reflux CONTINUE omeprazole daily   Follow-up with me in 1 months

## 2021-08-12 NOTE — Telephone Encounter (Signed)
Orders for labs have been placed per AVS from today. Called and let Felicia know and she expressed understanding.   Instructions  COVID-19 long hauler manifesting in dyspnea Moderate persistent asthma START prednisone STOP Advair 230-21 mcg  START Advair Diskus 500-50 ONE puff TWICE a day CONTINUE Albuterol or nebulizer AS NEEDED ORDER labs: CBC with diff and IgE START enrollment for Dupixent. Will determine cost prior to starting   Reflux CONTINUE omeprazole daily   Follow-up with me in 1 months

## 2021-08-12 NOTE — Progress Notes (Signed)
Subjective:   PATIENT ID: Becky [name] GENDER: female DOB: 1950-09-07, MRN: [id number]   HPI  Chief Complaint  Patient presents with   Acute Visit    SOB/ cough/ wheezing all weekend     Reason for Visit: Follow-up   [name] is a 71 year old female with hx CVA, hx melanoma, HTN, CKD who presents with shortness of breath.  Initial Consult She was diagnosed with COVID-19 in July and treated with paxlovid. She had persistent symptoms for three weeks with cough, wheezing, shortness of breath, fatigue and insomnia. At this point she has persistent shortness of breath, wheezing with exertion and nonproductive cough. Denies prior history of asthma or frequent respiratory infections. She is albuterol twice a day. She has an prescription for Flovent but only taking if albuterol is not working. Uses it 2-3 times a week. She is walking more slowly due to fatigue and balance issues. She has to stop when walking up a flight stairs. She has to take breaks while vacuuming.   05/06/21 Since our last visit she was stepped up from Flovent to low-dose Advair. Able to tolerate but no significant difference. She has not been able to be active with aerobic activity but does have a new puppy that she is chasing up and down stairs. Planning to start taking her on walks when she is leash trained. Her activity is limited by nausea which she is scheduled to see GI.   08/12/21 Family present and provides additional history. Since our last visit she has been treated with prednisone in March, prednisone and doxycycline in early May, and prednisone taper in late May for shortness of breath and wheezing. With each steroid taper she has near resolution of symptoms and able to perform regular activities. Has shortness of breath and wheezing with exertion. Feels fatigued from her multiple flares.  Social History: Never smoker Childhood second hand smoke exposure  Past Medical History:  Diagnosis Date    Chronic kidney disease    CVA (cerebral vascular accident) ([place]) 11/14/2013   Depression    followed by Dr Waverly Ferrari frequently 10/01/2015   Genital herpes    History of melanoma    followed by derm Dr Delman Cheadle   Hyperlipidemia    Hypertension    MCI (mild cognitive impairment) 10/01/2015   Pure hypercholesterolemia 11/14/2013   Stroke [facility])    Old stroke seen on MRI 12/2009, carotid doppler w minimal plaque, MRI off the head otherwise neg for acute disease.   Thyroid disease    hypothyroid. Followed by Dr Toy Care   TIA (transient ischemic attack) 8/08   followed by neurologist Dr Leonie Man     Family History  Problem Relation Age of Onset   Breast cancer Mother    Hyperlipidemia Father    Hypertension Father    Heart attack Father    Hypertension Sister    Hyperlipidemia Sister    Dementia Maternal Aunt      Social History   Occupational History   Not on file  Tobacco Use   Smoking status: Never   Smokeless tobacco: Never  Vaping Use   Vaping Use: Never used  Substance and Sexual Activity   Alcohol use: No   Drug use: No   Sexual activity: Never    Allergies  Allergen Reactions   Penicillins Anaphylaxis   Iodinated Contrast Media Other (See Comments)   Iohexol Other (See Comments)     Desc: iodine    Nsaids  Other (See Comments)    CKD      Outpatient Medications Prior to Visit  Medication Sig Dispense Refill   acyclovir (ZOVIRAX) 200 MG capsule Take 200 mg by mouth 5 (five) times daily as needed.      albuterol (ACCUNEB) 0.63 MG/3ML nebulizer solution Take 3 mLs (0.63 mg total) by nebulization every 6 (six) hours as needed for wheezing. 120 mL 12   albuterol (VENTOLIN HFA) 108 (90 Base) MCG/ACT inhaler as needed for wheezing.     ALPRAZolam (XANAX) 0.25 MG tablet 3 (three) times daily as needed.   1   amLODipine (NORVASC) 5 MG tablet Take 5 mg by mouth daily.      ARIPiprazole (ABILIFY) 5 MG tablet Take 5 mg by mouth daily.     aspirin EC 325 MG tablet Take 325  mg by mouth 2 (two) times daily.      desvenlafaxine (PRISTIQ) 100 MG 24 hr tablet Take 100 mg by mouth daily.     desvenlafaxine (PRISTIQ) 25 MG 24 hr tablet Take 25 mg by mouth daily. Take with '100mg'$  tab     Dexmethylphenidate HCl 40 MG CP24 Take 40 mg daily by mouth.     doxycycline (VIBRA-TABS) 100 MG tablet Take 1 tablet (100 mg total) by mouth 2 (two) times daily. 14 tablet 0   fluticasone-salmeterol (ADVAIR HFA) 230-21 MCG/ACT inhaler Inhale 2 puffs into the lungs 2 (two) times daily. 1 each 5   L-Methylfolate-Algae (DEPLIN 15 PO) Take 1 tablet daily by mouth.     levothyroxine (SYNTHROID, LEVOTHROID) 25 MCG tablet Take 25 mcg by mouth daily before breakfast.     losartan-hydrochlorothiazide (HYZAAR) 100-25 MG per tablet Take 1 tablet by mouth daily.     montelukast (SINGULAIR) 10 MG tablet Take 1 tablet (10 mg total) by mouth at bedtime. 30 tablet 3   nadolol (CORGARD) 20 MG tablet Take 1 tablet (20 mg total) by mouth daily. 90 tablet 2   omeprazole (PRILOSEC) 40 MG capsule Take 40 mg by mouth every morning.     potassium chloride (KLOR-CON M) 10 MEQ tablet Take 10 mEq by mouth daily.     pramipexole (MIRAPEX) 0.125 MG tablet   11   predniSONE (DELTASONE) 20 MG tablet Take 2 tablets (40 mg total) by mouth daily with breakfast. 14 tablet 0   rosuvastatin (CRESTOR) 10 MG tablet Take 10 mg by mouth daily.     telmisartan (MICARDIS) 80 MG tablet Take 80 mg by mouth daily.     traZODone (DESYREL) 50 MG tablet Take 50 mg by mouth at bedtime as needed for sleep.     No facility-administered medications prior to visit.    Review of Systems  Constitutional:  Positive for malaise/fatigue. Negative for chills, diaphoresis, fever and weight loss.  HENT:  Negative for congestion.   Respiratory:  Positive for shortness of breath. Negative for cough, hemoptysis, sputum production and wheezing.   Cardiovascular:  Negative for chest pain, palpitations and leg swelling.     Objective:   Vitals:    08/12/21 1334  BP: 128/74  Pulse: 78  Temp: 98.2 F (36.8 C)  TempSrc: Oral  SpO2: 95%  Weight: 202 lb 3.2 oz (91.7 kg)  Height: 5' (1.524 m)   SpO2: 95 % O2 Device: None (Room air)  Physical Exam: General: Well-appearing, no acute distress HENT: [place], AT Eyes: EOMI, no scleral icterus Respiratory: Diminished breath sounds bilateral with expiratory wheeze. Cardiovascular: RRR, -M/R/G, no JVD Extremities:-Edema,-tenderness Neuro: AAO x4,  CNII-XII grossly intact Psych: Normal mood, normal affect   Data Reviewed:  Imaging: CXR 03/15/21 - Lingular scarring. No infiltrate, effusion or edema  PFT: 05/06/21 FVC 1.88 (74%) FEV1 1.5 (79%) Ratio 81  TLC 127% DLCO 89% Interpretation: No obstructive defect on spirometry however air trapping present and F-V curves suggestive of obstructive defect/small airway disease. Normal gas exchange  Labs: CBC    Component Value Date/Time   WBC 9.5 03/15/2021 1329   RBC 4.53 03/15/2021 1329   HGB 14.1 03/15/2021 1329   HGB 13.0 06/30/2013 1300   HCT 40.9 03/15/2021 1329   HCT 38.7 06/30/2013 1300   PLT 305 03/15/2021 1329   PLT 341 06/30/2013 1300   MCV 90.3 03/15/2021 1329   MCV 89 06/30/2013 1300   MCH 31.1 03/15/2021 1329   MCHC 34.5 03/15/2021 1329   RDW 13.3 03/15/2021 1329   RDW 13.4 06/30/2013 1300   LYMPHSABS 2.4 09/24/2020 1812   LYMPHSABS 2.7 06/30/2013 1300   MONOABS 1.4 (H) 09/24/2020 1812   MONOABS 1.1 (H) 06/30/2013 1300   EOSABS 0.4 09/24/2020 1812   EOSABS 0.2 06/30/2013 1300   BASOSABS 0.0 09/24/2020 1812   BASOSABS 0.1 06/30/2013 1300      Assessment & Plan:   Discussion: 71 year old with hx COVID 08/2020, hx CVA, hx melanoma, HTN, CKD who presents for follow-up. Has had partial benefit with ICS inhaler with near resolution on prednisone taper. Will treat for flare again and titrate ICS inhaler up. Counseled on inhaler use. Her symptoms are consistent with asthma. With her prior peripheral eosinophilia, will  enroll in biologic. Discussed risks and benefits of biologic and will plan to enroll in [place].  COVID-19 long hauler manifesting in dyspnea Moderate persistent asthma START prednisone STOP Advair 230-21 mcg  START Advair Diskus 500-50 ONE puff TWICE a day CONTINUE Albuterol or nebulizer AS NEEDED ORDER labs: CBC with diff and IgE START enrollment for Dupixent. Will determine cost prior to starting  Health Maintenance Immunization History  Administered Date(s) Administered   Influenza Split 12/30/2020   Influenza, High Dose Seasonal PF 11/05/2016, 12/09/2017, 12/16/2018, 12/29/2019   Influenza,inj,quad, With Preservative 10/19/2014, 10/22/2015   Moderna Sars-Covid-2 Vaccination 02/10/2020   PFIZER(Purple Top)SARS-COV-2 Vaccination 05/14/2019, 06/04/2019   Pneumococcal Conjugate-13 11/05/2016   Pneumococcal Polysaccharide-23 12/25/2017   Td 10/19/2014   Zoster Recombinat (Shingrix) 12/01/2017, 11/19/2018   Zoster, Live 12/01/2017, 11/19/2018   CT Lung Screen - not qualified. Never smoker  Orders Placed This Encounter  Procedures   CBC with Differential/Platelet    Standing Status:   Future    Number of Occurrences:   1    Standing Expiration Date:   08/13/2022   IgE    Standing Status:   Future    Number of Occurrences:   1    Standing Expiration Date:   08/13/2022   Ambulatory referral to ENT    Referral Priority:   Routine    Referral Type:   Consultation    Referral Reason:   Specialty Services Required    Requested Specialty:   Otolaryngology    Number of Visits Requested:   1   Meds ordered this encounter  Medications   fluticasone-salmeterol (ADVAIR DISKUS) 500-50 MCG/ACT AEPB    Sig: Inhale 1 puff into the lungs in the morning and at bedtime.    Dispense:  60 each    Refill:  5   predniSONE (DELTASONE) 10 MG tablet    Sig: Take 4 tablets (40 mg total) by  mouth daily with breakfast for 2 days, THEN 3 tablets (30 mg total) daily with breakfast for 2 days, THEN 2  tablets (20 mg total) daily with breakfast for 2 days, THEN 1 tablet (10 mg total) daily with breakfast for 2 days.    Dispense:  20 tablet    Refill:  0   predniSONE (DELTASONE) 10 MG tablet    Sig: Take 4 tablets (40 mg total) by mouth daily with breakfast for 2 days, THEN 3 tablets (30 mg total) daily with breakfast for 2 days, THEN 2 tablets (20 mg total) daily with breakfast for 2 days, THEN 1 tablet (10 mg total) daily with breakfast for 2 days.    Dispense:  20 tablet    Refill:  0    Return in about 1 month (around 09/11/2021).  I have spent a total time of 30-minutes on the day of the appointment including chart review, data review, collecting history, coordinating care and discussing medical diagnosis and plan with the patient/family. Past medical history, allergies, medications were reviewed. Pertinent imaging, labs and tests included in this note have been reviewed and interpreted independently by me.  [place], MD [facility] 08/12/2021 1:38 PM  Office Number [phone]

## 2021-08-13 LAB — IGE: IgE (Immunoglobulin E), Serum: 131 kU/L — ABNORMAL HIGH (ref <=114)

## 2021-08-14 ENCOUNTER — Telehealth: Payer: Self-pay | Admitting: Pulmonary Disease

## 2021-08-14 ENCOUNTER — Telehealth: Payer: Self-pay | Admitting: Pharmacist

## 2021-08-14 NOTE — Telephone Encounter (Signed)
Please start [place].  Dose: '600mg'$  SQ at Week 0 then '300mg'$  SQ every 2 weeks thereafter  Dx: Severe persistent asthma (J45.5)  Dupixent MyWay enrollent form placed in PAP pending info folder   Knox Saliva, PharmD, MPH, BCPS, CPP Clinical Pharmacist (Rheumatology and Pulmonology)

## 2021-08-15 MED ORDER — OMEPRAZOLE 40 MG PO CPDR: 40.0000 mg | DELAYED_RELEASE_CAPSULE | Freq: Every morning | ORAL | 3 refills | Status: DC

## 2021-08-15 NOTE — Telephone Encounter (Signed)
Called patient to verify pharmacy with her for her omeprazole '40mg'$ . Patient verbalized pharmacy. Nothing further needed. Refills sent

## 2021-08-16 ENCOUNTER — Other Ambulatory Visit (HOSPITAL_COMMUNITY): Payer: Self-pay

## 2021-08-16 NOTE — Telephone Encounter (Signed)
Received notification from [facility] regarding a prior authorization for [place]. Authorization has been APPROVED from 08/16/21 to 03/02/22.   Per test claim, copay for 28 days supply loading dose is $1635.26.  Patient can fill through [place]: [phone]   Authorization # [id number]  Will have to pursue patient assistance. Patient will stop by next week to sign form. Pharmacy will leave application in front file cabinet. Provider portion will be placed in Dr. Cordelia Pen mailbox.  Knox Saliva, PharmD, MPH, BCPS, CPP Clinical Pharmacist (Rheumatology and Pulmonology)

## 2021-08-16 NOTE — Telephone Encounter (Signed)
Submitted a Prior Authorization request to [facility] for [place] via CoverMyMeds. Will update once we receive a response.  Key: [id number]  Knox Saliva, PharmD, MPH, BCPS, CPP Clinical Pharmacist (Rheumatology and Pulmonology)

## 2021-08-17 ENCOUNTER — Telehealth: Payer: Self-pay | Admitting: Internal Medicine

## 2021-08-17 ENCOUNTER — Encounter (HOSPITAL_COMMUNITY): Payer: Self-pay | Admitting: Emergency Medicine

## 2021-08-17 ENCOUNTER — Inpatient Hospital Stay (HOSPITAL_COMMUNITY): Payer: Medicare Other

## 2021-08-17 ENCOUNTER — Other Ambulatory Visit: Payer: Self-pay

## 2021-08-17 ENCOUNTER — Emergency Department (HOSPITAL_COMMUNITY): Payer: Medicare Other

## 2021-08-17 ENCOUNTER — Inpatient Hospital Stay (HOSPITAL_COMMUNITY)
Admission: EM | Admit: 2021-08-17 | Discharge: 2021-08-20 | DRG: 203 | Disposition: A | Discharge status: Home Health | Payer: Medicare Other | Attending: Internal Medicine | Admitting: Internal Medicine

## 2021-08-17 DIAGNOSIS — Z90710 Acquired absence of both cervix and uterus: Secondary | ICD-10-CM

## 2021-08-17 DIAGNOSIS — E039 Hypothyroidism, unspecified: Secondary | ICD-10-CM | POA: Diagnosis not present

## 2021-08-17 DIAGNOSIS — R064 Hyperventilation: Secondary | ICD-10-CM | POA: Diagnosis present

## 2021-08-17 DIAGNOSIS — F419 Anxiety disorder, unspecified: Secondary | ICD-10-CM | POA: Diagnosis present

## 2021-08-17 DIAGNOSIS — J4541 Moderate persistent asthma with (acute) exacerbation: Principal | ICD-10-CM | POA: Diagnosis present

## 2021-08-17 DIAGNOSIS — Z886 Allergy status to analgesic agent status: Secondary | ICD-10-CM | POA: Diagnosis not present

## 2021-08-17 DIAGNOSIS — Z85820 Personal history of malignant melanoma of skin: Secondary | ICD-10-CM

## 2021-08-17 DIAGNOSIS — E079 Disorder of thyroid, unspecified: Secondary | ICD-10-CM | POA: Diagnosis present

## 2021-08-17 DIAGNOSIS — F32A Depression, unspecified: Secondary | ICD-10-CM | POA: Diagnosis present

## 2021-08-17 DIAGNOSIS — Z9049 Acquired absence of other specified parts of digestive tract: Secondary | ICD-10-CM

## 2021-08-17 DIAGNOSIS — Z8349 Family history of other endocrine, nutritional and metabolic diseases: Secondary | ICD-10-CM

## 2021-08-17 DIAGNOSIS — R296 Repeated falls: Secondary | ICD-10-CM | POA: Diagnosis present

## 2021-08-17 DIAGNOSIS — R0609 Other forms of dyspnea: Secondary | ICD-10-CM | POA: Diagnosis not present

## 2021-08-17 DIAGNOSIS — Z7951 Long term (current) use of inhaled steroids: Secondary | ICD-10-CM

## 2021-08-17 DIAGNOSIS — K219 Gastro-esophageal reflux disease without esophagitis: Secondary | ICD-10-CM | POA: Diagnosis not present

## 2021-08-17 DIAGNOSIS — I10 Essential (primary) hypertension: Secondary | ICD-10-CM | POA: Diagnosis present

## 2021-08-17 DIAGNOSIS — J449 Chronic obstructive pulmonary disease, unspecified: Secondary | ICD-10-CM | POA: Diagnosis present

## 2021-08-17 DIAGNOSIS — D72829 Elevated white blood cell count, unspecified: Secondary | ICD-10-CM | POA: Diagnosis not present

## 2021-08-17 DIAGNOSIS — Z880 Allergy status to penicillin: Secondary | ICD-10-CM

## 2021-08-17 DIAGNOSIS — Z8673 Personal history of transient ischemic attack (TIA), and cerebral infarction without residual deficits: Secondary | ICD-10-CM | POA: Diagnosis not present

## 2021-08-17 DIAGNOSIS — Z8249 Family history of ischemic heart disease and other diseases of the circulatory system: Secondary | ICD-10-CM

## 2021-08-17 DIAGNOSIS — Z79899 Other long term (current) drug therapy: Secondary | ICD-10-CM

## 2021-08-17 DIAGNOSIS — G3184 Mild cognitive impairment, so stated: Secondary | ICD-10-CM | POA: Diagnosis present

## 2021-08-17 DIAGNOSIS — R0603 Acute respiratory distress: Secondary | ICD-10-CM | POA: Diagnosis not present

## 2021-08-17 DIAGNOSIS — U099 Post covid-19 condition, unspecified: Secondary | ICD-10-CM | POA: Diagnosis present

## 2021-08-17 DIAGNOSIS — Z6839 Body mass index (BMI) 39.0-39.9, adult: Secondary | ICD-10-CM

## 2021-08-17 DIAGNOSIS — R062 Wheezing: Secondary | ICD-10-CM

## 2021-08-17 DIAGNOSIS — E876 Hypokalemia: Secondary | ICD-10-CM | POA: Diagnosis present

## 2021-08-17 DIAGNOSIS — E785 Hyperlipidemia, unspecified: Secondary | ICD-10-CM | POA: Diagnosis present

## 2021-08-17 DIAGNOSIS — E7800 Pure hypercholesterolemia, unspecified: Secondary | ICD-10-CM | POA: Diagnosis not present

## 2021-08-17 DIAGNOSIS — Z79890 Hormone replacement therapy (postmenopausal): Secondary | ICD-10-CM

## 2021-08-17 DIAGNOSIS — Z7982 Long term (current) use of aspirin: Secondary | ICD-10-CM | POA: Diagnosis not present

## 2021-08-17 DIAGNOSIS — R0600 Dyspnea, unspecified: Secondary | ICD-10-CM | POA: Diagnosis not present

## 2021-08-17 DIAGNOSIS — R0602 Shortness of breath: Secondary | ICD-10-CM | POA: Diagnosis not present

## 2021-08-17 DIAGNOSIS — J4540 Moderate persistent asthma, uncomplicated: Secondary | ICD-10-CM | POA: Diagnosis present

## 2021-08-17 DIAGNOSIS — I129 Hypertensive chronic kidney disease with stage 1 through stage 4 chronic kidney disease, or unspecified chronic kidney disease: Secondary | ICD-10-CM | POA: Diagnosis present

## 2021-08-17 DIAGNOSIS — N183 Chronic kidney disease, stage 3 unspecified: Secondary | ICD-10-CM | POA: Diagnosis present

## 2021-08-17 DIAGNOSIS — Z91041 Radiographic dye allergy status: Secondary | ICD-10-CM

## 2021-08-17 DIAGNOSIS — Z803 Family history of malignant neoplasm of breast: Secondary | ICD-10-CM

## 2021-08-17 DIAGNOSIS — N1831 Chronic kidney disease, stage 3a: Secondary | ICD-10-CM | POA: Diagnosis present

## 2021-08-17 DIAGNOSIS — N189 Chronic kidney disease, unspecified: Secondary | ICD-10-CM | POA: Diagnosis present

## 2021-08-17 LAB — BLOOD GAS, ARTERIAL
Acid-Base Excess: 3.8 mmol/L — ABNORMAL HIGH (ref 0.0–2.0)
Bicarbonate: 25.7 mmol/L (ref 20.0–28.0)
O2 Saturation: 98.3 %
Patient temperature: 37
pCO2 arterial: 30 mmHg — ABNORMAL LOW (ref 32–48)
pH, Arterial: 7.54 — ABNORMAL HIGH (ref 7.35–7.45)
pO2, Arterial: 89 mmHg (ref 83–108)

## 2021-08-17 LAB — CBC WITH DIFFERENTIAL/PLATELET
Abs Immature Granulocytes: 0.25 10*3/uL — ABNORMAL HIGH (ref 0.00–0.07)
Basophils Absolute: 0 10*3/uL (ref 0.0–0.1)
Basophils Relative: 0 %
Eosinophils Absolute: 0 10*3/uL (ref 0.0–0.5)
Eosinophils Relative: 0 %
HCT: 36.4 % (ref 36.0–46.0)
Hemoglobin: 12.3 g/dL (ref 12.0–15.0)
Immature Granulocytes: 2 %
Lymphocytes Relative: 9 %
Lymphs Abs: 1.2 10*3/uL (ref 0.7–4.0)
MCH: 31.8 pg (ref 26.0–34.0)
MCHC: 33.8 g/dL (ref 30.0–36.0)
MCV: 94.1 fL (ref 80.0–100.0)
Monocytes Absolute: 0.6 10*3/uL (ref 0.1–1.0)
Monocytes Relative: 5 %
Neutro Abs: 11 10*3/uL — ABNORMAL HIGH (ref 1.7–7.7)
Neutrophils Relative %: 84 %
Platelets: 279 10*3/uL (ref 150–400)
RBC: 3.87 MIL/uL (ref 3.87–5.11)
RDW: 13.6 % (ref 11.5–15.5)
WBC: 13.1 10*3/uL — ABNORMAL HIGH (ref 4.0–10.5)
nRBC: 0 % (ref 0.0–0.2)

## 2021-08-17 LAB — COMPREHENSIVE METABOLIC PANEL
ALT: 27 U/L (ref 0–44)
AST: 19 U/L (ref 15–41)
Albumin: 4 g/dL (ref 3.5–5.0)
Alkaline Phosphatase: 62 U/L (ref 38–126)
Anion gap: 8 (ref 5–15)
BUN: 19 mg/dL (ref 8–23)
CO2: 30 mmol/L (ref 22–32)
Calcium: 9.3 mg/dL (ref 8.9–10.3)
Chloride: 102 mmol/L (ref 98–111)
Creatinine, Ser: 0.98 mg/dL (ref 0.44–1.00)
GFR, Estimated: >60 mL/min (ref >60)
Glucose, Bld: 134 mg/dL — ABNORMAL HIGH (ref 70–99)
Potassium: 3.5 mmol/L (ref 3.5–5.1)
Sodium: 140 mmol/L (ref 135–145)
Total Bilirubin: 0.5 mg/dL (ref 0.3–1.2)
Total Protein: 6.8 g/dL (ref 6.5–8.1)

## 2021-08-17 LAB — BRAIN NATRIURETIC PEPTIDE: B Natriuretic Peptide: 111.8 pg/mL — ABNORMAL HIGH (ref 0.0–100.0)

## 2021-08-17 LAB — TROPONIN I (HIGH SENSITIVITY)
Troponin I (High Sensitivity): 5 ng/L (ref <18)
Troponin I (High Sensitivity): 5 ng/L (ref <18)

## 2021-08-17 LAB — LACTIC ACID, PLASMA
Lactic Acid, Venous: 1.7 mmol/L (ref 0.5–1.9)
Lactic Acid, Venous: 1.4 mmol/L (ref 0.5–1.9)

## 2021-08-17 LAB — LIPASE, BLOOD: Lipase: 24 U/L (ref 11–51)

## 2021-08-17 LAB — D-DIMER, QUANTITATIVE: D-Dimer, Quant: 0.31 ug/mL-FEU (ref 0.00–0.50)

## 2021-08-17 MED ORDER — STERILE WATER FOR INJECTION IJ SOLN
INTRAMUSCULAR | Status: AC
  Administered 2021-08-17: 2 mL via INTRAVENOUS
  Filled 2021-08-17: qty 10

## 2021-08-17 MED ORDER — IPRATROPIUM-ALBUTEROL 0.5-2.5 (3) MG/3ML IN SOLN
3.0000 mL | Freq: Once | RESPIRATORY_TRACT | Status: AC
  Administered 2021-08-17: 3 mL via RESPIRATORY_TRACT
  Filled 2021-08-17: qty 3

## 2021-08-17 MED ORDER — METHYLPREDNISOLONE SODIUM SUCC 125 MG IJ SOLR
125.0000 mg | Freq: Once | INTRAMUSCULAR | Status: AC
Start: 2021-08-17 — End: 2021-08-17
  Administered 2021-08-17: 125 mg via INTRAVENOUS
  Filled 2021-08-17: qty 2

## 2021-08-17 MED ORDER — LORAZEPAM 2 MG/ML IJ SOLN
0.5000 mg | Freq: Once | INTRAMUSCULAR | Status: AC
  Administered 2021-08-17: 0.5 mg via INTRAVENOUS
  Filled 2021-08-17: qty 1

## 2021-08-17 NOTE — ED Provider Notes (Signed)
[facility] DEPT Provider Note   CSN: [id number] Arrival date & time: 08/17/21  1613     History  Chief Complaint  Patient presents with   [name] is a 71 y.o. female.  The history is provided by the patient and medical records. No language interpreter was used.  Shortness of Breath Severity:  Severe Onset quality:  Gradual Duration:  3 days Timing:  Constant Progression:  Worsening Chronicity:  Recurrent Context: not URI   Relieved by:  Nothing Worsened by:  Deep breathing Ineffective treatments:  Inhaler Associated symptoms: chest pain (tightness) and wheezing   Associated symptoms: no abdominal pain, no cough, no diaphoresis, no fever, no headaches, no hemoptysis, no neck pain, no rash, no sputum production and no vomiting   Risk factors: no hx of PE/DVT        Home Medications Prior to Admission medications   Medication Sig Start Date End Date Taking? Authorizing Provider  acyclovir (ZOVIRAX) 200 MG capsule Take 200 mg by mouth 5 (five) times daily as needed.     [provider]  albuterol (ACCUNEB) 0.63 MG/3ML nebulizer solution Take 3 mLs (0.63 mg total) by nebulization every 6 (six) hours as needed for wheezing. 05/23/21   Margaretha Seeds, MD  albuterol (VENTOLIN HFA) 108 (90 Base) MCG/ACT inhaler as needed for wheezing. 12/31/20   [provider]  ALPRAZolam Duanne Moron) 0.25 MG tablet 3 (three) times daily as needed.  08/10/15   [provider]  amLODipine (NORVASC) 5 MG tablet Take 5 mg by mouth daily.  12/24/16   [provider]  ARIPiprazole (ABILIFY) 5 MG tablet Take 5 mg by mouth daily. 01/23/21   [provider]  aspirin EC 325 MG tablet Take 325 mg by mouth 2 (two) times daily.     [provider]  desvenlafaxine (PRISTIQ) 100 MG 24 hr tablet Take 100 mg by mouth daily. 09/13/19   [provider]  desvenlafaxine (PRISTIQ) 25 MG 24 hr  tablet Take 25 mg by mouth daily. Take with '100mg'$  tab    [provider]  Dexmethylphenidate HCl 40 MG CP24 Take 40 mg daily by mouth. 01/08/17   [provider]  fluticasone-salmeterol (ADVAIR DISKUS) 500-50 MCG/ACT AEPB Inhale 1 puff into the lungs in the morning and at bedtime. 08/12/21   Margaretha Seeds, MD  L-Methylfolate-Algae (DEPLIN 15 PO) Take 1 tablet daily by mouth.    [provider]  levothyroxine (SYNTHROID, LEVOTHROID) 25 MCG tablet Take 25 mcg by mouth daily before breakfast.    [provider]  losartan-hydrochlorothiazide (HYZAAR) 100-25 MG per tablet Take 1 tablet by mouth daily.    [provider]  montelukast (SINGULAIR) 10 MG tablet Take 1 tablet (10 mg total) by mouth at bedtime. 03/19/21   Fenton Foy, NP  nadolol (CORGARD) 20 MG tablet Take 1 tablet (20 mg total) by mouth daily. 01/29/21   Baldwin Jamaica, PA-C  omeprazole (PRILOSEC) 40 MG capsule Take 1 capsule (40 mg total) by mouth every morning. 08/15/21   Margaretha Seeds, MD  potassium chloride (KLOR-CON M) 10 MEQ tablet Take 10 mEq by mouth daily. 10/27/20   [provider]  pramipexole (MIRAPEX) 0.125 MG tablet  12/26/15   [provider]  predniSONE (DELTASONE) 10 MG tablet Take 4 tablets (40 mg total) by mouth daily with breakfast for 2 days, THEN 3 tablets (30 mg total) daily with breakfast for 2  days, THEN 2 tablets (20 mg total) daily with breakfast for 2 days, THEN 1 tablet (10 mg total) daily with breakfast for 2 days. 08/12/21 08/20/21  Margaretha Seeds, MD  predniSONE (DELTASONE) 10 MG tablet Take 4 tablets (40 mg total) by mouth daily with breakfast for 2 days, THEN 3 tablets (30 mg total) daily with breakfast for 2 days, THEN 2 tablets (20 mg total) daily with breakfast for 2 days, THEN 1 tablet (10 mg total) daily with breakfast for 2 days. 08/12/21 08/20/21  Margaretha Seeds, MD  rosuvastatin (CRESTOR) 10 MG tablet Take 10 mg by mouth daily.  12/15/16   [provider]  telmisartan (MICARDIS) 80 MG tablet Take 80 mg by mouth daily. 01/01/21   [provider]  traZODone (DESYREL) 50 MG tablet Take 50 mg by mouth at bedtime as needed for sleep.    [provider]      Allergies    Penicillins, Iodinated contrast media, Iohexol, and Nsaids    Review of Systems   Review of Systems  Constitutional:  Negative for chills, diaphoresis, fatigue and fever.  HENT:  Negative for congestion.   Respiratory:  Positive for chest tightness, shortness of breath and wheezing. Negative for cough, hemoptysis, sputum production and stridor.   Cardiovascular:  Positive for chest pain (tightness). Negative for palpitations and leg swelling.  Gastrointestinal:  Negative for abdominal pain, constipation, diarrhea, nausea and vomiting.  Genitourinary:  Negative for dysuria, flank pain and frequency.  Musculoskeletal:  Negative for back pain, neck pain and neck stiffness.  Skin:  Negative for rash and wound.  Neurological:  Negative for dizziness, weakness, light-headedness, numbness and headaches.  Psychiatric/Behavioral:  Negative for agitation and confusion.   All other systems reviewed and are negative.   Physical Exam Updated Vital Signs BP 138/79   Pulse 91   Temp 98.6 F (37 C) (Oral)   Resp 18   SpO2 97%  Physical Exam Vitals and nursing note reviewed.  Constitutional:      General: She is not in acute distress.    Appearance: She is well-developed. She is ill-appearing. She is not toxic-appearing or diaphoretic.  HENT:     Head: Normocephalic and atraumatic.     Mouth/Throat:     Pharynx: No pharyngeal swelling or oropharyngeal exudate.  Eyes:     Conjunctiva/sclera: Conjunctivae normal.     Pupils: Pupils are equal, round, and reactive to light.  Cardiovascular:     Rate and Rhythm: Regular rhythm. Tachycardia present.     Heart sounds: No murmur heard. Pulmonary:     Effort: Pulmonary effort is  normal. Tachypnea present. No respiratory distress.     Breath sounds: Decreased breath sounds and wheezing present.  Chest:     Chest wall: No tenderness.  Abdominal:     Palpations: Abdomen is soft.     Tenderness: There is no abdominal tenderness.  Musculoskeletal:        General: No swelling.     Cervical back: Neck supple.     Right lower leg: No tenderness. No edema.     Left lower leg: No tenderness. No edema.  Skin:    General: Skin is warm and dry.     Capillary Refill: Capillary refill takes less than 2 seconds.     Findings: No erythema.  Neurological:     General: No focal deficit present.     Mental Status: She is alert.  Psychiatric:  Mood and Affect: Mood normal.     ED Results / Procedures / Treatments   Labs (all labs ordered are listed, but only abnormal results are displayed) Labs Reviewed  CBC WITH DIFFERENTIAL/PLATELET - Abnormal; Notable for the following components:      Result Value   WBC 13.1 (*)    Neutro Abs 11.0 (*)    Abs Immature Granulocytes 0.25 (*)    All other components within normal limits  COMPREHENSIVE METABOLIC PANEL - Abnormal; Notable for the following components:   Glucose, Bld 134 (*)    All other components within normal limits  BRAIN NATRIURETIC PEPTIDE - Abnormal; Notable for the following components:   B Natriuretic Peptide 111.8 (*)    All other components within normal limits  BLOOD GAS, ARTERIAL - Abnormal; Notable for the following components:   pH, Arterial 7.54 (*)    pCO2 arterial 30 (*)    Acid-Base Excess 3.8 (*)    All other components within normal limits  D-DIMER, QUANTITATIVE  LACTIC ACID, PLASMA  LACTIC ACID, PLASMA  LIPASE, BLOOD  TROPONIN I (HIGH SENSITIVITY)  TROPONIN I (HIGH SENSITIVITY)    EKG EKG Interpretation  Date/Time:  Saturday August 17 2021 16:20:31 EDT Ventricular Rate:  101 PR Interval:  119 QRS Duration: 96 QT Interval:  396 QTC Calculation: 514 R Axis:   42 Text  Interpretation: Sinus tachycardia Minimal ST depression, lateral leads Prolonged QT interval Baseline wander in lead(s) I aVL when compared to prior, still has prolonged QTC but faster rate now. No STEMI Confirmed by Antony Blackbird [phone]) on 08/17/2021 4:47:28 PM  Radiology CT Chest Wo Contrast  Result Date: 08/17/2021 CLINICAL DATA:  71 year old with shortness of breath. EXAM: CT CHEST WITHOUT CONTRAST TECHNIQUE: Multidetector CT imaging of the chest was performed following the standard protocol without IV contrast. RADIATION DOSE REDUCTION: This exam was performed according to the departmental dose-optimization program which includes automated exposure control, adjustment of the mA and/or kV according to patient size and/or use of iterative reconstruction technique. COMPARISON:  Portable chest today, and abdomen and pelvis CT dated 11/10/2018. PA Lat 03/15/2021 FINDINGS: Cardiovascular: The cardiac size is normal. There are trace calcifications left main and LAD coronary artery. No pericardial effusion. Lipomatous hypertrophy interatrial septum is again shown. The pulmonary arteries and veins are normal caliber. There is mild aortic atherosclerosis with scattered calcific plaques without aneurysm. There is normal great vessel branching. Mediastinum/Nodes: No enlarged mediastinal or axillary lymph nodes. Thyroid gland, trachea, and esophagus demonstrate no significant findings. Small hiatal hernia is again noted. Lungs/Pleura: There is scattered linear scarring or atelectasis in the lung bases, mild chronic elevated right hemidiaphragm. Central airways are clear. No pulmonary mass or infiltrate is seen. There is no pleural effusion, thickening or pneumothorax. Upper Abdomen: Old cholecystectomy. No biliary dilatation. Hepatic steatosis. No acute noncontrast CT findings. Musculoskeletal: Slight thoracic kyphodextroscoliosis. Mild osteopenia. No acute or significant osseous findings. No chest wall mass is seen.  IMPRESSION: 1. No acute noncontrast chest CT findings. 2. Lipomatous hypertrophy of the interatrial septum. 3. Aortic and coronary artery atherosclerosis. 4. Hepatic steatosis. 5. Small hiatal hernia. Electronically Signed   By: Telford Nab M.D.   On: 08/17/2021 23:04   DG Chest Portable 1 View  Result Date: 08/17/2021 CLINICAL DATA:  Dyspnea, wheezing EXAM: PORTABLE CHEST 1 VIEW COMPARISON:  03/15/2021 FINDINGS: The heart size and mediastinal contours are within normal limits. Both lungs are clear. The visualized skeletal structures are unremarkable. IMPRESSION: No active disease. Electronically Signed  By: Fidela Salisbury M.D.   On: 08/17/2021 18:02    Procedures Procedures    Medications Ordered in ED Medications  ipratropium-albuterol (DUONEB) 0.5-2.5 (3) MG/3ML nebulizer solution 3 mL (3 mLs Nebulization Given 08/17/21 1715)  methylPREDNISolone sodium succinate (SOLU-MEDROL) 125 mg/2 mL injection 125 mg (125 mg Intravenous Given 08/17/21 1728)  sterile water (preservative free) injection (2 mLs Intravenous Given 08/17/21 1730)  LORazepam (ATIVAN) injection 0.5 mg (0.5 mg Intravenous Given 08/17/21 2150)    ED Course/ Medical Decision Making/ A&P                           Medical Decision Making Amount and/or Complexity of Data Reviewed Labs: ordered. Radiology: ordered.  Risk Prescription drug management. Decision regarding hospitalization.    Becky [name] is a 71 y.o. female with a past medical history significant for hypertension, hypercholesterolemia, previous TIA and stroke, thyroid disease, hyperlipidemia, CKD, asthma, and obstructive lung disease who presents with worsening shortness of breath, wheezing, chest discomfort, and impending sense of doom.  According to patient, for the last few days she has had recurrent shortness of breath and chest tightness.  She reports that over the last few months she has had several episodes where she had asthma exacerbations and  had to get steroids but it keeps returning.  She reports that today her symptoms were to the point where she thought she was going to die and she could not take a deep breath.  She reports a for like a band going around her chest of tightness.  She does not want to call the pain but it is a discomfort going around her chest.  She reports no nausea, vomiting, or diaphoresis.  She denies any productive cough.  She denies any fevers, chills, or abdominal pain.  She denies any leg pain or leg swelling.  She denies any recent travel.  She denies any constipation, diarrhea, or urinary symptoms.  She said that when she arrived she was so short of breath they placed her on oxygen and that seemed to help.  On exam, patient does have diminished breath sounds bilaterally with what sounds to be some faint wheezing with poor air movement.  Hard to discern if there were any crackles or rales.  Chest nontender.  Abdomen nontender.  Good pulses in extremities.  Legs nontender and nonedematous.  EKG does not show STEMI.  Clinically I am concerned about the patient's increased work of breathing and now being on 2 L to maintain her work of breathing and maintain oxygen saturations.  She will be given some steroids and a DuoNeb to help with her wheezing that I appreciate on exam however we will get x-ray and labs.  I am concerned that this bandlike tightness discomfort across her chest that would not let her take a deep breath does need to rule out for pulm embolism however she has a contrast allergy and she had to get epinephrine last time she had contrast.  Thus, we will do a D-dimer and if it is negative we will feel there is unlikely a PE but if it is positive she will likely need a VQ scan.  Due to her increased work of breathing and chest tightness and since that she was going to die today, I do anticipate she may require admission.  She was told to come to the emergency department by her pulmonology team.  Anticipate  reassessment after work-up.  9:37 PM Work-up  has begun to return and is only significant for elevated BNP compared to prior.  D-dimer is negative for PE.  Troponin negative x2.  CBC and CMP only shows mild leukocytosis.  X-ray does not show pneumonia.  Lactic acid normal x2.  Lipase not elevated.  Metabolic panel reassuring.  Patient walked to the bathroom and was extremely tachypneic and short of breath.  Even now, she is breathing in the 30s with increased work of breathing and is very ill-appearing.  Unclear if this is just a new presentation of heart failure versus asthma exacerbation, I do not feel safe with her going home breathing like she is.  We will call for admission for shortness of breath.         Final Clinical Impression(s) / ED Diagnoses Final diagnoses:  SOB (shortness of breath)  Wheezing     Clinical Impression: 1. SOB (shortness of breath)   2. Wheezing     Disposition: Admit  This note was prepared with assistance of Dragon voice recognition software. Occasional wrong-word or sound-a-like substitutions may have occurred due to the inherent limitations of voice recognition software.      Yitzhak Awan, Gwenyth Allegra, MD 08/17/21 [phone]

## 2021-08-17 NOTE — ED Notes (Signed)
This RN took pt to the bathroom via wheelchair. While transferring pt from toilet to [facility] pt became [facility]. Pt returned to bed and oxygen placed back on pt, O2 99%. Pt more comfortable after a couple of minutes in the bed.

## 2021-08-17 NOTE — ED Notes (Signed)
Pt stating that she feels hot and SHOB, pt's WOB increased. Pt given cool rag and fan. EDP made aware.

## 2021-08-17 NOTE — Telephone Encounter (Signed)
Called via answering svc Says x 24h -> feels she is air hungry. And feels like she is dying   Plan - advised to go to ER or closest Urgent care    SIGNATURE    Dr. Brand Males, M.D., F.C.C.P,  Pulmonary and Critical Care Medicine Staff Physician, [place] Director - Interstitial Lung Disease  Program  Medical Director - [facility] [place] [facility] [place], [place], [address]  NPI Number:  NPI #[id number] [facility] Number: [id number]  Pager: [phone], If no answer  -[place] or Try [phone] Telephone (clinical office): [phone] Telephone (research): [phone]  2:42 PM 08/17/2021

## 2021-08-17 NOTE — H&P (Signed)
History and Physical    Patient: Becky [name] LEX:[id number] DOB: 1950-07-01 DOA: 08/17/2021 DOS: the patient was seen and examined on 08/17/2021 PCP: System, Provider Not In  Patient coming from: Home  Chief Complaint:  Chief Complaint  Patient presents with   Shortness of Breath   HPI: [name] is a 71 y.o. female with medical history significant of COPD, prior CVA, chronic kidney disease stage III, essential hypertension, hyperlipidemia, hypothyroidism, anxiety disorder, recurrent falls, who presented to the ER with shortness of breath and inability to breathe.  Patient appears to be very anxious.  She is not moving much air.  Complaint of chest tightness and shortness of breath.  Patient apparently called her pulmonologist and told him she was dying due to inability to breathe.  Was seen in the ER and evaluated.  Initial evaluation shows no obvious infiltrate in the lungs.  D-dimer is negative.  She has significant dye allergy so CTA was not done.  Patient currently on 2 L of oxygen.  Does not appear to be overtly hypoxic but patient looks to be in distress.  Tight chest with no air movement.  Suspicion for severe lung disease versus significant panic attacks with anxiety possible.  With a history of COPD patient will be treated as a case of severe respiratory distress secondary to obstructive lung disease  Review of Systems: As mentioned in the history of present illness. All other systems reviewed and are negative. Past Medical History:  Diagnosis Date   Chronic kidney disease    CVA (cerebral vascular accident) ([place]) 11/14/2013   Depression    followed by Dr Waverly Ferrari frequently 10/01/2015   Genital herpes    History of melanoma    followed by derm Dr Delman Cheadle   Hyperlipidemia    Hypertension    MCI (mild cognitive impairment) 10/01/2015   Pure hypercholesterolemia 11/14/2013   Stroke [facility])    Old stroke seen on MRI 12/2009, carotid doppler w minimal plaque, MRI  off the head otherwise neg for acute disease.   Thyroid disease    hypothyroid. Followed by Dr Toy Care   TIA (transient ischemic attack) 8/08   followed by neurologist Dr Leonie Man   Past Surgical History:  Procedure Laterality Date   [place]     x2   Social History:  reports that she has never smoked. She has never used smokeless tobacco. She reports that she does not drink alcohol and does not use drugs.  Allergies  Allergen Reactions   Penicillins Anaphylaxis   Iodinated Contrast Media Other (See Comments)   Iohexol Other (See Comments)     Desc: iodine    Nsaids Other (See Comments)    CKD     Family History  Problem Relation Age of Onset   Breast cancer Mother    Hyperlipidemia Father    Hypertension Father    Heart attack Father    Hypertension Sister    Hyperlipidemia Sister    Dementia Maternal Aunt     Prior to Admission medications   Medication Sig Start Date End Date Taking? Authorizing Provider  albuterol (ACCUNEB) 0.63 MG/3ML nebulizer solution Take 3 mLs (0.63 mg total) by nebulization every 6 (six) hours as needed for wheezing. 05/23/21   Margaretha Seeds, MD  albuterol (VENTOLIN HFA) 108 (90 Base) MCG/ACT inhaler as needed for wheezing. 12/31/20   [provider]  ALPRAZolam (XANAX) 0.25 MG tablet 3 (three) times daily as needed.  08/10/15   [provider]  amLODipine (NORVASC) 5 MG tablet Take 5 mg by mouth daily.  12/24/16   [provider]  ARIPiprazole (ABILIFY) 5 MG tablet Take 5 mg by mouth daily. 01/23/21   [provider]  aspirin EC 325 MG tablet Take 325 mg by mouth 2 (two) times daily.     [provider]  desvenlafaxine (PRISTIQ) 100 MG 24 hr tablet Take 100 mg by mouth daily. 09/13/19   [provider]  desvenlafaxine (PRISTIQ) 25 MG 24 hr tablet Take 25 mg by mouth daily. Take with '100mg'$  tab    [provider]   Dexmethylphenidate HCl 40 MG CP24 Take 40 mg daily by mouth. 01/08/17   [provider]  fluticasone-salmeterol (ADVAIR DISKUS) 500-50 MCG/ACT AEPB Inhale 1 puff into the lungs in the morning and at bedtime. 08/12/21   Margaretha Seeds, MD  hydrochlorothiazide (HYDRODIURIL) 25 MG tablet Take 25 mg by mouth every morning. 08/01/21   [provider]  L-Methylfolate-Algae (DEPLIN 15 PO) Take 1 tablet daily by mouth.    [provider]  levothyroxine (SYNTHROID, LEVOTHROID) 25 MCG tablet Take 25 mcg by mouth daily before breakfast.    [provider]  losartan-hydrochlorothiazide (HYZAAR) 100-25 MG per tablet Take 1 tablet by mouth daily.    [provider]  montelukast (SINGULAIR) 10 MG tablet Take 1 tablet (10 mg total) by mouth at bedtime. 03/19/21   Fenton Foy, NP  nadolol (CORGARD) 20 MG tablet Take 1 tablet (20 mg total) by mouth daily. 01/29/21   Baldwin Jamaica, PA-C  omeprazole (PRILOSEC) 40 MG capsule Take 1 capsule (40 mg total) by mouth every morning. 08/15/21   Margaretha Seeds, MD  pantoprazole (PROTONIX) 40 MG tablet Take 40 mg by mouth 2 (two) times daily. 08/01/21   [provider]  potassium chloride (KLOR-CON M) 10 MEQ tablet Take 10 mEq by mouth daily. 10/27/20   [provider]  pramipexole (MIRAPEX) 0.125 MG tablet  12/26/15   [provider]  pravastatin (PRAVACHOL) 40 MG tablet Take 40 mg by mouth daily. 08/01/21   [provider]  predniSONE (DELTASONE) 10 MG tablet Take 4 tablets (40 mg total) by mouth daily with breakfast for 2 days, THEN 3 tablets (30 mg total) daily with breakfast for 2 days, THEN 2 tablets (20 mg total) daily with breakfast for 2 days, THEN 1 tablet (10 mg total) daily with breakfast for 2 days. 08/12/21 08/20/21  Margaretha Seeds, MD  predniSONE (DELTASONE) 10 MG tablet Take 4 tablets (40 mg total) by mouth daily with breakfast for 2 days, THEN 3 tablets (30 mg total) daily with  breakfast for 2 days, THEN 2 tablets (20 mg total) daily with breakfast for 2 days, THEN 1 tablet (10 mg total) daily with breakfast for 2 days. 08/12/21 08/20/21  Margaretha Seeds, MD  rosuvastatin (CRESTOR) 10 MG tablet Take 10 mg by mouth daily. 12/15/16   [provider]  telmisartan (MICARDIS) 80 MG tablet Take 80 mg by mouth daily. 01/01/21   [provider]  traZODone (DESYREL) 50 MG tablet Take 50 mg by mouth at bedtime as needed for sleep.    [provider]    Physical Exam: Vitals:   08/17/21 2135 08/17/21 2140 08/17/21 2145 08/17/21 2150  BP:      Pulse: 89 87 85 84  Resp: 19 14  20 (!) 28  Temp:      TempSrc:      SpO2: 99% 100% 99% 98%   General: Morbidly obese, in obvious respiratory distress, HEENT: PRL, EOMI, no pallor jaundice Neck: Supple, no JVD no lymphadenopathy Eye: No significant findings Respiratory: Decreased air entry bilaterally, poor auscultation, mild expiratory wheezing, use of extra muscles of respiration Cardiovascular system: Regular rate and rhythm Abdomen, obese, soft, nontender with positive bowel sounds Extremities: No significant edema sinus or clubbing Musculoskeletal: No joint deformity, range of motion within normal Neuro exam: Nonfocal Psych: Very anxious,  Data Reviewed:  pH is 7.54, PCO2 30, PO2 89, chemistry largely within normal except glucose 134.  BNP 111 lactic acid 1.4.  Troponin negative.  White count is 13.1 chest x-ray showed no active disease  Assessment and Plan:  #1 shortness of breath: No clear cause.  Multiple differentials including bronchospasm, PE likely ruled out with negative D-dimer, panic attacks,.  We will get noncontrast CT.  Patient is obviously hyperventilating based on her ABG.  She is on Ativan at home so we will continue with Ativan here.  Continue with steroids nebulizer treatment for asthma exacerbation also.  Monitor on telemetry.  She has been seen by pulmonary in the past.   Depending on the results we may need to get pulmonary consult in the hospital.  #2 anxiety disorder: We will resume Ativan  #3 morbid obesity: Dietary counseling  #4 depression: Resume home regimen  #5 essential hypertension: Confirm and resume home regimen  #6 hyperlipidemia: Confirm on resume home regimen  #7 chronic kidney disease stage III: Stable at baseline  #8 history of CVA: Currently stable at baseline.    Advance Care Planning:   Code Status: Not on file full code  Consults: None  Family Communication: Husband at bedside  Severity of Illness: The appropriate patient status for this patient is OBSERVATION. Observation status is judged to be reasonable and necessary in order to provide the required intensity of service to ensure the patient's safety. The patient's presenting symptoms, physical exam findings, and initial radiographic and laboratory data in the context of their medical condition is felt to place them at decreased risk for further clinical deterioration. Furthermore, it is anticipated that the patient will be medically stable for discharge from the hospital within 2 midnights of admission.   AuthorBarbette Merino, MD 08/17/2021 10:03 PM  For on call review [email or web address].

## 2021-08-17 NOTE — ED Notes (Signed)
Patient transported to CT 

## 2021-08-17 NOTE — ED Triage Notes (Signed)
Patient c/o SOB for a long time but worsening since yesterday. Reports currently being treated for asthma, using inhaler and nebulizer at home without relief. Denies cough and fever.

## 2021-08-17 NOTE — Progress Notes (Signed)
ABG collected and send to lab for analysis. Notified lab.

## 2021-08-18 ENCOUNTER — Inpatient Hospital Stay (HOSPITAL_COMMUNITY): Payer: Medicare Other

## 2021-08-18 ENCOUNTER — Other Ambulatory Visit: Payer: Self-pay

## 2021-08-18 DIAGNOSIS — R0609 Other forms of dyspnea: Secondary | ICD-10-CM | POA: Diagnosis not present

## 2021-08-18 LAB — ECHOCARDIOGRAM COMPLETE
Area-P 1/2: 5.16 cm2
S' Lateral: 3.3 cm

## 2021-08-18 LAB — COMPREHENSIVE METABOLIC PANEL
ALT: 24 U/L (ref 0–44)
AST: 15 U/L (ref 15–41)
Albumin: 3.7 g/dL (ref 3.5–5.0)
Alkaline Phosphatase: 57 U/L (ref 38–126)
Anion gap: 10 (ref 5–15)
BUN: 25 mg/dL — ABNORMAL HIGH (ref 8–23)
CO2: 26 mmol/L (ref 22–32)
Calcium: 9 mg/dL (ref 8.9–10.3)
Chloride: 101 mmol/L (ref 98–111)
Creatinine, Ser: 0.93 mg/dL (ref 0.44–1.00)
GFR, Estimated: >60 mL/min (ref >60)
Glucose, Bld: 160 mg/dL — ABNORMAL HIGH (ref 70–99)
Potassium: 3.2 mmol/L — ABNORMAL LOW (ref 3.5–5.1)
Sodium: 137 mmol/L (ref 135–145)
Total Bilirubin: 0.6 mg/dL (ref 0.3–1.2)
Total Protein: 6.5 g/dL (ref 6.5–8.1)

## 2021-08-18 LAB — CBC
HCT: 36.6 % (ref 36.0–46.0)
Hemoglobin: 12.3 g/dL (ref 12.0–15.0)
MCH: 32 pg (ref 26.0–34.0)
MCHC: 33.6 g/dL (ref 30.0–36.0)
MCV: 95.3 fL (ref 80.0–100.0)
Platelets: 281 10*3/uL (ref 150–400)
RBC: 3.84 MIL/uL — ABNORMAL LOW (ref 3.87–5.11)
RDW: 13.3 % (ref 11.5–15.5)
WBC: 15 10*3/uL — ABNORMAL HIGH (ref 4.0–10.5)
nRBC: 0 % (ref 0.0–0.2)

## 2021-08-18 LAB — MAGNESIUM: Magnesium: 2.1 mg/dL (ref 1.7–2.4)

## 2021-08-18 LAB — HIV ANTIBODY (ROUTINE TESTING W REFLEX): HIV Screen 4th Generation wRfx: NONREACTIVE

## 2021-08-18 MED ORDER — PRAVASTATIN SODIUM 40 MG PO TABS
40.0000 mg | ORAL_TABLET | Freq: Every day | ORAL | Status: DC
  Administered 2021-08-18 – 2021-08-20 (×3): 40 mg via ORAL
  Filled 2021-08-18 (×3): qty 1

## 2021-08-18 MED ORDER — IPRATROPIUM-ALBUTEROL 0.5-2.5 (3) MG/3ML IN SOLN
3.0000 mL | Freq: Four times a day (QID) | RESPIRATORY_TRACT | Status: DC
Start: 2021-08-18 — End: 2021-08-18
  Administered 2021-08-18 (×4): 3 mL via RESPIRATORY_TRACT
  Filled 2021-08-18 (×4): qty 3

## 2021-08-18 MED ORDER — ALBUTEROL SULFATE HFA 108 (90 BASE) MCG/ACT IN AERS: 1.0000 | INHALATION_SPRAY | RESPIRATORY_TRACT | Status: DC | PRN

## 2021-08-18 MED ORDER — POTASSIUM CHLORIDE CRYS ER 20 MEQ PO TBCR
40.0000 meq | EXTENDED_RELEASE_TABLET | Freq: Once | ORAL | Status: AC
  Administered 2021-08-18: 40 meq via ORAL
  Filled 2021-08-18: qty 2

## 2021-08-18 MED ORDER — LEVOTHYROXINE SODIUM 25 MCG PO TABS
25.0000 ug | ORAL_TABLET | Freq: Every day | ORAL | Status: DC
  Administered 2021-08-19 – 2021-08-20 (×2): 25 ug via ORAL
  Filled 2021-08-18 (×2): qty 1

## 2021-08-18 MED ORDER — IPRATROPIUM-ALBUTEROL 0.5-2.5 (3) MG/3ML IN SOLN
3.0000 mL | Freq: Three times a day (TID) | RESPIRATORY_TRACT | Status: DC
Start: 2021-08-19 — End: 2021-08-20
  Administered 2021-08-19 – 2021-08-20 (×5): 3 mL via RESPIRATORY_TRACT
  Filled 2021-08-18 (×6): qty 3

## 2021-08-18 MED ORDER — MORPHINE SULFATE (PF) 2 MG/ML IV SOLN: 2.0000 mg | INTRAVENOUS | Status: DC | PRN

## 2021-08-18 MED ORDER — ENOXAPARIN SODIUM 40 MG/0.4ML IJ SOSY
40.0000 mg | PREFILLED_SYRINGE | INTRAMUSCULAR | Status: DC
  Administered 2021-08-18 – 2021-08-20 (×3): 40 mg via SUBCUTANEOUS
  Filled 2021-08-18 (×3): qty 0.4

## 2021-08-18 MED ORDER — ALBUTEROL SULFATE (2.5 MG/3ML) 0.083% IN NEBU: 2.5000 mg | INHALATION_SOLUTION | Freq: Four times a day (QID) | RESPIRATORY_TRACT | Status: DC

## 2021-08-18 MED ORDER — PANTOPRAZOLE SODIUM 40 MG PO TBEC
40.0000 mg | DELAYED_RELEASE_TABLET | Freq: Two times a day (BID) | ORAL | Status: DC
  Administered 2021-08-18 – 2021-08-20 (×5): 40 mg via ORAL
  Filled 2021-08-18 (×5): qty 1

## 2021-08-18 MED ORDER — NADOLOL 20 MG PO TABS
20.0000 mg | ORAL_TABLET | Freq: Every day | ORAL | Status: DC
  Administered 2021-08-18 – 2021-08-20 (×3): 20 mg via ORAL
  Filled 2021-08-18 (×3): qty 1

## 2021-08-18 MED ORDER — ARIPIPRAZOLE 5 MG PO TABS
5.0000 mg | ORAL_TABLET | Freq: Every day | ORAL | Status: DC
  Administered 2021-08-18 – 2021-08-20 (×3): 5 mg via ORAL
  Filled 2021-08-18 (×3): qty 1

## 2021-08-18 MED ORDER — PERFLUTREN LIPID MICROSPHERE
1.0000 mL | INTRAVENOUS | Status: AC | PRN
  Administered 2021-08-18: 3 mL via INTRAVENOUS

## 2021-08-18 MED ORDER — IPRATROPIUM BROMIDE 0.02 % IN SOLN: 0.5000 mg | Freq: Four times a day (QID) | RESPIRATORY_TRACT | Status: DC

## 2021-08-18 MED ORDER — ONDANSETRON HCL 4 MG/2ML IJ SOLN
4.0000 mg | Freq: Four times a day (QID) | INTRAMUSCULAR | Status: DC | PRN
  Administered 2021-08-20: 4 mg via INTRAVENOUS
  Filled 2021-08-18: qty 2

## 2021-08-18 MED ORDER — ASPIRIN 325 MG PO TBEC
325.0000 mg | DELAYED_RELEASE_TABLET | Freq: Two times a day (BID) | ORAL | Status: DC
  Administered 2021-08-18 – 2021-08-20 (×5): 325 mg via ORAL
  Filled 2021-08-18 (×5): qty 1

## 2021-08-18 MED ORDER — IRBESARTAN 150 MG PO TABS
75.0000 mg | ORAL_TABLET | Freq: Every day | ORAL | Status: DC
  Administered 2021-08-18 – 2021-08-20 (×3): 75 mg via ORAL
  Filled 2021-08-18 (×3): qty 1

## 2021-08-18 MED ORDER — MONTELUKAST SODIUM 10 MG PO TABS
10.0000 mg | ORAL_TABLET | Freq: Every day | ORAL | Status: DC
  Administered 2021-08-18 – 2021-08-19 (×2): 10 mg via ORAL
  Filled 2021-08-18 (×2): qty 1

## 2021-08-18 MED ORDER — DESVENLAFAXINE SUCCINATE ER 50 MG PO TB24
100.0000 mg | ORAL_TABLET | Freq: Every day | ORAL | Status: DC
  Administered 2021-08-19 – 2021-08-20 (×2): 100 mg via ORAL
  Filled 2021-08-18 (×3): qty 2

## 2021-08-18 MED ORDER — MOMETASONE FURO-FORMOTEROL FUM 200-5 MCG/ACT IN AERO
2.0000 | INHALATION_SPRAY | Freq: Two times a day (BID) | RESPIRATORY_TRACT | Status: DC
  Administered 2021-08-18 – 2021-08-20 (×5): 2 via RESPIRATORY_TRACT
  Filled 2021-08-18: qty 8.8

## 2021-08-18 MED ORDER — PRAMIPEXOLE DIHYDROCHLORIDE 0.125 MG PO TABS
0.1250 mg | ORAL_TABLET | Freq: Every day | ORAL | Status: DC
  Administered 2021-08-18 – 2021-08-19 (×2): 0.125 mg via ORAL
  Filled 2021-08-18 (×2): qty 1

## 2021-08-18 MED ORDER — ONDANSETRON HCL 4 MG PO TABS: 4.0000 mg | ORAL_TABLET | Freq: Four times a day (QID) | ORAL | Status: DC | PRN

## 2021-08-18 MED ORDER — ALBUTEROL SULFATE (2.5 MG/3ML) 0.083% IN NEBU: 3.0000 mL | INHALATION_SOLUTION | Freq: Four times a day (QID) | RESPIRATORY_TRACT | Status: DC | PRN

## 2021-08-18 MED ORDER — AMLODIPINE BESYLATE 5 MG PO TABS
5.0000 mg | ORAL_TABLET | Freq: Every day | ORAL | Status: DC
  Administered 2021-08-18 – 2021-08-20 (×3): 5 mg via ORAL
  Filled 2021-08-18 (×3): qty 1

## 2021-08-18 MED ORDER — DEXMETHYLPHENIDATE HCL ER 20 MG PO CP24: 40.0000 mg | ORAL_CAPSULE | Freq: Every morning | ORAL | Status: DC

## 2021-08-18 MED ORDER — ALPRAZOLAM 0.25 MG PO TABS
0.2500 mg | ORAL_TABLET | Freq: Three times a day (TID) | ORAL | Status: DC | PRN
  Administered 2021-08-18 – 2021-08-19 (×4): 0.25 mg via ORAL
  Filled 2021-08-18 (×4): qty 1

## 2021-08-18 MED ORDER — DESVENLAFAXINE SUCCINATE ER 25 MG PO TB24: 25.0000 mg | ORAL_TABLET | Freq: Every day | ORAL | Status: DC

## 2021-08-18 MED ORDER — METHYLPREDNISOLONE SODIUM SUCC 40 MG IJ SOLR
40.0000 mg | Freq: Two times a day (BID) | INTRAMUSCULAR | Status: DC
  Administered 2021-08-18 – 2021-08-20 (×5): 40 mg via INTRAVENOUS
  Filled 2021-08-18 (×5): qty 1

## 2021-08-18 MED ORDER — TRAZODONE HCL 50 MG PO TABS
50.0000 mg | ORAL_TABLET | Freq: Every evening | ORAL | Status: DC | PRN
  Administered 2021-08-18 – 2021-08-19 (×3): 50 mg via ORAL
  Filled 2021-08-18 (×3): qty 1

## 2021-08-18 MED ORDER — ALBUTEROL SULFATE (2.5 MG/3ML) 0.083% IN NEBU
2.5000 mg | INHALATION_SOLUTION | RESPIRATORY_TRACT | Status: DC | PRN
  Filled 2021-08-18: qty 3

## 2021-08-18 NOTE — Progress Notes (Signed)
PROGRESS NOTE    Becky [name]  HAL:[id number] DOB: [birth year] DOA: 08/17/2021 PCP: System, Provider Not In  Brief Narrative: 71 year old female with history of stroke hypertension CKD stage III AAA and melanoma and COVID in July 2022 treated with Paxlovid never had to be hospitalized for COVID.  She has taken 3 COVID shots.  She lives alone.  She comes in with complaints of severe anxiety shortness of breath dyspnea on exertion and nonproductive cough.  She was seen by Dr. Loanne Drilling on 08/12/2021 for similar symptoms and was started on prednisone, Advair discus She gets easily short of breath and with minimal activity.  She had a puppy that she had to give up because she could not care for him anymore.  So her activity is limited. She was has moderate persistent asthma.  And she is a long hauler COVID-19 with dyspnea and moderate persistent asthma. She was started on prednisone Advair discus 500/50 albuterol inhaler/nebulizer and she was enrolled into [facility]. Her IgE level was elevated at 131.  Assessment & Plan:   Principal Problem:   Shortness of breath at rest Active Problems:   Essential hypertension, benign   Pure hypercholesterolemia   Thyroid disease   Hypertension   Hyperlipidemia   Depression   Chronic kidney disease   Obstructive lung disease (generalized) ([place])   #1 asthma exacerbation/long-haul COVID patient reports she feels better with having oxygen on. We will check ambulatory oxygen saturation. Continue IV steroids and inhalers  Her IgE level is elevated Continue Singulair Followed by PCCM as an outpatient who has enrolled her to [facility].  #2 severe anxiety and depression continue Xanax, Abilify, Pristiq  #3 hypertension on Norvasc and Avapro nadolol Hctz on hold  #4 hyperlipidemia continue statin  #5 stage IIIa CKD monitor closely and avoid nephrotoxins  #6 history of stroke supportive management  #7 hypothyroidism on Synthroid  #8 GERD she is on  Protonix 40 mg twice a day prior to admission  #10 hyperlipidemia on pravastatin continue  #11 hypokalemia replete potassium check mag level.   Estimated body mass index is 39.49 kg/m as calculated from the following:   Height as of 08/12/21: 5' (1.524 m).   Weight as of 08/12/21: 91.7 kg.  DVT prophylaxis: Lovenox  code Status: Full code  family Communication: None at bedside  disposition Plan:  Status is: Inpatient Remains inpatient appropriate because: Asthma exacerbation   Consultants:  None  Procedures: None Antimicrobials: None  Subjective: She reports she feels better because she is on oxygen does not feel anxious and is requesting for oxygen to go home when she is ready to go home with.  I have reviewed records from office and ER.  Objective: Vitals:   08/18/21 0337 08/18/21 0648 08/18/21 0936 08/18/21 1104  BP: (!) 166/99  (!) 159/85 (!) 150/73  Pulse: 89  99 97  Resp: 16  19   Temp: 98.1 F (36.7 C)   98.2 F (36.8 C)  TempSrc:      SpO2: 100% 95% 100% 93%   No intake or output data in the 24 hours ending 08/18/21 1118 There were no vitals filed for this visit.  Examination:  General exam: Appears calm and comfortable  Respiratory system:few wheezing to auscultation. Respiratory effort normal. Cardiovascular system: S1 & S2 heard, RRR. No JVD, murmurs, rubs, gallops or clicks. No pedal edema. Gastrointestinal system: Abdomen is nondistended, soft and nontender. No organomegaly or masses felt. Normal bowel sounds heard. Central nervous system: Alert and oriented. No  focal neurological deficits. Extremities: No edema Skin: No rashes, lesions or ulcers Psychiatry: Judgement and insight appear normal. Mood & affect appropriate.     Data Reviewed: I have personally reviewed following labs and imaging studies  CBC: Recent Labs  Lab 08/12/21 1444 08/17/21 1737 08/18/21 0727  WBC 11.7* 13.1* 15.0*  NEUTROABS 7.8* 11.0*  --   HGB 12.1 12.3 12.3  HCT  35.1* 36.4 36.6  MCV 91.7 94.1 95.3  PLT 246.0 279 161   Basic Metabolic Panel: Recent Labs  Lab 08/17/21 1737 08/18/21 0727  NA 140 137  K 3.5 3.2*  CL 102 101  CO2 30 26  GLUCOSE 134* 160*  BUN 19 25*  CREATININE 0.98 0.93  CALCIUM 9.3 9.0   GFR: Estimated Creatinine Clearance: 56.9 mL/min (by C-G formula based on SCr of 0.93 mg/dL). Liver Function Tests: Recent Labs  Lab 08/17/21 1737 08/18/21 0727  AST 19 15  ALT 27 24  ALKPHOS 62 57  BILITOT 0.5 0.6  PROT 6.8 6.5  ALBUMIN 4.0 3.7   Recent Labs  Lab 08/17/21 1737  LIPASE 24   No results for input(s): "AMMONIA" in the last 168 hours. Coagulation Profile: No results for input(s): "INR", "PROTIME" in the last 168 hours. Cardiac Enzymes: No results for input(s): "CKTOTAL", "CKMB", "CKMBINDEX", "TROPONINI" in the last 168 hours. BNP (last 3 results) No results for input(s): "PROBNP" in the last 8760 hours. HbA1C: No results for input(s): "HGBA1C" in the last 72 hours. CBG: No results for input(s): "GLUCAP" in the last 168 hours. Lipid Profile: No results for input(s): "CHOL", "HDL", "LDLCALC", "TRIG", "CHOLHDL", "LDLDIRECT" in the last 72 hours. Thyroid Function Tests: No results for input(s): "TSH", "T4TOTAL", "FREET4", "T3FREE", "THYROIDAB" in the last 72 hours. Anemia Panel: No results for input(s): "VITAMINB12", "FOLATE", "FERRITIN", "TIBC", "IRON", "RETICCTPCT" in the last 72 hours. Sepsis Labs: Recent Labs  Lab 08/17/21 1737 08/17/21 1911  LATICACIDVEN 1.7 1.4    No results found for this or any previous visit (from the past 240 hour(s)).       Radiology Studies: CT Chest Wo Contrast  Result Date: 08/17/2021 CLINICAL DATA:  72 year old with shortness of breath. EXAM: CT CHEST WITHOUT CONTRAST TECHNIQUE: Multidetector CT imaging of the chest was performed following the standard protocol without IV contrast. RADIATION DOSE REDUCTION: This exam was performed according to the departmental  dose-optimization program which includes automated exposure control, adjustment of the mA and/or kV according to patient size and/or use of iterative reconstruction technique. COMPARISON:  Portable chest today, and abdomen and pelvis CT dated 11/10/2018. PA Lat 03/15/2021 FINDINGS: Cardiovascular: The cardiac size is normal. There are trace calcifications left main and LAD coronary artery. No pericardial effusion. Lipomatous hypertrophy interatrial septum is again shown. The pulmonary arteries and veins are normal caliber. There is mild aortic atherosclerosis with scattered calcific plaques without aneurysm. There is normal great vessel branching. Mediastinum/Nodes: No enlarged mediastinal or axillary lymph nodes. Thyroid gland, trachea, and esophagus demonstrate no significant findings. Small hiatal hernia is again noted. Lungs/Pleura: There is scattered linear scarring or atelectasis in the lung bases, mild chronic elevated right hemidiaphragm. Central airways are clear. No pulmonary mass or infiltrate is seen. There is no pleural effusion, thickening or pneumothorax. Upper Abdomen: Old cholecystectomy. No biliary dilatation. Hepatic steatosis. No acute noncontrast CT findings. Musculoskeletal: Slight thoracic kyphodextroscoliosis. Mild osteopenia. No acute or significant osseous findings. No chest wall mass is seen. IMPRESSION: 1. No acute noncontrast chest CT findings. 2. Lipomatous hypertrophy of the interatrial  septum. 3. Aortic and coronary artery atherosclerosis. 4. Hepatic steatosis. 5. Small hiatal hernia. Electronically Signed   By: Telford Nab M.D.   On: 08/17/2021 23:04   DG Chest Portable 1 View  Result Date: 08/17/2021 CLINICAL DATA:  Dyspnea, wheezing EXAM: PORTABLE CHEST 1 VIEW COMPARISON:  03/15/2021 FINDINGS: The heart size and mediastinal contours are within normal limits. Both lungs are clear. The visualized skeletal structures are unremarkable. IMPRESSION: No active disease.  Electronically Signed   By: Fidela Salisbury M.D.   On: 08/17/2021 18:02        Scheduled Meds:  amLODipine  5 mg Oral Daily   ARIPiprazole  5 mg Oral Daily   aspirin EC  325 mg Oral BID   desvenlafaxine  100 mg Oral Daily   desvenlafaxine  25 mg Oral Daily   [START ON 08/19/2021] dexmethylphenidate  40 mg Oral q AM   enoxaparin (LOVENOX) injection  40 mg Subcutaneous Q24H   ipratropium-albuterol  3 mL Nebulization Q6H   irbesartan  75 mg Oral Daily   [START ON 08/19/2021] levothyroxine  25 mcg Oral QAC breakfast   methylPREDNISolone (SOLU-MEDROL) injection  40 mg Intravenous Q12H   montelukast  10 mg Oral QHS   nadolol  20 mg Oral Daily   pantoprazole  40 mg Oral BID   potassium chloride  40 mEq Oral Once   pramipexole  0.125 mg Oral QHS   pravastatin  40 mg Oral Daily   Continuous Infusions:   LOS: 1 day    Time spent: 39 min  Georgette Shell, MD 08/18/2021, 11:18 AM

## 2021-08-19 NOTE — Progress Notes (Signed)
SATURATION QUALIFICATIONS: (This note is used to comply with regulatory documentation for home oxygen)  Patient Saturations on Room Air at Rest = 100%  Patient Saturations on Room Air while Ambulating = 96%  Patient Saturations on  Liters of oxygen while Ambulating = % NT due to Saturation > 88%  Please briefly explain why patient needs home oxygen:currently does not require home O2. Recommend another test prior to DC. [place] Pager [phone] Office [phone]

## 2021-08-19 NOTE — TOC Progression Note (Signed)
Transition of Care [facility]) - Progression Note    Patient Details  Name: Becky [name] MRN: [id number] Date of Birth: [birth year]  Transition of Care [facility]) CM/SW Contact  Becky Mouton, RN Phone Number: 08/19/2021, 4:25 PM  Clinical Narrative:     [name] was selected for [facility] Health needs. Referral given to in house rep Becky [name], [place].   Expected Discharge Plan: [place] Barriers to Discharge: No Barriers Identified  Expected Discharge Plan and Services Expected Discharge Plan: [place] Choice: [place] arrangements for the past 2 months: Single Family Home                                       Social Determinants of Health (SDOH) Interventions    Readmission Risk Interventions     No data to display

## 2021-08-19 NOTE — TOC Progression Note (Signed)
Transition of Care [facility]) - Progression Note    Patient Details  Name: Becky [name] MRN: [id number] Date of Birth: 14-Aug-1950  [facility]) CM/SW Contact  Purcell Mouton, RN Phone Number: 08/19/2021, 4:17 PM  Clinical Narrative:    Pt from home and agreed with HHPT. Checking with different agencies.    Expected Discharge Plan: [place] Barriers to Discharge: No Barriers Identified  Expected Discharge Plan and Services Expected Discharge Plan: [place] Choice: [place] arrangements for the past 2 months: Single Family Home                                       Social Determinants of Health (SDOH) Interventions    Readmission Risk Interventions     No data to display

## 2021-08-19 NOTE — Evaluation (Signed)
Physical Therapy Evaluation Patient Details Name: Becky [name] MRN: [id number] DOB: 02/08/1951 Today's Date: 08/19/2021  History of Present Illness  71 year old female with history of stroke hypertension CKD stage III AAA and melanoma and COVID in July 2022 treated with Paxlovid never had to be hospitalized for COVID. Comes in 08/18/21 with complaints of severe anxiety, shortness of breath, dyspnea on exertion and nonproductive cough. .  She had a puppy that she had to give up because she could not care for him anymore.  She  is a long hauler COVID-19 with dyspnea and moderate persistent asthma.  Clinical Impression  The patient admitted with SOB, decreased endurance and activity tolerance. Patient   found in room with family and friends, getting back into bed with supervision. Patient's SPO2   monitored, all activity on RA with SP02 at 96-100%.  Patient states" that's not good. I need Oxygen." Explained  to patient the  qualifications and  recommend another test be performed again prior to DC.  Patient has a depressed and flat affect. Recommend HHPT. Pt admitted with above diagnosis.  Pt currently with functional limitations due to the deficits listed below (see PT Problem List). Pt will benefit from skilled PT to increase their independence and safety with mobility to allow discharge to the venue listed below.        Recommendations for follow up therapy are one component of a multi-disciplinary discharge planning process, led by the attending physician.  Recommendations may be updated based on patient status, additional functional criteria and insurance authorization.  Follow Up Recommendations Home health PT    Assistance Recommended at Discharge Intermittent Supervision/Assistance  Patient can return home with the following  A little help with bathing/dressing/bathroom;A little help with walking and/or transfers;Assistance with cooking/housework;Assist for transportation;Help with  stairs or ramp for entrance    Equipment Recommendations    Recommendations for Other Services       Functional Status Assessment Patient has had a recent decline in their functional status and demonstrates the ability to make significant improvements in function in a reasonable and predictable amount of time.     Precautions / Restrictions Precautions Precautions: Fall Precaution Comments: monitor sats      Mobility  Bed Mobility Overal bed mobility: Modified Independent                  Transfers Overall transfer level: Needs assistance Equipment used: 1 person hand held assist Transfers: Sit to/from Stand Sit to Stand: Supervision                Ambulation/Gait Ambulation/Gait assistance: Min assist Gait Distance (Feet): 40 Feet (x 2 then 15') Assistive device: 1 person hand held assist Gait Pattern/deviations: Step-through pattern, Decreased stride length Gait velocity: decr     General Gait Details: patient moves slowly,  relies on  some UE support  Stairs            Wheelchair Mobility    Modified Rankin (Stroke Patients Only)       Balance Overall balance assessment: Needs assistance, History of Falls Sitting-balance support: Feet supported, No upper extremity supported Sitting balance-Leahy Scale: Good     Standing balance support: No upper extremity supported, During functional activity Standing balance-Leahy Scale: Poor Standing balance comment: reliant on 1 UE support                             Pertinent Vitals/Pain Pain Assessment Pain Assessment:  No/denies pain    Home Living Family/patient expects to be discharged to:: Private residence Living Arrangements: Alone Available Help at Discharge: Friend(s);Available PRN/intermittently Type of Home: House Home Access: Level entry       Home Layout: Two level;One level Home Equipment: None      Prior Function Prior Level of Function :  (has rolled off bed  several times)       Physical Assist : ADLs (physical)   ADLs (physical): Bathing Mobility Comments: reports limited in her home, relies on holding to objects for support ADLs Comments: sponge bathes due to decreased endurance for shower     Hand Dominance   Dominant Hand: Right    Extremity/Trunk Assessment   Upper Extremity Assessment Upper Extremity Assessment: Overall WFL for tasks assessed    Lower Extremity Assessment Lower Extremity Assessment: Overall WFL for tasks assessed    Cervical / Trunk Assessment Cervical / Trunk Assessment: Normal  Communication   Communication: No difficulties  Cognition Arousal/Alertness: Awake/alert Behavior During Therapy: Flat affect (very) Overall Cognitive Status: Within Functional Limits for tasks assessed                                 General Comments: "zombie like" ,depressed affect        General Comments      Exercises     Assessment/Plan    PT Assessment Patient needs continued PT services  PT Problem List Decreased strength;Decreased knowledge of precautions;Decreased mobility;Decreased activity tolerance       PT Treatment Interventions Functional mobility training;DME instruction;Patient/family education;Balance training;Gait training;Therapeutic activities;Therapeutic exercise    PT Goals (Current goals can be found in the Care Plan section)  Acute Rehab PT Goals Patient Stated Goal: to feel better, not be SOB PT Goal Formulation: With patient/family Time For Goal Achievement: 09/02/21 Potential to Achieve Goals: Fair    Frequency Min 3X/week     Co-evaluation               AM-PAC PT "6 Clicks" Mobility  Outcome Measure Help needed turning from your back to your side while in a flat bed without using bedrails?: None Help needed moving from lying on your back to sitting on the side of a flat bed without using bedrails?: None Help needed moving to and from a bed to a chair  (including a wheelchair)?: A Little Help needed standing up from a chair using your arms (e.g., wheelchair or bedside chair)?: A Little Help needed to walk in hospital room?: A Little Help needed climbing 3-5 steps with a railing? : A Lot 6 Click Score: 19    End of Session   Activity Tolerance: Patient limited by fatigue Patient left:  (in BR with CNA) Nurse Communication: Mobility status PT Visit Diagnosis: Unsteadiness on feet (R26.81);Difficulty in walking, not elsewhere classified (R26.2);Muscle weakness (generalized) (M62.81);Repeated falls (R29.6)    Time: 6834-1962 PT Time Calculation (min) (ACUTE ONLY): 28 min   Charges:   PT Evaluation $PT Eval Low Complexity: 1 Low PT Treatments $Gait Training: 8-22 mins        Tresa Endo PT Acute Rehabilitation Services Pager [phone] Office [phone]   Claretha Cooper 08/19/2021, 12:24 PM

## 2021-08-19 NOTE — Progress Notes (Signed)
PROGRESS NOTE    Becky [name]  WUJ:[id number] DOB: 1951/01/07 DOA: 08/17/2021 PCP: System, Provider Not In  Brief Narrative: 71 year old female with history of stroke hypertension CKD stage III AAA and melanoma and COVID in July 2022 treated with Paxlovid never had to be hospitalized for COVID.  She has taken 3 COVID shots.  She lives alone.  She comes in with complaints of severe anxiety shortness of breath dyspnea on exertion and nonproductive cough.  She was seen by Dr. Loanne Drilling on 08/12/2021 for similar symptoms and was started on prednisone, Advair discus She gets easily short of breath and with minimal activity.  She had a puppy that she had to give up because she could not care for him anymore.  So her activity is limited. She was has moderate persistent asthma.  And she is a long hauler COVID-19 with dyspnea and moderate persistent asthma. She was started on prednisone Advair discus 500/50 albuterol inhaler/nebulizer and she was enrolled into [facility]. Her IgE level was elevated at 131.  Assessment & Plan:   Principal Problem:   Shortness of breath at rest Active Problems:   Essential hypertension, benign   Pure hypercholesterolemia   Thyroid disease   Hypertension   Hyperlipidemia   Depression   Chronic kidney disease   Obstructive lung disease (generalized) ([place])   #1 asthma exacerbation/long-haul COVID patient reports she feels better with having oxygen on. Continue IV steroids and inhalers Ambulatory oxygen saturation is 96% on room air Requesting oxygen on discharge explained to her that her saturation is very well normal on room air. Her IgE level is elevated Continue Singulair Followed by PCCM as an outpatient who has enrolled her to [facility].  #2 severe anxiety and depression continue Xanax, Abilify, Pristiq Patient has severe anxiety that is causing her to be short of breath.  Will need outpatient follow-up with psych.  #3 hypertension on Norvasc and Avapro  nadolol Hctz on hold  #4 hyperlipidemia continue statin  #5 stage IIIa CKD monitor closely and avoid nephrotoxins  #6 history of stroke supportive management  #7 hypothyroidism on Synthroid  #8 GERD she is on Protonix 40 mg twice a day prior to admission  #10 hyperlipidemia on pravastatin continue  #11 hypokalemia replete potassium check mag level.   Estimated body mass index is 39.49 kg/m as calculated from the following:   Height as of 08/12/21: 5' (1.524 m).   Weight as of 08/12/21: 91.7 kg.  DVT prophylaxis: Lovenox  code Status: Full code  family Communication: None at bedside  disposition Plan:  Status is: Inpatient Remains inpatient appropriate because: Asthma exacerbation   Consultants:  None  Procedures: None Antimicrobials: None  Subjective:  She is resting in bed her friend is by the bedside she feels better than yesterday however she does not feel back to her baseline Objective: Vitals:   08/18/21 1104 08/18/21 2036 08/19/21 0900 08/19/21 1253  BP: (!) 150/73  (!) 152/80 (!) 147/90  Pulse: 97  95 76  Resp:    20  Temp: 98.2 F (36.8 C)  98 F (36.7 C) 97.7 F (36.5 C)  TempSrc:   Oral Oral  SpO2: 93% 95% 93% 100%   No intake or output data in the 24 hours ending 08/19/21 1341 There were no vitals filed for this visit.  Examination:  General exam: Appears calm and comfortable  Respiratory system:few wheezing to auscultation. Respiratory effort normal. Cardiovascular system: S1 & S2 heard, RRR. No JVD, murmurs, rubs, gallops or clicks.  No pedal edema. Gastrointestinal system: Abdomen is nondistended, soft and nontender. No organomegaly or masses felt. Normal bowel sounds heard. Central nervous system: Alert and oriented. No focal neurological deficits. Extremities: No edema Skin: No rashes, lesions or ulcers Psychiatry: Judgement and insight appear normal. Mood & affect appropriate.     Data Reviewed: I have personally reviewed following labs  and imaging studies  CBC: Recent Labs  Lab 08/12/21 1444 08/17/21 1737 08/18/21 0727  WBC 11.7* 13.1* 15.0*  NEUTROABS 7.8* 11.0*  --   HGB 12.1 12.3 12.3  HCT 35.1* 36.4 36.6  MCV 91.7 94.1 95.3  PLT 246.0 279 099    Basic Metabolic Panel: Recent Labs  Lab 08/17/21 1737 08/18/21 0727 08/18/21 1119  NA 140 137  --   K 3.5 3.2*  --   CL 102 101  --   CO2 30 26  --   GLUCOSE 134* 160*  --   BUN 19 25*  --   CREATININE 0.98 0.93  --   CALCIUM 9.3 9.0  --   MG  --   --  2.1    GFR: Estimated Creatinine Clearance: 56.9 mL/min (by C-G formula based on SCr of 0.93 mg/dL). Liver Function Tests: Recent Labs  Lab 08/17/21 1737 08/18/21 0727  AST 19 15  ALT 27 24  ALKPHOS 62 57  BILITOT 0.5 0.6  PROT 6.8 6.5  ALBUMIN 4.0 3.7    Recent Labs  Lab 08/17/21 1737  LIPASE 24    No results for input(s): "AMMONIA" in the last 168 hours. Coagulation Profile: No results for input(s): "INR", "PROTIME" in the last 168 hours. Cardiac Enzymes: No results for input(s): "CKTOTAL", "CKMB", "CKMBINDEX", "TROPONINI" in the last 168 hours. BNP (last 3 results) No results for input(s): "PROBNP" in the last 8760 hours. HbA1C: No results for input(s): "HGBA1C" in the last 72 hours. CBG: No results for input(s): "GLUCAP" in the last 168 hours. Lipid Profile: No results for input(s): "CHOL", "HDL", "LDLCALC", "TRIG", "CHOLHDL", "LDLDIRECT" in the last 72 hours. Thyroid Function Tests: No results for input(s): "TSH", "T4TOTAL", "FREET4", "T3FREE", "THYROIDAB" in the last 72 hours. Anemia Panel: No results for input(s): "VITAMINB12", "FOLATE", "FERRITIN", "TIBC", "IRON", "RETICCTPCT" in the last 72 hours. Sepsis Labs: Recent Labs  Lab 08/17/21 1737 08/17/21 1911  LATICACIDVEN 1.7 1.4     No results found for this or any previous visit (from the past 240 hour(s)).       Radiology Studies: ECHOCARDIOGRAM COMPLETE  Result Date: 08/18/2021    ECHOCARDIOGRAM REPORT    Patient Name:   WILMARY LEVIT Date of Exam: 08/18/2021 Medical Rec #:  [id number]            Height:       60.0 in Accession #:    [id number]           Weight:       202.2 lb Date of Birth:  05/04/1950            BSA:          1.875 m Patient Age:    3 years             BP:           150/73 mmHg Patient Gender: F                    HR:           100 bpm. Exam Location:  Inpatient Procedure: 2D Echo,  Cardiac Doppler, Color Doppler and Intracardiac            Opacification Agent Indications:    Dyspnea R06.00  History:        Patient has prior history of Echocardiogram examinations, most                 recent 02/21/2021. Stroke and TIA; Risk Factors:Diabetes,                 Dyslipidemia and Hypertension. Thyroid disease. Chronic kidney                 disease.  Sonographer:    Darlina Sicilian RDCS Referring Phys: [phone] [facility] Julious Oka  Sonographer Comments: Technically challenging study due to limited acoustic windows. IMPRESSIONS  1. Left ventricular ejection fraction, by estimation, is 65 to 70%. The left ventricle has normal function. The left ventricle has no regional wall motion abnormalities. There is mild concentric left ventricular hypertrophy. Left ventricular diastolic parameters are consistent with Grade I diastolic dysfunction (impaired relaxation). Elevated left ventricular end-diastolic pressure.  2. Right ventricular systolic function is normal. The right ventricular size is normal. There is normal pulmonary artery systolic pressure.  3. The mitral valve is normal in structure. Trivial mitral valve regurgitation. No evidence of mitral stenosis.  4. The aortic valve is normal in structure. Aortic valve regurgitation is not visualized. No aortic stenosis is present.  5. The inferior vena cava is normal in size with greater than 50% respiratory variability, suggesting right atrial pressure of 3 mmHg. FINDINGS  Left Ventricle: Left ventricular ejection fraction, by estimation, is 65 to 70%. The left  ventricle has normal function. The left ventricle has no regional wall motion abnormalities. Definity contrast agent was given IV to delineate the left ventricular  endocardial borders. The left ventricular internal cavity size was normal in size. There is mild concentric left ventricular hypertrophy. Left ventricular diastolic parameters are consistent with Grade I diastolic dysfunction (impaired relaxation). Elevated left ventricular end-diastolic pressure. Right Ventricle: The right ventricular size is normal. No increase in right ventricular wall thickness. Right ventricular systolic function is normal. There is normal pulmonary artery systolic pressure. The tricuspid regurgitant velocity is 1.81 m/s, and  with an assumed right atrial pressure of 3 mmHg, the estimated right ventricular systolic pressure is 84.6 mmHg. Left Atrium: Left atrial size was normal in size. Right Atrium: Right atrial size was normal in size. Pericardium: There is no evidence of pericardial effusion. Mitral Valve: The mitral valve is normal in structure. Trivial mitral valve regurgitation. No evidence of mitral valve stenosis. Tricuspid Valve: The tricuspid valve is normal in structure. Tricuspid valve regurgitation is trivial. No evidence of tricuspid stenosis. Aortic Valve: The aortic valve is normal in structure. Aortic valve regurgitation is not visualized. No aortic stenosis is present. Pulmonic Valve: The pulmonic valve was normal in structure. Pulmonic valve regurgitation is not visualized. No evidence of pulmonic stenosis. Aorta: The aortic root is normal in size and structure. Venous: The inferior vena cava is normal in size with greater than 50% respiratory variability, suggesting right atrial pressure of 3 mmHg. IAS/Shunts: No atrial level shunt detected by color flow Doppler.  LEFT VENTRICLE PLAX 2D LVIDd:         5.50 cm   Diastology LVIDs:         3.30 cm   LV e' medial:    5.70 cm/s LV PW:         1.10 cm   LV  E/e' medial:   15.9 LV IVS:        1.10 cm   LV e' lateral:   5.45 cm/s LVOT diam:     1.80 cm   LV E/e' lateral: 16.6 LV SV:         62 LV SV Index:   33 LVOT Area:     2.54 cm  RIGHT VENTRICLE RV S prime:     19.20 cm/s LEFT ATRIUM             Index LA diam:        4.00 cm 2.13 cm/m LA Vol (A2C):   51.0 ml 27.20 ml/m LA Vol (A4C):   29.0 ml 15.46 ml/m LA Biplane Vol: 39.3 ml 20.96 ml/m  AORTIC VALVE LVOT Vmax:   137.00 cm/s LVOT Vmean:  90.100 cm/s LVOT VTI:    0.242 m MITRAL VALVE                TRICUSPID VALVE MV Area (PHT): 5.16 cm     TR Peak grad:   13.1 mmHg MV Decel Time: 147 msec     TR Vmax:        181.00 cm/s MV E velocity: 90.40 cm/s MV A velocity: 115.00 cm/s  SHUNTS MV E/A ratio:  0.79         Systemic VTI:  0.24 m                             Systemic Diam: 1.80 cm Skeet Latch MD Electronically signed by Skeet Latch MD Signature Date/Time: 08/18/2021/2:34:22 PM    Final    CT Chest Wo Contrast  Result Date: 08/17/2021 CLINICAL DATA:  71 year old with shortness of breath. EXAM: CT CHEST WITHOUT CONTRAST TECHNIQUE: Multidetector CT imaging of the chest was performed following the standard protocol without IV contrast. RADIATION DOSE REDUCTION: This exam was performed according to the departmental dose-optimization program which includes automated exposure control, adjustment of the mA and/or kV according to patient size and/or use of iterative reconstruction technique. COMPARISON:  Portable chest today, and abdomen and pelvis CT dated 11/10/2018. PA Lat 03/15/2021 FINDINGS: Cardiovascular: The cardiac size is normal. There are trace calcifications left main and LAD coronary artery. No pericardial effusion. Lipomatous hypertrophy interatrial septum is again shown. The pulmonary arteries and veins are normal caliber. There is mild aortic atherosclerosis with scattered calcific plaques without aneurysm. There is normal great vessel branching. Mediastinum/Nodes: No enlarged mediastinal or axillary  lymph nodes. Thyroid gland, trachea, and esophagus demonstrate no significant findings. Small hiatal hernia is again noted. Lungs/Pleura: There is scattered linear scarring or atelectasis in the lung bases, mild chronic elevated right hemidiaphragm. Central airways are clear. No pulmonary mass or infiltrate is seen. There is no pleural effusion, thickening or pneumothorax. Upper Abdomen: Old cholecystectomy. No biliary dilatation. Hepatic steatosis. No acute noncontrast CT findings. Musculoskeletal: Slight thoracic kyphodextroscoliosis. Mild osteopenia. No acute or significant osseous findings. No chest wall mass is seen. IMPRESSION: 1. No acute noncontrast chest CT findings. 2. Lipomatous hypertrophy of the interatrial septum. 3. Aortic and coronary artery atherosclerosis. 4. Hepatic steatosis. 5. Small hiatal hernia. Electronically Signed   By: Telford Nab M.D.   On: 08/17/2021 23:04   DG Chest Portable 1 View  Result Date: 08/17/2021 CLINICAL DATA:  Dyspnea, wheezing EXAM: PORTABLE CHEST 1 VIEW COMPARISON:  03/15/2021 FINDINGS: The heart size and mediastinal contours are within normal limits. Both lungs are clear. The visualized  skeletal structures are unremarkable. IMPRESSION: No active disease. Electronically Signed   By: Fidela Salisbury M.D.   On: 08/17/2021 18:02        Scheduled Meds:  amLODipine  5 mg Oral Daily   ARIPiprazole  5 mg Oral Daily   aspirin EC  325 mg Oral BID   desvenlafaxine  100 mg Oral Daily   desvenlafaxine  25 mg Oral Daily   dexmethylphenidate  40 mg Oral q AM   enoxaparin (LOVENOX) injection  40 mg Subcutaneous Q24H   ipratropium-albuterol  3 mL Nebulization TID   irbesartan  75 mg Oral Daily   levothyroxine  25 mcg Oral QAC breakfast   methylPREDNISolone (SOLU-MEDROL) injection  40 mg Intravenous Q12H   mometasone-formoterol  2 puff Inhalation BID   montelukast  10 mg Oral QHS   nadolol  20 mg Oral Daily   pantoprazole  40 mg Oral BID   pramipexole  0.125 mg  Oral QHS   pravastatin  40 mg Oral Daily   Continuous Infusions:   LOS: 2 days    Time spent: 71 min  Georgette Shell, MD 08/19/2021, 1:41 PM

## 2021-08-19 NOTE — Telephone Encounter (Signed)
Patient currently admitted in patient. Will continue to keep an eye on discharge for [place] [employer] enrollment form to be completed  Knox Saliva, PharmD, MPH, BCPS, CPP Clinical Pharmacist (Rheumatology and Pulmonology)

## 2021-08-19 NOTE — Progress Notes (Signed)
Pt c/o SOB and she feels like she's needing 02 Lena. Pt 02 sat 99% on RA. During PT session, 02 sat stayed on 96-99% RA. Pt feels better with 02 [place], applied 2L [place] for comfort.

## 2021-08-20 MED ORDER — PREDNISONE 10 MG PO TABS
ORAL_TABLET | ORAL | 0 refills | Status: DC
Start: 2021-08-20 — End: 2021-09-11

## 2021-08-20 MED ORDER — METHYLPREDNISOLONE SODIUM SUCC 40 MG IJ SOLR: 40.0000 mg | Freq: Every day | INTRAMUSCULAR | Status: DC

## 2021-08-20 MED ORDER — POTASSIUM CHLORIDE CRYS ER 10 MEQ PO TBCR: 20.0000 meq | EXTENDED_RELEASE_TABLET | Freq: Every day | ORAL | 2 refills | Status: DC

## 2021-08-20 MED ORDER — ONDANSETRON HCL 4 MG PO TABS: 4.0000 mg | ORAL_TABLET | Freq: Four times a day (QID) | ORAL | 0 refills | Status: DC | PRN

## 2021-08-20 MED ORDER — POTASSIUM CHLORIDE CRYS ER 20 MEQ PO TBCR
20.0000 meq | EXTENDED_RELEASE_TABLET | Freq: Once | ORAL | Status: DC
  Filled 2021-08-20: qty 1

## 2021-08-20 NOTE — Evaluation (Signed)
Occupational Therapy Evaluation and Discharge Patient Details Name: Becky [name] MRN: [id number] DOB: 09-Jun-1950 Today's Date: 08/20/2021   History of Present Illness 71 year old female with history of stroke hypertension CKD stage III AAA and melanoma and COVID in July 2022 treated with Paxlovid never had to be hospitalized for COVID. Comes in 08/18/21 with complaints of severe anxiety, shortness of breath, dyspnea on exertion and nonproductive cough. .  She had a puppy that she had to give up because she could not care for him anymore.  She  is a long hauler COVID-19 with dyspnea and moderate persistent asthma.   Clinical Impression   This 71 yo female admitted with above presents to acute OT with all education completed in this venue and [facility] recommended. Pt is overall at an intermittent S level for ADLs and mobility--has friends that can check on her. Recommended that she has her pulmonologist if cardiac/pulmonary rehab may be of benefit to her after she finishes with [facility]. We will D/C from acute OT.      Recommendations for follow up therapy are one component of a multi-disciplinary discharge planning process, led by the attending physician.  Recommendations may be updated based on patient status, additional functional criteria and insurance authorization.   Follow Up Recommendations  Home health OT    Assistance Recommended at Discharge Intermittent Supervision/Assistance  Patient can return home with the following Assistance with cooking/housework;Assist for transportation;Help with stairs or ramp for entrance    Functional Status Assessment  Patient has had a recent decline in their functional status and demonstrates the ability to make significant improvements in function in a reasonable and predictable amount of time.  Equipment Recommendations  None recommended by OT       Precautions / Restrictions Precautions Precautions: Fall Precaution Comments: monitor sats (O2  did not drop below 96% on RA with OT today ambulating in hallway) Restrictions Weight Bearing Restrictions: No      Mobility Bed Mobility               General bed mobility comments: pt sitting oon EOB upon arrival    Transfers Overall transfer level: Needs assistance Equipment used: None Transfers: Sit to/from Stand Sit to Stand: Supervision                  Balance Overall balance assessment: Needs assistance, History of Falls Sitting-balance support: No upper extremity supported, Feet supported Sitting balance-Leahy Scale: Good     Standing balance support: No upper extremity supported, During functional activity Standing balance-Leahy Scale: Fair                             ADL either performed or assessed with clinical judgement   ADL Overall ADL's : Needs assistance/impaired Eating/Feeding: Independent;Sitting   Grooming: Supervision/safety;Standing   Upper Body Bathing: Supervision/ safety;Sitting   Lower Body Bathing: Supervison/ safety;Sit to/from stand   Upper Body Dressing : Supervision/safety;Sitting   Lower Body Dressing: Supervision/safety;Sit to/from stand   Toilet Transfer: Supervision/safety;Ambulation Toilet Transfer Details (indicate cue type and reason): bed>out and down hallway no AD (holding ont rail intermittently), back to sit on bed Toileting- Clothing Manipulation and Hygiene: Supervision/safety;Sit to/from stand         General ADL Comments: Educated on purse lipped breathing, 360 breathing, use of inspirometer. Did recommend to pt that she look into a shower seat that fits her height since sitting to do a task takes less energy than  standing.     Vision Patient Visual Report: No change from baseline              Pertinent Vitals/Pain Pain Assessment Pain Assessment: No/denies pain     Hand Dominance Right   Extremity/Trunk Assessment Upper Extremity Assessment Upper Extremity Assessment:  Generalized weakness           Communication Communication Communication: No difficulties   Cognition Arousal/Alertness: Awake/alert Behavior During Therapy: Flat affect Overall Cognitive Status: Within Functional Limits for tasks assessed                                       General Comments  sats on RA with ambulation never went below 96%            Home Living Family/patient expects to be discharged to:: Private residence Living Arrangements: Alone Available Help at Discharge: Friend(s);Available PRN/intermittently Type of Home: House Home Access: Level entry     Home Layout: One level     Bathroom Shower/Tub: Tub/shower unit;Curtain   Biochemist, clinical: Standard     Home Equipment: Shower seat (too tall)          Prior Functioning/Environment               Mobility Comments: reports limited in her home, relies on holding to objects for support ADLs Comments: sponge bathes due to decreased endurance for shower and shower seat will not go low enough for her to sit on due to her height        OT Problem List: Cardiopulmonary status limiting activity;Impaired balance (sitting and/or standing) (does feel SOB intermittently)         OT Goals(Current goals can be found in the care plan section) Acute Rehab OT Goals Patient Stated Goal: to go home today and feel better         AM-PAC OT "6 Clicks" Daily Activity     Outcome Measure Help from another person eating meals?: None Help from another person taking care of personal grooming?: A Little (S) Help from another person toileting, which includes using toliet, bedpan, or urinal?: A Little (S) Help from another person bathing (including washing, rinsing, drying)?: A Little (S) Help from another person to put on and taking off regular upper body clothing?: A Little (S) Help from another person to put on and taking off regular lower body clothing?: A Little (S) 6 Click Score: 19   End  of Session Nurse Communication:  (O2 sats on RA with ambulation okay)  Activity Tolerance: Patient tolerated treatment well Patient left: with family/visitor present;with nursing/sitter in room (sitting EOB)  OT Visit Diagnosis: Muscle weakness (generalized) (M62.81)                Time: 4174-0814 OT Time Calculation (min): 37 min Charges:  OT General Charges $OT Visit: 1 Visit OT Evaluation $OT Eval Moderate Complexity: 1 Mod OT Treatments $Self Care/Home Management : 23-37 mins  Golden Circle, OTR/L Acute Rehab Services [facility] [phone] Office [phone]    Almon Register 08/20/2021, 2:23 PM

## 2021-08-20 NOTE — Progress Notes (Signed)
Reviewed discharge instructions. Pt verbalized understanding. PIV removed. Belongings removed

## 2021-08-20 NOTE — Discharge Summary (Signed)
Physician Discharge Summary  Becky [name] BRA:[id number] DOB: [birth year] DOA: 08/17/2021  PCP: System, Provider Not In  Admit date: 08/17/2021 Discharge date: 08/20/2021  Admitted From: Home Disposition: Home  Recommendations for Outpatient Follow-up:  Follow up with PCP in 1-2 weeks Please obtain BMP/CBC in one week had hypokalemia Please follow up with Dr. Loanne Drilling Home Health: Yes Equipment/Devices: None Discharge Condition: Stable CODE STATUS: Full code Diet recommendation: Cardiac Brief/Interim Summary:  71 year old female with history of stroke hypertension CKD stage III AAA and melanoma and COVID in July 2022 treated with Paxlovid never had to be hospitalized for COVID.  She has taken 3 COVID shots.  She lives alone.  She comes in with complaints of severe anxiety shortness of breath dyspnea on exertion and nonproductive cough.  She was seen by Dr. Loanne Drilling on 08/12/2021 for similar symptoms and was started on prednisone, Advair discus She gets easily short of breath and with minimal activity.  She had a puppy that she had to give up because she could not care for him anymore.  So her activity is limited. She was has moderate persistent asthma.  And she is a long hauler COVID-19 with dyspnea and moderate persistent asthma. She was started on prednisone Advair discus 500/50 albuterol inhaler/nebulizer and she was enrolled into [employer]. Her IgE level was elevated at 131.    Discharge Diagnoses:  Principal Problem:   Shortness of breath at rest Active Problems:   Essential hypertension, benign   Pure hypercholesterolemia   Thyroid disease   Hypertension   Hyperlipidemia   Depression   Chronic kidney disease   Obstructive lung disease (generalized) ([place])  #1 asthma exacerbation/long-haul COVID -she was treated with IV steroids and nebulizers.  She felt better with oxygen on and was less anxious with oxygen on however she did not drop her saturation with ambulation  therefore she was not discharged on oxygen.  She was discharged on tapering prednisone and asked to follow-up with Dr. Loanne Drilling.  She also has severe anxiety and I have discussed with her to follow-up with outpatient psych Dr Dr. Toy Care.   #2 severe anxiety and depression continue Xanax, Abilify, Pristiq Patient has severe anxiety that is causing her to be short of breath.  Will need outpatient follow-up with psych.   #3 hypertension continue home meds  #4 hyperlipidemia continue statin   #5 stage IIIa CKD stable  #6 history of stroke supportive management   #7 hypothyroidism on Synthroid   #8 GERD she is on Protonix 40 mg twice a day prior to admission   #10 hyperlipidemia on pravastatin continue   #11 hypokalemia repleted Check BMP and mag in 1 week She was already on potassium 10 mEq a day with this dose was increased to 20 mEq a day.  She is also on HCTZ 25 mg a day which is probably depleting her potassium although she is on an ACE inhibitor.    Estimated body mass index is 39.49 kg/m as calculated from the following:   Height as of 08/12/21: 5' (1.524 m).   Weight as of 08/12/21: 91.7 kg.  Discharge Instructions  Discharge Instructions     Diet - low sodium heart healthy   Complete by: As directed    Diet - low sodium heart healthy   Complete by: As directed    Increase activity slowly   Complete by: As directed    Increase activity slowly   Complete by: As directed       Allergies as  of 08/20/2021       Reactions   Penicillins Anaphylaxis   Iodinated Contrast Media Other (See Comments)   Iohexol Other (See Comments)    Desc: iodine   Nsaids Other (See Comments)   CKD        Medication List     TAKE these medications    albuterol 108 (90 Base) MCG/ACT inhaler Commonly known as: VENTOLIN HFA Inhale 1 puff into the lungs every 4 (four) hours as needed for wheezing.   albuterol 0.63 MG/3ML nebulizer solution Commonly known as: ACCUNEB Take 3 mLs (0.63  mg total) by nebulization every 6 (six) hours as needed for wheezing.   ALPRAZolam 0.25 MG tablet Commonly known as: XANAX Take 0.25 mg by mouth 3 (three) times daily as needed for anxiety or sleep.   amLODipine 5 MG tablet Commonly known as: NORVASC Take 5 mg by mouth daily.   ARIPiprazole 5 MG tablet Commonly known as: ABILIFY Take 5 mg by mouth daily.   aspirin EC 325 MG tablet Take 325 mg by mouth 2 (two) times daily.   desvenlafaxine 25 MG 24 hr tablet Commonly known as: PRISTIQ Take 25 mg by mouth daily. Take with '100mg'$  tab   desvenlafaxine 100 MG 24 hr tablet Commonly known as: PRISTIQ Take 100 mg by mouth daily.   Dexmethylphenidate HCl 40 MG Cp24 Take 40 mg daily by mouth.   fluticasone-salmeterol 500-50 MCG/ACT Aepb Commonly known as: Advair Diskus Inhale 1 puff into the lungs in the morning and at bedtime.   hydrochlorothiazide 25 MG tablet Commonly known as: HYDRODIURIL Take 25 mg by mouth every morning.   levothyroxine 25 MCG tablet Commonly known as: SYNTHROID Take 25 mcg by mouth daily before breakfast.   montelukast 10 MG tablet Commonly known as: SINGULAIR Take 1 tablet (10 mg total) by mouth at bedtime.   nadolol 20 MG tablet Commonly known as: CORGARD Take 1 tablet (20 mg total) by mouth daily.   omeprazole 40 MG capsule Commonly known as: PRILOSEC Take 1 capsule (40 mg total) by mouth every morning.   pantoprazole 40 MG tablet Commonly known as: PROTONIX Take 40 mg by mouth 2 (two) times daily.   potassium chloride 10 MEQ tablet Commonly known as: KLOR-CON M Take 10 mEq by mouth daily.   pramipexole 0.125 MG tablet Commonly known as: MIRAPEX Take 0.125 mg by mouth at bedtime.   pravastatin 40 MG tablet Commonly known as: PRAVACHOL Take 40 mg by mouth daily.   predniSONE 10 MG tablet Commonly known as: DELTASONE Take 4 tablets by mouth 6/20 through 08/23/2021 Take 3 tablets by mouth 6/24 through 08/26/2021 Take 2 tablets by  mouth 6/27 through 08/30/2021 Take 1 tablets by mouth daily till seen by pulmonologist What changed: See the new instructions.   telmisartan 80 MG tablet Commonly known as: MICARDIS Take 80 mg by mouth daily.        Follow-up Information     [facility] Follow up.   Specialty: Home Health Services Why: [name] will service you for [place] and will call you before coming to your home. Contact information: [address] [place] [address] [phone]                Allergies  Allergen Reactions   Penicillins Anaphylaxis   Iodinated Contrast Media Other (See Comments)   Iohexol Other (See Comments)     Desc: iodine    Nsaids Other (See Comments)  CKD     Consultations: none   Procedures/Studies: ECHOCARDIOGRAM COMPLETE  Result Date: 08/18/2021    ECHOCARDIOGRAM REPORT   Patient Name:   Becky [name] Date of Exam: 08/18/2021 Medical Rec #:  [id number]            Height:       60.0 in Accession #:    [id number]           Weight:       202.2 lb Date of Birth:  02-22-1951            BSA:          1.875 m Patient Age:    89 years             BP:           150/73 mmHg Patient Gender: F                    HR:           100 bpm. Exam Location:  Inpatient Procedure: 2D Echo, Cardiac Doppler, Color Doppler and Intracardiac            Opacification Agent Indications:    Dyspnea R06.00  History:        Patient has prior history of Echocardiogram examinations, most                 recent 02/21/2021. Stroke and TIA; Risk Factors:Diabetes,                 Dyslipidemia and Hypertension. Thyroid disease. Chronic kidney                 disease.  Sonographer:    Darlina Sicilian RDCS Referring Phys: [phone] [facility] Julious Oka  Sonographer Comments: Technically challenging study due to limited acoustic windows. IMPRESSIONS  1. Left ventricular ejection fraction, by estimation, is 65 to 70%. The left ventricle has normal function. The left  ventricle has no regional wall motion abnormalities. There is mild concentric left ventricular hypertrophy. Left ventricular diastolic parameters are consistent with Grade I diastolic dysfunction (impaired relaxation). Elevated left ventricular end-diastolic pressure.  2. Right ventricular systolic function is normal. The right ventricular size is normal. There is normal pulmonary artery systolic pressure.  3. The mitral valve is normal in structure. Trivial mitral valve regurgitation. No evidence of mitral stenosis.  4. The aortic valve is normal in structure. Aortic valve regurgitation is not visualized. No aortic stenosis is present.  5. The inferior vena cava is normal in size with greater than 50% respiratory variability, suggesting right atrial pressure of 3 mmHg. FINDINGS  Left Ventricle: Left ventricular ejection fraction, by estimation, is 65 to 70%. The left ventricle has normal function. The left ventricle has no regional wall motion abnormalities. Definity contrast agent was given IV to delineate the left ventricular  endocardial borders. The left ventricular internal cavity size was normal in size. There is mild concentric left ventricular hypertrophy. Left ventricular diastolic parameters are consistent with Grade I diastolic dysfunction (impaired relaxation). Elevated left ventricular end-diastolic pressure. Right Ventricle: The right ventricular size is normal. No increase in right ventricular wall thickness. Right ventricular systolic function is normal. There is normal pulmonary artery systolic pressure. The tricuspid regurgitant velocity is 1.81 m/s, and  with an assumed right atrial pressure of 3 mmHg, the estimated right ventricular systolic pressure is 30.1 mmHg. Left Atrium: Left atrial size was normal in size. Right Atrium: Right atrial size was  normal in size. Pericardium: There is no evidence of pericardial effusion. Mitral Valve: The mitral valve is normal in structure. Trivial mitral  valve regurgitation. No evidence of mitral valve stenosis. Tricuspid Valve: The tricuspid valve is normal in structure. Tricuspid valve regurgitation is trivial. No evidence of tricuspid stenosis. Aortic Valve: The aortic valve is normal in structure. Aortic valve regurgitation is not visualized. No aortic stenosis is present. Pulmonic Valve: The pulmonic valve was normal in structure. Pulmonic valve regurgitation is not visualized. No evidence of pulmonic stenosis. Aorta: The aortic root is normal in size and structure. Venous: The inferior vena cava is normal in size with greater than 50% respiratory variability, suggesting right atrial pressure of 3 mmHg. IAS/Shunts: No atrial level shunt detected by color flow Doppler.  LEFT VENTRICLE PLAX 2D LVIDd:         5.50 cm   Diastology LVIDs:         3.30 cm   LV e' medial:    5.70 cm/s LV PW:         1.10 cm   LV E/e' medial:  15.9 LV IVS:        1.10 cm   LV e' lateral:   5.45 cm/s LVOT diam:     1.80 cm   LV E/e' lateral: 16.6 LV SV:         62 LV SV Index:   33 LVOT Area:     2.54 cm  RIGHT VENTRICLE RV S prime:     19.20 cm/s LEFT ATRIUM             Index LA diam:        4.00 cm 2.13 cm/m LA Vol (A2C):   51.0 ml 27.20 ml/m LA Vol (A4C):   29.0 ml 15.46 ml/m LA Biplane Vol: 39.3 ml 20.96 ml/m  AORTIC VALVE LVOT Vmax:   137.00 cm/s LVOT Vmean:  90.100 cm/s LVOT VTI:    0.242 m MITRAL VALVE                TRICUSPID VALVE MV Area (PHT): 5.16 cm     TR Peak grad:   13.1 mmHg MV Decel Time: 147 msec     TR Vmax:        181.00 cm/s MV E velocity: 90.40 cm/s MV A velocity: 115.00 cm/s  SHUNTS MV E/A ratio:  0.79         Systemic VTI:  0.24 m                             Systemic Diam: 1.80 cm Skeet Latch MD Electronically signed by Skeet Latch MD Signature Date/Time: 08/18/2021/2:34:22 PM    Final    CT Chest Wo Contrast  Result Date: 08/17/2021 CLINICAL DATA:  71 year old with shortness of breath. EXAM: CT CHEST WITHOUT CONTRAST TECHNIQUE: Multidetector  CT imaging of the chest was performed following the standard protocol without IV contrast. RADIATION DOSE REDUCTION: This exam was performed according to the departmental dose-optimization program which includes automated exposure control, adjustment of the mA and/or kV according to patient size and/or use of iterative reconstruction technique. COMPARISON:  Portable chest today, and abdomen and pelvis CT dated 11/10/2018. PA Lat 03/15/2021 FINDINGS: Cardiovascular: The cardiac size is normal. There are trace calcifications left main and LAD coronary artery. No pericardial effusion. Lipomatous hypertrophy interatrial septum is again shown. The pulmonary arteries and veins are normal caliber. There is mild aortic atherosclerosis  with scattered calcific plaques without aneurysm. There is normal great vessel branching. Mediastinum/Nodes: No enlarged mediastinal or axillary lymph nodes. Thyroid gland, trachea, and esophagus demonstrate no significant findings. Small hiatal hernia is again noted. Lungs/Pleura: There is scattered linear scarring or atelectasis in the lung bases, mild chronic elevated right hemidiaphragm. Central airways are clear. No pulmonary mass or infiltrate is seen. There is no pleural effusion, thickening or pneumothorax. Upper Abdomen: Old cholecystectomy. No biliary dilatation. Hepatic steatosis. No acute noncontrast CT findings. Musculoskeletal: Slight thoracic kyphodextroscoliosis. Mild osteopenia. No acute or significant osseous findings. No chest wall mass is seen. IMPRESSION: 1. No acute noncontrast chest CT findings. 2. Lipomatous hypertrophy of the interatrial septum. 3. Aortic and coronary artery atherosclerosis. 4. Hepatic steatosis. 5. Small hiatal hernia. Electronically Signed   By: Telford Nab M.D.   On: 08/17/2021 23:04   DG Chest Portable 1 View  Result Date: 08/17/2021 CLINICAL DATA:  Dyspnea, wheezing EXAM: PORTABLE CHEST 1 VIEW COMPARISON:  03/15/2021 FINDINGS: The heart  size and mediastinal contours are within normal limits. Both lungs are clear. The visualized skeletal structures are unremarkable. IMPRESSION: No active disease. Electronically Signed   By: Fidela Salisbury M.D.   On: 08/17/2021 18:02   (Echo, Carotid, EGD, Colonoscopy, ERCP)    Subjective: Patient resting in bed anxious to go home her friends are visiting with her she feels better She is requesting and wanting to go home today I was planning on keeping her another day  Discharge Exam: Vitals:   08/20/21 0548 08/20/21 0859  BP: (!) 144/90   Pulse: 72   Resp: 20   Temp: 98.4 F (36.9 C)   SpO2: 98% 96%   Vitals:   08/19/21 2043 08/19/21 2243 08/20/21 0548 08/20/21 0859  BP: 129/82  (!) 144/90   Pulse: 81 90 72   Resp: 20 (!) 23 20   Temp: 97.9 F (36.6 C)  98.4 F (36.9 C)   TempSrc: Oral     SpO2: 94% 100% 98% 96%    General: Pt is alert, awake, not in acute distress Cardiovascular: RRR, S1/S2 +, no rubs, no gallops Respiratory: Decreased wheezing bilaterally, no wheezing, no rhonchi Abdominal: Soft, NT, ND, bowel sounds + Extremities: no edema, no cyanosis    The results of significant diagnostics from this hospitalization (including imaging, microbiology, ancillary and laboratory) are listed below for reference.     Microbiology: No results found for this or any previous visit (from the past 240 hour(s)).   Labs: BNP (last 3 results) Recent Labs    03/15/21 1329 08/17/21 1737  BNP 58.0 664.4*   Basic Metabolic Panel: Recent Labs  Lab 08/17/21 1737 08/18/21 0727 08/18/21 1119  NA 140 137  --   K 3.5 3.2*  --   CL 102 101  --   CO2 30 26  --   GLUCOSE 134* 160*  --   BUN 19 25*  --   CREATININE 0.98 0.93  --   CALCIUM 9.3 9.0  --   MG  --   --  2.1   Liver Function Tests: Recent Labs  Lab 08/17/21 1737 08/18/21 0727  AST 19 15  ALT 27 24  ALKPHOS 62 57  BILITOT 0.5 0.6  PROT 6.8 6.5  ALBUMIN 4.0 3.7   Recent Labs  Lab 08/17/21 1737   LIPASE 24   No results for input(s): "AMMONIA" in the last 168 hours. CBC: Recent Labs  Lab 08/17/21 1737 08/18/21 0727  WBC 13.1* 15.0*  NEUTROABS 11.0*  --   HGB 12.3 12.3  HCT 36.4 36.6  MCV 94.1 95.3  PLT 279 281   Cardiac Enzymes: No results for input(s): "CKTOTAL", "CKMB", "CKMBINDEX", "TROPONINI" in the last 168 hours. BNP: Invalid input(s): "POCBNP" CBG: No results for input(s): "GLUCAP" in the last 168 hours. D-Dimer Recent Labs    08/17/21 1737  DDIMER 0.31   Hgb A1c No results for input(s): "HGBA1C" in the last 72 hours. Lipid Profile No results for input(s): "CHOL", "HDL", "LDLCALC", "TRIG", "CHOLHDL", "LDLDIRECT" in the last 72 hours. Thyroid function studies No results for input(s): "TSH", "T4TOTAL", "T3FREE", "THYROIDAB" in the last 72 hours.  Invalid input(s): "FREET3" Anemia work up No results for input(s): "VITAMINB12", "FOLATE", "FERRITIN", "TIBC", "IRON", "RETICCTPCT" in the last 72 hours. Urinalysis    Component Value Date/Time   COLORURINE Yellow 06/30/2013 1836   COLORURINE YELLOW 12/17/2009 1932   APPEARANCEUR Clear 06/30/2013 1836   LABSPEC 1.015 06/30/2013 1836   PHURINE 6.0 06/30/2013 1836   PHURINE 6.0 12/17/2009 1932   GLUCOSEU Negative 06/30/2013 1836   HGBUR Negative 06/30/2013 1836   HGBUR NEGATIVE 12/17/2009 1932   BILIRUBINUR Negative 06/30/2013 1836   KETONESUR Negative 06/30/2013 1836   KETONESUR NEGATIVE 12/17/2009 1932   PROTEINUR Negative 06/30/2013 1836   PROTEINUR NEGATIVE 12/17/2009 1932   UROBILINOGEN 0.2 12/17/2009 1932   NITRITE Negative 06/30/2013 1836   NITRITE NEGATIVE 12/17/2009 1932   LEUKOCYTESUR Trace 06/30/2013 1836   Sepsis Labs Recent Labs  Lab 08/17/21 1737 08/18/21 0727  WBC 13.1* 15.0*   Microbiology No results found for this or any previous visit (from the past 240 hour(s)).   Time coordinating discharge:  39 minutes  SIGNED: Georgette Shell, MD  [facility] 08/20/2021,  3:20 PM

## 2021-08-20 NOTE — Care Management Important Message (Signed)
Important Message  Patient Details IM Letter given to the Patient. Name: Becky [name] MRN: [id number] Date of Birth: [birth year]   Medicare Important Message Given:  Yes     Kerin Salen 08/20/2021, 10:29 AM

## 2021-08-23 DIAGNOSIS — J449 Chronic obstructive pulmonary disease, unspecified: Secondary | ICD-10-CM | POA: Diagnosis not present

## 2021-08-23 DIAGNOSIS — N1831 Chronic kidney disease, stage 3a: Secondary | ICD-10-CM | POA: Diagnosis not present

## 2021-08-23 DIAGNOSIS — E039 Hypothyroidism, unspecified: Secondary | ICD-10-CM | POA: Diagnosis not present

## 2021-08-23 DIAGNOSIS — J4541 Moderate persistent asthma with (acute) exacerbation: Secondary | ICD-10-CM | POA: Diagnosis not present

## 2021-08-23 DIAGNOSIS — I1310 Hypertensive heart and chronic kidney disease without heart failure, with stage 1 through stage 4 chronic kidney disease, or unspecified chronic kidney disease: Secondary | ICD-10-CM | POA: Diagnosis not present

## 2021-08-27 ENCOUNTER — Telehealth: Payer: Self-pay | Admitting: Pulmonary Disease

## 2021-08-27 DIAGNOSIS — J449 Chronic obstructive pulmonary disease, unspecified: Secondary | ICD-10-CM | POA: Diagnosis not present

## 2021-08-27 DIAGNOSIS — E039 Hypothyroidism, unspecified: Secondary | ICD-10-CM | POA: Diagnosis not present

## 2021-08-27 DIAGNOSIS — I1310 Hypertensive heart and chronic kidney disease without heart failure, with stage 1 through stage 4 chronic kidney disease, or unspecified chronic kidney disease: Secondary | ICD-10-CM | POA: Diagnosis not present

## 2021-08-27 DIAGNOSIS — J4541 Moderate persistent asthma with (acute) exacerbation: Secondary | ICD-10-CM | POA: Diagnosis not present

## 2021-08-27 DIAGNOSIS — N1831 Chronic kidney disease, stage 3a: Secondary | ICD-10-CM | POA: Diagnosis not present

## 2021-08-27 NOTE — Telephone Encounter (Signed)
Gerrianne Scale but he did not answer. Left message for him to call us back tomorrow morning.

## 2021-08-27 NOTE — Telephone Encounter (Signed)
Called and spoke with patient since Roe Coombs is not on any DPRs for the patient. She stated that she currently feels fine. Denied any chest pain, increased SOB. Roe Coombs is the occupational therapist and he checked her HR and it was 90.   I spoke with Roe Coombs after the patient give me verbal permission. He stated that their supervisor mandates that they call one of the patient's doctors to let them know. Again, patient denied being in any distress.   While on the phone, he wanted to know if Dr. Everardo All would be willing to give a verbal for continued occupational and physical therapy appts as well as raise her HR threshold to 100. The patient is in the process of establishing care with a new PCP since Dr. Tenny Craw has retired. She will them on 09/02/21.   Dr. Everardo All, can you please advise? Thanks!

## 2021-08-27 NOTE — Telephone Encounter (Signed)
Ok to increase HR threshold to 120

## 2021-08-28 NOTE — Telephone Encounter (Signed)
Attempted to call Timmothy Sours but unable to reach. Left message for him to return call.

## 2021-08-29 NOTE — Telephone Encounter (Signed)
Spoke with Becky [name] who was calling about message concerning patient's heart rate. Advised him that Dr. Loanne Drilling said it is ok to increase HR threshold to 120. He expressed understanding. Nothing further needed at this time.

## 2021-08-30 ENCOUNTER — Telehealth: Payer: Self-pay | Admitting: Pulmonary Disease

## 2021-08-30 NOTE — Telephone Encounter (Signed)
Left message for Becky [name] to call back. 

## 2021-09-02 DIAGNOSIS — J4541 Moderate persistent asthma with (acute) exacerbation: Secondary | ICD-10-CM | POA: Diagnosis not present

## 2021-09-02 DIAGNOSIS — R7989 Other specified abnormal findings of blood chemistry: Secondary | ICD-10-CM | POA: Diagnosis not present

## 2021-09-02 DIAGNOSIS — R110 Nausea: Secondary | ICD-10-CM | POA: Diagnosis not present

## 2021-09-02 DIAGNOSIS — R2689 Other abnormalities of gait and mobility: Secondary | ICD-10-CM | POA: Diagnosis not present

## 2021-09-02 DIAGNOSIS — I10 Essential (primary) hypertension: Secondary | ICD-10-CM | POA: Diagnosis not present

## 2021-09-02 DIAGNOSIS — U099 Post covid-19 condition, unspecified: Secondary | ICD-10-CM | POA: Diagnosis not present

## 2021-09-02 DIAGNOSIS — R0683 Snoring: Secondary | ICD-10-CM | POA: Diagnosis not present

## 2021-09-02 DIAGNOSIS — E876 Hypokalemia: Secondary | ICD-10-CM | POA: Diagnosis not present

## 2021-09-02 DIAGNOSIS — Z8673 Personal history of transient ischemic attack (TIA), and cerebral infarction without residual deficits: Secondary | ICD-10-CM | POA: Diagnosis not present

## 2021-09-02 DIAGNOSIS — N189 Chronic kidney disease, unspecified: Secondary | ICD-10-CM | POA: Diagnosis not present

## 2021-09-02 DIAGNOSIS — R0602 Shortness of breath: Secondary | ICD-10-CM | POA: Diagnosis not present

## 2021-09-02 NOTE — Telephone Encounter (Signed)
Called Becky [name] and spoke with Levada Dy about the interactions and stated to her that she needed to call PCP to see if the nadolol could be switched to a different medication and she verbalized understanding. Nothing further needed.

## 2021-09-03 ENCOUNTER — Other Ambulatory Visit: Payer: Self-pay | Admitting: Nurse Practitioner

## 2021-09-03 DIAGNOSIS — J4541 Moderate persistent asthma with (acute) exacerbation: Secondary | ICD-10-CM | POA: Diagnosis not present

## 2021-09-03 DIAGNOSIS — J449 Chronic obstructive pulmonary disease, unspecified: Secondary | ICD-10-CM | POA: Diagnosis not present

## 2021-09-03 DIAGNOSIS — I1310 Hypertensive heart and chronic kidney disease without heart failure, with stage 1 through stage 4 chronic kidney disease, or unspecified chronic kidney disease: Secondary | ICD-10-CM | POA: Diagnosis not present

## 2021-09-03 DIAGNOSIS — E039 Hypothyroidism, unspecified: Secondary | ICD-10-CM | POA: Diagnosis not present

## 2021-09-03 DIAGNOSIS — N1831 Chronic kidney disease, stage 3a: Secondary | ICD-10-CM | POA: Diagnosis not present

## 2021-09-05 DIAGNOSIS — N1831 Chronic kidney disease, stage 3a: Secondary | ICD-10-CM | POA: Diagnosis not present

## 2021-09-05 DIAGNOSIS — E039 Hypothyroidism, unspecified: Secondary | ICD-10-CM | POA: Diagnosis not present

## 2021-09-05 DIAGNOSIS — J449 Chronic obstructive pulmonary disease, unspecified: Secondary | ICD-10-CM | POA: Diagnosis not present

## 2021-09-05 DIAGNOSIS — I1310 Hypertensive heart and chronic kidney disease without heart failure, with stage 1 through stage 4 chronic kidney disease, or unspecified chronic kidney disease: Secondary | ICD-10-CM | POA: Diagnosis not present

## 2021-09-05 DIAGNOSIS — J4541 Moderate persistent asthma with (acute) exacerbation: Secondary | ICD-10-CM | POA: Diagnosis not present

## 2021-09-05 NOTE — Telephone Encounter (Addendum)
Submitted Patient Assistance Application to [employer] for [place] along with provider portion, PA, insurance card copy, and med list. Will update patient when we receive a response.  Fax# [phone] Phone# [phone], option 1, option #  Knox Saliva, PharmD, MPH, BCPS, CPP Clinical Pharmacist (Rheumatology and Pulmonology)

## 2021-09-09 ENCOUNTER — Telehealth: Payer: Self-pay | Admitting: Pulmonary Disease

## 2021-09-09 NOTE — Telephone Encounter (Signed)
Called and spoke with pt stating to her that she should contact medical records to request all records to be sent to provider office and she verbalized understanding. Provided pt with medical records phone number. Nothing further needed.

## 2021-09-11 ENCOUNTER — Encounter: Payer: Self-pay | Admitting: Pulmonary Disease

## 2021-09-11 ENCOUNTER — Ambulatory Visit: Payer: Medicare Other | Admitting: Pulmonary Disease

## 2021-09-11 VITALS — BP 130/72 | HR 66 | Temp 97.8°F | Ht 60.0 in | Wt 210.2 lb

## 2021-09-11 DIAGNOSIS — R5382 Chronic fatigue, unspecified: Secondary | ICD-10-CM | POA: Diagnosis not present

## 2021-09-11 DIAGNOSIS — R0609 Other forms of dyspnea: Secondary | ICD-10-CM

## 2021-09-11 DIAGNOSIS — R0602 Shortness of breath: Secondary | ICD-10-CM | POA: Diagnosis not present

## 2021-09-11 DIAGNOSIS — J4551 Severe persistent asthma with (acute) exacerbation: Secondary | ICD-10-CM

## 2021-09-11 DIAGNOSIS — U099 Post covid-19 condition, unspecified: Secondary | ICD-10-CM | POA: Diagnosis not present

## 2021-09-11 MED ORDER — PREDNISONE 10 MG PO TABS: ORAL_TABLET | ORAL | 0 refills | Status: AC

## 2021-09-11 MED ORDER — FLUTICASONE-SALMETEROL 500-50 MCG/ACT IN AEPB
1.0000 | INHALATION_SPRAY | Freq: Two times a day (BID) | RESPIRATORY_TRACT | 5 refills | Status: DC
Start: 2021-09-11 — End: 2021-10-07

## 2021-09-11 NOTE — Patient Instructions (Signed)
Moderate persistent asthma START prednisone taper STOP Advair 230-21 mcg  CONTINUE Advair Diskus 500-50 ONE puff TWICE a day CONTINUE Albuterol or nebulizer AS NEEDED START enrollment for Dupixent. Message sent to pharmacy. Will determine cost prior to starting  Fatigue Daytime sleepiness ORDER home sleep study  Follow-up with me in 2 months

## 2021-09-11 NOTE — Progress Notes (Signed)
Subjective:   PATIENT ID: Becky [name] GENDER: female DOB: 1951/01/25, MRN: [id number]   HPI  Chief Complaint  Patient presents with   Follow-up    Patient feels like breathing is worse since last visit. Has a dry cough. Patient states she has been wheezing a little more.     Reason for Visit: Follow-up   [name] is a 71 year old female with hx CVA, hx melanoma, HTN, CKD who presents with shortness of breath.  Initial Consult She was diagnosed with COVID-19 in July 2022 and treated with paxlovid. She had persistent symptoms for three weeks with cough, wheezing, shortness of breath, fatigue and insomnia. At this point she has persistent shortness of breath, wheezing with exertion and nonproductive cough. Denies prior history of asthma or frequent respiratory infections. She is albuterol twice a day. She has an prescription for Flovent but only taking if albuterol is not working. Uses it 2-3 times a week. She is walking more slowly due to fatigue and balance issues. She has to stop when walking up a flight stairs. She has to take breaks while vacuuming.   05/06/21 Since our last visit she was stepped up from Flovent to low-dose Advair. Able to tolerate but no significant difference. She has not been able to be active with aerobic activity but does have a new puppy that she is chasing up and down stairs. Planning to start taking her on walks when she is leash trained. Her activity is limited by nausea which she is scheduled to see GI.   08/12/21 Family present and provides additional history. Since our last visit she has been treated with prednisone in March, prednisone and doxycycline in early May, and prednisone taper in late May for shortness of breath and wheezing. With each steroid taper she has near resolution of symptoms and able to perform regular activities. Has shortness of breath and wheezing with exertion. Feels fatigued from her multiple flares.  09/11/21 Since our  last visit she was hospitalized from 08/17/21-08/20/21 for asthma exacerbation requiring oxygen and IV steroids. She reports her breathing is worst with shortness of breath and wheezing. She is nearing the end of her steroid taper and felt symptoms worsen.  She reports poor quality of sleep. Unsure if she snores. Wakes up morning headaches. Has difficulty staying asleep for 2-4 hours at a time. Reports excessive daytime fatigue. Denies falling asleep while being engaged. Sometimes falls asleep while watching tv.  Asthma Control Test ACT Total Score  09/11/2021  2:56 PM 9   2023 Jan Feb Mar April May June July Aug Sept Oct Nov Dec     X  X X        2024 Jan Feb Mar April May June July Aug Sept Oct Nov Dec                2025 Jan Feb Mar April May June July Aug Sept Oct Nov Dec                     09/11/2021    3:24 PM  Results of the [facility] flowsheet  Sitting and reading 2  Watching TV 1  Sitting, inactive in a public place (e.g. a theatre or a meeting) 0  As a passenger in a car for an hour without a break 1  Lying down to rest in the afternoon when circumstances permit 1  Sitting and talking to someone 0  Sitting quietly after a  lunch without alcohol 0  In a car, while stopped for a few minutes in traffic 0  Total score 5    Social History: Never smoker Childhood second hand smoke exposure  Past Medical History:  Diagnosis Date   Chronic kidney disease    CVA (cerebral vascular accident) ([place]) 11/14/2013   Depression    followed by Dr Waverly Ferrari frequently 10/01/2015   Genital herpes    History of melanoma    followed by derm Dr Delman Cheadle   Hyperlipidemia    Hypertension    MCI (mild cognitive impairment) 10/01/2015   Pure hypercholesterolemia 11/14/2013   Stroke [facility])    Old stroke seen on MRI 12/2009, carotid doppler w minimal plaque, MRI off the head otherwise neg for acute disease.   Thyroid disease    hypothyroid. Followed by Dr Toy Care   TIA (transient ischemic attack)  8/08   followed by neurologist Dr Leonie Man     Family History  Problem Relation Age of Onset   Breast cancer Mother    Hyperlipidemia Father    Hypertension Father    Heart attack Father    Hypertension Sister    Hyperlipidemia Sister    Dementia Maternal Aunt      Social History   Occupational History   Not on file  Tobacco Use   Smoking status: Never   Smokeless tobacco: Never  Vaping Use   Vaping Use: Never used  Substance and Sexual Activity   Alcohol use: No   Drug use: No   Sexual activity: Never    Allergies  Allergen Reactions   Penicillins Anaphylaxis   Iodinated Contrast Media Other (See Comments)   Iohexol Other (See Comments)     Desc: iodine    Nsaids Other (See Comments)    CKD      Outpatient Medications Prior to Visit  Medication Sig Dispense Refill   albuterol (ACCUNEB) 0.63 MG/3ML nebulizer solution Take 3 mLs (0.63 mg total) by nebulization every 6 (six) hours as needed for wheezing. 120 mL 12   albuterol (VENTOLIN HFA) 108 (90 Base) MCG/ACT inhaler Inhale 1 puff into the lungs every 4 (four) hours as needed for wheezing.     ALPRAZolam (XANAX) 0.25 MG tablet Take 0.25 mg by mouth 3 (three) times daily as needed for anxiety or sleep.  1   amLODipine (NORVASC) 5 MG tablet Take 5 mg by mouth daily.      ARIPiprazole (ABILIFY) 5 MG tablet Take 5 mg by mouth daily.     aspirin EC 325 MG tablet Take 325 mg by mouth 2 (two) times daily.      desvenlafaxine (PRISTIQ) 100 MG 24 hr tablet Take 100 mg by mouth daily.     desvenlafaxine (PRISTIQ) 25 MG 24 hr tablet Take 25 mg by mouth daily. Take with '100mg'$  tab     Dexmethylphenidate HCl 40 MG CP24 Take 40 mg daily by mouth.     fluticasone-salmeterol (ADVAIR DISKUS) 500-50 MCG/ACT AEPB Inhale 1 puff into the lungs in the morning and at bedtime. 60 each 5   hydrochlorothiazide (HYDRODIURIL) 25 MG tablet Take 25 mg by mouth every morning.     levothyroxine (SYNTHROID, LEVOTHROID) 25 MCG tablet Take 25 mcg by  mouth daily before breakfast.     montelukast (SINGULAIR) 10 MG tablet Take 1 tablet (10 mg total) by mouth at bedtime. 30 tablet 3   nadolol (CORGARD) 20 MG tablet Take 1 tablet (20 mg total) by mouth daily. [place]  tablet 2   omeprazole (PRILOSEC) 40 MG capsule Take 1 capsule (40 mg total) by mouth every morning. 30 capsule 3   pantoprazole (PROTONIX) 40 MG tablet Take 40 mg by mouth 2 (two) times daily.     potassium chloride (KLOR-CON M) 10 MEQ tablet Take 2 tablets (20 mEq total) by mouth daily. 30 tablet 2   pramipexole (MIRAPEX) 0.125 MG tablet Take 0.125 mg by mouth at bedtime.  11   pravastatin (PRAVACHOL) 40 MG tablet Take 40 mg by mouth daily.     predniSONE (DELTASONE) 10 MG tablet Take 4 tablets by mouth 6/20 through 08/23/2021 Take 3 tablets by mouth 6/24 through 08/26/2021 Take 2 tablets by mouth 6/27 through 08/30/2021 Take 1 tablets by mouth daily till seen by pulmonologist 45 tablet 0   telmisartan (MICARDIS) 80 MG tablet Take 80 mg by mouth daily.     No facility-administered medications prior to visit.    Review of Systems  Constitutional:  Positive for malaise/fatigue. Negative for chills, diaphoresis, fever and weight loss.  HENT:  Negative for congestion.   Respiratory:  Positive for cough, shortness of breath and wheezing. Negative for hemoptysis and sputum production.   Cardiovascular:  Negative for chest pain, palpitations and leg swelling.  Neurological:  Positive for headaches.     Objective:   Vitals:   09/11/21 1458  BP: 130/72  Pulse: 66  Temp: 97.8 F (36.6 C)  TempSrc: Oral  SpO2: 98%  Weight: 210 lb 3.2 oz (95.3 kg)  Height: 5' (1.524 m)   SpO2: 98 % O2 Device: None (Room air)  Physical Exam: General: Well-appearing, no acute distress HENT: [place], AT Eyes: EOMI, no scleral icterus Respiratory: Diminished breath sounds with expiratory wheeze Cardiovascular: RRR, -M/R/G, no JVD Extremities:-Edema,-tenderness Neuro: AAO x4, CNII-XII grossly  intact Psych: Normal mood, normal affect   Data Reviewed:  Imaging: CXR 03/15/21 - Lingular scarring. No infiltrate, effusion or edema  PFT: 05/06/21 FVC 1.88 (74%) FEV1 1.5 (79%) Ratio 81  TLC 127% DLCO 89% Interpretation: No obstructive defect on spirometry however air trapping present and F-V curves suggestive of obstructive defect/small airway disease. Normal gas exchange  Labs: CBC    Component Value Date/Time   WBC 15.0 (H) 08/18/2021 0727   RBC 3.84 (L) 08/18/2021 0727   HGB 12.3 08/18/2021 0727   HGB 13.0 06/30/2013 1300   HCT 36.6 08/18/2021 0727   HCT 38.7 06/30/2013 1300   PLT 281 08/18/2021 0727   PLT 341 06/30/2013 1300   MCV 95.3 08/18/2021 0727   MCV 89 06/30/2013 1300   MCH 32.0 08/18/2021 0727   MCHC 33.6 08/18/2021 0727   RDW 13.3 08/18/2021 0727   RDW 13.4 06/30/2013 1300   LYMPHSABS 1.2 08/17/2021 1737   LYMPHSABS 2.7 06/30/2013 1300   MONOABS 0.6 08/17/2021 1737   MONOABS 1.1 (H) 06/30/2013 1300   EOSABS 0.0 08/17/2021 1737   EOSABS 0.2 06/30/2013 1300   BASOSABS 0.0 08/17/2021 1737   BASOSABS 0.1 06/30/2013 1300   Abs eos 08/12/21 -300 IgE 08/12/21 -131    Assessment & Plan:   Discussion: 71 year old with hx COVID 08/2020, hx CVA, hx melanoma, HTN, CKD who presents for follow-up. Has had partial benefit with ICS inhaler with near resolution on prednisone taper. Will treat for flare again and titrate ICS inhaler up. Counseled on inhaler use. Her symptoms are consistent with asthma. With her prior peripheral eosinophilia, will enroll in biologic. Discussed risks and benefits of biologic and will plan to  enroll in [place].  71 year old female never smoker with hx COVID 08/2020 followed by post-viral asthma, hx melanoma, HTN, CKD who presents for follow-up. Has had multiple asthma exacerbation in the last 6 months requiring prednisone and recently hospitalized in June 2023. Symptoms also concerning for underlying OSA.  COVID-19 long hauler manifesting in  dyspnea Moderate persistent asthma START prednisone taper STOP Advair 230-21 mcg  CONTINUE Advair Diskus 500-50 ONE puff TWICE a day CONTINUE Albuterol or nebulizer AS NEEDED START enrollment for Dupixent. Message sent to pharmacy. Will determine cost prior to starting  Fatigue Daytime sleepiness ORDER home sleep study  Health Maintenance Immunization History  Administered Date(s) Administered   Influenza Split 12/30/2020   Influenza, High Dose Seasonal PF 11/05/2016, 12/09/2017, 12/16/2018, 12/29/2019   Influenza,inj,quad, With Preservative 10/19/2014, 10/22/2015   Moderna Sars-Covid-2 Vaccination 02/10/2020   PFIZER(Purple Top)SARS-COV-2 Vaccination 05/14/2019, 06/04/2019   Pneumococcal Conjugate-13 11/05/2016   Pneumococcal Polysaccharide-23 12/25/2017   Td 10/19/2014   Zoster Recombinat (Shingrix) 12/01/2017, 11/19/2018   Zoster, Live 12/01/2017, 11/19/2018   CT Lung Screen - not qualified. Never smoker  No orders of the defined types were placed in this encounter.  Meds ordered this encounter  Medications   predniSONE (DELTASONE) 10 MG tablet    Sig: Take 4 tablets (40 mg total) by mouth daily with breakfast for 2 days, THEN 3 tablets (30 mg total) daily with breakfast for 2 days, THEN 2 tablets (20 mg total) daily with breakfast for 2 days, THEN 1 tablet (10 mg total) daily with breakfast for 2 days.    Dispense:  20 tablet    Refill:  0   fluticasone-salmeterol (ADVAIR DISKUS) 500-50 MCG/ACT AEPB    Sig: Inhale 1 puff into the lungs in the morning and at bedtime.    Dispense:  60 each    Refill:  5    Return in about 2 months (around 11/12/2021).  I have spent a total time of 32-minutes on the day of the appointment including chart review, data review, collecting history, coordinating care and discussing medical diagnosis and plan with the patient/family. Past medical history, allergies, medications were reviewed. Pertinent imaging, labs and tests included in this  note have been reviewed and interpreted independently by me.  Alba, MD [facility] 09/11/2021 3:14 PM  Office Number [phone]

## 2021-09-12 DIAGNOSIS — I1310 Hypertensive heart and chronic kidney disease without heart failure, with stage 1 through stage 4 chronic kidney disease, or unspecified chronic kidney disease: Secondary | ICD-10-CM | POA: Diagnosis not present

## 2021-09-12 DIAGNOSIS — N1831 Chronic kidney disease, stage 3a: Secondary | ICD-10-CM | POA: Diagnosis not present

## 2021-09-12 DIAGNOSIS — J4541 Moderate persistent asthma with (acute) exacerbation: Secondary | ICD-10-CM | POA: Diagnosis not present

## 2021-09-12 DIAGNOSIS — E039 Hypothyroidism, unspecified: Secondary | ICD-10-CM | POA: Diagnosis not present

## 2021-09-12 DIAGNOSIS — J449 Chronic obstructive pulmonary disease, unspecified: Secondary | ICD-10-CM | POA: Diagnosis not present

## 2021-09-13 DIAGNOSIS — N1831 Chronic kidney disease, stage 3a: Secondary | ICD-10-CM | POA: Diagnosis not present

## 2021-09-13 DIAGNOSIS — J449 Chronic obstructive pulmonary disease, unspecified: Secondary | ICD-10-CM | POA: Diagnosis not present

## 2021-09-13 DIAGNOSIS — I1310 Hypertensive heart and chronic kidney disease without heart failure, with stage 1 through stage 4 chronic kidney disease, or unspecified chronic kidney disease: Secondary | ICD-10-CM | POA: Diagnosis not present

## 2021-09-13 DIAGNOSIS — J4541 Moderate persistent asthma with (acute) exacerbation: Secondary | ICD-10-CM | POA: Diagnosis not present

## 2021-09-13 DIAGNOSIS — E039 Hypothyroidism, unspecified: Secondary | ICD-10-CM | POA: Diagnosis not present

## 2021-09-15 ENCOUNTER — Encounter: Payer: Self-pay | Admitting: Pulmonary Disease

## 2021-09-16 ENCOUNTER — Telehealth: Payer: Self-pay | Admitting: Pulmonary Disease

## 2021-09-16 DIAGNOSIS — R051 Acute cough: Secondary | ICD-10-CM | POA: Diagnosis not present

## 2021-09-16 MED ORDER — AZITHROMYCIN 250 MG PO TABS: 250.0000 mg | ORAL_TABLET | Freq: Every day | ORAL | 0 refills | Status: DC

## 2021-09-16 MED ORDER — PREDNISONE 10 MG PO TABS: ORAL_TABLET | ORAL | 0 refills | Status: AC

## 2021-09-16 NOTE — Telephone Encounter (Signed)
She will need to restart the prednisone taper. Zpack also ordered. This has been ordered. Please update her of the prescription. Also if symptoms worsen, we can arrange visit with me or NP for evaluation.

## 2021-09-16 NOTE — Telephone Encounter (Signed)
Called and spoke with patient. She verbalized understanding of recommendations. Nothing further needed at time of call.

## 2021-09-16 NOTE — Telephone Encounter (Signed)
Called and spoke with pt who states her breathing has become worse since last OV 7/12. States that she is also coughing getting up phlegm that has a little green tint to it but will clear up the more she coughs and states that she is also wheezing. Asked pt if she had taken her temp and she said that she had not but states that she does feel feverish. Asked pt if she had taken a covid test and she said that she had not as she does not have any at home.  Pt stated that she does have two days left from prior prednisone Rx. Pt has had to use her nebulizer three times in a single day recently and said that she has not gotten any relief from it and also states that she has had to use her rescue inhaler once daily which also has not helped with her symptoms.  Due to all that is currently going on, pt wants to know what might be able to be recommended to help her out. Dr. Loanne Drilling, please advise.

## 2021-09-17 NOTE — Telephone Encounter (Signed)
Received notification from [employer] that pt was eligible for PAP and that she needed to reach out and express financial hardship.  Contacted pt and informed her of above, she verbalized understanding. Phone number provided and requested that she contact us should she have any problems in the meantime.   Will await further correspondence

## 2021-09-19 ENCOUNTER — Other Ambulatory Visit: Payer: Self-pay | Admitting: Pulmonary Disease

## 2021-09-20 DIAGNOSIS — J208 Acute bronchitis due to other specified organisms: Secondary | ICD-10-CM | POA: Diagnosis not present

## 2021-09-25 DIAGNOSIS — E039 Hypothyroidism, unspecified: Secondary | ICD-10-CM | POA: Diagnosis not present

## 2021-09-25 DIAGNOSIS — I1310 Hypertensive heart and chronic kidney disease without heart failure, with stage 1 through stage 4 chronic kidney disease, or unspecified chronic kidney disease: Secondary | ICD-10-CM | POA: Diagnosis not present

## 2021-09-25 DIAGNOSIS — J4541 Moderate persistent asthma with (acute) exacerbation: Secondary | ICD-10-CM | POA: Diagnosis not present

## 2021-09-25 DIAGNOSIS — J449 Chronic obstructive pulmonary disease, unspecified: Secondary | ICD-10-CM | POA: Diagnosis not present

## 2021-09-25 DIAGNOSIS — N1831 Chronic kidney disease, stage 3a: Secondary | ICD-10-CM | POA: Diagnosis not present

## 2021-09-27 ENCOUNTER — Telehealth: Payer: Self-pay | Admitting: Pulmonary Disease

## 2021-09-27 DIAGNOSIS — E039 Hypothyroidism, unspecified: Secondary | ICD-10-CM | POA: Diagnosis not present

## 2021-09-27 DIAGNOSIS — J449 Chronic obstructive pulmonary disease, unspecified: Secondary | ICD-10-CM | POA: Diagnosis not present

## 2021-09-27 DIAGNOSIS — N1831 Chronic kidney disease, stage 3a: Secondary | ICD-10-CM | POA: Diagnosis not present

## 2021-09-27 DIAGNOSIS — J4541 Moderate persistent asthma with (acute) exacerbation: Secondary | ICD-10-CM | POA: Diagnosis not present

## 2021-09-27 DIAGNOSIS — I1310 Hypertensive heart and chronic kidney disease without heart failure, with stage 1 through stage 4 chronic kidney disease, or unspecified chronic kidney disease: Secondary | ICD-10-CM | POA: Diagnosis not present

## 2021-09-27 NOTE — Telephone Encounter (Signed)
Called and spoke with patient. She stated that she is having some SOB but never took the prednisone that Dr. Loanne Drilling prescribed on 09/16/21. She wanted to know if she could start the prednisone. I read the message from 07/16 and advised her that [place] wanted her to start the prednisone then so it is ok to start now. She verbalized understanding.   Nothing further needed at time of call.

## 2021-09-30 ENCOUNTER — Telehealth: Payer: Self-pay | Admitting: Pulmonary Disease

## 2021-09-30 ENCOUNTER — Other Ambulatory Visit: Payer: Self-pay | Admitting: Pulmonary Disease

## 2021-09-30 DIAGNOSIS — R5382 Chronic fatigue, unspecified: Secondary | ICD-10-CM

## 2021-09-30 NOTE — Telephone Encounter (Signed)
Spoke with the pt  She states that a doctor she was seen by in the hospital a while back ordered HST  They just called her to schedule this but she cancelled bc we ordered one as well and she wants to stick with JE  She is asking about a time frame that this was be done with our office  I am unsure about how far out we are scheduling these recently, so will ask PCC's about this  Please advise thanks!

## 2021-10-01 DIAGNOSIS — J4541 Moderate persistent asthma with (acute) exacerbation: Secondary | ICD-10-CM | POA: Diagnosis not present

## 2021-10-01 DIAGNOSIS — J449 Chronic obstructive pulmonary disease, unspecified: Secondary | ICD-10-CM | POA: Diagnosis not present

## 2021-10-01 DIAGNOSIS — I1310 Hypertensive heart and chronic kidney disease without heart failure, with stage 1 through stage 4 chronic kidney disease, or unspecified chronic kidney disease: Secondary | ICD-10-CM | POA: Diagnosis not present

## 2021-10-01 DIAGNOSIS — E039 Hypothyroidism, unspecified: Secondary | ICD-10-CM | POA: Diagnosis not present

## 2021-10-01 DIAGNOSIS — N1831 Chronic kidney disease, stage 3a: Secondary | ICD-10-CM | POA: Diagnosis not present

## 2021-10-01 NOTE — Telephone Encounter (Signed)
Reece, Collette T to Me  [facility] P[facility] Pool   CR   10/01/21  8:25 AM Hello Magda Paganini  There is no hst order for this patient,can you please place the order and we will check on auth from  Her insurance then call pt to schedule hst thanks   [place]

## 2021-10-03 ENCOUNTER — Telehealth: Payer: Self-pay | Admitting: Pulmonary Disease

## 2021-10-04 DIAGNOSIS — N1831 Chronic kidney disease, stage 3a: Secondary | ICD-10-CM | POA: Diagnosis not present

## 2021-10-04 DIAGNOSIS — E039 Hypothyroidism, unspecified: Secondary | ICD-10-CM | POA: Diagnosis not present

## 2021-10-04 DIAGNOSIS — I1310 Hypertensive heart and chronic kidney disease without heart failure, with stage 1 through stage 4 chronic kidney disease, or unspecified chronic kidney disease: Secondary | ICD-10-CM | POA: Diagnosis not present

## 2021-10-04 DIAGNOSIS — J449 Chronic obstructive pulmonary disease, unspecified: Secondary | ICD-10-CM | POA: Diagnosis not present

## 2021-10-04 DIAGNOSIS — J4541 Moderate persistent asthma with (acute) exacerbation: Secondary | ICD-10-CM | POA: Diagnosis not present

## 2021-10-07 ENCOUNTER — Telehealth: Payer: Self-pay | Admitting: Pulmonary Disease

## 2021-10-07 MED ORDER — FLUTICASONE-SALMETEROL 500-50 MCG/ACT IN AEPB: 1.0000 | INHALATION_SPRAY | Freq: Two times a day (BID) | RESPIRATORY_TRACT | 5 refills | Status: DC

## 2021-10-07 NOTE — Telephone Encounter (Signed)
Spoke with [facility] and resent Advair refill. Spoke with pt and notified her refill had been resent to [facility]. Pt stated understanding. Nothing further needed at this time.   Pt wanted Dr. Loanne Drilling to know that the Advair is working very well for her. Routing to Dr. Loanne Drilling as Juluis Rainier

## 2021-10-07 NOTE — Addendum Note (Signed)
Addended by: Gavin Potters R on: 10/07/2021 10:26 AM   Modules accepted: Orders

## 2021-10-08 ENCOUNTER — Other Ambulatory Visit: Payer: Self-pay | Admitting: *Deleted

## 2021-10-08 ENCOUNTER — Ambulatory Visit: Payer: Medicare Other

## 2021-10-08 ENCOUNTER — Telehealth: Payer: Self-pay | Admitting: Pulmonary Disease

## 2021-10-08 DIAGNOSIS — G4733 Obstructive sleep apnea (adult) (pediatric): Secondary | ICD-10-CM

## 2021-10-08 DIAGNOSIS — R5382 Chronic fatigue, unspecified: Secondary | ICD-10-CM

## 2021-10-08 MED ORDER — FLUTICASONE-SALMETEROL 500-50 MCG/ACT IN AEPB: 1.0000 | INHALATION_SPRAY | Freq: Two times a day (BID) | RESPIRATORY_TRACT | 5 refills | Status: DC

## 2021-10-08 NOTE — Telephone Encounter (Signed)
Called and spoke with patient, advised her that the script had been sent twice on 7/12, on 8/7 and 8/8 and the pharmacy states they have not received the scripts.  I told her that the pharmafcy will keep an eye out for the script and call her when they order it.  She said it works really well.  I advised her to call the pharmacy later this afternoon to make sure they received the script.  She verbalized understanding and said she would call later this afternoon.  Nothing further needed.  Called and spoke with the pharmacy and was told that they did not receive the script for the Advair.  I let them know a script was sent twice on 7/12, on 8/7 and 8/8.  I was told that they did not receive the scripts on 7/12 and 8/7 that had refills and that they had not received the script while I was on the phone for today, but would keep an eye out for it and let the patient know when it is ordered.

## 2021-10-10 DIAGNOSIS — R110 Nausea: Secondary | ICD-10-CM | POA: Diagnosis not present

## 2021-10-10 DIAGNOSIS — R350 Frequency of micturition: Secondary | ICD-10-CM | POA: Diagnosis not present

## 2021-10-15 DIAGNOSIS — J449 Chronic obstructive pulmonary disease, unspecified: Secondary | ICD-10-CM | POA: Diagnosis not present

## 2021-10-15 DIAGNOSIS — E039 Hypothyroidism, unspecified: Secondary | ICD-10-CM | POA: Diagnosis not present

## 2021-10-15 DIAGNOSIS — J4541 Moderate persistent asthma with (acute) exacerbation: Secondary | ICD-10-CM | POA: Diagnosis not present

## 2021-10-15 DIAGNOSIS — G4733 Obstructive sleep apnea (adult) (pediatric): Secondary | ICD-10-CM | POA: Diagnosis not present

## 2021-10-15 DIAGNOSIS — N1831 Chronic kidney disease, stage 3a: Secondary | ICD-10-CM | POA: Diagnosis not present

## 2021-10-15 DIAGNOSIS — I1310 Hypertensive heart and chronic kidney disease without heart failure, with stage 1 through stage 4 chronic kidney disease, or unspecified chronic kidney disease: Secondary | ICD-10-CM | POA: Diagnosis not present

## 2021-10-16 NOTE — Telephone Encounter (Signed)
Refer to encounter from 8/7.

## 2021-10-17 DIAGNOSIS — J4541 Moderate persistent asthma with (acute) exacerbation: Secondary | ICD-10-CM | POA: Diagnosis not present

## 2021-10-17 DIAGNOSIS — E039 Hypothyroidism, unspecified: Secondary | ICD-10-CM | POA: Diagnosis not present

## 2021-10-17 DIAGNOSIS — N1831 Chronic kidney disease, stage 3a: Secondary | ICD-10-CM | POA: Diagnosis not present

## 2021-10-17 DIAGNOSIS — I1310 Hypertensive heart and chronic kidney disease without heart failure, with stage 1 through stage 4 chronic kidney disease, or unspecified chronic kidney disease: Secondary | ICD-10-CM | POA: Diagnosis not present

## 2021-10-17 DIAGNOSIS — J449 Chronic obstructive pulmonary disease, unspecified: Secondary | ICD-10-CM | POA: Diagnosis not present

## 2021-10-21 ENCOUNTER — Telehealth: Payer: Self-pay | Admitting: Pulmonary Disease

## 2021-10-21 DIAGNOSIS — I1310 Hypertensive heart and chronic kidney disease without heart failure, with stage 1 through stage 4 chronic kidney disease, or unspecified chronic kidney disease: Secondary | ICD-10-CM | POA: Diagnosis not present

## 2021-10-21 DIAGNOSIS — J4541 Moderate persistent asthma with (acute) exacerbation: Secondary | ICD-10-CM | POA: Diagnosis not present

## 2021-10-21 DIAGNOSIS — E039 Hypothyroidism, unspecified: Secondary | ICD-10-CM | POA: Diagnosis not present

## 2021-10-21 DIAGNOSIS — G4733 Obstructive sleep apnea (adult) (pediatric): Secondary | ICD-10-CM

## 2021-10-21 DIAGNOSIS — J449 Chronic obstructive pulmonary disease, unspecified: Secondary | ICD-10-CM | POA: Diagnosis not present

## 2021-10-21 DIAGNOSIS — N1831 Chronic kidney disease, stage 3a: Secondary | ICD-10-CM | POA: Diagnosis not present

## 2021-10-21 NOTE — Telephone Encounter (Signed)
[place] [facility] Results  HST 10/08/21 - AHI 81.3 with nadir SpO2 85%/average SpO2 92%  Assessment/Plan Severe OSA - Discussed results with patient regarding PAP titration vs trial with autoPAP. After shared decision making will pursue PAP titration. --ORDER PAP titration study  Rodman Pickle, M.D. [facility]/Critical Care Medicine 10/21/2021 7:47 PM

## 2021-10-23 ENCOUNTER — Other Ambulatory Visit: Payer: Self-pay | Admitting: Physician Assistant

## 2021-11-01 ENCOUNTER — Other Ambulatory Visit: Payer: Self-pay | Admitting: Pulmonary Disease

## 2021-11-01 NOTE — Telephone Encounter (Signed)
Called Dupix[employer] regarding pt assistance application status.  Patient has not called in to express financial hardship as discussed with [place]. ATC patient to review. Left detailed VM for patient  Phone# [phone], option 1, option # Monday through Friday 8am-9p except major federal holidays  Knox Saliva, PharmD, MPH, BCPS, CPP Clinical Pharmacist (Rheumatology and Pulmonology)

## 2021-11-06 NOTE — Telephone Encounter (Signed)
ATC patient regarding [place] patient assistance program next steps. Unable to reach. Left detailed VM regarding next steps of calling company to express inability to afford out-of-pocket cost of Dupixent  Knox Saliva, PharmD, MPH, BCPS, CPP Clinical Pharmacist (Rheumatology and Pulmonology)

## 2021-11-12 NOTE — Telephone Encounter (Addendum)
I spoke with patient. She states that she is doing significantly better since starting Advair. She states she is unsure if she wants to start [place] if her asthma feels stable currently. She states she has not had any asthma exacerbations since starting Advair. I advised her to discuss at [place] with Dr. Loanne Drilling on 11/20/21. I did review that we do not deem it appropriate to escalate treatment and medication burden unless clinically necessary.  She also states that she is no longer interested in sleep study. Provided her with phone number to cancel it (currently scheduled for 11/20/21)  Knox Saliva, PharmD, MPH, BCPS, CPP Clinical Pharmacist (Rheumatology and Pulmonology)

## 2021-11-20 ENCOUNTER — Ambulatory Visit: Payer: Medicare Other | Admitting: Pulmonary Disease

## 2021-11-20 ENCOUNTER — Encounter: Payer: Self-pay | Admitting: Pulmonary Disease

## 2021-11-20 ENCOUNTER — Encounter (HOSPITAL_BASED_OUTPATIENT_CLINIC_OR_DEPARTMENT_OTHER): Payer: Medicare Other | Admitting: Pulmonary Disease

## 2021-11-20 VITALS — BP 142/84 | HR 122 | Ht 60.0 in | Wt 203.0 lb

## 2021-11-20 DIAGNOSIS — Z23 Encounter for immunization: Secondary | ICD-10-CM | POA: Diagnosis not present

## 2021-11-20 DIAGNOSIS — J4540 Moderate persistent asthma, uncomplicated: Secondary | ICD-10-CM

## 2021-11-20 DIAGNOSIS — G43809 Other migraine, not intractable, without status migrainosus: Secondary | ICD-10-CM

## 2021-11-20 NOTE — Progress Notes (Signed)
Noted. Will close [place] encounter. Thanks!  Knox Saliva, PharmD, MPH, BCPS, CPP Clinical Pharmacist (Rheumatology and Pulmonology)

## 2021-11-20 NOTE — Telephone Encounter (Signed)
Patient has opted out of moving forward with [place]. Closing encounter  Knox Saliva, PharmD, MPH, BCPS, CPP Clinical Pharmacist (Rheumatology and Pulmonology)

## 2021-11-20 NOTE — Patient Instructions (Addendum)
Moderate persistent asthma - well- controlled CONTINUE Advair 500-50 ONE puff TWICE a day CONTINUE Albuterol or nebulizer AS NEEDED Discontinue Dupixent Discussed being up-to-date vaccinations: COVID, influenza (today), RSV, pneumococcal  Severe OSA: Fatigue, daytime sleepiness Declined PAP titration study  Severe Nausea Motion sickness - denies room spinning REFER to Neurology  Follow-up with me in 4 months

## 2021-11-20 NOTE — Progress Notes (Signed)
Subjective:   PATIENT ID: Becky [name] GENDER: female DOB: Jul 03, 1950, MRN: [id number]   HPI  Chief Complaint  Patient presents with   Follow-up    asthma    Reason for Visit: Follow-up   [name] is a 71 year old female with hx CVA, hx melanoma, HTN, CKD who presents with shortness of breath.  Synopsis: 2022 -She was diagnosed with COVID-19 in July 2022. Denies prior history of asthma or frequent respiratory infections.  2023 - March and May had outpatient exacerbation and hospitalization in July. Flovent>Advair. Improved control of symptoms.  11/20/21 Since our last visit she reports she is doing well on the [place]. Not interested in [place] since symptoms are well-controlled. Denies cough, shortness of breath or wheezing.   Reports episodes of nausea that can last several hours until she falls asleep. Sometimes has motion sickness unclear if associated. Denies ringing in ear, denies photosensitivity or sensitivity to noise. Maybe balance issues but minimal. Denies new visual disturbances (chronic floaters). History of migraines in the past but not similar to her prior episodes.  Asthma Control Test ACT Total Score  11/20/2021  2:11 PM 19  09/11/2021  2:56 PM 9   2023 Jan Feb Mar April May June July Aug Sept Oct Nov Dec     X  X X        2024 Jan Feb Mar April May June July Aug Sept Oct Nov Dec                2025 Jan Feb Mar April May June July Aug Sept Oct Nov Dec                     09/11/2021    3:24 PM  Results of the Epworth flowsheet  Sitting and reading 2  Watching TV 1  Sitting, inactive in a public place (e.g. a theatre or a meeting) 0  As a passenger in a car for an hour without a break 1  Lying down to rest in the afternoon when circumstances permit 1  Sitting and talking to someone 0  Sitting quietly after a lunch without alcohol 0  In a car, while stopped for a few minutes in traffic 0  Total score 5    Social History: Never  smoker Childhood second hand smoke exposure  Past Medical History:  Diagnosis Date   Chronic kidney disease    CVA (cerebral vascular accident) ([place]) 11/14/2013   Depression    followed by Dr Toy Care   Falls frequently 10/01/2015   Genital herpes    History of melanoma    followed by derm Dr Delman Cheadle   Hyperlipidemia    Hypertension    MCI (mild cognitive impairment) 10/01/2015   Pure hypercholesterolemia 11/14/2013   Stroke [facility])    Old stroke seen on MRI 12/2009, carotid doppler w minimal plaque, MRI off the head otherwise neg for acute disease.   Thyroid disease    hypothyroid. Followed by Dr Toy Care   TIA (transient ischemic attack) 8/08   followed by neurologist Dr Leonie Man     Family History  Problem Relation Age of Onset   Breast cancer Mother    Hyperlipidemia Father    Hypertension Father    Heart attack Father    Hypertension Sister    Hyperlipidemia Sister    Dementia Maternal Aunt      Social History   Occupational History   Not on file  Tobacco Use  Smoking status: Never   Smokeless tobacco: Never  Vaping Use   Vaping Use: Never used  Substance and Sexual Activity   Alcohol use: No   Drug use: No   Sexual activity: Never    Allergies  Allergen Reactions   Penicillins Anaphylaxis   Iodinated Contrast Media Other (See Comments)   Iohexol Other (See Comments)     Desc: iodine    Nsaids Other (See Comments)    CKD      Outpatient Medications Prior to Visit  Medication Sig Dispense Refill   albuterol (ACCUNEB) 0.63 MG/3ML nebulizer solution Take 3 mLs (0.63 mg total) by nebulization every 6 (six) hours as needed for wheezing. 120 mL 12   albuterol (PROVENTIL) (2.5 MG/3ML) 0.083% nebulizer solution take THREE mls by NEBULIZER EVERY 6 HOURS AS NEEDED FOR SHORTNESS OF BREATH OR wheezing 90 mL 3   albuterol (VENTOLIN HFA) 108 (90 Base) MCG/ACT inhaler Inhale 1 puff into the lungs every 4 (four) hours as needed for wheezing.     ALPRAZolam (XANAX) 0.25 MG  tablet Take 0.25 mg by mouth 3 (three) times daily as needed for anxiety or sleep.  1   amLODipine (NORVASC) 5 MG tablet Take 5 mg by mouth daily.      ARIPiprazole (ABILIFY) 5 MG tablet Take 5 mg by mouth daily.     aspirin EC 325 MG tablet Take 325 mg by mouth 2 (two) times daily.      desvenlafaxine (PRISTIQ) 100 MG 24 hr tablet Take 100 mg by mouth daily.     desvenlafaxine (PRISTIQ) 25 MG 24 hr tablet Take 25 mg by mouth daily. Take with '100mg'$  tab     Dexmethylphenidate HCl 40 MG CP24 Take 40 mg daily by mouth.     fluticasone-salmeterol (ADVAIR DISKUS) 500-50 MCG/ACT AEPB Inhale 1 puff into the lungs in the morning and at bedtime. 60 each 5   hydrochlorothiazide (HYDRODIURIL) 25 MG tablet Take 25 mg by mouth every morning.     levothyroxine (SYNTHROID, LEVOTHROID) 25 MCG tablet Take 25 mcg by mouth daily before breakfast.     montelukast (SINGULAIR) 10 MG tablet Take 1 tablet (10 mg total) by mouth at bedtime. 30 tablet 3   nadolol (CORGARD) 20 MG tablet TAKE ONE TABLET BY MOUTH ONCE DAILY 90 tablet 1   omeprazole (PRILOSEC) 40 MG capsule Take 1 capsule (40 mg total) by mouth every morning. 30 capsule 3   pantoprazole (PROTONIX) 40 MG tablet Take 40 mg by mouth 2 (two) times daily.     potassium chloride (KLOR-CON M) 10 MEQ tablet Take 2 tablets (20 mEq total) by mouth daily. 30 tablet 2   pramipexole (MIRAPEX) 0.125 MG tablet Take 0.125 mg by mouth at bedtime.  11   pravastatin (PRAVACHOL) 40 MG tablet Take 40 mg by mouth daily.     telmisartan (MICARDIS) 80 MG tablet Take 80 mg by mouth daily.     azithromycin (ZITHROMAX) 250 MG tablet Take 1 tablet (250 mg total) by mouth daily. 6 tablet 0   No facility-administered medications prior to visit.    Review of Systems  Constitutional:  Positive for malaise/fatigue. Negative for chills, diaphoresis, fever and weight loss.  HENT:  Negative for congestion.   Respiratory:  Negative for cough, hemoptysis, sputum production, shortness of  breath and wheezing.   Cardiovascular:  Negative for chest pain, palpitations and leg swelling.  Gastrointestinal:  Positive for nausea.     Objective:   Vitals:   11/20/21  1415  BP: (!) 142/84  Pulse: (!) 122  SpO2: 96%  Weight: 203 lb (92.1 kg)  Height: 5' (1.524 m)   SpO2: 96 % O2 Device: None (Room air)  Physical Exam: General: Well-appearing, no acute distress HENT: [place], AT Eyes: EOMI, no scleral icterus Respiratory: Clear to auscultation bilaterally.  No crackles, wheezing or rales Cardiovascular: RRR, -M/R/G, no JVD Extremities:-Edema,-tenderness Neuro: AAO x4, CNII-XII grossly intact Psych: Normal mood, normal affect   Data Reviewed:  Imaging: CXR 03/15/21 - Lingular scarring. No infiltrate, effusion or edema  PFT: 05/06/21 FVC 1.88 (74%) FEV1 1.5 (79%) Ratio 81  TLC 127% DLCO 89% Interpretation: No obstructive defect on spirometry however air trapping present and F-V curves suggestive of obstructive defect/small airway disease. Normal gas exchange  Labs: CBC    Component Value Date/Time   WBC 15.0 (H) 08/18/2021 0727   RBC 3.84 (L) 08/18/2021 0727   HGB 12.3 08/18/2021 0727   HGB 13.0 06/30/2013 1300   HCT 36.6 08/18/2021 0727   HCT 38.7 06/30/2013 1300   PLT 281 08/18/2021 0727   PLT 341 06/30/2013 1300   MCV 95.3 08/18/2021 0727   MCV 89 06/30/2013 1300   MCH 32.0 08/18/2021 0727   MCHC 33.6 08/18/2021 0727   RDW 13.3 08/18/2021 0727   RDW 13.4 06/30/2013 1300   LYMPHSABS 1.2 08/17/2021 1737   LYMPHSABS 2.7 06/30/2013 1300   MONOABS 0.6 08/17/2021 1737   MONOABS 1.1 (H) 06/30/2013 1300   EOSABS 0.0 08/17/2021 1737   EOSABS 0.2 06/30/2013 1300   BASOSABS 0.0 08/17/2021 1737   BASOSABS 0.1 06/30/2013 1300   Abs eos 08/12/21 -300 IgE 08/12/21 -131    Assessment & Plan:   Discussion: 71 year old female never smoker with hx COVID 08/2020 followed by post-viral asthma, hx melanoma, HTN, CKD who presents for follow-up. Asthma symptoms improved on  high dose Advair. Biologic not indicated at this time. Fatigue symptoms also consistent with uncontrolled OSA, uncontrolled, however declines PAP study.  COVID-19 long hauler manifesting in dyspnea Moderate persistent asthma - well- controlled CONTINUE Advair 500-50 ONE puff TWICE a day CONTINUE Albuterol or nebulizer AS NEEDED Discontinue Dupixent Discussed being up-to-date vaccinations: COVID, influenza (today), RSV, pneumococcal  Severe OSA: Fatigue, daytime sleepiness Declined PAP titration study  Severe Nausea Motion sickness - denies room spinning REFER to Neurology  Health Maintenance Immunization History  Administered Date(s) Administered   Influenza Split 12/30/2020   Influenza, High Dose Seasonal PF 11/05/2016, 12/09/2017, 12/16/2018, 12/29/2019   Influenza,inj,quad, With Preservative 10/19/2014, 10/22/2015   Moderna Sars-Covid-2 Vaccination 02/10/2020   PFIZER(Purple Top)SARS-COV-2 Vaccination 05/14/2019, 06/04/2019   Pneumococcal Conjugate-13 11/05/2016   Pneumococcal Polysaccharide-23 12/25/2017   Td 10/19/2014   Zoster Recombinat (Shingrix) 12/01/2017, 11/19/2018   Zoster, Live 12/01/2017, 11/19/2018   CT Lung Screen - not qualified. Never smoker  Orders Placed This Encounter  Procedures   Flu Vaccine QUAD High Dose(Fluad)   Ambulatory referral to Neurology    Referral Priority:   Routine    Referral Type:   Consultation    Referral Reason:   Specialty Services Required    Requested Specialty:   Neurology    Number of Visits Requested:   1   No orders of the defined types were placed in this encounter.   Return in about 4 months (around 03/22/2022).  I have spent a total time of 30-minutes on the day of the appointment including chart review, data review, collecting history, coordinating care and discussing medical diagnosis and plan with  the patient/family. Past medical history, allergies, medications were reviewed. Pertinent imaging, labs and tests  included in this note have been reviewed and interpreted independently by me.  Taney, MD [facility] 11/20/2021 2:33 PM  Office Number [phone]

## 2021-11-23 ENCOUNTER — Encounter: Payer: Self-pay | Admitting: Pulmonary Disease

## 2021-12-03 ENCOUNTER — Telehealth: Payer: Self-pay | Admitting: Pulmonary Disease

## 2021-12-04 NOTE — Telephone Encounter (Signed)
12/04/2021 01:57 PM EDT by Rosanne Gutting T 12/04/2021 01:57 PM EDT by Rosanne Gutting T  Incoming [name] "CATHY" (Self) [phone] (Mobile)[phone] (Mobile) [phone] (Mobile)   - Communicated - pt called back in stating to disregard the previous message put in today stating the transfer of careto a [place] md for her headaches

## 2021-12-27 ENCOUNTER — Other Ambulatory Visit: Payer: Self-pay | Admitting: Pulmonary Disease

## 2022-01-07 DIAGNOSIS — I10 Essential (primary) hypertension: Secondary | ICD-10-CM | POA: Diagnosis not present

## 2022-01-07 DIAGNOSIS — E039 Hypothyroidism, unspecified: Secondary | ICD-10-CM | POA: Diagnosis not present

## 2022-01-07 DIAGNOSIS — K219 Gastro-esophageal reflux disease without esophagitis: Secondary | ICD-10-CM | POA: Diagnosis not present

## 2022-01-07 DIAGNOSIS — N189 Chronic kidney disease, unspecified: Secondary | ICD-10-CM | POA: Diagnosis not present

## 2022-01-07 DIAGNOSIS — E7800 Pure hypercholesterolemia, unspecified: Secondary | ICD-10-CM | POA: Diagnosis not present

## 2022-01-07 DIAGNOSIS — J4541 Moderate persistent asthma with (acute) exacerbation: Secondary | ICD-10-CM | POA: Diagnosis not present

## 2022-01-29 ENCOUNTER — Ambulatory Visit: Payer: Medicare Other | Admitting: Neurology

## 2022-01-29 ENCOUNTER — Telehealth: Payer: Self-pay | Admitting: Neurology

## 2022-01-29 VITALS — BP 151/80 | HR 104 | Ht 60.0 in | Wt 199.0 lb

## 2022-01-29 DIAGNOSIS — G4452 New daily persistent headache (NDPH): Secondary | ICD-10-CM

## 2022-01-29 DIAGNOSIS — R519 Headache, unspecified: Secondary | ICD-10-CM | POA: Diagnosis not present

## 2022-01-29 DIAGNOSIS — R110 Nausea: Secondary | ICD-10-CM

## 2022-01-29 DIAGNOSIS — G3184 Mild cognitive impairment, so stated: Secondary | ICD-10-CM

## 2022-01-29 MED ORDER — TOPIRAMATE 25 MG PO TABS: 25.0000 mg | ORAL_TABLET | Freq: Two times a day (BID) | ORAL | 3 refills | Status: DC

## 2022-01-29 NOTE — Telephone Encounter (Signed)
[facility] Medicare [place] sent to GI [phone]

## 2022-01-29 NOTE — Progress Notes (Signed)
[facility] [address] [place] [address] [phone]       OFFICE CONSULT NOTE  Becky [name] Date of Birth:  Mar 30, 1950 Medical Record Number:  [id number]   Referring MD: Rodman Pickle  Reason for Referral: Daily headaches  HPI: [name] is a 71 year old pleasant Caucasian lady seen today for consultation visit for headache.  History is obtained from the patient and review of electronic medical records and I reviewed pertinent available imaging films in PACS.  She has longstanding history of anxiety depression, chronic kidney disease, hypertension, hyperlipidemia old stroke in October 2011, TIA and hypothyroidism.  Patient states that she had COVID illness in July 2022 following which she has had daily headaches as well as nausea which is unrelenting and not going away.  Describes the headache as being bifrontal constant.  He describes the quality activity vomiting.  The headache is moderate 5-6/10 in severity.  She has constant nausea.  She does not vomit.  The headache increases with physical activity and she is unable to continue with work he has to lie down.  She feels better when she sleeps.  She has been taking 2 tablets of over-the-counter headache pills on a daily basis but to provide only short-term benefit.  She has not taken any daily headache prophylactic medications.  She has not had any brain imaging done.  She denies any visual symptoms, blurred vision, focal extremity weakness and minutes.  She does have longstanding history of mild short-term memory difficulties .  She was seen last in office in April 2021 by Janett Billow nurse practitioner.  He states that short-term memory and cognitive difficulties are unchanged and nonprogressive.  She has not been participating in any regular cognitively challenging activities . Prior visit 10/01/2015 : Ms Becky [name] is a 17 year lady with long-standing history of anxiety depression who for the last 1 year or so has  had progressive memory and cognitive difficulties as well as increased frequency of falls and balance problems. She is accompanied today by a cousin [name]. Patient admits she did have some mild cognitive memory problems even prior to a year but since she got ECT in June 2016 this seems to have been more pronounced. She got only 4 treatments every other day. 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 She gives several examples like going to plan to change direct drops on her checking account but once she reached that she was unable to tell the bank arrest why she had come. On occasion while driving home from a friend's house she could not remember the way home and got lost. 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 She denies any headaches, extremity weakness. She is also had increased frequency of falls and states that she falls without any warning. She does not feel like she didn't pass out or have any syncopal symptoms. She falls all of a sudden. She is able to stop herself sometimes but has bruised herself. She has never lost consciousness. She denies any tingling numbness pain or burning in her feet. She denies walking like a drunk. She has no history of significant head injury with loss of consciousness. She has no family history of dementia. She lives alone she is now getting some help with finances but needs to keep a  calendar calendar to write things down so she does not forget. She has a remote history of tiny (lacunar infarct in  September 2008. MRA of the brain showed only mild atherosclerotic changes She presented at that time the 30 minute episode of right-sided facial numbness and slurred speech. I had seen her in the hospital at that time but she was lost to follow-up. She states she's had no further recurrent stroke or TIA  symptoms.  Last visit 06/29/2019 : [name] returns for follow-up regarding mild cognitive impairment with prior stroke history.  Initial evaluation with Dr. Leonie Man in 09/2015 due to progressive memory and cognitive difficulties along with increase frequency of falls and balance problems.  Extensive imaging and lab work-up unremarkable for any reversible causes.  Memory has been stable since prior visit 1 year ago with Dr. Leonie Man without worsening.  No reoccurring gait or balance concerns.  She denies recurrent stroke or TIA symptoms since 2008.  Continues on aspirin for secondary stroke prevention.  Crestor currently on hold due to elevated liver enzymes currently being followed by [facility] gastro[facility]ology and PCP who plans on repeat lab work around June/July.  Blood pressure today 137/85.  No concerns at this time.  ROS:   14 system review of systems is positive for shortness of breath, headache, nausea all other systems negative  PMH:  Past Medical History:  Diagnosis Date   Chronic kidney disease    CVA (cerebral vascular accident) ([place]) 11/14/2013   Depression    followed by Dr Waverly Ferrari frequently 10/01/2015   Genital herpes    History of melanoma    followed by derm Dr Delman Cheadle   Hyperlipidemia    Hypertension    MCI (mild cognitive impairment) 10/01/2015   Pure hypercholesterolemia 11/14/2013   Stroke [facility])    Old stroke seen on MRI 12/2009, carotid doppler w minimal plaque, MRI off the head otherwise neg for acute disease.   Thyroid disease    hypothyroid. Followed by Dr Toy Care   TIA (transient ischemic attack) 8/08   followed by neurologist Dr Leonie Man    Social History:  Social History   Socioeconomic History   Marital status: Single    Spouse name: Not on file   Number of children: Not on file   Years of education: Not on file   Highest education level: Not on file  Occupational History   Not on file  Tobacco Use   Smoking status: Never   Smokeless tobacco: Never  Vaping  Use   Vaping Use: Never used  Substance and Sexual Activity   Alcohol use: No   Drug use: No   Sexual activity: Never  Other Topics Concern   Not on file  Social History Narrative   Not on file   Social Determinants of Health   Financial Resource Strain: Not on file  Food Insecurity: Not on file  Transportation Needs: Not on file  Physical Activity: Not on file  Stress: Not on file  Social Connections: Not on file  Intimate Partner Violence: Not on file    Medications:   Current Outpatient Medications on File Prior to Visit  Medication Sig Dispense Refill   albuterol (ACCUNEB) 0.63 MG/3ML nebulizer solution Take 3 mLs (0.63 mg total) by nebulization every 6 (six) hours as needed for wheezing. 120 mL 12   albuterol (PROVENTIL) (2.5 MG/3ML) 0.083% nebulizer solution take THREE mls by NEBULIZER EVERY 6 HOURS AS NEEDED FOR SHORTNESS OF BREATH OR wheezing 90 mL  3   albuterol (VENTOLIN HFA) 108 (90 Base) MCG/ACT inhaler Inhale 1 puff into the lungs every 4 (four) hours as needed for wheezing.     ALPRAZolam (XANAX) 0.25 MG tablet Take 0.25 mg by mouth 3 (three) times daily as needed for anxiety or sleep.  1   amLODipine (NORVASC) 5 MG tablet Take 5 mg by mouth daily.      ARIPiprazole (ABILIFY) 5 MG tablet Take 5 mg by mouth daily.     aspirin EC 325 MG tablet Take 325 mg by mouth 2 (two) times daily.      desvenlafaxine (PRISTIQ) 100 MG 24 hr tablet Take 100 mg by mouth daily.     desvenlafaxine (PRISTIQ) 25 MG 24 hr tablet Take 25 mg by mouth daily. Take with '100mg'$  tab     Dexmethylphenidate HCl 40 MG CP24 Take 40 mg daily by mouth.     fluticasone-salmeterol (ADVAIR DISKUS) 500-50 MCG/ACT AEPB Inhale 1 puff into the lungs in the morning and at bedtime. 60 each 5   hydrochlorothiazide (HYDRODIURIL) 25 MG tablet Take 25 mg by mouth every morning.     levothyroxine (SYNTHROID, LEVOTHROID) 25 MCG tablet Take 25 mcg by mouth daily before breakfast.     montelukast (SINGULAIR) 10 MG  tablet TAKE ONE TABLET BY MOUTH EVERYDAY AT BEDTIME 90 tablet 0   nadolol (CORGARD) 20 MG tablet TAKE ONE TABLET BY MOUTH ONCE DAILY 90 tablet 1   omeprazole (PRILOSEC) 40 MG capsule Take 1 capsule (40 mg total) by mouth every morning. 30 capsule 3   pantoprazole (PROTONIX) 40 MG tablet Take 40 mg by mouth 2 (two) times daily.     potassium chloride (KLOR-CON M) 10 MEQ tablet Take 2 tablets (20 mEq total) by mouth daily. 30 tablet 2   pramipexole (MIRAPEX) 0.125 MG tablet Take 0.125 mg by mouth at bedtime.  11   pravastatin (PRAVACHOL) 40 MG tablet Take 40 mg by mouth daily.     telmisartan (MICARDIS) 80 MG tablet Take 80 mg by mouth daily.     No current facility-administered medications on file prior to visit.    Allergies:   Allergies  Allergen Reactions   Penicillins Anaphylaxis   Iodinated Contrast Media Other (See Comments)   Iohexol Other (See Comments)     Desc: iodine    Nsaids Other (See Comments)    CKD     Physical Exam General:    Pleasant elderly Caucasian lady seated, in no evident distress Head: head normocephalic and atraumatic.   Neck: supple with no carotid or supraclavicular bruits Cardiovascular: regular rate and rhythm, no murmurs Musculoskeletal: no deformity Skin:  no rash/petichiae Vascular:  Normal pulses all extremities  Neurologic Exam Mental Status: Awake and fully alert. Oriented to place and time. Recent and remote memory intact. Attention span, concentration and fund of knowledge appropriate. Mood and affect appropriate.  Diminished recall 1/3.  Able to name 8 animals which can walk on 4 legs.  Clock drawing 4/4. Cranial Nerves: Fundoscopic exam reveals sharp disc margins. Pupils equal, briskly reactive to light. Extraocular movements full without nystagmus but dysmetric saccades when looking to the left. Visual fields full to confrontation. Hearing intact. Facial sensation intact. Face, tongue, palate moves normally and symmetrically.  Motor:  Normal bulk and tone. Normal strength in all tested extremity muscles. Sensory.: intact to touch , pinprick , position and vibratory sensation.  Coordination: Rapid alternating movements normal in all extremities. Finger-to-nose and heel-to-shin performed accurately bilaterally. Gait and Station:  Arises from chair without difficulty. Stance is normal. Gait demonstrates normal stride length and balance . Able to heel, toe and tandem walk with moderate t difficulty.  Reflexes: 1+ and symmetric. Toes downgoing.       ASSESSMENT: 71 year old Caucasian lady with new onset daily persistent headache following COVID illness in July 2022 likely muscle tension headache with analgesic rebound longstanding history of mild cognitive impairment which appears stable     PLAN:I had a long discussion with the patient regarding her nudity persistent headache following COVID which is likely tension headache.  I recommend we try Topamax 25 mg twice daily increase as tolerated without side effects. Equate daily as this may be contributing to analgesic rebound.  I also encouraged her to do regular neck stretching exercises as well as increased participation in regular activities for stress relaxation like meditation, yoga MRI scan of the brain with structurally also advised.  Participating in cognitively challenging like solving crossword puzzles, playing bridge and sudoku for mild cognitive impairment which appears to be stable.  She will return for follow-up in the future in 3 months with nurse practitioner Janett Billow or call earlier if needed.  Greater than 50% time during this 45-minute consultation visit was spent in counseling and coordination of care about her daily headaches as well as mild cognitive impairment and answering questions Antony Contras, MD  Note: This document was prepared with digital dictation and possible smart phrase technology. Any transcriptional errors that result from this process are  unintentional.

## 2022-01-29 NOTE — Patient Instructions (Signed)
I had a long discussion with the patient regarding her nudity persistent headache following COVID which is likely tension headache.  I recommend we try Topamax 25 mg twice daily as tolerated Side effects. Equate daily as this may be contributing to analgesic rebound.  I also encouraged her to do regular neck stretching exercises as well as increased participation in regular activities for stress relaxation like meditation, yoga MRI scan of the brain with structurally also advised.  Participating in cognitively challenging like solving crossword puzzles, playing bridge and sudoku for mild cognitive impairment which appears to be stable.  She will return for follow-up in the future in 3 months with nurse practitioner Janett Billow or call earlier if needed.  Neck Exercises Ask your health care provider which exercises are safe for you. Do exercises exactly as told by your health care provider and adjust them as directed. It is normal to feel mild stretching, pulling, tightness, or discomfort as you do these exercises. Stop right away if you feel sudden pain or your pain gets worse. Do not begin these exercises until told by your health care provider. Neck exercises can be important for many reasons. They can improve strength and maintain flexibility in your neck, which will help your upper back and prevent neck pain. Stretching exercises Rotation neck stretching  Sit in a chair or stand up. Place your feet flat on the floor, shoulder-width apart. Slowly turn your head (rotate) to the right until a slight stretch is felt. Turn it all the way to the right so you can look over your right shoulder. Do not tilt or tip your head. Hold this position for 10-30 seconds. Slowly turn your head (rotate) to the left until a slight stretch is felt. Turn it all the way to the left so you can look over your left shoulder. Do not tilt or tip your head. Hold this position for 10-30 seconds. Repeat __________ times. Complete  this exercise __________ times a day. Neck retraction  Sit in a sturdy chair or stand up. Look straight ahead. Do not bend your neck. Use your fingers to push your chin backward (retraction). Do not bend your neck for this movement. Continue to face straight ahead. If you are doing the exercise properly, you will feel a slight sensation in your throat and a stretch at the back of your neck. Hold the stretch for 1-2 seconds. Repeat __________ times. Complete this exercise __________ times a day. Strengthening exercises Neck press  Lie on your back on a firm bed or on the floor with a pillow under your head. Use your neck muscles to push your head down on the pillow and straighten your spine. Hold the position as well as you can. Keep your head facing up (in a neutral position) and your chin tucked. Slowly count to 5 while holding this position. Repeat __________ times. Complete this exercise __________ times a day. Isometrics These are exercises in which you strengthen the muscles in your neck while keeping your neck still (isometrics). Sit in a supportive chair and place your hand on your forehead. Keep your head and face facing straight ahead. Do not flex or extend your neck while doing isometrics. Push forward with your head and neck while pushing back with your hand. Hold for 10 seconds. Do the sequence again, this time putting your hand against the back of your head. Use your head and neck to push backward against the hand pressure. Finally, do the same exercise on either side of  your head, pushing sideways against the pressure of your hand. Repeat __________ times. Complete this exercise __________ times a day. Prone head lifts  Lie face-down (prone position), resting on your elbows so that your chest and upper back are raised. Start with your head facing downward, near your chest. Position your chin either on or near your chest. Slowly lift your head upward. Lift until you are  looking straight ahead. Then continue lifting your head as far back as you can comfortably stretch. Hold your head up for 5 seconds. Then slowly lower it to your starting position. Repeat __________ times. Complete this exercise __________ times a day. Supine head lifts  Lie on your back (supine position), bending your knees to point to the ceiling and keeping your feet flat on the floor. Lift your head slowly off the floor, raising your chin toward your chest. Hold for 5 seconds. Repeat __________ times. Complete this exercise __________ times a day. Scapular retraction  Stand with your arms at your sides. Look straight ahead. Slowly pull both shoulders (scapulae) backward and downward (retraction) until you feel a stretch between your shoulder blades in your upper back. Hold for 10-30 seconds. Relax and repeat. Repeat __________ times. Complete this exercise __________ times a day. Contact a health care provider if: Your neck pain or discomfort gets worse when you do an exercise. Your neck pain or discomfort does not improve within 2 hours after you exercise. If you have any of these problems, stop exercising right away. Do not do the exercises again unless your health care provider says that you can. Get help right away if: You develop sudden, severe neck pain. If this happens, stop exercising right away. Do not do the exercises again unless your health care provider says that you can. This information is not intended to replace advice given to you by your health care provider. Make sure you discuss any questions you have with your health care provider. Document Revised: 08/14/2020 Document Reviewed: 08/14/2020 Elsevier Patient Education  [place].

## 2022-01-31 DIAGNOSIS — R2689 Other abnormalities of gait and mobility: Secondary | ICD-10-CM | POA: Diagnosis not present

## 2022-01-31 DIAGNOSIS — N189 Chronic kidney disease, unspecified: Secondary | ICD-10-CM | POA: Diagnosis not present

## 2022-01-31 DIAGNOSIS — I10 Essential (primary) hypertension: Secondary | ICD-10-CM | POA: Diagnosis not present

## 2022-01-31 DIAGNOSIS — R413 Other amnesia: Secondary | ICD-10-CM | POA: Diagnosis not present

## 2022-01-31 DIAGNOSIS — J4541 Moderate persistent asthma with (acute) exacerbation: Secondary | ICD-10-CM | POA: Diagnosis not present

## 2022-01-31 DIAGNOSIS — K219 Gastro-esophageal reflux disease without esophagitis: Secondary | ICD-10-CM | POA: Diagnosis not present

## 2022-01-31 DIAGNOSIS — E039 Hypothyroidism, unspecified: Secondary | ICD-10-CM | POA: Diagnosis not present

## 2022-01-31 DIAGNOSIS — I679 Cerebrovascular disease, unspecified: Secondary | ICD-10-CM | POA: Diagnosis not present

## 2022-01-31 DIAGNOSIS — E7800 Pure hypercholesterolemia, unspecified: Secondary | ICD-10-CM | POA: Diagnosis not present

## 2022-02-13 ENCOUNTER — Ambulatory Visit (HOSPITAL_BASED_OUTPATIENT_CLINIC_OR_DEPARTMENT_OTHER): Payer: Medicare Other | Admitting: Pulmonary Disease

## 2022-02-14 ENCOUNTER — Ambulatory Visit (HOSPITAL_BASED_OUTPATIENT_CLINIC_OR_DEPARTMENT_OTHER): Payer: Medicare Other | Admitting: Pulmonary Disease

## 2022-04-03 ENCOUNTER — Ambulatory Visit (HOSPITAL_BASED_OUTPATIENT_CLINIC_OR_DEPARTMENT_OTHER): Payer: Medicare Other | Admitting: Pulmonary Disease

## 2022-04-24 ENCOUNTER — Ambulatory Visit (HOSPITAL_BASED_OUTPATIENT_CLINIC_OR_DEPARTMENT_OTHER): Payer: Medicare Other | Admitting: Pulmonary Disease

## 2022-04-24 DIAGNOSIS — K219 Gastro-esophageal reflux disease without esophagitis: Secondary | ICD-10-CM | POA: Diagnosis not present

## 2022-04-24 DIAGNOSIS — N183 Chronic kidney disease, stage 3 unspecified: Secondary | ICD-10-CM | POA: Diagnosis not present

## 2022-04-24 DIAGNOSIS — R413 Other amnesia: Secondary | ICD-10-CM | POA: Diagnosis not present

## 2022-04-24 DIAGNOSIS — E039 Hypothyroidism, unspecified: Secondary | ICD-10-CM | POA: Diagnosis not present

## 2022-04-24 DIAGNOSIS — Z0000 Encounter for general adult medical examination without abnormal findings: Secondary | ICD-10-CM | POA: Diagnosis not present

## 2022-04-24 DIAGNOSIS — R7301 Impaired fasting glucose: Secondary | ICD-10-CM | POA: Diagnosis not present

## 2022-04-24 DIAGNOSIS — J4541 Moderate persistent asthma with (acute) exacerbation: Secondary | ICD-10-CM | POA: Diagnosis not present

## 2022-04-24 DIAGNOSIS — I10 Essential (primary) hypertension: Secondary | ICD-10-CM | POA: Diagnosis not present

## 2022-04-24 DIAGNOSIS — E7800 Pure hypercholesterolemia, unspecified: Secondary | ICD-10-CM | POA: Diagnosis not present

## 2022-04-24 DIAGNOSIS — G2581 Restless legs syndrome: Secondary | ICD-10-CM | POA: Diagnosis not present

## 2022-04-24 DIAGNOSIS — I679 Cerebrovascular disease, unspecified: Secondary | ICD-10-CM | POA: Diagnosis not present

## 2022-04-25 ENCOUNTER — Encounter (HOSPITAL_BASED_OUTPATIENT_CLINIC_OR_DEPARTMENT_OTHER): Payer: Self-pay | Admitting: Pulmonary Disease

## 2022-04-25 ENCOUNTER — Ambulatory Visit (HOSPITAL_BASED_OUTPATIENT_CLINIC_OR_DEPARTMENT_OTHER): Payer: Medicare Other | Admitting: Pulmonary Disease

## 2022-04-25 VITALS — BP 140/90 | HR 105 | Ht 60.0 in | Wt 198.9 lb

## 2022-04-25 DIAGNOSIS — R29898 Other symptoms and signs involving the musculoskeletal system: Secondary | ICD-10-CM | POA: Diagnosis not present

## 2022-04-25 DIAGNOSIS — R42 Dizziness and giddiness: Secondary | ICD-10-CM | POA: Diagnosis not present

## 2022-04-25 DIAGNOSIS — U099 Post covid-19 condition, unspecified: Secondary | ICD-10-CM

## 2022-04-25 DIAGNOSIS — E7800 Pure hypercholesterolemia, unspecified: Secondary | ICD-10-CM | POA: Diagnosis not present

## 2022-04-25 DIAGNOSIS — I10 Essential (primary) hypertension: Secondary | ICD-10-CM | POA: Diagnosis not present

## 2022-04-25 DIAGNOSIS — E039 Hypothyroidism, unspecified: Secondary | ICD-10-CM | POA: Diagnosis not present

## 2022-04-25 DIAGNOSIS — R0609 Other forms of dyspnea: Secondary | ICD-10-CM | POA: Diagnosis not present

## 2022-04-25 DIAGNOSIS — J4541 Moderate persistent asthma with (acute) exacerbation: Secondary | ICD-10-CM | POA: Diagnosis not present

## 2022-04-25 DIAGNOSIS — N183 Chronic kidney disease, stage 3 unspecified: Secondary | ICD-10-CM | POA: Diagnosis not present

## 2022-04-25 DIAGNOSIS — M8580 Other specified disorders of bone density and structure, unspecified site: Secondary | ICD-10-CM | POA: Diagnosis not present

## 2022-04-25 DIAGNOSIS — J4540 Moderate persistent asthma, uncomplicated: Secondary | ICD-10-CM | POA: Diagnosis not present

## 2022-04-25 MED ORDER — FLUTICASONE-SALMETEROL 500-50 MCG/ACT IN AEPB: 1.0000 | INHALATION_SPRAY | Freq: Two times a day (BID) | RESPIRATORY_TRACT | 11 refills | Status: DC

## 2022-04-25 NOTE — Progress Notes (Unsigned)
Subjective:   PATIENT ID: Becky [name] GENDER: female DOB: [birth year], MRN: [id number]   HPI  Chief Complaint  Patient presents with   Follow-up    Doing so so    Reason for Visit: Follow-up   Becky [name] is a 72 year old female with hx CVA, hx melanoma, HTN, CKD who presents with shortness of breath.  Synopsis: 2022 -She was diagnosed with COVID-19 in July 2022. Denies prior history of asthma or frequent respiratory infections.  2023 - March and May had outpatient exacerbation and hospitalization in July. Flovent>Advair. Improved control of symptoms.  04/25/22 Since our last visit she is compliant with Advair 500-50. Denies exacerbations since our last visit. She reports she has been sick and nauseated due to dizziness. Able to perform most ADLs however she reports that she has avoided showering due to concern for falls due to dizziness. Had been seen by Neurology. Feels limited in her resources at home.   Asthma Control Test ACT Total Score  04/25/2022  3:27 PM 17  11/20/2021  2:11 PM 19  09/11/2021  2:56 PM 9   2023 Jan Feb Mar April May June July Aug Sept Oct Nov Dec     X  X X        2024 Jan Feb Mar April May June July Aug Sept Oct Nov Dec                2025 Jan Feb Mar April May June July Aug Sept Oct Nov Dec                     09/11/2021    3:24 PM  Results of the Epworth flowsheet  Sitting and reading 2  Watching TV 1  Sitting, inactive in a public place (e.g. a theatre or a meeting) 0  As a passenger in a car for an hour without a break 1  Lying down to rest in the afternoon when circumstances permit 1  Sitting and talking to someone 0  Sitting quietly after a lunch without alcohol 0  In a car, while stopped for a few minutes in traffic 0  Total score 5    Social History: Never smoker Childhood second hand smoke exposure  Past Medical History:  Diagnosis Date   Chronic kidney disease    CVA (cerebral vascular accident) ([place])  11/14/2013   Depression    followed by Dr Toy Care   Falls frequently 10/01/2015   Genital herpes    History of melanoma    followed by derm Dr Delman Cheadle   Hyperlipidemia    Hypertension    MCI (mild cognitive impairment) 10/01/2015   Pure hypercholesterolemia 11/14/2013   Stroke [facility])    Old stroke seen on MRI 12/2009, carotid doppler w minimal plaque, MRI off the head otherwise neg for acute disease.   Thyroid disease    hypothyroid. Followed by Dr Toy Care   TIA (transient ischemic attack) 8/08   followed by neurologist Dr Leonie Man     Family History  Problem Relation Age of Onset   Breast cancer Mother    Hyperlipidemia Father    Hypertension Father    Heart attack Father    Hypertension Sister    Hyperlipidemia Sister    Dementia Maternal Aunt      Social History   Occupational History   Not on file  Tobacco Use   Smoking status: Never   Smokeless tobacco: Never  Vaping Use  Vaping Use: Never used  Substance and Sexual Activity   Alcohol use: No   Drug use: No   Sexual activity: Never    Allergies  Allergen Reactions   Penicillins Anaphylaxis   Iodinated Contrast Media Other (See Comments)   Iohexol Other (See Comments)     Desc: iodine    Nsaids Other (See Comments)    CKD      Outpatient Medications Prior to Visit  Medication Sig Dispense Refill   albuterol (ACCUNEB) 0.63 MG/3ML nebulizer solution Take 3 mLs (0.63 mg total) by nebulization every 6 (six) hours as needed for wheezing. 120 mL 12   albuterol (PROVENTIL) (2.5 MG/3ML) 0.083% nebulizer solution take THREE mls by NEBULIZER EVERY 6 HOURS AS NEEDED FOR SHORTNESS OF BREATH OR wheezing 90 mL 3   albuterol (VENTOLIN HFA) 108 (90 Base) MCG/ACT inhaler Inhale 1 puff into the lungs every 4 (four) hours as needed for wheezing.     ALPRAZolam (XANAX) 0.25 MG tablet Take 0.25 mg by mouth 3 (three) times daily as needed for anxiety or sleep.  1   amLODipine (NORVASC) 5 MG tablet Take 5 mg by mouth daily.       ARIPiprazole (ABILIFY) 5 MG tablet Take 5 mg by mouth daily.     aspirin EC 325 MG tablet Take 325 mg by mouth 2 (two) times daily.      desvenlafaxine (PRISTIQ) 100 MG 24 hr tablet Take 100 mg by mouth daily.     desvenlafaxine (PRISTIQ) 25 MG 24 hr tablet Take 25 mg by mouth daily. Take with '100mg'$  tab     Dexmethylphenidate HCl 40 MG CP24 Take 40 mg daily by mouth.     fluticasone-salmeterol (ADVAIR DISKUS) 500-50 MCG/ACT AEPB Inhale 1 puff into the lungs in the morning and at bedtime. 60 each 5   hydrochlorothiazide (HYDRODIURIL) 25 MG tablet Take 25 mg by mouth every morning.     levothyroxine (SYNTHROID, LEVOTHROID) 25 MCG tablet Take 25 mcg by mouth daily before breakfast.     montelukast (SINGULAIR) 10 MG tablet TAKE ONE TABLET BY MOUTH EVERYDAY AT BEDTIME 90 tablet 0   nadolol (CORGARD) 20 MG tablet TAKE ONE TABLET BY MOUTH ONCE DAILY 90 tablet 1   omeprazole (PRILOSEC) 40 MG capsule Take 1 capsule (40 mg total) by mouth every morning. 30 capsule 3   pantoprazole (PROTONIX) 40 MG tablet Take 40 mg by mouth 2 (two) times daily.     potassium chloride (KLOR-CON M) 10 MEQ tablet Take 2 tablets (20 mEq total) by mouth daily. 30 tablet 2   pramipexole (MIRAPEX) 0.125 MG tablet Take 0.125 mg by mouth at bedtime.  11   pravastatin (PRAVACHOL) 40 MG tablet Take 40 mg by mouth daily.     telmisartan (MICARDIS) 80 MG tablet Take 80 mg by mouth daily.     topiramate (TOPAMAX) 25 MG tablet Take 1 tablet (25 mg total) by mouth 2 (two) times daily. 120 tablet 3   No facility-administered medications prior to visit.    Review of Systems  Constitutional:  Positive for malaise/fatigue. Negative for chills, diaphoresis, fever and weight loss.  HENT:  Negative for congestion.   Respiratory:  Negative for cough, hemoptysis, sputum production, shortness of breath and wheezing.   Cardiovascular:  Negative for chest pain, palpitations and leg swelling.  Gastrointestinal:  Positive for nausea.      Objective:   Vitals:   04/25/22 1512  BP: (!) 140/90  Pulse: (!) 105  SpO2: 94%  Weight: 198 lb 13.7 oz (90.2 kg)  Height: 5' (1.524 m)   SpO2: 94 % O2 Device: None (Room air)  Physical Exam: General: Well-appearing, no acute distress HENT: [place], AT Eyes: EOMI, no scleral icterus Respiratory: ***Clear to auscultation bilaterally.  No crackles, wheezing or rales Cardiovascular: RRR, -M/R/G, no JVD Extremities:-Edema,-tenderness Neuro: AAO x4, CNII-XII grossly intact Psych: Normal mood, normal affect   Data Reviewed:  Imaging: CXR 03/15/21 - Lingular scarring. No infiltrate, effusion or edema  PFT: 05/06/21 FVC 1.88 (74%) FEV1 1.5 (79%) Ratio 81  TLC 127% DLCO 89% Interpretation: No obstructive defect on spirometry however air trapping present and F-V curves suggestive of obstructive defect/small airway disease. Normal gas exchange  Labs: CBC    Component Value Date/Time   WBC 15.0 (H) 08/18/2021 0727   RBC 3.84 (L) 08/18/2021 0727   HGB 12.3 08/18/2021 0727   HGB 13.0 06/30/2013 1300   HCT 36.6 08/18/2021 0727   HCT 38.7 06/30/2013 1300   PLT 281 08/18/2021 0727   PLT 341 06/30/2013 1300   MCV 95.3 08/18/2021 0727   MCV 89 06/30/2013 1300   MCH 32.0 08/18/2021 0727   MCHC 33.6 08/18/2021 0727   RDW 13.3 08/18/2021 0727   RDW 13.4 06/30/2013 1300   LYMPHSABS 1.2 08/17/2021 1737   LYMPHSABS 2.7 06/30/2013 1300   MONOABS 0.6 08/17/2021 1737   MONOABS 1.1 (H) 06/30/2013 1300   EOSABS 0.0 08/17/2021 1737   EOSABS 0.2 06/30/2013 1300   BASOSABS 0.0 08/17/2021 1737   BASOSABS 0.1 06/30/2013 1300   Abs eos 08/12/21 -300 IgE 08/12/21 -131    Assessment & Plan:   Discussion: 72 year old female never smoker with hx COVID 08/2020 followed by post-viral asthma, hx melanoma, HTN, CKD who presents for follow-up. Asthma symptoms improved on high dose Advair. Biologic not indicated at this time. Fatigue symptoms also consistent with uncontrolled OSA, however declines  PAP study.  72 year old female never smoker with hx COVID 08/2020 followed by post-viral asthma, hx melanoma, HTN, CKD who presents for follow-up.   COVID-19 long hauler manifesting in dyspnea Moderate persistent asthma - well-controlled CONTINUE Advair 500-50 ONE puff TWICE a day CONTINUE Albuterol or nebulizer AS NEEDED STOP singulair  Severe OSA: Fatigue, daytime sleepiness Declined PAP titration study  Deconditioning --Activity limited by dizziness --REFER to outpatient therapy at [facility]  Dizziness --Scheduled with Neurology. Please call their office regarding timing of MRI --Please call eye doctor for evaluation  Depression --STOP singulair --Currently in therapy  Health Maintenance Immunization History  Administered Date(s) Administered   Fluad Quad(high Dose 65+) 11/20/2021   Influenza Split 12/30/2020   Influenza, High Dose Seasonal PF 11/05/2016, 12/09/2017, 12/16/2018, 12/29/2019   Influenza,inj,quad, With Preservative 10/19/2014, 10/22/2015   Moderna Sars-Covid-2 Vaccination 02/10/2020   PFIZER(Purple Top)SARS-COV-2 Vaccination 05/14/2019, 06/04/2019   Pneumococcal Conjugate-13 11/05/2016   Pneumococcal Polysaccharide-23 12/25/2017   Td 10/19/2014   Zoster Recombinat (Shingrix) 12/01/2017, 11/19/2018   Zoster, Live 12/01/2017, 11/19/2018   CT Lung Screen - not qualified. Never smoker  No orders of the defined types were placed in this encounter.  No orders of the defined types were placed in this encounter.   No follow-ups on file.  I have spent a total time of***-minutes on the day of the appointment including chart review, data review, collecting history, coordinating care and discussing medical diagnosis and plan with the patient/family. Past medical history, allergies, medications were reviewed. Pertinent imaging, labs and tests included in this note have been reviewed and  interpreted independently by me.  [place], MD [facility] 04/25/2022 3:34 PM  Office Number [phone]

## 2022-04-25 NOTE — Patient Instructions (Addendum)
COVID-19 long hauler manifesting in dyspnea Moderate persistent asthma - well-controlled CONTINUE Advair 500-50 ONE puff TWICE a day CONTINUE Albuterol or nebulizer AS NEEDED STOP singulair  Deconditioning --Activity limited by dizziness --REFER to outpatient therapy at [facility]  Dizziness --Scheduled with Neurology. Please call their office regarding timing of MRI --Please call eye doctor for evaluation  Depression --STOP singulair --Currently in therapy  Follow-up with me in 3 months (May)

## 2022-04-29 ENCOUNTER — Encounter (HOSPITAL_BASED_OUTPATIENT_CLINIC_OR_DEPARTMENT_OTHER): Payer: Self-pay | Admitting: Pulmonary Disease

## 2022-05-01 NOTE — Progress Notes (Signed)
[facility] [address] [place]. [place] [address] [phone]       OFFICE FOLLOW UP NOTE  [name] Date of Birth:  September 06, 1950 Medical Record Number:  [id number]   Referring MD: Rodman Pickle  Reason for Referral: Daily headaches   Chief Complaint  Patient presents with   Follow-up    Rm 3 alone Pt is well and stable, reports headaches have improved since last visit. No new concerns.       HPI: Becky [name] is a 72 year old pleasant Caucasian lady seen today for consultation visit for headache.  History is obtained from the patient and review of electronic medical records and I reviewed pertinent available imaging films in PACS.  She has longstanding history of anxiety depression, chronic kidney disease, hypertension, hyperlipidemia old stroke in October 2011, TIA and hypothyroidism.  Patient states that she had COVID illness in July 2022 following which she has had daily headaches as well as nausea which is unrelenting and not going away.  Describes the headache as being bifrontal constant.  He describes the quality activity vomiting.  The headache is moderate 5-6/10 in severity.  She has constant nausea.  She does not vomit.  The headache increases with physical activity and she is unable to continue with work he has to lie down.  She feels better when she sleeps.  She has been taking 2 tablets of over-the-counter headache pills on a daily basis but to provide only short-term benefit.  She has not taken any daily headache prophylactic medications.  She has not had any brain imaging done.  She denies any visual symptoms, blurred vision, focal extremity weakness and minutes.  She does have longstanding history of mild short-term memory difficulties .  She was seen last in office in April 2021 by Becky [name] nurse practitioner.  He states that short-term memory and cognitive difficulties are unchanged and nonprogressive.  She has not been participating in any  regular cognitively challenging activities . Prior visit 10/01/2015 : [name] is a 62 year lady with long-standing history of anxiety depression who for the last 1 year or so has had progressive memory and cognitive difficulties as well as increased frequency of falls and balance problems. She is accompanied today by a cousin Becky [name]. Patient admits she did have some mild cognitive memory problems even prior to a year but since she got ECT in June 2016 this seems to have been more pronounced. She got only 4 treatments every other day. 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 She gives several examples like going to plan to change direct drops on her checking account but once she reached that she was unable to tell the bank arrest why she had come. On occasion while driving home from a friend's house she could not remember the way home and got lost. 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 She denies any headaches, extremity weakness. She is also had increased frequency of falls and states that she falls without any warning. She does not feel like she didn't pass out or have any syncopal symptoms. She falls all of a sudden. She is able to stop herself sometimes but has bruised herself. She has never lost consciousness. She denies any tingling numbness pain or burning in her feet. She denies walking  like a drunk. She has no history of significant head injury with loss of consciousness. She has no family history of dementia. She lives alone she is now getting some help with finances but needs to keep a calendar calendar to write things down so she does not forget. She has a remote history of tiny (lacunar infarct in  September 2008. MRA of the brain showed only mild atherosclerotic changes She presented at that time the 30 minute episode of  right-sided facial numbness and slurred speech. I had seen her in the hospital at that time but she was lost to follow-up. She states she's had no further recurrent stroke or TIA symptoms.  Update 06/29/2019 JM: Becky [name] returns for follow-up regarding mild cognitive impairment with prior stroke history.  Initial evaluation with Dr. Leonie Man in 09/2015 due to progressive memory and cognitive difficulties along with increase frequency of falls and balance problems.  Extensive imaging and lab work-up unremarkable for any reversible causes.  Memory has been stable since prior visit 1 year ago with Dr. Leonie Man without worsening.  No reoccurring gait or balance concerns.  She denies recurrent stroke or TIA symptoms since 2008.  Continues on aspirin for secondary stroke prevention.  Crestor currently on hold due to elevated liver enzymes currently being followed by [facility] gastroenterology and PCP who plans on repeat lab work around June/July.  Blood pressure today 137/85.  No concerns at this time.    Update 01/29/2022 Dr. Leonie Man: Becky [name] is a 72 year old pleasant Caucasian lady seen today for consultation visit for headache.  History is obtained from the patient and review of electronic medical records and I reviewed pertinent available imaging films in PACS.  She has longstanding history of anxiety depression, chronic kidney disease, hypertension, hyperlipidemia old stroke in October 2011, TIA and hypothyroidism.  Patient states that she had COVID illness in July 2022 following which she has had daily headaches as well as nausea which is unrelenting and not going away.  Describes the headache as being bifrontal constant.  He describes the quality activity vomiting.  The headache is moderate 5-6/10 in severity.  She has constant nausea.  She does not vomit.  The headache increases with physical activity and she is unable to continue with work he has to lie down.  She feels better when she sleeps.  She has been taking 2  tablets of over-the-counter headache pills on a daily basis but to provide only short-term benefit.  She has not taken any daily headache prophylactic medications.  She has not had any brain imaging done.  She denies any visual symptoms, blurred vision, focal extremity weakness and minutes.  She does have longstanding history of mild short-term memory difficulties .  She was seen last in office in April 2021 by Becky [name] nurse practitioner.  He states that short-term memory and cognitive difficulties are unchanged and nonprogressive.  She has not been participating in any regular cognitively challenging activities .    Update 05/05/2022 JM: Patient returns for headache follow-up.  She was started on topiramate 25 mg twice daily at prior visit as well as recommended completion of brain MRI.  Reports improvement of headaches since prior visit, only occurring on occasion currently, tolerating topiramate well. Has limited OTC use. Recently started saline eye drops which she believes has also helped further improve headaches. MR brian not yet completed.         ROS:   14 system review of systems is positive for those listed in HPI  and all other systems negative  PMH:  Past Medical History:  Diagnosis Date   Chronic kidney disease    CVA (cerebral vascular accident) ([place]) 11/14/2013   Depression    followed by Dr Waverly Ferrari frequently 10/01/2015   Genital herpes    History of melanoma    followed by derm Dr Delman Cheadle   Hyperlipidemia    Hypertension    MCI (mild cognitive impairment) 10/01/2015   Pure hypercholesterolemia 11/14/2013   Stroke [facility])    Old stroke seen on MRI 12/2009, carotid doppler w minimal plaque, MRI off the head otherwise neg for acute disease.   Thyroid disease    hypothyroid. Followed by Dr Toy Care   TIA (transient ischemic attack) 8/08   followed by neurologist Dr Leonie Man    Social History:  Social History   Socioeconomic History   Marital status: Single    Spouse name:  Not on file   Number of children: Not on file   Years of education: Not on file   Highest education level: Not on file  Occupational History   Not on file  Tobacco Use   Smoking status: Never   Smokeless tobacco: Never  Vaping Use   Vaping Use: Never used  Substance and Sexual Activity   Alcohol use: No   Drug use: No   Sexual activity: Never  Other Topics Concern   Not on file  Social History Narrative   Not on file   Social Determinants of Health   Financial Resource Strain: Not on file  Food Insecurity: Not on file  Transportation Needs: Not on file  Physical Activity: Not on file  Stress: Not on file  Social Connections: Not on file  Intimate Partner Violence: Not on file    Medications:   Current Outpatient Medications on File Prior to Visit  Medication Sig Dispense Refill   albuterol (ACCUNEB) 0.63 MG/3ML nebulizer solution Take 3 mLs (0.63 mg total) by nebulization every 6 (six) hours as needed for wheezing. 120 mL 12   albuterol (PROVENTIL) (2.5 MG/3ML) 0.083% nebulizer solution take THREE mls by NEBULIZER EVERY 6 HOURS AS NEEDED FOR SHORTNESS OF BREATH OR wheezing 90 mL 3   albuterol (VENTOLIN HFA) 108 (90 Base) MCG/ACT inhaler Inhale 1 puff into the lungs every 4 (four) hours as needed for wheezing.     ALPRAZolam (XANAX) 0.25 MG tablet Take 0.25 mg by mouth 3 (three) times daily as needed for anxiety or sleep.  1   amLODipine (NORVASC) 5 MG tablet Take 5 mg by mouth daily.      ARIPiprazole (ABILIFY) 5 MG tablet Take 5 mg by mouth daily.     aspirin EC 325 MG tablet Take 325 mg by mouth 2 (two) times daily.      desvenlafaxine (PRISTIQ) 100 MG 24 hr tablet Take 100 mg by mouth daily.     desvenlafaxine (PRISTIQ) 25 MG 24 hr tablet Take 25 mg by mouth daily. Take with '100mg'$  tab     Dexmethylphenidate HCl 40 MG CP24 Take 40 mg daily by mouth.     fluticasone-salmeterol (ADVAIR DISKUS) 500-50 MCG/ACT AEPB Inhale 1 puff into the lungs in the morning and at bedtime.  60 each 11   hydrochlorothiazide (HYDRODIURIL) 25 MG tablet Take 25 mg by mouth every morning.     levothyroxine (SYNTHROID, LEVOTHROID) 25 MCG tablet Take 25 mcg by mouth daily before breakfast.     montelukast (SINGULAIR) 10 MG tablet TAKE ONE TABLET BY MOUTH EVERYDAY  AT BEDTIME 90 tablet 0   nadolol (CORGARD) 20 MG tablet TAKE ONE TABLET BY MOUTH ONCE DAILY 90 tablet 1   omeprazole (PRILOSEC) 40 MG capsule Take 1 capsule (40 mg total) by mouth every morning. 30 capsule 3   pantoprazole (PROTONIX) 40 MG tablet Take 40 mg by mouth 2 (two) times daily.     potassium chloride (KLOR-CON M) 10 MEQ tablet Take 2 tablets (20 mEq total) by mouth daily. 30 tablet 2   pramipexole (MIRAPEX) 0.125 MG tablet Take 0.125 mg by mouth at bedtime.  11   pravastatin (PRAVACHOL) 40 MG tablet Take 40 mg by mouth daily.     telmisartan (MICARDIS) 80 MG tablet Take 80 mg by mouth daily.     topiramate (TOPAMAX) 25 MG tablet Take 1 tablet (25 mg total) by mouth 2 (two) times daily. 120 tablet 3   No current facility-administered medications on file prior to visit.    Allergies:   Allergies  Allergen Reactions   Penicillins Anaphylaxis   Iodinated Contrast Media Other (See Comments)   Iohexol Other (See Comments)     Desc: iodine    Nsaids Other (See Comments)    CKD     Physical Exam Today's Vitals   05/05/22 1237 05/05/22 1241  BP: (!) 174/123 (!) 185/121  Pulse: 86 88  Weight: 199 lb (90.3 kg)   Height: 5' (1.524 m)    Body mass index is 38.86 kg/m.  General:Pleasant elderly Caucasian lady seated, in no evident distress Head: head normocephalic and atraumatic.   Neck: supple with no carotid or supraclavicular bruits Cardiovascular: regular rate and rhythm, no murmurs Musculoskeletal: no deformity Skin:  no rash/petichiae Vascular:  Normal pulses all extremities  Neurologic Exam Mental Status: Awake and fully alert. Oriented to place and time. Recent memory mildly impaired and remote  memory intact. Attention span, concentration and fund of knowledge appropriate. Mood and affect appropriate.  Cranial Nerves: Pupils equal, briskly reactive to light. Extraocular movements full without nystagmus but dysmetric saccades when looking to the left. Visual fields full to confrontation. Hearing intact. Facial sensation intact. Face, tongue, palate moves normally and symmetrically.  Motor: Normal bulk and tone. Normal strength in all tested extremity muscles. Sensory.: intact to touch , pinprick , position and vibratory sensation.  Coordination: Rapid alternating movements normal in all extremities. Finger-to-nose and heel-to-shin performed accurately bilaterally. Gait and Station: Arises from chair without difficulty. Stance is normal. Gait demonstrates normal stride length and balance . Able to heel, toe and tandem walk with moderate t difficulty.  Reflexes: 1+ and symmetric. Toes downgoing.         ASSESSMENT/PLAN: 72 year old Caucasian lady with new onset daily persistent headache following COVID illness in July 2022 likely muscle tension headache with analgesic rebound. Longstanding history of mild cognitive impairment which appears stable   Chronic daily headache Continue Topamax 25 mg twice daily  Limit use of OTC medication to prevent analgesic rebound Advised to contact GI to complete MRI brain previously ordered by Dr. Leonie Man  Of note, blood pressure elevated today, asymptomatic.  Advised to monitor at home and if remains elevated to contact PCP for further management/recommendations.    Follow-up in 6 months or call earlier if needed   I spent 22 minutes of face-to-face and non-face-to-face time with patient.  This included previsit chart review, lab review, study review, order entry, electronic health record documentation, patient education and discussion regarding above diagnoses and treatment plan and answered all other questions to  patient's satisfaction  Frann Rider, AGNP-BC  [facility] [address] [place], [place] [address]  Phone [phone] Fax [phone] Note: This document was prepared with digital dictation and possible smart phrase technology. Any transcriptional errors that result from this process are unintentional.

## 2022-05-05 ENCOUNTER — Ambulatory Visit: Payer: Medicare Other | Admitting: Adult Health

## 2022-05-05 ENCOUNTER — Encounter: Payer: Self-pay | Admitting: Adult Health

## 2022-05-05 VITALS — BP 185/121 | HR 88 | Ht 60.0 in | Wt 199.0 lb

## 2022-05-05 DIAGNOSIS — R519 Headache, unspecified: Secondary | ICD-10-CM | POA: Diagnosis not present

## 2022-05-05 NOTE — Patient Instructions (Addendum)
Your Plan:  Continue topiramate '25mg'$  twice daily for headache prevention  Please contact [facility] to schedule MRI brain as previously recommended by Dr. Leonie Man - office number [phone]    Follow up in 6 months or call earlier if needed     Thank you for coming to see Becky [name] at [facility]. I hope we have been able to provide you high quality care today.  You may receive a patient satisfaction survey over the next few weeks. We would appreciate your feedback and comments so that we may continue to improve ourselves and the health of our patients.

## 2022-05-27 ENCOUNTER — Ambulatory Visit: Payer: Medicare Other

## 2022-06-05 DIAGNOSIS — R062 Wheezing: Secondary | ICD-10-CM | POA: Diagnosis not present

## 2022-06-05 DIAGNOSIS — R051 Acute cough: Secondary | ICD-10-CM | POA: Diagnosis not present

## 2022-06-05 DIAGNOSIS — J441 Chronic obstructive pulmonary disease with (acute) exacerbation: Secondary | ICD-10-CM | POA: Diagnosis not present

## 2022-06-07 DIAGNOSIS — R051 Acute cough: Secondary | ICD-10-CM | POA: Diagnosis not present

## 2022-06-10 DIAGNOSIS — J4541 Moderate persistent asthma with (acute) exacerbation: Secondary | ICD-10-CM | POA: Diagnosis not present

## 2022-06-10 DIAGNOSIS — R030 Elevated blood-pressure reading, without diagnosis of hypertension: Secondary | ICD-10-CM | POA: Diagnosis not present

## 2022-06-11 ENCOUNTER — Ambulatory Visit: Payer: Medicare Other

## 2022-06-13 DIAGNOSIS — R053 Chronic cough: Secondary | ICD-10-CM | POA: Diagnosis not present

## 2022-06-13 DIAGNOSIS — R062 Wheezing: Secondary | ICD-10-CM | POA: Diagnosis not present

## 2022-06-19 ENCOUNTER — Other Ambulatory Visit: Payer: Self-pay | Admitting: Family Medicine

## 2022-06-19 ENCOUNTER — Ambulatory Visit
Admission: RE | Admit: 2022-06-19 | Discharge: 2022-06-19 | Disposition: A | Discharge status: Home / Self Care | Payer: Medicare Other | Source: Ambulatory Visit | Attending: Family Medicine | Admitting: Family Medicine

## 2022-06-19 DIAGNOSIS — R062 Wheezing: Secondary | ICD-10-CM

## 2022-06-19 DIAGNOSIS — R059 Cough, unspecified: Secondary | ICD-10-CM | POA: Diagnosis not present

## 2022-06-19 DIAGNOSIS — R053 Chronic cough: Secondary | ICD-10-CM

## 2022-06-20 DIAGNOSIS — I10 Essential (primary) hypertension: Secondary | ICD-10-CM | POA: Diagnosis not present

## 2022-06-24 DIAGNOSIS — N183 Chronic kidney disease, stage 3 unspecified: Secondary | ICD-10-CM | POA: Diagnosis not present

## 2022-06-24 DIAGNOSIS — E039 Hypothyroidism, unspecified: Secondary | ICD-10-CM | POA: Diagnosis not present

## 2022-06-24 DIAGNOSIS — K219 Gastro-esophageal reflux disease without esophagitis: Secondary | ICD-10-CM | POA: Diagnosis not present

## 2022-06-24 DIAGNOSIS — E785 Hyperlipidemia, unspecified: Secondary | ICD-10-CM | POA: Diagnosis not present

## 2022-06-24 DIAGNOSIS — I10 Essential (primary) hypertension: Secondary | ICD-10-CM | POA: Diagnosis not present

## 2022-06-24 DIAGNOSIS — E7800 Pure hypercholesterolemia, unspecified: Secondary | ICD-10-CM | POA: Diagnosis not present

## 2022-06-24 DIAGNOSIS — J4541 Moderate persistent asthma with (acute) exacerbation: Secondary | ICD-10-CM | POA: Diagnosis not present

## 2022-06-24 DIAGNOSIS — M8580 Other specified disorders of bone density and structure, unspecified site: Secondary | ICD-10-CM | POA: Diagnosis not present

## 2022-06-27 ENCOUNTER — Ambulatory Visit (HOSPITAL_BASED_OUTPATIENT_CLINIC_OR_DEPARTMENT_OTHER): Payer: Medicare Other | Admitting: Pulmonary Disease

## 2022-07-08 ENCOUNTER — Ambulatory Visit
Admission: RE | Admit: 2022-07-08 | Discharge: 2022-07-08 | Disposition: A | Discharge status: Home / Self Care | Payer: Medicare Other | Source: Ambulatory Visit | Attending: Neurology | Admitting: Neurology

## 2022-07-08 DIAGNOSIS — R519 Headache, unspecified: Secondary | ICD-10-CM | POA: Diagnosis not present

## 2022-07-08 DIAGNOSIS — G3184 Mild cognitive impairment, so stated: Secondary | ICD-10-CM | POA: Diagnosis not present

## 2022-07-16 ENCOUNTER — Other Ambulatory Visit: Payer: Self-pay | Admitting: Cardiology

## 2022-07-24 ENCOUNTER — Ambulatory Visit (HOSPITAL_BASED_OUTPATIENT_CLINIC_OR_DEPARTMENT_OTHER): Payer: Medicare Other | Admitting: Pulmonary Disease

## 2022-07-24 ENCOUNTER — Encounter (HOSPITAL_BASED_OUTPATIENT_CLINIC_OR_DEPARTMENT_OTHER): Payer: Self-pay | Admitting: Pulmonary Disease

## 2022-07-24 VITALS — BP 140/100 | HR 109 | Temp 98.8°F | Ht 60.0 in | Wt 193.6 lb

## 2022-07-24 DIAGNOSIS — R29898 Other symptoms and signs involving the musculoskeletal system: Secondary | ICD-10-CM

## 2022-07-24 DIAGNOSIS — J4540 Moderate persistent asthma, uncomplicated: Secondary | ICD-10-CM

## 2022-07-24 DIAGNOSIS — F32A Depression, unspecified: Secondary | ICD-10-CM | POA: Diagnosis not present

## 2022-07-24 NOTE — Patient Instructions (Addendum)
COVID-19 long hauler manifesting in dyspnea Moderate persistent asthma - well-controlled CONTINUE Advair 500-50 ONE puff TWICE a day CONTINUE Albuterol or nebulizer AS NEEDED STOP singulair  Deconditioning --Activity limited by dizziness --RE-REFER to outpatient therapy at [facility]  Depression --STOP singulair --Currently in therapy --Consider options with insurance for additional support/therapy. Online options like [email or web address]

## 2022-07-24 NOTE — Progress Notes (Signed)
Subjective:   PATIENT ID: Becky [name] GENDER: female DOB: December 28, 1950, MRN: [id number]   HPI  Chief Complaint  Patient presents with   Follow-up    Follow up. Patient has no complaints.     Reason for Visit: Follow-up   Becky [name] is a 72 year old female with hx COVID 08/2020 followed by post-viral asthma, hx CVA, hx melanoma, HTN, CKD who presents for asthma follow-up  Synopsis: 2022 -She was diagnosed with COVID-19 in July 2022. Denies prior history of asthma or frequent respiratory infections.  2023 - March and May had outpatient exacerbation and hospitalization in July. Flovent>Advair. Improved control of symptoms. 2024 - Well-controlled on high dose Advair with no exacerbations. Recent dizziness and deconditioning affecting her activity level  07/24/22 Since our last visit she is compliant with her Advair 500 twice a day. Rarely uses rescue inhaler. Denies shortness of breath, cough, wheezing. Denies nocturnal symptoms. No exacerbations in the 11 months. Limited in activity due to weakness. Dizziness has improved and followed by Neurology. Depression is still affecting her and only sees therapist twice a year and taking anti-depressants. Feels tired but reports her sleep is fine.  Asthma Control Test ACT Total Score  07/24/2022  2:40 PM 24  04/25/2022  3:27 PM 17  11/20/2021  2:11 PM 19   2023 Jan Feb Mar April May June July Aug Sept Oct Nov Dec     X  X X        2024 Jan Feb Mar April May June July Aug Sept Oct Nov Dec                2025 Jan Feb Mar April May June July Aug Sept Oct Nov Dec                     09/11/2021    3:24 PM  Results of the Epworth flowsheet  Sitting and reading 2  Watching TV 1  Sitting, inactive in a public place (e.g. a theatre or a meeting) 0  As a passenger in a car for an hour without a break 1  Lying down to rest in the afternoon when circumstances permit 1  Sitting and talking to someone 0  Sitting quietly after a  lunch without alcohol 0  In a car, while stopped for a few minutes in traffic 0  Total score 5    Social History: Never smoker Childhood second hand smoke exposure  Past Medical History:  Diagnosis Date   Chronic kidney disease    CVA (cerebral vascular accident) (HCC) 11/14/2013   Depression    followed by Dr Evelene Croon   Falls frequently 10/01/2015   Genital herpes    History of melanoma    followed by derm Dr Emily Filbert   Hyperlipidemia    Hypertension    MCI (mild cognitive impairment) 10/01/2015   Pure hypercholesterolemia 11/14/2013   Stroke [facility])    Old stroke seen on MRI 12/2009, carotid doppler w minimal plaque, MRI off the head otherwise neg for acute disease.   Thyroid disease    hypothyroid. Followed by Dr Evelene Croon   TIA (transient ischemic attack) 8/08   followed by neurologist Dr Pearlean Brownie     Family History  Problem Relation Age of Onset   Breast cancer Mother    Hyperlipidemia Father    Hypertension Father    Heart attack Father    Hypertension Sister    Hyperlipidemia Sister    Dementia  Maternal Aunt      Social History   Occupational History   Not on file  Tobacco Use   Smoking status: Never   Smokeless tobacco: Never  Vaping Use   Vaping Use: Never used  Substance and Sexual Activity   Alcohol use: No   Drug use: No   Sexual activity: Never    Allergies  Allergen Reactions   Penicillins Anaphylaxis   Iodinated Contrast Media Other (See Comments)   Iohexol Other (See Comments)     Desc: iodine    Nsaids Other (See Comments)    CKD      Outpatient Medications Prior to Visit  Medication Sig Dispense Refill   albuterol (ACCUNEB) 0.63 MG/3ML nebulizer solution Take 3 mLs (0.63 mg total) by nebulization every 6 (six) hours as needed for wheezing. 120 mL 12   albuterol (PROVENTIL) (2.5 MG/3ML) 0.083% nebulizer solution take THREE mls by NEBULIZER EVERY 6 HOURS AS NEEDED FOR SHORTNESS OF BREATH OR wheezing 90 mL 3   albuterol (VENTOLIN HFA) 108 (90 Base)  MCG/ACT inhaler Inhale 1 puff into the lungs every 4 (four) hours as needed for wheezing.     ALPRAZolam (XANAX) 0.25 MG tablet Take 0.25 mg by mouth 3 (three) times daily as needed for anxiety or sleep.  1   amLODipine (NORVASC) 5 MG tablet Take 5 mg by mouth daily.      ARIPiprazole (ABILIFY) 5 MG tablet Take 5 mg by mouth daily.     aspirin EC 325 MG tablet Take 325 mg by mouth 2 (two) times daily.      desvenlafaxine (PRISTIQ) 100 MG 24 hr tablet Take 100 mg by mouth daily.     desvenlafaxine (PRISTIQ) 25 MG 24 hr tablet Take 25 mg by mouth daily. Take with 100mg  tab     Dexmethylphenidate HCl 40 MG CP24 Take 40 mg daily by mouth.     fluticasone-salmeterol (ADVAIR DISKUS) 500-50 MCG/ACT AEPB Inhale 1 puff into the lungs in the morning and at bedtime. 60 each 11   hydrochlorothiazide (HYDRODIURIL) 25 MG tablet Take 25 mg by mouth every morning.     levothyroxine (SYNTHROID, LEVOTHROID) 25 MCG tablet Take 25 mcg by mouth daily before breakfast.     nadolol (CORGARD) 20 MG tablet Take 1 tablet (20 mg total) by mouth daily. Patient needs appt for # 90 supply or future refills. Please call office to schedule appt. 30 tablet 0   omeprazole (PRILOSEC) 40 MG capsule Take 1 capsule (40 mg total) by mouth every morning. 30 capsule 3   pantoprazole (PROTONIX) 40 MG tablet Take 40 mg by mouth 2 (two) times daily.     potassium chloride (KLOR-CON M) 10 MEQ tablet Take 2 tablets (20 mEq total) by mouth daily. 30 tablet 2   pramipexole (MIRAPEX) 0.125 MG tablet Take 0.125 mg by mouth at bedtime.  11   pravastatin (PRAVACHOL) 40 MG tablet Take 40 mg by mouth daily.     telmisartan (MICARDIS) 80 MG tablet Take 80 mg by mouth daily.     topiramate (TOPAMAX) 25 MG tablet Take 1 tablet (25 mg total) by mouth 2 (two) times daily. 120 tablet 3   montelukast (SINGULAIR) 10 MG tablet TAKE ONE TABLET BY MOUTH EVERYDAY AT BEDTIME 90 tablet 0   No facility-administered medications prior to visit.    Review of  Systems  Constitutional:  Negative for chills, diaphoresis, fever, malaise/fatigue and weight loss.  HENT:  Negative for congestion.   Respiratory:  Negative for cough, hemoptysis, sputum production, shortness of breath and wheezing.   Cardiovascular:  Negative for chest pain, palpitations and leg swelling.  Psychiatric/Behavioral:  Positive for depression.      Objective:   Vitals:   07/24/22 1412  BP: (!) 140/100  Pulse: (!) 109  Temp: 98.8 F (37.1 C)  TempSrc: Oral  SpO2: 96%  Weight: 193 lb 9.6 oz (87.8 kg)  Height: 5' (1.524 m)   SpO2: 96 % O2 Device: None (Room air)  Physical Exam: General: Well-appearing, no acute distress HENT: [place], AT Eyes: EOMI, no scleral icterus Respiratory: Clear to auscultation bilaterally.  No crackles, wheezing or rales Cardiovascular: RRR, -M/R/G, no JVD Extremities:-Edema,-tenderness Neuro: AAO x4, CNII-XII grossly intact Psych: Normal mood, normal affect  Data Reviewed:  Imaging: CXR 03/15/21 - Lingular scarring. No infiltrate, effusion or edema CT Chest 08/17/21 - No parenchymal abnormalities  PFT: 05/06/21 FVC 1.88 (74%) FEV1 1.5 (79%) Ratio 81  TLC 127% DLCO 89% Interpretation: No obstructive defect on spirometry however air trapping present and F-V curves suggestive of obstructive defect/small airway disease. Normal gas exchange  Labs: CBC    Component Value Date/Time   WBC 15.0 (H) 08/18/2021 0727   RBC 3.84 (L) 08/18/2021 0727   HGB 12.3 08/18/2021 0727   HGB 13.0 06/30/2013 1300   HCT 36.6 08/18/2021 0727   HCT 38.7 06/30/2013 1300   PLT 281 08/18/2021 0727   PLT 341 06/30/2013 1300   MCV 95.3 08/18/2021 0727   MCV 89 06/30/2013 1300   MCH 32.0 08/18/2021 0727   MCHC 33.6 08/18/2021 0727   RDW 13.3 08/18/2021 0727   RDW 13.4 06/30/2013 1300   LYMPHSABS 1.2 08/17/2021 1737   LYMPHSABS 2.7 06/30/2013 1300   MONOABS 0.6 08/17/2021 1737   MONOABS 1.1 (H) 06/30/2013 1300   EOSABS 0.0 08/17/2021 1737   EOSABS 0.2  06/30/2013 1300   BASOSABS 0.0 08/17/2021 1737   BASOSABS 0.1 06/30/2013 1300   Abs eos 08/12/21 -300 IgE 08/12/21 -131    Assessment & Plan:   Discussion: 72 year old female never smoker with hx COVID 08/2020 followed by post-viral asthma, hx melanoma, HTN, CKD who presents for follow-up. Asthma well-controlled on high dose ICS/LABA. Her deconditioning however is debilitating and worsening her depression. Empathy provided. Counseled on resources and plan to re-refer to outpatient physical therapy.  COVID-19 long hauler manifesting in dyspnea Moderate persistent asthma - well controlled CONTINUE Advair 500-50 ONE puff TWICE a day CONTINUE Albuterol or nebulizer AS NEEDED STOP singulair  Severe OSA: Fatigue, daytime sleepiness Declined PAP titration study again today Feels like she is sleeping well  Deconditioning --Activity limited by dizziness --RE-REFER to outpatient therapy at [facility]  Depression --STOP singulair --Currently in therapy --Consider options with insurance for additional support/therapy. Online options like [email or web address]  Health Maintenance Immunization History  Administered Date(s) Administered   Fluad Quad(high Dose 65+) 11/20/2021   Influenza Split 12/30/2020   Influenza, High Dose Seasonal PF 11/05/2016, 12/09/2017, 12/16/2018, 12/29/2019   Influenza,inj,quad, With Preservative 10/19/2014, 10/22/2015   Moderna Sars-Covid-2 Vaccination 02/10/2020   PFIZER(Purple Top)SARS-COV-2 Vaccination 05/14/2019, 06/04/2019   Pneumococcal Conjugate-13 11/05/2016   Pneumococcal Polysaccharide-23 12/25/2017   Td 10/19/2014   Zoster Recombinat (Shingrix) 12/01/2017, 11/19/2018   Zoster, Live 12/01/2017, 11/19/2018   CT Lung Screen - not qualified. Never smoker  Orders Placed This Encounter  Procedures   Ambulatory referral to Physical Therapy    Referral Priority:   Routine    Referral Type:   Physical Medicine  Referral Reason:   Specialty Services Required     Requested Specialty:   Physical Therapy    Number of Visits Requested:   1   No orders of the defined types were placed in this encounter.   Return in about 3 months (around 10/24/2022).  I have spent a total time of 35-minutes on the day of the appointment including chart review, data review, collecting history, coordinating care and discussing medical diagnosis and plan with the patient/family. Past medical history, allergies, medications were reviewed. Pertinent imaging, labs and tests included in this note have been reviewed and interpreted independently by me.  Rosland Riding Mechele Collin, MD [facility] 07/24/2022 2:49 PM  Office Number [phone]

## 2022-07-31 ENCOUNTER — Telehealth: Payer: Self-pay

## 2022-07-31 DIAGNOSIS — R110 Nausea: Secondary | ICD-10-CM | POA: Diagnosis not present

## 2022-07-31 DIAGNOSIS — K760 Fatty (change of) liver, not elsewhere classified: Secondary | ICD-10-CM | POA: Diagnosis not present

## 2022-07-31 NOTE — Telephone Encounter (Signed)
-----   Message from [place], [place] sent at 07/21/2022  7:41 AM EDT -----  ----- Message ----- From: Micki Riley, MD Sent: 07/18/2022   9:51 AM EDT To: Gna-Pod 2 Results  Kindly inform the patient that MRI scan of the brain shows shrinkage of the brain on both sides which has worsened since previous MRI from 2017.  There are age-related changes of hardening of the arteries.  No new finding

## 2022-07-31 NOTE — Telephone Encounter (Signed)
1st attempt unable to leave msg

## 2022-08-04 ENCOUNTER — Telehealth: Payer: Self-pay

## 2022-08-04 NOTE — Telephone Encounter (Signed)
Called patient and was unable to reach patient, was able to LVM to return call for MRi results.

## 2022-08-04 NOTE — Telephone Encounter (Signed)
-----   Message from Haleigh Freeman, CMA sent at 07/21/2022  7:41 AM EDT -----  ----- Message ----- From: Sethi, Pramod S, MD Sent: 07/18/2022   9:51 AM EDT To: Gna-Pod 2 Results  Kindly inform the patient that MRI scan of the brain shows shrinkage of the brain on both sides which has worsened since previous MRI from 2017.  There are age-related changes of hardening of the arteries.  No new finding  

## 2022-08-05 ENCOUNTER — Telehealth: Payer: Self-pay

## 2022-08-05 NOTE — Telephone Encounter (Signed)
Returned patient called and informed her per dr. Pearlean Brownie "MRI scan of the brain shows shrinkage of the brain on both sides which has worsened since previous MRI from 2017.  There are age-related changes of hardening of the arteries.  No new finding ." Pt verbalized understanding. Pt had no questions at this time but was encouraged to call back if questions arise.

## 2022-08-05 NOTE — Telephone Encounter (Signed)
Pt returned called. Requesting a call back from nurse.

## 2022-08-05 NOTE — Telephone Encounter (Signed)
Called patient and LVM to give us a call back

## 2022-08-05 NOTE — Telephone Encounter (Signed)
-----   Message from Haleigh Freeman, CMA sent at 07/21/2022  7:41 AM EDT -----  ----- Message ----- From: Sethi, Pramod S, MD Sent: 07/18/2022   9:51 AM EDT To: Gna-Pod 2 Results  Kindly inform the patient that MRI scan of the brain shows shrinkage of the brain on both sides which has worsened since previous MRI from 2017.  There are age-related changes of hardening of the arteries.  No new finding  

## 2022-08-22 DIAGNOSIS — E039 Hypothyroidism, unspecified: Secondary | ICD-10-CM | POA: Diagnosis not present

## 2022-08-22 DIAGNOSIS — J4541 Moderate persistent asthma with (acute) exacerbation: Secondary | ICD-10-CM | POA: Diagnosis not present

## 2022-08-22 DIAGNOSIS — N183 Chronic kidney disease, stage 3 unspecified: Secondary | ICD-10-CM | POA: Diagnosis not present

## 2022-08-22 DIAGNOSIS — M8580 Other specified disorders of bone density and structure, unspecified site: Secondary | ICD-10-CM | POA: Diagnosis not present

## 2022-08-22 DIAGNOSIS — I10 Essential (primary) hypertension: Secondary | ICD-10-CM | POA: Diagnosis not present

## 2022-08-22 DIAGNOSIS — E7800 Pure hypercholesterolemia, unspecified: Secondary | ICD-10-CM | POA: Diagnosis not present

## 2022-08-22 DIAGNOSIS — E785 Hyperlipidemia, unspecified: Secondary | ICD-10-CM | POA: Diagnosis not present

## 2022-08-27 ENCOUNTER — Other Ambulatory Visit: Payer: Self-pay

## 2022-08-27 ENCOUNTER — Encounter: Payer: Self-pay | Admitting: Physical Therapy

## 2022-08-27 ENCOUNTER — Ambulatory Visit: Payer: Medicare Other | Attending: Pulmonary Disease | Admitting: Physical Therapy

## 2022-08-27 DIAGNOSIS — M6281 Muscle weakness (generalized): Secondary | ICD-10-CM | POA: Diagnosis not present

## 2022-08-27 DIAGNOSIS — R2681 Unsteadiness on feet: Secondary | ICD-10-CM | POA: Insufficient documentation

## 2022-08-27 DIAGNOSIS — R2689 Other abnormalities of gait and mobility: Secondary | ICD-10-CM | POA: Insufficient documentation

## 2022-08-27 DIAGNOSIS — R29898 Other symptoms and signs involving the musculoskeletal system: Secondary | ICD-10-CM | POA: Diagnosis not present

## 2022-08-27 NOTE — Therapy (Signed)
OUTPATIENT PHYSICAL THERAPY NEURO EVALUATION   Patient Name: Becky [name] MRN: [id number] DOB:07-15-1950, 72 y.o., female Today's Date: 08/27/2022   PCP: Daisy Floro, MD REFERRING PROVIDER: Luciano Cutter, MD   END OF SESSION:  PT End of Session - 08/27/22 1307     Visit Number 1    Number of Visits 17    Date for PT Re-Evaluation 10/24/22    Authorization Type [employer] Medicare    PT Start Time 1313    PT Stop Time 1358    PT Time Calculation (min) 45 min    Activity Tolerance Patient tolerated treatment well    Behavior During Therapy [facility] for tasks assessed/performed             Past Medical History:  Diagnosis Date   Chronic kidney disease    CVA (cerebral vascular accident) (HCC) 11/14/2013   Depression    followed by Dr Arletha Pili frequently 10/01/2015   Genital herpes    History of melanoma    followed by derm Dr Emily Filbert   Hyperlipidemia    Hypertension    MCI (mild cognitive impairment) 10/01/2015   Pure hypercholesterolemia 11/14/2013   Stroke [facility])    Old stroke seen on MRI 12/2009, carotid doppler w minimal plaque, MRI off the head otherwise neg for acute disease.   Thyroid disease    hypothyroid. Followed by Dr Evelene Croon   TIA (transient ischemic attack) 8/08   followed by neurologist Dr Pearlean Brownie   Past Surgical History:  Procedure Laterality Date   ABDOMINAL HYSTERECTOMY     APPENDECTOMY     CHOLECYSTECTOMY     MELANOMA EXCISION     x2   Patient Active Problem List   Diagnosis Date Noted   Chronic fatigue 09/11/2021   Shortness of breath at rest 08/17/2021   Moderate persistent asthma 05/06/2021   History of COVID-19 03/19/2021   COVID-19 long hauler manifesting chronic dyspnea 03/19/2021   Thyroid disease    Stroke [facility])    Hypertension    Hyperlipidemia    History of melanoma    Genital herpes    Depression    Chronic kidney disease    MCI (mild cognitive impairment) 10/01/2015   Falls frequently 10/01/2015   Essential  hypertension, benign 11/14/2013   CVA (cerebral vascular accident) (HCC) 11/14/2013   Pure hypercholesterolemia 11/14/2013   TIA (transient ischemic attack) 10/02/2006    ONSET DATE: 07/24/2022 MD referral  REFERRING DIAG: R29.898 (ICD-10-CM) - Muscular deconditioning   THERAPY DIAG:  Muscle weakness (generalized)  Unsteadiness on feet  Other abnormalities of gait and mobility  Rationale for Evaluation and Treatment: Rehabilitation  SUBJECTIVE:  SUBJECTIVE STATEMENT: It's been a hard day today.  I've been fighting long Covid, was literally lying down in the bed for almost a year.  Just begun in the last few months getting out of the house and driving.  Have a hard time moving-my muscles hurt.  Headaches have subsided.  Balance is still off a bit. Pt accompanied by: self  PERTINENT HISTORY: hx COVID 08/2020 followed by post-viral asthma, hx CVA, hx melanoma, HTN, CKD, anxiety, depression  PAIN:  Are you having pain? No Sometimes muscle pain "all over", rates as 5/10 PRECAUTIONS: Fall and Other: asthma  WEIGHT BEARING RESTRICTIONS: No  FALLS: Has patient fallen in last 6 months? Yes. Number of falls 2-3  LIVING ENVIRONMENT: Lives with: lives alone Lives in: Other 1st floor condo Stairs: No Has following equipment at home: Single point cane  PLOF: Independent  Does have a lady that cleans periodically Prior to Covid 2022-was very active  PATIENT GOALS: Pt's goals for therapy are to get back to normal and be able to do things for longer periods.  OBJECTIVE:   DIAGNOSTIC FINDINGS: MRI findings  Moderate to severe perisylvian and moderate mesial parietal atrophy; progressed since 2017. - Moderate periventricular and subcortical chronic small vessel ischemic disease.  - Stable left inferior  frontal encephalomalacia and gliosis. - No acute findings.  COGNITION: Overall cognitive status: History of cognitive impairments - at baseline   SENSATION: Light touch: WFL  COORDINATION: WFL  VITALS: 96% O2, 72 bpm HR, BP 115/75  POSTURE: rounded shoulders and forward head  LOWER EXTREMITY ROM:   WFL A/ROM BLES   LOWER EXTREMITY MMT:    MMT Right Eval Left Eval  Hip flexion 4 4  Hip extension    Hip abduction 4 4  Hip adduction 4 4  Hip internal rotation    Hip external rotation    Knee flexion 4 4  Knee extension 4 4  Ankle dorsiflexion 4 4  Ankle plantarflexion    Ankle inversion    Ankle eversion    (Blank rows = not tested)    TRANSFERS: Assistive device utilized: None  Sit to stand: Modified independence Stand to sit: Modified independence  GAIT: Gait pattern: step through pattern and wide BOS Distance walked: 200 ft Assistive device utilized: Single point cane Level of assistance: SBA Comments: Occasional slowed pace to turn and sequence cane  FUNCTIONAL TESTS:  5 times sit to stand: 19.62 sec arms crossed at chest Timed up and go (TUG): 32.37 sec with cane 2 minute walk test: 150 ft 10 meter walk test: 21.5 Vitals after 2 MWT:  92%>96%, HR 75 bpm   TODAY'S TREATMENT:                                                                                                                              DATE: 08/27/2022    PATIENT EDUCATION: Education details: Initial eval results, POC, slowed pursed lip breathing Person educated: Patient  Education method: Explanation Education comprehension: verbalized understanding  HOME EXERCISE PROGRAM: Not yet initiate  GOALS: Goals reviewed with patient? Yes  SHORT TERM GOALS: Target date: 09/26/2022  Pt will be independent with HEP for improved balance, strength, gait. Baseline: Goal status: INITIAL  2.  Pt will improve 5x sit<>stand to less than or equal to 15 sec to demonstrate improved functional  strength and transfer efficiency. Baseline: 19.62 sec Goal status: INITIAL  3.  Pt will improve TUG score to less than or equal to 25 sec for decreased fall risk. Baseline: 32.37 sec Goal status: INITIAL  LONG TERM GOALS: Target date: 10/24/2022  Pt will be independent with HEP for improved balance, strength, gait. Baseline:  Goal status: INITIAL  2.  Pt will improve 5x sit<>stand to less than or equal to 13 sec to demonstrate improved functional strength and transfer efficiency. Baseline: 19.62 sec Goal status: INITIAL  3.  Pt will improve TUG score to less than or equal to 15 sec for decreased fall risk. Baseline: 32.37 sec Goal status: INITIAL  4.  Pt will improve gait velocity to at least 2 ft/sec for improved gait efficiency and safety. Baseline: 1.53 ft/sec Goal status: INITIAL  5.  Pt will ambulate at least 200 ft in 2 minute walk test to improve gait efficiency and endurance for community ambulation. Baseline: 150 ft Goal status: INITIAL  ASSESSMENT:  CLINICAL IMPRESSION: Patient is a 72 y.o. female who was seen today for physical therapy evaluation and treatment for muscular deconditioning.  She has hx of Covid in 2022, and reports staying in bed for almost one year.  She has recently worked to improve mobility, but reports decreased balance and strength, with several falls.  She demonstrates decreased strength, decreased balance, decreased gait efficiency, decreased gait speed, decreased gait endurance.  She is at fall risk per TUG, FTSTS, 55M walk test measures.  She will benefit from skilled PT to address the above stated deficits for decreased fall risk and improved overall functional mobility.  OBJECTIVE IMPAIRMENTS: Abnormal gait, decreased balance, decreased mobility, difficulty walking, and decreased strength.   ACTIVITY LIMITATIONS: standing, transfers, reach over head, and locomotion level  PARTICIPATION LIMITATIONS: meal prep, cleaning, laundry, and community  activity  PERSONAL FACTORS: 3+ comorbidities: see above for PMH  are also affecting patient's functional outcome.   REHAB POTENTIAL: Good  CLINICAL DECISION MAKING: Evolving/moderate complexity  EVALUATION COMPLEXITY: Moderate  PLAN:  PT FREQUENCY: 2x/week  PT DURATION: 8 weeks plus eval  PLANNED INTERVENTIONS: Therapeutic exercises, Therapeutic activity, Neuromuscular re-education, Balance training, Gait training, Patient/Family education, Self Care, and DME instructions  PLAN FOR NEXT SESSION: Initiate HEP for lower extremity strength and balance; gait training with cane.  Monitor vitals   Tyra Michelle W., PT 08/27/2022, 4:17 PM  [facility] at [facility] [address] [place], [address] [place], [place] [address] Phone # [phone] Fax # [phone]

## 2022-09-02 DIAGNOSIS — H5212 Myopia, left eye: Secondary | ICD-10-CM | POA: Diagnosis not present

## 2022-09-02 DIAGNOSIS — H349 Unspecified retinal vascular occlusion: Secondary | ICD-10-CM | POA: Diagnosis not present

## 2022-09-02 DIAGNOSIS — Z961 Presence of intraocular lens: Secondary | ICD-10-CM | POA: Diagnosis not present

## 2022-09-02 DIAGNOSIS — H3500 Unspecified background retinopathy: Secondary | ICD-10-CM | POA: Diagnosis not present

## 2022-09-08 NOTE — Therapy (Signed)
OUTPATIENT PHYSICAL THERAPY NEURO TREATMENT   Patient Name: Becky [name] MRN: [id number] DOB:February 12, 1951, 72 y.o., female Today's Date: 09/09/2022   PCP: Daisy Floro, MD REFERRING PROVIDER: Luciano Cutter, MD   END OF SESSION:  PT End of Session - 09/09/22 1356     Visit Number 2    Number of Visits 17    Date for PT Re-Evaluation 10/24/22    Authorization Type [employer] Medicare    PT Start Time 1306    PT Stop Time 1402    PT Time Calculation (min) 56 min    Equipment Utilized During Treatment Gait belt    Activity Tolerance Patient tolerated treatment well;Other (comment)   lightheadedness   Behavior During Therapy WFL for tasks assessed/performed              Past Medical History:  Diagnosis Date   Chronic kidney disease    CVA (cerebral vascular accident) (HCC) 11/14/2013   Depression    followed by Dr Arletha Pili frequently 10/01/2015   Genital herpes    History of melanoma    followed by derm Dr Emily Filbert   Hyperlipidemia    Hypertension    MCI (mild cognitive impairment) 10/01/2015   Pure hypercholesterolemia 11/14/2013   Stroke [facility])    Old stroke seen on MRI 12/2009, carotid doppler w minimal plaque, MRI off the head otherwise neg for acute disease.   Thyroid disease    hypothyroid. Followed by Dr Evelene Croon   TIA (transient ischemic attack) 8/08   followed by neurologist Dr Pearlean Brownie   Past Surgical History:  Procedure Laterality Date   ABDOMINAL HYSTERECTOMY     APPENDECTOMY     CHOLECYSTECTOMY     MELANOMA EXCISION     x2   Patient Active Problem List   Diagnosis Date Noted   Chronic fatigue 09/11/2021   Shortness of breath at rest 08/17/2021   Moderate persistent asthma 05/06/2021   History of COVID-19 03/19/2021   COVID-19 long hauler manifesting chronic dyspnea 03/19/2021   Thyroid disease    Stroke [facility])    Hypertension    Hyperlipidemia    History of melanoma    Genital herpes    Depression    Chronic kidney disease    MCI (mild  cognitive impairment) 10/01/2015   Falls frequently 10/01/2015   Essential hypertension, benign 11/14/2013   CVA (cerebral vascular accident) (HCC) 11/14/2013   Pure hypercholesterolemia 11/14/2013   TIA (transient ischemic attack) 10/02/2006    ONSET DATE: 07/24/2022 MD referral  REFERRING DIAG: R29.898 (ICD-10-CM) - Muscular deconditioning   THERAPY DIAG:  Muscle weakness (generalized)  Unsteadiness on feet  Other abnormalities of gait and mobility  Rationale for Evaluation and Treatment: Rehabilitation  SUBJECTIVE:  SUBJECTIVE STATEMENT: Been awfully tired, but I think that has to do with the weather. Pt denies being on supplemental O2; only on inhaler and nebulizer.   Pt accompanied by: self  PERTINENT HISTORY: hx COVID 08/2020 followed by post-viral asthma, hx CVA, hx melanoma, HTN, CKD, anxiety, depression  PAIN:  Are you having pain? No  PRECAUTIONS: Fall and Other: asthma  WEIGHT BEARING RESTRICTIONS: No  FALLS: Has patient fallen in last 6 months? Yes. Number of falls 2-3  LIVING ENVIRONMENT: Lives with: lives alone Lives in: Other 1st floor condo Stairs: No Has following equipment at home: Single point cane  PLOF: Independent  Does have a lady that cleans periodically Prior to Covid 2022-was very active  PATIENT GOALS: Pt's goals for therapy are to get back to normal and be able to do things for longer periods.  OBJECTIVE:       TODAY'S TREATMENT: 09/09/22 Activity Comments  Vitals at start of session 121/90 mmHg, 63 bpm, 93-95% spO2  gait training with SPC 3ft Lowered cane; good sequencing but slowed pace ; 97-98% spO2  STS 5x, with red medball 2x5 Able to perform from standard chair;   heel/toe raise 2x10  Cues for appropriate BOS  mini squat 2x10  Cues for  appropriate BOS; c/o LE muscle fatigue; 94-95%  sidestepping red TB above knees along counter 2x1 min Trunk lean anteriorly; c/o some LBP  Prayer stretch with pball 5x5" C/o lightheadedness, required laying down after completing this exercise  Vitals  66bpm, 130/79 bpm; 96% O2  Assisted pt in sitting up after performing marching and ankle pumps. 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 - Moderate periventricular and subcortical chronic small vessel ischemic disease.  - Stable left inferior frontal encephalomalacia and gliosis. - No acute findings.  COGNITION: Overall cognitive status: History of cognitive impairments - at baseline   SENSATION: Light touch: WFL  COORDINATION: WFL  VITALS: 96% O2, 72 bpm HR, BP 115/75  POSTURE: rounded shoulders and forward head  LOWER EXTREMITY ROM:   WFL A/ROM BLES   LOWER EXTREMITY MMT:    MMT Right Eval Left Eval  Hip flexion 4 4  Hip extension    Hip abduction 4 4  Hip adduction 4 4  Hip internal rotation    Hip external rotation    Knee flexion 4 4  Knee extension 4 4  Ankle dorsiflexion 4 4  Ankle plantarflexion    Ankle inversion    Ankle eversion    (Blank rows = not tested)    TRANSFERS: Assistive device utilized: None  Sit to stand: Modified independence Stand to sit: Modified  independence  GAIT: Gait pattern: step through pattern and wide BOS Distance walked: 200 ft Assistive device utilized: Single point cane Level of assistance: SBA Comments: Occasional slowed pace to turn and sequence cane  FUNCTIONAL TESTS:  5 times sit to stand: 19.62 sec arms crossed at chest Timed up and go (TUG): 32.37  sec with cane 2 minute walk test: 150 ft 10 meter walk test: 21.5 Vitals after 2 MWT:  92%>96%, HR 75 bpm   TODAY'S TREATMENT:                                                                                                                              DATE: 08/27/2022    PATIENT EDUCATION: Education details: Initial eval results, POC, slowed pursed lip breathing Person educated: Patient Education method: Explanation Education comprehension: verbalized understanding  HOME EXERCISE PROGRAM: Not yet initiate  GOALS: Goals reviewed with patient? Yes  SHORT TERM GOALS: Target date: 09/26/2022  Pt will be independent with HEP for improved balance, strength, gait. Baseline: Goal status: IN PROGRESS  2.  Pt will improve 5x sit<>stand to less than or equal to 15 sec to demonstrate improved functional strength and transfer efficiency. Baseline: 19.62 sec Goal status: IN PROGRESS  3.  Pt will improve TUG score to less than or equal to 25 sec for decreased fall risk. Baseline: 32.37 sec Goal status: IN PROGRESS  LONG TERM GOALS: Target date: 10/24/2022  Pt will be independent with HEP for improved balance, strength, gait. Baseline:  Goal status: IN PROGRESS  2.  Pt will improve 5x sit<>stand to less than or equal to 13 sec to demonstrate improved functional strength and transfer efficiency. Baseline: 19.62 sec Goal status: IN PROGRESS  3.  Pt will improve TUG score to less than or equal to 15 sec for decreased fall risk. Baseline: 32.37 sec Goal status: IN PROGRESS  4.  Pt will improve gait velocity to at least 2 ft/sec for improved gait efficiency and  safety. Baseline: 1.53 ft/sec Goal status: IN PROGRESS  5.  Pt will ambulate at least 200 ft in 2 minute walk test to improve gait efficiency and endurance for community ambulation. Baseline: 150 ft Goal status: IN PROGRESS  ASSESSMENT:  CLINICAL IMPRESSION: Patient arrived to session with report of some fatigue lately. Vitals at start of session were appropriate for exercise. Worked on gait training with patient's cane with good sequencing but slowed pace. Progressive LE strengthening activities revealed fairly good form and amplitude of movement. Patient did require occasional rest breaks d/t c/o fatigue but O2 was maintained WNL. C/o slight LBP was addressed with gentle stretching. After completing stretching, patient c/o lightheadedness. Denied chest  pain, SOB, weakness, N/T. Laid patient down and vitals were normal, however she still complained of lightheadedness. Repeat vitals showed some reduction in BP. This was improved with longer sit and water break. Patient was escorted safely out of session with friend.   OBJECTIVE IMPAIRMENTS: Abnormal gait, decreased balance, decreased mobility, difficulty walking, and decreased strength.   ACTIVITY LIMITATIONS: standing, transfers, reach over head, and locomotion level  PARTICIPATION LIMITATIONS: meal prep, cleaning, laundry, and community activity  PERSONAL FACTORS: 3+ comorbidities: see above for PMH  are also affecting patient's functional outcome.   REHAB POTENTIAL: Good  CLINICAL DECISION MAKING:  Evolving/moderate complexity  EVALUATION COMPLEXITY: Moderate  PLAN:  PT FREQUENCY: 2x/week  PT DURATION: 8 weeks plus eval  PLANNED INTERVENTIONS: Therapeutic exercises, Therapeutic activity, Neuromuscular re-education, Balance training, Gait training, Patient/Family education, Self Care, and DME instructions  PLAN FOR NEXT SESSION: Initiate HEP for lower extremity strength and balance; gait training with cane.  Monitor  vitals  Anette Guarneri, PT, DPT 09/09/22 2:45 PM  [facility] at [facility] [address] [place], [address] [place], [place] [address] Phone # [phone] Fax # [phone]

## 2022-09-09 ENCOUNTER — Ambulatory Visit: Payer: Medicare Other | Attending: Pulmonary Disease | Admitting: Physical Therapy

## 2022-09-09 ENCOUNTER — Encounter: Payer: Self-pay | Admitting: Physical Therapy

## 2022-09-09 DIAGNOSIS — M6281 Muscle weakness (generalized): Secondary | ICD-10-CM | POA: Insufficient documentation

## 2022-09-09 DIAGNOSIS — R2689 Other abnormalities of gait and mobility: Secondary | ICD-10-CM | POA: Insufficient documentation

## 2022-09-09 DIAGNOSIS — R2681 Unsteadiness on feet: Secondary | ICD-10-CM | POA: Diagnosis not present

## 2022-09-11 ENCOUNTER — Encounter: Payer: Self-pay | Admitting: Physical Therapy

## 2022-09-11 ENCOUNTER — Ambulatory Visit: Payer: Medicare Other | Admitting: Physical Therapy

## 2022-09-11 DIAGNOSIS — R2681 Unsteadiness on feet: Secondary | ICD-10-CM

## 2022-09-11 DIAGNOSIS — M6281 Muscle weakness (generalized): Secondary | ICD-10-CM | POA: Diagnosis not present

## 2022-09-11 DIAGNOSIS — R2689 Other abnormalities of gait and mobility: Secondary | ICD-10-CM

## 2022-09-11 NOTE — Therapy (Signed)
OUTPATIENT PHYSICAL THERAPY NEURO TREATMENT   Patient Name: Becky [name] MRN: [id number] DOB:[birth year], 72 y.o., female Today's Date: 09/11/2022   PCP: Daisy Floro, MD REFERRING PROVIDER: Luciano Cutter, MD   END OF SESSION:  PT End of Session - 09/11/22 1450     Visit Number 3    Number of Visits 17    Date for PT Re-Evaluation 10/24/22    Authorization Type [employer] Medicare    PT Start Time 1450    PT Stop Time 1530    PT Time Calculation (min) 40 min    Equipment Utilized During Treatment --    Activity Tolerance Patient tolerated treatment well    Behavior During Therapy [facility] for tasks assessed/performed               Past Medical History:  Diagnosis Date   Chronic kidney disease    CVA (cerebral vascular accident) (HCC) 11/14/2013   Depression    followed by Dr Arletha Pili frequently 10/01/2015   Genital herpes    History of melanoma    followed by derm Dr Emily Filbert   Hyperlipidemia    Hypertension    MCI (mild cognitive impairment) 10/01/2015   Pure hypercholesterolemia 11/14/2013   Stroke [facility])    Old stroke seen on MRI 12/2009, carotid doppler w minimal plaque, MRI off the head otherwise neg for acute disease.   Thyroid disease    hypothyroid. Followed by Dr Evelene Croon   TIA (transient ischemic attack) 8/08   followed by neurologist Dr Pearlean Brownie   Past Surgical History:  Procedure Laterality Date   ABDOMINAL HYSTERECTOMY     APPENDECTOMY     CHOLECYSTECTOMY     MELANOMA EXCISION     x2   Patient Active Problem List   Diagnosis Date Noted   Chronic fatigue 09/11/2021   Shortness of breath at rest 08/17/2021   Moderate persistent asthma 05/06/2021   History of COVID-19 03/19/2021   COVID-19 long hauler manifesting chronic dyspnea 03/19/2021   Thyroid disease    Stroke [facility])    Hypertension    Hyperlipidemia    History of melanoma    Genital herpes    Depression    Chronic kidney disease    MCI (mild cognitive impairment) 10/01/2015    Falls frequently 10/01/2015   Essential hypertension, benign 11/14/2013   CVA (cerebral vascular accident) (HCC) 11/14/2013   Pure hypercholesterolemia 11/14/2013   TIA (transient ischemic attack) 10/02/2006    ONSET DATE: 07/24/2022 MD referral  REFERRING DIAG: R29.898 (ICD-10-CM) - Muscular deconditioning   THERAPY DIAG:  Muscle weakness (generalized)  Other abnormalities of gait and mobility  Unsteadiness on feet  Rationale for Evaluation and Treatment: Rehabilitation  SUBJECTIVE:  SUBJECTIVE STATEMENT: Brought in my new BP cuff and would like to check it compared to yours.  Was okay this morning.  Just don't feel good.  (Not new, but I'm ready to feel better)  Pt accompanied by: self  PERTINENT HISTORY: hx COVID 08/2020 followed by post-viral asthma, hx CVA, hx melanoma, HTN, CKD, anxiety, depression  PAIN:  Are you having pain? No  PRECAUTIONS: Fall and Other: asthma  WEIGHT BEARING RESTRICTIONS: No  FALLS: Has patient fallen in last 6 months? Yes. Number of falls 2-3  LIVING ENVIRONMENT: Lives with: lives alone Lives in: Other 1st floor condo Stairs: No Has following equipment at home: Single point cane  PLOF: Independent  Does have a lady that cleans periodically Prior to Covid 2022-was very active  PATIENT GOALS: Pt's goals for therapy are to get back to normal and be able to do things for longer periods.  OBJECTIVE:     TODAY'S TREATMENT: 09/11/2022 Activity Comments  Beginning of session vitals 114/71, HR 59 O2 sats 96%  Seated leg strengthening: -marching in place 2 x 10, additional set 2# -LAQ 2 x 10, additional set, 2# -Seated hip adduction ball squeezes 2 x 10 -ankle pumps 2 x 10  O2 sats 94%  Sit<>Stand 3 x 5 reps From chair, hands at knees  Minisquats at  counter, 2 x 5 reps Standing heel/toe raises 2 x 10 reps   Gait with SPC 50 ft x 4 reps, then 40 ft x 2 Slowed pace, occasional cues for sequence with cane  Vitals at end of session: 127/84 HR 61 bpm       Access Code: [email or web address] URL: [email or web address] Date: 09/11/2022 Prepared by: [facility] - Outpatient  Rehab - [facility]  Exercises - Seated Long Arc Quad  - 1 x daily - 7 x weekly - 3 sets - 10 reps - Seated March  - 1 x daily - 7 x weekly - 3 sets - 10 reps - Seated Ankle Dorsiflexion AROM  - 1 x daily - 7 x weekly - 3 sets - 10 reps - Sit to Stand  - 1 x daily - 7 x weekly - 3 sets - 5 reps   PATIENT EDUCATION: Education details: HEP -see above Person educated: Patient Education method: Explanation, Demonstration, Tactile cues, Verbal cues, and Handouts Education comprehension: verbalized understanding, returned demonstration, verbal cues required, and tactile cues required     Below measures were taken at time of initial evaluation unless otherwise specified:   DIAGNOSTIC FINDINGS: MRI findings  Moderate to severe perisylvian and moderate mesial parietal atrophy; progressed since 2017. - Moderate periventricular and subcortical chronic small vessel ischemic disease.  - Stable left inferior frontal encephalomalacia and gliosis. - No acute findings.  COGNITION: Overall cognitive status: History of cognitive impairments - at baseline   SENSATION: Light touch: WFL  COORDINATION: WFL  VITALS: 96% O2, 72 bpm HR, BP 115/75  POSTURE: rounded shoulders and forward head  LOWER EXTREMITY ROM:   WFL A/ROM BLES   LOWER EXTREMITY MMT:    MMT Right Eval Left Eval  Hip flexion 4 4  Hip extension    Hip abduction 4 4  Hip adduction 4 4  Hip internal rotation    Hip external rotation    Knee flexion 4 4  Knee extension 4 4  Ankle dorsiflexion 4 4  Ankle plantarflexion    Ankle inversion    Ankle eversion    (Blank rows = not  tested)  TRANSFERS: Assistive device utilized: None  Sit to stand: Modified independence Stand to sit: Modified independence  GAIT: Gait pattern: step through pattern and wide BOS Distance walked: 200 ft Assistive device utilized: Single point cane Level of assistance: SBA Comments: Occasional slowed pace to turn and sequence cane  FUNCTIONAL TESTS:  5 times sit to stand: 19.62 sec arms crossed at chest Timed up and go (TUG): 32.37 sec with cane 2 minute walk test: 150 ft 10 meter walk test: 21.5 Vitals after 2 MWT:  92%>96%, HR 75 bpm   TODAY'S TREATMENT:                                                                                                                              DATE: 08/27/2022    PATIENT EDUCATION: Education details: Initial eval results, POC, slowed pursed lip breathing Person educated: Patient Education method: Explanation Education comprehension: verbalized understanding  HOME EXERCISE PROGRAM: Not yet initiate  GOALS: Goals reviewed with patient? Yes  SHORT TERM GOALS: Target date: 09/26/2022  Pt will be independent with HEP for improved balance, strength, gait. Baseline: Goal status: IN PROGRESS  2.  Pt will improve 5x sit<>stand to less than or equal to 15 sec to demonstrate improved functional strength and transfer efficiency. Baseline: 19.62 sec Goal status: IN PROGRESS  3.  Pt will improve TUG score to less than or equal to 25 sec for decreased fall risk. Baseline: 32.37 sec Goal status: IN PROGRESS  LONG TERM GOALS: Target date: 10/24/2022  Pt will be independent with HEP for improved balance, strength, gait. Baseline:  Goal status: IN PROGRESS  2.  Pt will improve 5x sit<>stand to less than or equal to 13 sec to demonstrate improved functional strength and transfer efficiency. Baseline: 19.62 sec Goal status: IN PROGRESS  3.  Pt will improve TUG score to less than or equal to 15 sec for decreased fall risk. Baseline: 32.37  sec Goal status: IN PROGRESS  4.  Pt will improve gait velocity to at least 2 ft/sec for improved gait efficiency and safety. Baseline: 1.53 ft/sec Goal status: IN PROGRESS  5.  Pt will ambulate at least 200 ft in 2 minute walk test to improve gait efficiency and endurance for community ambulation. Baseline: 150 ft Goal status: IN PROGRESS  ASSESSMENT:  CLINICAL IMPRESSION: Pt arrives to PT session today with no reports of additional BP issues or dizziness.  She does bring in her new BP cuff to check to make sure it's calibrated to clinic cuff, but it did not have any batteries in it.  Vitals assessed through session today and are WFL, no dizziness or lightheadedness complaints.  She performs seated and standing strengthening exercises, and HEP initiated.  She reports overall her legs just feel weak.  She will continue to benefit from skilled PT towards goals for improved functional mobility and decreased fall risk.   OBJECTIVE IMPAIRMENTS: Abnormal gait, decreased balance, decreased mobility, difficulty walking, and  decreased strength.   ACTIVITY LIMITATIONS: standing, transfers, reach over head, and locomotion level  PARTICIPATION LIMITATIONS: meal prep, cleaning, laundry, and community activity  PERSONAL FACTORS: 3+ comorbidities: see above for PMH  are also affecting patient's functional outcome.   REHAB POTENTIAL: Good  CLINICAL DECISION MAKING: Evolving/moderate complexity  EVALUATION COMPLEXITY: Moderate  PLAN:  PT FREQUENCY: 2x/week  PT DURATION: 8 weeks plus eval  PLANNED INTERVENTIONS: Therapeutic exercises, Therapeutic activity, Neuromuscular re-education, Balance training, Gait training, Patient/Family education, Self Care, and DME instructions  PLAN FOR NEXT SESSION: Review initial HEP and continue to work on progression of lower extremity strength and balance exercises; gait training with cane.  Monitor vitals  Lonia Blood, PT 09/11/22 3:40 PM Phone:  [phone] Fax: [phone]  [facility] Outpatient Rehab at [facility] [address] [place], [address] [place], [place] [address] Phone # [phone] Fax # [phone]

## 2022-09-16 ENCOUNTER — Ambulatory Visit: Payer: Medicare Other | Admitting: Physical Therapy

## 2022-09-17 DIAGNOSIS — H34831 Tributary (branch) retinal vein occlusion, right eye, with macular edema: Secondary | ICD-10-CM | POA: Diagnosis not present

## 2022-09-17 DIAGNOSIS — H35371 Puckering of macula, right eye: Secondary | ICD-10-CM | POA: Diagnosis not present

## 2022-09-17 DIAGNOSIS — H43813 Vitreous degeneration, bilateral: Secondary | ICD-10-CM | POA: Diagnosis not present

## 2022-09-17 DIAGNOSIS — H35033 Hypertensive retinopathy, bilateral: Secondary | ICD-10-CM | POA: Diagnosis not present

## 2022-09-19 ENCOUNTER — Encounter: Payer: Self-pay | Admitting: Physical Therapy

## 2022-09-19 ENCOUNTER — Ambulatory Visit: Payer: Medicare Other | Admitting: Physical Therapy

## 2022-09-19 DIAGNOSIS — M6281 Muscle weakness (generalized): Secondary | ICD-10-CM | POA: Diagnosis not present

## 2022-09-19 DIAGNOSIS — R2681 Unsteadiness on feet: Secondary | ICD-10-CM | POA: Diagnosis not present

## 2022-09-19 DIAGNOSIS — R2689 Other abnormalities of gait and mobility: Secondary | ICD-10-CM

## 2022-09-19 NOTE — Therapy (Signed)
OUTPATIENT PHYSICAL THERAPY NEURO TREATMENT   Patient Name: Becky [name] MRN: [id number] DOB:11/18/1950, 72 y.o., female Today's Date: 09/22/2022   PCP: Daisy Floro, MD REFERRING PROVIDER: Luciano Cutter, MD   END OF SESSION:  PT End of Session - 09/22/22 1449     Visit Number 5    Number of Visits 17    Date for PT Re-Evaluation 10/24/22    Authorization Type [employer] Medicare    PT Start Time 1405    PT Stop Time 1448    PT Time Calculation (min) 43 min    Activity Tolerance Patient tolerated treatment well    Behavior During Therapy [facility] for tasks assessed/performed                 Past Medical History:  Diagnosis Date   Chronic kidney disease    CVA (cerebral vascular accident) (HCC) 11/14/2013   Depression    followed by Dr Arletha Pili frequently 10/01/2015   Genital herpes    History of melanoma    followed by derm Dr Emily Filbert   Hyperlipidemia    Hypertension    MCI (mild cognitive impairment) 10/01/2015   Pure hypercholesterolemia 11/14/2013   Stroke [facility])    Old stroke seen on MRI 12/2009, carotid doppler w minimal plaque, MRI off the head otherwise neg for acute disease.   Thyroid disease    hypothyroid. Followed by Dr Evelene Croon   TIA (transient ischemic attack) 8/08   followed by neurologist Dr Pearlean Brownie   Past Surgical History:  Procedure Laterality Date   ABDOMINAL HYSTERECTOMY     APPENDECTOMY     CHOLECYSTECTOMY     MELANOMA EXCISION     x2   Patient Active Problem List   Diagnosis Date Noted   Chronic fatigue 09/11/2021   Shortness of breath at rest 08/17/2021   Moderate persistent asthma 05/06/2021   History of COVID-19 03/19/2021   COVID-19 long hauler manifesting chronic dyspnea 03/19/2021   Thyroid disease    Stroke [facility])    Hypertension    Hyperlipidemia    History of melanoma    Genital herpes    Depression    Chronic kidney disease    MCI (mild cognitive impairment) 10/01/2015   Falls frequently 10/01/2015   Essential  hypertension, benign 11/14/2013   CVA (cerebral vascular accident) (HCC) 11/14/2013   Pure hypercholesterolemia 11/14/2013   TIA (transient ischemic attack) 10/02/2006    ONSET DATE: 07/24/2022 MD referral  REFERRING DIAG: R29.898 (ICD-10-CM) - Muscular deconditioning   THERAPY DIAG:  Muscle weakness (generalized)  Other abnormalities of gait and mobility  Unsteadiness on feet  Rationale for Evaluation and Treatment: Rehabilitation  SUBJECTIVE:  SUBJECTIVE STATEMENT: R lung has been pretty affected from COVID.   Pt accompanied by: self  PERTINENT HISTORY: hx COVID 08/2020 followed by post-viral asthma, hx CVA, hx melanoma, HTN, CKD, anxiety, depression  PAIN:  Are you having pain? No Some muscle soreness in hips  PRECAUTIONS: Fall and Other: asthma  WEIGHT BEARING RESTRICTIONS: No  FALLS: Has patient fallen in last 6 months? Yes. Number of falls 2-3  LIVING ENVIRONMENT: Lives with: lives alone Lives in: Other 1st floor condo Stairs: No Has following equipment at home: Single point cane  PLOF: Independent  Does have a lady that cleans periodically Prior to Covid 2022-was very active  PATIENT GOALS: Pt's goals for therapy are to get back to normal and be able to do things for longer periods.  OBJECTIVE:     TODAY'S TREATMENT: 09/22/22    Orthostatic Testing   Supine Sitting Standing  x1 Minute Standing x 3 Minutes  BP 130/75 mmHg 108/71 *light 140/75 145/76  HR 64 bpm 64 66 66      Activity Comments  review of HEP:  standing hip ABD 0#, then 2# 10x each LE standing hip EX 10x each 2#  HS curl  10x each 2#  Good form  LAQ 4# 10x each LE Good form; difficulty   march 4# x20 Good form; difficulty    Squat to chair with airex on top10x            PATIENT  EDUCATION: Education details: edu on orthostatic results and self-management, review of HEP, answered pt's questions on how to address balance concerns  Person educated: Patient Education method: Explanation Education comprehension: verbalized understanding  HOME EXERCISE PROGRAM Last updated: 09/22/22  Access Code: [email or web address] URL: [email or web address] Date: 09/22/2022 Prepared by: [facility] - Outpatient  Rehab - [facility]  Exercises - Seated Long Arc Quad  - 1 x daily - 7 x weekly - 3 sets - 10 reps - Seated March  - 1 x daily - 7 x weekly - 3 sets - 10 reps - Seated Ankle Dorsiflexion AROM  - 1 x daily - 7 x weekly - 3 sets - 10 reps - Standing Hip Abduction with Counter Support  - 1 x daily - 5 x weekly - 2-3 sets - 10 reps - Standing Hip Extension with Counter Support  - 1 x daily - 5 x weekly - 2-3 sets - 10 reps - Squat with Chair Touch  - 1 x daily - 5 x weekly - 2 sets - 10 reps    Below measures were taken at time of initial evaluation unless otherwise specified:   DIAGNOSTIC FINDINGS: MRI findings  Moderate to severe perisylvian and moderate mesial parietal atrophy; progressed since 2017. - Moderate periventricular and subcortical chronic small vessel ischemic disease.  - Stable left inferior frontal encephalomalacia and gliosis. - No acute findings.  COGNITION: Overall cognitive status: History of cognitive impairments - at baseline   SENSATION: Light touch: WFL  COORDINATION: WFL  VITALS: 96% O2, 72 bpm HR, BP 115/75  POSTURE: rounded shoulders and forward head  LOWER EXTREMITY ROM:   WFL A/ROM BLES   LOWER EXTREMITY MMT:    MMT Right Eval Left Eval  Hip flexion 4 4  Hip extension    Hip abduction 4 4  Hip adduction 4 4  Hip internal rotation    Hip external rotation    Knee flexion 4 4  Knee extension 4 4  Ankle dorsiflexion 4 4  Ankle plantarflexion  Ankle inversion    Ankle eversion    (Blank rows = not  tested)    TRANSFERS: Assistive device utilized: None  Sit to stand: Modified independence Stand to sit: Modified independence  GAIT: Gait pattern: step through pattern and wide BOS Distance walked: 200 ft Assistive device utilized: Single point cane Level of assistance: SBA Comments: Occasional slowed pace to turn and sequence cane  FUNCTIONAL TESTS:  5 times sit to stand: 19.62 sec arms crossed at chest Timed up and go (TUG): 32.37 sec with cane 2 minute walk test: 150 ft 10 meter walk test: 21.5 Vitals after 2 MWT:  92%>96%, HR 75 bpm   TODAY'S TREATMENT:                                                                                                                              DATE: 08/27/2022    PATIENT EDUCATION: Education details: Initial eval results, POC, slowed pursed lip breathing Person educated: Patient Education method: Explanation Education comprehension: verbalized understanding  HOME EXERCISE PROGRAM: Not yet initiate  GOALS: Goals reviewed with patient? Yes  SHORT TERM GOALS: Target date: 09/26/2022  Pt will be independent with HEP for improved balance, strength, gait. Baseline: Goal status: IN PROGRESS  2.  Pt will improve 5x sit<>stand to less than or equal to 15 sec to demonstrate improved functional strength and transfer efficiency. Baseline: 19.62 sec Goal status: IN PROGRESS  3.  Pt will improve TUG score to less than or equal to 25 sec for decreased fall risk. Baseline: 32.37 sec Goal status: IN PROGRESS  LONG TERM GOALS: Target date: 10/24/2022  Pt will be independent with HEP for improved balance, strength, gait. Baseline:  Goal status: IN PROGRESS  2.  Pt will improve 5x sit<>stand to less than or equal to 13 sec to demonstrate improved functional strength and transfer efficiency. Baseline: 19.62 sec Goal status: IN PROGRESS  3.  Pt will improve TUG score to less than or equal to 15 sec for decreased fall risk. Baseline: 32.37  sec Goal status: IN PROGRESS  4.  Pt will improve gait velocity to at least 2 ft/sec for improved gait efficiency and safety. Baseline: 1.53 ft/sec Goal status: IN PROGRESS  5.  Pt will ambulate at least 200 ft in 2 minute walk test to improve gait efficiency and endurance for community ambulation. Baseline: 150 ft Goal status: IN PROGRESS  ASSESSMENT:  CLINICAL IMPRESSION: Patient arrived to session without new complaints. Orthostatic testing was positive from supine>sit. Educated on self-management techniques to address this and will message pt's providers with her consent. Patient performed LE strengthening activities with weights with good tolerance. No complaints at end of session.  OBJECTIVE IMPAIRMENTS: Abnormal gait, decreased balance, decreased mobility, difficulty walking, and decreased strength.   ACTIVITY LIMITATIONS: standing, transfers, reach over head, and locomotion level  PARTICIPATION LIMITATIONS: meal prep, cleaning, laundry, and community activity  PERSONAL FACTORS: 3+ comorbidities: see above for PMH  are also affecting patient's functional outcome.   REHAB POTENTIAL: Good  CLINICAL DECISION MAKING: Evolving/moderate complexity  EVALUATION COMPLEXITY: Moderate  PLAN:  PT FREQUENCY: 2x/week  PT DURATION: 8 weeks plus eval  PLANNED INTERVENTIONS: Therapeutic exercises, Therapeutic activity, Neuromuscular re-education, Balance training, Gait training, Patient/Family education, Self Care, and DME instructions  PLAN FOR NEXT SESSION: Review updates to HEP and continue to work on progression of lower extremity strength and balance exercises; gait training with cane.  Monitor vitals    Anette Guarneri, PT, DPT 09/22/22 2:51 PM  [facility] Outpatient Rehab at [facility] [address] [place], [address] [place], [place] [address] Phone # [phone] Fax # [phone]

## 2022-09-19 NOTE — Therapy (Signed)
OUTPATIENT PHYSICAL THERAPY NEURO TREATMENT   Patient Name: Becky [name] MRN: [id number] DOB:12/05/1950, 72 y.o., female Today's Date: 09/19/2022   PCP: Daisy Floro, MD REFERRING PROVIDER: Luciano Cutter, MD   END OF SESSION:  PT End of Session - 09/19/22 1102     Visit Number 4    Number of Visits 17    Date for PT Re-Evaluation 10/24/22    Authorization Type [employer] Medicare    PT Start Time 1103    PT Stop Time 1143    PT Time Calculation (min) 40 min    Activity Tolerance Patient tolerated treatment well    Behavior During Therapy [facility] for tasks assessed/performed                Past Medical History:  Diagnosis Date   Chronic kidney disease    CVA (cerebral vascular accident) (HCC) 11/14/2013   Depression    followed by Dr Arletha Pili frequently 10/01/2015   Genital herpes    History of melanoma    followed by derm Dr Emily Filbert   Hyperlipidemia    Hypertension    MCI (mild cognitive impairment) 10/01/2015   Pure hypercholesterolemia 11/14/2013   Stroke [facility])    Old stroke seen on MRI 12/2009, carotid doppler w minimal plaque, MRI off the head otherwise neg for acute disease.   Thyroid disease    hypothyroid. Followed by Dr Evelene Croon   TIA (transient ischemic attack) 8/08   followed by neurologist Dr Pearlean Brownie   Past Surgical History:  Procedure Laterality Date   ABDOMINAL HYSTERECTOMY     APPENDECTOMY     CHOLECYSTECTOMY     MELANOMA EXCISION     x2   Patient Active Problem List   Diagnosis Date Noted   Chronic fatigue 09/11/2021   Shortness of breath at rest 08/17/2021   Moderate persistent asthma 05/06/2021   History of COVID-19 03/19/2021   COVID-19 long hauler manifesting chronic dyspnea 03/19/2021   Thyroid disease    Stroke [facility])    Hypertension    Hyperlipidemia    History of melanoma    Genital herpes    Depression    Chronic kidney disease    MCI (mild cognitive impairment) 10/01/2015   Falls frequently 10/01/2015   Essential  hypertension, benign 11/14/2013   CVA (cerebral vascular accident) (HCC) 11/14/2013   Pure hypercholesterolemia 11/14/2013   TIA (transient ischemic attack) 10/02/2006    ONSET DATE: 07/24/2022 MD referral  REFERRING DIAG: R29.898 (ICD-10-CM) - Muscular deconditioning   THERAPY DIAG:  Muscle weakness (generalized)  Other abnormalities of gait and mobility  Unsteadiness on feet  Rationale for Evaluation and Treatment: Rehabilitation  SUBJECTIVE:  SUBJECTIVE STATEMENT: Was just not feeling well earlier this week.  Sometimes it does that, with the long Covid.    Pt accompanied by: self  PERTINENT HISTORY: hx COVID 08/2020 followed by post-viral asthma, hx CVA, hx melanoma, HTN, CKD, anxiety, depression  PAIN:  Are you having pain? No Some muscle soreness in hips  PRECAUTIONS: Fall and Other: asthma  WEIGHT BEARING RESTRICTIONS: No  FALLS: Has patient fallen in last 6 months? Yes. Number of falls 2-3  LIVING ENVIRONMENT: Lives with: lives alone Lives in: Other 1st floor condo Stairs: No Has following equipment at home: Single point cane  PLOF: Independent  Does have a lady that cleans periodically Prior to Covid 2022-was very active  PATIENT GOALS: Pt's goals for therapy are to get back to normal and be able to do things for longer periods.  OBJECTIVE:    Pt brought in her BP cuff to make sure it is calibrated correctly.  It appears so, with measures noted below. Pt brings in adjustable 2.5 # ankle weights she bought at [employer].  Discussed adjustable weight ankle weights (that would go from 1-5 or 1-10 pounds would be better)  TODAY'S TREATMENT: 09/19/2022 Activity Comments  BP (her cuff):  137/82, HR 72 BP  (clinic cuff):  134/83 HR 66   Seated leg strengthening: -marching in place  2 x 10,  2# -LAQ 2 x 10, 2# -ankle pumps 2 x 10 Good return demo understanding, O2 sats 96%  Sit <>stand x 5 reps Good return demo  Standing heel/toe raises x 10 2 sets  Standing hip abduction x 10 Hip extension x 10 Hamstring curls x 10 2 sets  Sidestepping at counter, 3 reps BUE support, O2 sats 96%, HR 66 bpm      Access Code: [email or web address] URL: [email or web address] Date: 09/19/2022 Prepared by: [facility] - Outpatient  Rehab - [facility]  Exercises - Seated Long Arc Quad  - 1 x daily - 7 x weekly - 3 sets - 10 reps - Seated March  - 1 x daily - 7 x weekly - 3 sets - 10 reps - Seated Ankle Dorsiflexion AROM  - 1 x daily - 7 x weekly - 3 sets - 10 reps - Sit to Stand  - 1 x daily - 7 x weekly - 3 sets - 5 reps - Standing Hip Abduction with Counter Support  - 1 x daily - 5 x weekly - 2-3 sets - 10 reps - Standing Hip Extension with Counter Support  - 1 x daily - 5 x weekly - 2-3 sets - 10 reps  PATIENT EDUCATION: Education details: HEP additions -see above Person educated: Patient Education method: Explanation, Demonstration, Tactile cues, Verbal cues, and Handouts Education comprehension: verbalized understanding, returned demonstration, verbal cues required, and tactile cues required     Below measures were taken at time of initial evaluation unless otherwise specified:   DIAGNOSTIC FINDINGS: MRI findings  Moderate to severe perisylvian and moderate mesial parietal atrophy; progressed since 2017. - Moderate periventricular and subcortical chronic small vessel ischemic disease.  - Stable left inferior frontal encephalomalacia and gliosis. - No acute findings.  COGNITION: Overall cognitive status: History of cognitive impairments - at baseline   SENSATION: Light touch: WFL  COORDINATION: WFL  VITALS: 96% O2, 72 bpm HR, BP 115/75  POSTURE: rounded shoulders and forward head  LOWER EXTREMITY ROM:   WFL A/ROM BLES   LOWER EXTREMITY MMT:    MMT  Right Eval Left Eval  Hip  flexion 4 4  Hip extension    Hip abduction 4 4  Hip adduction 4 4  Hip internal rotation    Hip external rotation    Knee flexion 4 4  Knee extension 4 4  Ankle dorsiflexion 4 4  Ankle plantarflexion    Ankle inversion    Ankle eversion    (Blank rows = not tested)    TRANSFERS: Assistive device utilized: None  Sit to stand: Modified independence Stand to sit: Modified independence  GAIT: Gait pattern: step through pattern and wide BOS Distance walked: 200 ft Assistive device utilized: Single point cane Level of assistance: SBA Comments: Occasional slowed pace to turn and sequence cane  FUNCTIONAL TESTS:  5 times sit to stand: 19.62 sec arms crossed at chest Timed up and go (TUG): 32.37 sec with cane 2 minute walk test: 150 ft 10 meter walk test: 21.5 Vitals after 2 MWT:  92%>96%, HR 75 bpm   TODAY'S TREATMENT:                                                                                                                              DATE: 08/27/2022    PATIENT EDUCATION: Education details: Initial eval results, POC, slowed pursed lip breathing Person educated: Patient Education method: Explanation Education comprehension: verbalized understanding  HOME EXERCISE PROGRAM: Not yet initiate  GOALS: Goals reviewed with patient? Yes  SHORT TERM GOALS: Target date: 09/26/2022  Pt will be independent with HEP for improved balance, strength, gait. Baseline: Goal status: IN PROGRESS  2.  Pt will improve 5x sit<>stand to less than or equal to 15 sec to demonstrate improved functional strength and transfer efficiency. Baseline: 19.62 sec Goal status: IN PROGRESS  3.  Pt will improve TUG score to less than or equal to 25 sec for decreased fall risk. Baseline: 32.37 sec Goal status: IN PROGRESS  LONG TERM GOALS: Target date: 10/24/2022  Pt will be independent with HEP for improved balance, strength, gait. Baseline:  Goal status: IN  PROGRESS  2.  Pt will improve 5x sit<>stand to less than or equal to 13 sec to demonstrate improved functional strength and transfer efficiency. Baseline: 19.62 sec Goal status: IN PROGRESS  3.  Pt will improve TUG score to less than or equal to 15 sec for decreased fall risk. Baseline: 32.37 sec Goal status: IN PROGRESS  4.  Pt will improve gait velocity to at least 2 ft/sec for improved gait efficiency and safety. Baseline: 1.53 ft/sec Goal status: IN PROGRESS  5.  Pt will ambulate at least 200 ft in 2 minute walk test to improve gait efficiency and endurance for community ambulation. Baseline: 150 ft Goal status: IN PROGRESS  ASSESSMENT:  CLINICAL IMPRESSION: Skilled PT session today focused on reviewing seated exercises and progressing to standing balance/strengthening.  Vitals remain WFL throughout session.  She is able to perform 2 sets of all standing exercises, no weight, and added to HEP.  She  does require several brief sitting breaks between exercises, no complaints upon leaving session.   OBJECTIVE IMPAIRMENTS: Abnormal gait, decreased balance, decreased mobility, difficulty walking, and decreased strength.   ACTIVITY LIMITATIONS: standing, transfers, reach over head, and locomotion level  PARTICIPATION LIMITATIONS: meal prep, cleaning, laundry, and community activity  PERSONAL FACTORS: 3+ comorbidities: see above for PMH  are also affecting patient's functional outcome.   REHAB POTENTIAL: Good  CLINICAL DECISION MAKING: Evolving/moderate complexity  EVALUATION COMPLEXITY: Moderate  PLAN:  PT FREQUENCY: 2x/week  PT DURATION: 8 weeks plus eval  PLANNED INTERVENTIONS: Therapeutic exercises, Therapeutic activity, Neuromuscular re-education, Balance training, Gait training, Patient/Family education, Self Care, and DME instructions  PLAN FOR NEXT SESSION: Review updates to HEP and continue to work on progression of lower extremity strength and balance exercises; gait  training with cane.  Monitor vitals  Lonia Blood, PT 09/19/22 11:51 AM Phone: [phone] Fax: [phone]  [facility] Outpatient Rehab at [facility] [address] [place], [address] [place], [place] [address] Phone # [phone] Fax # [phone]

## 2022-09-22 ENCOUNTER — Encounter: Payer: Self-pay | Admitting: Physical Therapy

## 2022-09-22 ENCOUNTER — Telehealth: Payer: Self-pay | Admitting: Physical Therapy

## 2022-09-22 ENCOUNTER — Ambulatory Visit: Payer: Medicare Other | Admitting: Physical Therapy

## 2022-09-22 DIAGNOSIS — R2689 Other abnormalities of gait and mobility: Secondary | ICD-10-CM | POA: Diagnosis not present

## 2022-09-22 DIAGNOSIS — M6281 Muscle weakness (generalized): Secondary | ICD-10-CM | POA: Diagnosis not present

## 2022-09-22 DIAGNOSIS — R2681 Unsteadiness on feet: Secondary | ICD-10-CM

## 2022-09-22 NOTE — Telephone Encounter (Signed)
Hi Dr. Atha Starks & Dr. Everardo All,   Becky [name] is being seen in OPPT s/p COVID. She reports episodes of lightheadedness. Please see orthostatic testing below and advise if any changes to POC.             Orthostatic Testing    Supine Sitting Standing  x1 Minute Standing x 3 Minutes  BP 130/75 mmHg 108/71 *light 140/75 145/76  HR 64 bpm 64 66 66       Thanks!    Anette Guarneri, PT, DPT 09/22/22 5:37 PM   Outpatient Rehab at [facility] [address] [place], [address] [place], [place] [address] Phone # [phone] Fax # [phone]

## 2022-09-23 DIAGNOSIS — K3184 Gastroparesis: Secondary | ICD-10-CM | POA: Diagnosis not present

## 2022-09-23 DIAGNOSIS — R110 Nausea: Secondary | ICD-10-CM | POA: Diagnosis not present

## 2022-09-23 DIAGNOSIS — R6881 Early satiety: Secondary | ICD-10-CM | POA: Diagnosis not present

## 2022-09-23 NOTE — Telephone Encounter (Addendum)
No changes to medication at this point since BP normalized with standing. Would not hold any anti-hypertensive agents since she has history of elevated SBP >170s in prior notes. Encourage hydration and breaks between transitions to minimize risk of falls. Will defer to PCP for further work-up as indicated for her dizziness.

## 2022-09-25 ENCOUNTER — Ambulatory Visit: Payer: Medicare Other | Admitting: Physical Therapy

## 2022-09-25 DIAGNOSIS — G319 Degenerative disease of nervous system, unspecified: Secondary | ICD-10-CM | POA: Diagnosis not present

## 2022-09-25 DIAGNOSIS — I10 Essential (primary) hypertension: Secondary | ICD-10-CM | POA: Diagnosis not present

## 2022-09-29 NOTE — Therapy (Signed)
OUTPATIENT PHYSICAL THERAPY NEURO TREATMENT   Patient Name: Becky [name] MRN: [id number] DOB:1951-01-24, 72 y.o., female Today's Date: 09/30/2022   PCP: Daisy Floro, MD REFERRING PROVIDER: Luciano Cutter, MD   END OF SESSION:  PT End of Session - 09/30/22 1440     Visit Number 6    Number of Visits 17    Date for PT Re-Evaluation 10/24/22    Authorization Type [employer] Medicare    PT Start Time 1357    PT Stop Time 1442    PT Time Calculation (min) 45 min    Equipment Utilized During Treatment Gait belt    Activity Tolerance Patient tolerated treatment well    Behavior During Therapy [facility] for tasks assessed/performed                  Past Medical History:  Diagnosis Date   Chronic kidney disease    CVA (cerebral vascular accident) (HCC) 11/14/2013   Depression    followed by Dr Arletha Pili frequently 10/01/2015   Genital herpes    History of melanoma    followed by derm Dr Emily Filbert   Hyperlipidemia    Hypertension    MCI (mild cognitive impairment) 10/01/2015   Pure hypercholesterolemia 11/14/2013   Stroke [facility])    Old stroke seen on MRI 12/2009, carotid doppler w minimal plaque, MRI off the head otherwise neg for acute disease.   Thyroid disease    hypothyroid. Followed by Dr Evelene Croon   TIA (transient ischemic attack) 8/08   followed by neurologist Dr Pearlean Brownie   Past Surgical History:  Procedure Laterality Date   ABDOMINAL HYSTERECTOMY     APPENDECTOMY     CHOLECYSTECTOMY     MELANOMA EXCISION     x2   Patient Active Problem List   Diagnosis Date Noted   Chronic fatigue 09/11/2021   Shortness of breath at rest 08/17/2021   Moderate persistent asthma 05/06/2021   History of COVID-19 03/19/2021   COVID-19 long hauler manifesting chronic dyspnea 03/19/2021   Thyroid disease    Stroke [facility])    Hypertension    Hyperlipidemia    History of melanoma    Genital herpes    Depression    Chronic kidney disease    MCI (mild cognitive impairment)  10/01/2015   Falls frequently 10/01/2015   Essential hypertension, benign 11/14/2013   CVA (cerebral vascular accident) (HCC) 11/14/2013   Pure hypercholesterolemia 11/14/2013   TIA (transient ischemic attack) 10/02/2006    ONSET DATE: 07/24/2022 MD referral  REFERRING DIAG: R29.898 (ICD-10-CM) - Muscular deconditioning   THERAPY DIAG:  Muscle weakness (generalized)  Other abnormalities of gait and mobility  Unsteadiness on feet  Rationale for Evaluation and Treatment: Rehabilitation  SUBJECTIVE:  SUBJECTIVE STATEMENT: "I don't have much energy and have been sick for the past few days." Reports nausea and suspects that this is d/t one of her medications. Denies questions/concerns on HEP.   Pt accompanied by: self  PERTINENT HISTORY: hx COVID 08/2020 followed by post-viral asthma, hx CVA, hx melanoma, HTN, CKD, anxiety, depression  PAIN:  Are you having pain? No Some muscle soreness in hips  PRECAUTIONS: Fall and Other: asthma  WEIGHT BEARING RESTRICTIONS: No  FALLS: Has patient fallen in last 6 months? Yes. Number of falls 2-3  LIVING ENVIRONMENT: Lives with: lives alone Lives in: Other 1st floor condo Stairs: No Has following equipment at home: Single point cane  PLOF: Independent  Does have a lady that cleans periodically Prior to Covid 2022-was very active  PATIENT GOALS: Pt's goals for therapy are to get back to normal and be able to do things for longer periods.  OBJECTIVE:     TODAY'S TREATMENT: 09/30/22 Activity Comments  Vitals at start of session  115/70 mmHg; 93% spO2, 60 bpm  5xSTS 18.56 sec without UEs; posterior LOB on last rep; 2nd trial: 15.17 sec   TUG 26.47 sec with SPC  20.55 sec without AD  nustep L4 x 6 min UEs/LEs  Maintaining ~35-40 SPM Vitals: 110/78  mmHg, 97% spO2, 58 bpm  STS to elevated mat + green medball 5x ;squat to elevated mat + green medball 5x Cueing for increased anterior trunk lean to prevent retropulsion  alt step ups 6" step 3x30" With 1 UE support; good stability, slow speed   alt toe taps on step 2# 2x30" 1 UE support      PATIENT EDUCATION: Education details: discussion on objective progress and remaining impairments  Person educated: Patient Education method: Explanation Education comprehension: verbalized understanding   HOME EXERCISE PROGRAM Last updated: 09/22/22  Access Code: [email or web address] URL: [email or web address] Date: 09/22/2022 Prepared by: [facility] - Outpatient  Rehab - [facility]  Exercises - Seated Long Arc Quad  - 1 x daily - 7 x weekly - 3 sets - 10 reps - Seated March  - 1 x daily - 7 x weekly - 3 sets - 10 reps - Seated Ankle Dorsiflexion AROM  - 1 x daily - 7 x weekly - 3 sets - 10 reps - Standing Hip Abduction with Counter Support  - 1 x daily - 5 x weekly - 2-3 sets - 10 reps - Standing Hip Extension with Counter Support  - 1 x daily - 5 x weekly - 2-3 sets - 10 reps - Squat with Chair Touch  - 1 x daily - 5 x weekly - 2 sets - 10 reps    Below measures were taken at time of initial evaluation unless otherwise specified:   DIAGNOSTIC FINDINGS: MRI findings  Moderate to severe perisylvian and moderate mesial parietal atrophy; progressed since 2017. - Moderate periventricular and subcortical chronic small vessel ischemic disease.  - Stable left inferior frontal encephalomalacia and gliosis. - No acute findings.  COGNITION: Overall cognitive status: History of cognitive impairments - at baseline   SENSATION: Light touch: WFL  COORDINATION: WFL  VITALS: 96% O2, 72 bpm HR, BP 115/75  POSTURE: rounded shoulders and forward head  LOWER EXTREMITY ROM:   WFL A/ROM BLES   LOWER EXTREMITY MMT:    MMT Right Eval Left Eval  Hip flexion 4 4  Hip extension     Hip abduction 4 4  Hip adduction 4 4  Hip internal rotation  Hip external rotation    Knee flexion 4 4  Knee extension 4 4  Ankle dorsiflexion 4 4  Ankle plantarflexion    Ankle inversion    Ankle eversion    (Blank rows = not tested)    TRANSFERS: Assistive device utilized: None  Sit to stand: Modified independence Stand to sit: Modified independence  GAIT: Gait pattern: step through pattern and wide BOS Distance walked: 200 ft Assistive device utilized: Single point cane Level of assistance: SBA Comments: Occasional slowed pace to turn and sequence cane  FUNCTIONAL TESTS:  5 times sit to stand: 19.62 sec arms crossed at chest Timed up and go (TUG): 32.37 sec with cane 2 minute walk test: 150 ft 10 meter walk test: 21.5 Vitals after 2 MWT:  92%>96%, HR 75 bpm   TODAY'S TREATMENT:                                                                                                                              DATE: 08/27/2022    PATIENT EDUCATION: Education details: Initial eval results, POC, slowed pursed lip breathing Person educated: Patient Education method: Explanation Education comprehension: verbalized understanding  HOME EXERCISE PROGRAM: Not yet initiate  GOALS: Goals reviewed with patient? Yes  SHORT TERM GOALS: Target date: 09/26/2022  Pt will be independent with HEP for improved balance, strength, gait. Baseline: Goal status: MET 09/30/22  2.  Pt will improve 5x sit<>stand to less than or equal to 15 sec to demonstrate improved functional strength and transfer efficiency. Baseline: 19.62 sec; 15.17 sec 09/30/22 Goal status: MET 09/30/22  3.  Pt will improve TUG score to less than or equal to 25 sec for decreased fall risk. Baseline: 32.37 sec; 20.55 without SPC 09/30/22 Goal status:  MET 09/30/22  LONG TERM GOALS: Target date: 10/24/2022  Pt will be independent with HEP for improved balance, strength, gait. Baseline:  Goal status: IN  PROGRESS  2.  Pt will improve 5x sit<>stand to less than or equal to 13 sec to demonstrate improved functional strength and transfer efficiency. Baseline: 19.62 sec; 15.17 sec 09/30/22 Goal status: IN PROGRESS 09/30/22  3.  Pt will improve TUG score to less than or equal to 15 sec for decreased fall risk. Baseline: 32.37 sec; 20.55 sec 09/30/22 Goal status: IN PROGRESS 09/30/22  4.  Pt will improve gait velocity to at least 2 ft/sec for improved gait efficiency and safety. Baseline: 1.53 ft/sec Goal status: IN PROGRESS  5.  Pt will ambulate at least 200 ft in 2 minute walk test to improve gait efficiency and endurance for community ambulation. Baseline: 150 ft Goal status: IN PROGRESS  ASSESSMENT:  CLINICAL IMPRESSION: Patient arrived to session with report of some nausea for the past few days; suspects it is d/t one of her medications. STGs were assessed and were all met today. Able to demonstrate improved speed with transfers and short distance gait today. Worked on progressive LE strengthening with cueing  on proper form with STS transfers. Reduced episodes of retropulsion evident after edu. Patient still requires 1 UE support with balance tasks d/t balance confidence. No complaints at end of session. Patient is progressing well towards goals.  OBJECTIVE IMPAIRMENTS: Abnormal gait, decreased balance, decreased mobility, difficulty walking, and decreased strength.   ACTIVITY LIMITATIONS: standing, transfers, reach over head, and locomotion level  PARTICIPATION LIMITATIONS: meal prep, cleaning, laundry, and community activity  PERSONAL FACTORS: 3+ comorbidities: see above for PMH  are also affecting patient's functional outcome.   REHAB POTENTIAL: Good  CLINICAL DECISION MAKING: Evolving/moderate complexity  EVALUATION COMPLEXITY: Moderate  PLAN:  PT FREQUENCY: 2x/week  PT DURATION: 8 weeks plus eval  PLANNED INTERVENTIONS: Therapeutic exercises, Therapeutic activity,  Neuromuscular re-education, Balance training, Gait training, Patient/Family education, Self Care, and DME instructions  PLAN FOR NEXT SESSION: Review updates to HEP and continue to work on progression of lower extremity strength and balance exercises; gait training with cane.  Monitor vitals    Anette Guarneri, PT, DPT 09/30/22 2:44 PM  [facility] at [facility] [address] [place], [address] [place], [place] [address] Phone # [phone] Fax # [phone]

## 2022-09-30 ENCOUNTER — Encounter: Payer: Self-pay | Admitting: Physical Therapy

## 2022-09-30 ENCOUNTER — Ambulatory Visit: Payer: Medicare Other | Admitting: Physical Therapy

## 2022-09-30 DIAGNOSIS — R2689 Other abnormalities of gait and mobility: Secondary | ICD-10-CM | POA: Diagnosis not present

## 2022-09-30 DIAGNOSIS — M6281 Muscle weakness (generalized): Secondary | ICD-10-CM

## 2022-09-30 DIAGNOSIS — R2681 Unsteadiness on feet: Secondary | ICD-10-CM

## 2022-10-01 DIAGNOSIS — H34831 Tributary (branch) retinal vein occlusion, right eye, with macular edema: Secondary | ICD-10-CM | POA: Diagnosis not present

## 2022-10-08 ENCOUNTER — Ambulatory Visit: Payer: Medicare Other

## 2022-10-14 ENCOUNTER — Encounter: Payer: Self-pay | Admitting: Physical Therapy

## 2022-10-14 ENCOUNTER — Ambulatory Visit: Payer: Medicare Other | Attending: Pulmonary Disease | Admitting: Physical Therapy

## 2022-10-14 DIAGNOSIS — M6281 Muscle weakness (generalized): Secondary | ICD-10-CM | POA: Insufficient documentation

## 2022-10-14 DIAGNOSIS — R2681 Unsteadiness on feet: Secondary | ICD-10-CM | POA: Diagnosis not present

## 2022-10-14 DIAGNOSIS — R2689 Other abnormalities of gait and mobility: Secondary | ICD-10-CM | POA: Insufficient documentation

## 2022-10-14 NOTE — Therapy (Signed)
OUTPATIENT PHYSICAL THERAPY NEURO TREATMENT   Patient Name: Becky [name] MRN: [id number] DOB:1950/08/25, 72 y.o., female Today's Date: 10/14/2022   PCP: Daisy Floro, MD REFERRING PROVIDER: Luciano Cutter, MD   END OF SESSION:  PT End of Session - 10/14/22 1513     Visit Number 7    Number of Visits 17    Date for PT Re-Evaluation 10/24/22    Authorization Type [employer] Medicare    PT Start Time 1449    PT Stop Time 1529    PT Time Calculation (min) 40 min    Activity Tolerance Patient tolerated treatment well    Behavior During Therapy [facility] for tasks assessed/performed                   Past Medical History:  Diagnosis Date   Chronic kidney disease    CVA (cerebral vascular accident) (HCC) 11/14/2013   Depression    followed by Dr Arletha Pili frequently 10/01/2015   Genital herpes    History of melanoma    followed by derm Dr Emily Filbert   Hyperlipidemia    Hypertension    MCI (mild cognitive impairment) 10/01/2015   Pure hypercholesterolemia 11/14/2013   Stroke [facility])    Old stroke seen on MRI 12/2009, carotid doppler w minimal plaque, MRI off the head otherwise neg for acute disease.   Thyroid disease    hypothyroid. Followed by Dr Evelene Croon   TIA (transient ischemic attack) 8/08   followed by neurologist Dr Pearlean Brownie   Past Surgical History:  Procedure Laterality Date   ABDOMINAL HYSTERECTOMY     APPENDECTOMY     CHOLECYSTECTOMY     MELANOMA EXCISION     x2   Patient Active Problem List   Diagnosis Date Noted   Chronic fatigue 09/11/2021   Shortness of breath at rest 08/17/2021   Moderate persistent asthma 05/06/2021   History of COVID-19 03/19/2021   COVID-19 long hauler manifesting chronic dyspnea 03/19/2021   Thyroid disease    Stroke [facility])    Hypertension    Hyperlipidemia    History of melanoma    Genital herpes    Depression    Chronic kidney disease    MCI (mild cognitive impairment) 10/01/2015   Falls frequently 10/01/2015    Essential hypertension, benign 11/14/2013   CVA (cerebral vascular accident) (HCC) 11/14/2013   Pure hypercholesterolemia 11/14/2013   TIA (transient ischemic attack) 10/02/2006    ONSET DATE: 07/24/2022 MD referral  REFERRING DIAG: R29.898 (ICD-10-CM) - Muscular deconditioning   THERAPY DIAG:  Muscle weakness (generalized)  Other abnormalities of gait and mobility  Unsteadiness on feet  Rationale for Evaluation and Treatment: Rehabilitation  SUBJECTIVE:  SUBJECTIVE STATEMENT:  Not much new since last time, I've been walking a lot. Of all things I'm sore. I didn't come the other day bc before that I was at the store walking around for about 2 hours. I feel like that shouldn't make me sore.   Pt accompanied by: self  PERTINENT HISTORY: hx COVID 08/2020 followed by post-viral asthma, hx CVA, hx melanoma, HTN, CKD, anxiety, depression  PAIN:  Are you having pain? No Some muscle soreness in hips  PRECAUTIONS: Fall and Other: asthma  WEIGHT BEARING RESTRICTIONS: No  FALLS: Has patient fallen in last 6 months? Yes. Number of falls 2-3  LIVING ENVIRONMENT: Lives with: lives alone Lives in: Other 1st floor condo Stairs: No Has following equipment at home: Single point cane  PLOF: Independent  Does have a lady that cleans periodically Prior to Covid 2022-was very active  PATIENT GOALS: Pt's goals for therapy are to get back to normal and be able to do things for longer periods.  OBJECTIVE:     TODAY'S TREATMENT:   10/14/22  BP 116/88 HR 65 O2   TherEx  Nustep L4x6 minutes BLEs only Bridges staggered stance 2x10  Sidelying ABD x10 B  STS with eccentric lower x12, SOB post with HR/SpO2 WNL  NMR  Tandem stance blue foam pad 3x30 seconds B  Attempted tandem gait- unable Narrow  BOS solid surface EC 2x60 seconds         09/30/22 Activity Comments  Vitals at start of session  115/70 mmHg; 93% spO2, 60 bpm  5xSTS 18.56 sec without UEs; posterior LOB on last rep; 2nd trial: 15.17 sec   TUG 26.47 sec with SPC  20.55 sec without AD  nustep L4 x 6 min UEs/LEs  Maintaining ~35-40 SPM Vitals: 110/78 mmHg, 97% spO2, 58 bpm  STS to elevated mat + green medball 5x ;squat to elevated mat + green medball 5x Cueing for increased anterior trunk lean to prevent retropulsion  alt step ups 6" step 3x30" With 1 UE support; good stability, slow speed   alt toe taps on step 2# 2x30" 1 UE support      PATIENT EDUCATION: Education details: discussion on objective progress and remaining impairments  Person educated: Patient Education method: Explanation Education comprehension: verbalized understanding   HOME EXERCISE PROGRAM Last updated: 09/22/22  Access Code: [email or web address] URL: [email or web address] Date: 09/22/2022 Prepared by: [facility] - Outpatient  Rehab - [facility]  Exercises - Seated Long Arc Quad  - 1 x daily - 7 x weekly - 3 sets - 10 reps - Seated March  - 1 x daily - 7 x weekly - 3 sets - 10 reps - Seated Ankle Dorsiflexion AROM  - 1 x daily - 7 x weekly - 3 sets - 10 reps - Standing Hip Abduction with Counter Support  - 1 x daily - 5 x weekly - 2-3 sets - 10 reps - Standing Hip Extension with Counter Support  - 1 x daily - 5 x weekly - 2-3 sets - 10 reps - Squat with Chair Touch  - 1 x daily - 5 x weekly - 2 sets - 10 reps    Below measures were taken at time of initial evaluation unless otherwise specified:   DIAGNOSTIC FINDINGS: MRI findings  Moderate to severe perisylvian and moderate mesial parietal atrophy; progressed since 2017. - Moderate periventricular and subcortical chronic small vessel ischemic disease.  - Stable left inferior frontal encephalomalacia and gliosis. - No acute findings.  COGNITION:  Overall cognitive  status: History of cognitive impairments - at baseline   SENSATION: Light touch: WFL  COORDINATION: WFL  VITALS: 96% O2, 72 bpm HR, BP 115/75  POSTURE: rounded shoulders and forward head  LOWER EXTREMITY ROM:   WFL A/ROM BLES   LOWER EXTREMITY MMT:    MMT Right Eval Left Eval  Hip flexion 4 4  Hip extension    Hip abduction 4 4  Hip adduction 4 4  Hip internal rotation    Hip external rotation    Knee flexion 4 4  Knee extension 4 4  Ankle dorsiflexion 4 4  Ankle plantarflexion    Ankle inversion    Ankle eversion    (Blank rows = not tested)    TRANSFERS: Assistive device utilized: None  Sit to stand: Modified independence Stand to sit: Modified independence  GAIT: Gait pattern: step through pattern and wide BOS Distance walked: 200 ft Assistive device utilized: Single point cane Level of assistance: SBA Comments: Occasional slowed pace to turn and sequence cane  FUNCTIONAL TESTS:  5 times sit to stand: 19.62 sec arms crossed at chest Timed up and go (TUG): 32.37 sec with cane 2 minute walk test: 150 ft 10 meter walk test: 21.5 Vitals after 2 MWT:  92%>96%, HR 75 bpm   TODAY'S TREATMENT:                                                                                                                              DATE: 08/27/2022    PATIENT EDUCATION: Education details: Initial eval results, POC, slowed pursed lip breathing Person educated: Patient Education method: Explanation Education comprehension: verbalized understanding  HOME EXERCISE PROGRAM: Not yet initiate  GOALS: Goals reviewed with patient? Yes  SHORT TERM GOALS: Target date: 09/26/2022  Pt will be independent with HEP for improved balance, strength, gait. Baseline: Goal status: MET 09/30/22  2.  Pt will improve 5x sit<>stand to less than or equal to 15 sec to demonstrate improved functional strength and transfer efficiency. Baseline: 19.62 sec; 15.17 sec 09/30/22 Goal status:  MET 09/30/22  3.  Pt will improve TUG score to less than or equal to 25 sec for decreased fall risk. Baseline: 32.37 sec; 20.55 without SPC 09/30/22 Goal status:  MET 09/30/22  LONG TERM GOALS: Target date: 10/24/2022  Pt will be independent with HEP for improved balance, strength, gait. Baseline:  Goal status: IN PROGRESS  2.  Pt will improve 5x sit<>stand to less than or equal to 13 sec to demonstrate improved functional strength and transfer efficiency. Baseline: 19.62 sec; 15.17 sec 09/30/22 Goal status: IN PROGRESS 09/30/22  3.  Pt will improve TUG score to less than or equal to 15 sec for decreased fall risk. Baseline: 32.37 sec; 20.55 sec 09/30/22 Goal status: IN PROGRESS 09/30/22  4.  Pt will improve gait velocity to at least 2 ft/sec for improved gait efficiency and safety. Baseline: 1.53 ft/sec Goal status: IN PROGRESS  5.  Pt will ambulate at least 200 ft in 2 minute walk test to improve gait efficiency and endurance for community ambulation. Baseline: 150 ft Goal status: IN PROGRESS  ASSESSMENT:  CLINICAL IMPRESSION:  Pt arrived today doing well, less nauseous today overall. Vitals WNL. We continued working on functional strength and balance as per POC with rest breaks PRN. Did well and motivated to improve, will continue to progress as able.  OBJECTIVE IMPAIRMENTS: Abnormal gait, decreased balance, decreased mobility, difficulty walking, and decreased strength.   ACTIVITY LIMITATIONS: standing, transfers, reach over head, and locomotion level  PARTICIPATION LIMITATIONS: meal prep, cleaning, laundry, and community activity  PERSONAL FACTORS: 3+ comorbidities: see above for PMH  are also affecting patient's functional outcome.   REHAB POTENTIAL: Good  CLINICAL DECISION MAKING: Evolving/moderate complexity  EVALUATION COMPLEXITY: Moderate  PLAN:  PT FREQUENCY: 2x/week  PT DURATION: 8 weeks plus eval  PLANNED INTERVENTIONS: Therapeutic exercises, Therapeutic  activity, Neuromuscular re-education, Balance training, Gait training, Patient/Family education, Self Care, and DME instructions  PLAN FOR NEXT SESSION: Review updates to HEP and continue to work on progression of lower extremity strength and balance exercises; gait training with cane.  Monitor vitals   Nedra Hai, PT, DPT 10/14/22 3:34 PM   [facility] at [facility] [address] [place], [address] [place], [place] [address] Phone # [phone] Fax # [phone]

## 2022-10-16 ENCOUNTER — Ambulatory Visit: Payer: Medicare Other

## 2022-10-16 ENCOUNTER — Ambulatory Visit: Payer: Medicare Other | Admitting: Physical Therapy

## 2022-10-21 ENCOUNTER — Ambulatory Visit: Payer: Medicare Other | Admitting: Physical Therapy

## 2022-10-22 ENCOUNTER — Encounter: Payer: Self-pay | Admitting: Physical Therapy

## 2022-10-22 ENCOUNTER — Ambulatory Visit: Payer: Medicare Other | Admitting: Physical Therapy

## 2022-10-22 DIAGNOSIS — M6281 Muscle weakness (generalized): Secondary | ICD-10-CM

## 2022-10-22 DIAGNOSIS — R2681 Unsteadiness on feet: Secondary | ICD-10-CM | POA: Diagnosis not present

## 2022-10-22 DIAGNOSIS — R2689 Other abnormalities of gait and mobility: Secondary | ICD-10-CM | POA: Diagnosis not present

## 2022-10-22 NOTE — Therapy (Signed)
OUTPATIENT PHYSICAL THERAPY NEURO TREATMENT/PROGRESS NOTE/RECERT   Patient Name: Becky [name] MRN: [id number] DOB:18-Mar-1950, 72 y.o., female Today's Date: 10/22/2022   PCP: Daisy Floro, MD REFERRING PROVIDER: Luciano Cutter, MD    Progress Note Reporting Period 08/27/2022 to 10/22/2022  See note below for Objective Data and Assessment of Progress/Goals.     END OF SESSION:  PT End of Session - 10/22/22 1348     Visit Number 8    Number of Visits 16    Date for PT Re-Evaluation 10/24/22    Authorization Type [employer] Medicare    Progress Note Due on Visit 18   if needed   PT Start Time 1355    PT Stop Time 1440    PT Time Calculation (min) 45 min    Activity Tolerance Patient tolerated treatment well   fatigued at end of session   Behavior During Therapy [facility] for tasks assessed/performed                    Past Medical History:  Diagnosis Date   Chronic kidney disease    CVA (cerebral vascular accident) (HCC) 11/14/2013   Depression    followed by Dr Arletha Pili frequently 10/01/2015   Genital herpes    History of melanoma    followed by derm Dr Emily Filbert   Hyperlipidemia    Hypertension    MCI (mild cognitive impairment) 10/01/2015   Pure hypercholesterolemia 11/14/2013   Stroke [facility])    Old stroke seen on MRI 12/2009, carotid doppler w minimal plaque, MRI off the head otherwise neg for acute disease.   Thyroid disease    hypothyroid. Followed by Dr Evelene Croon   TIA (transient ischemic attack) 8/08   followed by neurologist Dr Pearlean Brownie   Past Surgical History:  Procedure Laterality Date   ABDOMINAL HYSTERECTOMY     APPENDECTOMY     CHOLECYSTECTOMY     MELANOMA EXCISION     x2   Patient Active Problem List   Diagnosis Date Noted   Chronic fatigue 09/11/2021   Shortness of breath at rest 08/17/2021   Moderate persistent asthma 05/06/2021   History of COVID-19 03/19/2021   COVID-19 long hauler manifesting chronic dyspnea 03/19/2021   Thyroid  disease    Stroke [facility])    Hypertension    Hyperlipidemia    History of melanoma    Genital herpes    Depression    Chronic kidney disease    MCI (mild cognitive impairment) 10/01/2015   Falls frequently 10/01/2015   Essential hypertension, benign 11/14/2013   CVA (cerebral vascular accident) (HCC) 11/14/2013   Pure hypercholesterolemia 11/14/2013   TIA (transient ischemic attack) 10/02/2006    ONSET DATE: 07/24/2022 MD referral  REFERRING DIAG: R29.898 (ICD-10-CM) - Muscular deconditioning   THERAPY DIAG:  Muscle weakness (generalized)  Other abnormalities of gait and mobility  Unsteadiness on feet  Rationale for Evaluation and Treatment: Rehabilitation  SUBJECTIVE:  SUBJECTIVE STATEMENT: Definitely still need to work on balance and want to still work on the strength.  Some days I do really good and some days I'm just very sick and nauseated.  Feel like therapy is helping some. Don't have my cane today; not sure where it is.  Pt accompanied by: self  PERTINENT HISTORY: hx COVID 08/2020 followed by post-viral asthma, hx CVA, hx melanoma, HTN, CKD, anxiety, depression  PAIN:  Are you having pain? No Some muscle soreness in hips  PRECAUTIONS: Fall and Other: asthma  WEIGHT BEARING RESTRICTIONS: No  FALLS: Has patient fallen in last 6 months? Yes. Number of falls 2-3  LIVING ENVIRONMENT: Lives with: lives alone Lives in: Other 1st floor condo Stairs: No Has following equipment at home: Single point cane  PLOF: Independent  Does have a lady that cleans periodically Prior to Covid 2022-was very active  PATIENT GOALS: Pt's goals for therapy are to get back to normal and be able to do things for longer periods.  OBJECTIVE:    TODAY'S TREATMENT: 10/22/2022 Activity Comments   Vitals:  93%, HR 68 bpm; 120/87   :  200 ft Improved from 150 ft  TUG:  18.22 sec (no cane) Improved from 20.5 sec (no cane)  FTSTS:  18  sec Improved from 19.62  Gt velocity:  21.5 sec (1.53 ft/sec) No device  Review of HEP-see below Used 4# weights seated; 3# weights standing  DGI:  11/24 Scores <19/24 indicate increased fall risk  Sidestepping x 2 min     Vitals at end of session:  97%, HR 70 bpm  PATIENT EDUCATION: Education details: discussion on objective progress, remaining impairments, POC and plans to continued towards extended LTGs.  Discussed importance of consistent performance of HEP, with pt reporting she performs 1x/wk Person educated: Patient Education method: Explanation Education comprehension: verbalized understanding   HOME EXERCISE PROGRAM Last updated: 09/22/22  Access Code: [email or web address] URL: [email or web address] Date: 09/22/2022 Prepared by: [facility] - Outpatient  Rehab - [facility]  Exercises - Seated Long Arc Quad  - 1 x daily - 7 x weekly - 3 sets - 10 reps - Seated March  - 1 x daily - 7 x weekly - 3 sets - 10 reps - Seated Ankle Dorsiflexion AROM  - 1 x daily - 7 x weekly - 3 sets - 10 reps - Standing Hip Abduction with Counter Support  - 1 x daily - 5 x weekly - 2-3 sets - 10 reps - Standing Hip Extension with Counter Support  - 1 x daily - 5 x weekly - 2-3 sets - 10 reps - Squat with Chair Touch  - 1 x daily - 5 x weekly - 2 sets - 10 reps    Below measures were taken at time of initial evaluation unless otherwise specified:   DIAGNOSTIC FINDINGS: MRI findings  Moderate to severe perisylvian and moderate mesial parietal atrophy; progressed since 2017. - Moderate periventricular and subcortical chronic small vessel ischemic disease.  - Stable left inferior frontal encephalomalacia and gliosis. - No acute findings.  COGNITION: Overall cognitive status: History of cognitive impairments - at baseline   SENSATION: Light  touch: WFL  COORDINATION: WFL  VITALS: 96% O2, 72 bpm HR, BP 115/75  POSTURE: rounded shoulders and forward head  LOWER EXTREMITY ROM:   WFL A/ROM BLES   LOWER EXTREMITY MMT:    MMT Right Eval Left Eval  Hip flexion 4 4  Hip extension    Hip abduction 4  4  Hip adduction 4 4  Hip internal rotation    Hip external rotation    Knee flexion 4 4  Knee extension 4 4  Ankle dorsiflexion 4 4  Ankle plantarflexion    Ankle inversion    Ankle eversion    (Blank rows = not tested)    TRANSFERS: Assistive device utilized: None  Sit to stand: Modified independence Stand to sit: Modified independence  GAIT: Gait pattern: step through pattern and wide BOS Distance walked: 200 ft Assistive device utilized: Single point cane Level of assistance: SBA Comments: Occasional slowed pace to turn and sequence cane  FUNCTIONAL TESTS:  5 times sit to stand: 19.62 sec arms crossed at chest Timed up and go (TUG): 32.37 sec with cane 2 minute walk test: 150 ft 10 meter walk test: 21.5 Vitals after 2 MWT:  92%>96%, HR 75 bpm   TODAY'S TREATMENT:                                                                                                                              DATE: 08/27/2022    PATIENT EDUCATION: Education details: Initial eval results, POC, slowed pursed lip breathing Person educated: Patient Education method: Explanation Education comprehension: verbalized understanding  HOME EXERCISE PROGRAM: Not yet initiate  GOALS: Goals reviewed with patient? Yes  SHORT TERM GOALS: Target date: 09/26/2022  Pt will be independent with HEP for improved balance, strength, gait. Baseline: Goal status: MET 09/30/22  2.  Pt will improve 5x sit<>stand to less than or equal to 15 sec to demonstrate improved functional strength and transfer efficiency. Baseline: 19.62 sec; 15.17 sec 09/30/22 Goal status: MET 09/30/22  3.  Pt will improve TUG score to less than or equal to 25 sec  for decreased fall risk. Baseline: 32.37 sec; 20.55 without SPC 09/30/22 Goal status:  MET 09/30/22  LONG TERM GOALS: Target date: 10/24/2022>UPDATED TARGET:  11/21/2022  Pt will be independent with HEP for improved balance, strength, gait. Baseline: initiated and pt would benefit from additional balance exercises as part of HEP Goal status: IN PROGRESS, 10/22/2022  2.  Pt will improve 5x sit<>stand to less than or equal to 13 sec to demonstrate improved functional strength and transfer efficiency. Baseline: 19.62 sec; 15.17 sec 09/30/22>18 sec 10/22/2022 Goal status: IN PROGRESS 09/30/22/8; 10/22/2022  3.  Pt will improve TUG score to less than or equal to 15 sec for decreased fall risk. Baseline: 32.37 sec; 20.55 sec 09/30/22>18.22 sec 10/22/2022 Goal status: IN PROGRESS 09/30/22, 10/22/2022  4.  Pt will improve gait velocity to at least 2 ft/sec for improved gait efficiency and safety. Baseline: 1.53 ft/sec (unchanged 10/22/2022) Goal status: IN PROGRESS, 10/22/2022  5.  Pt will ambulate at least 200 ft in 2 minute walk test to improve gait efficiency and endurance for community ambulation.  Baseline: 150 ft > 200 ft in 2 minute walk, 10/22/2022 Goal status: GOAL MET  6.  Pt will ambulate at  least 225 ft in 2 minute walk test to improve gait efficiency and endurance for community ambulation.  Baseline: 150 ft > 200 ft in 2 minute walk, 10/22/2022 Goal status: INITIAL  ASSESSMENT:  CLINICAL IMPRESSION: Pt presents to OPPT today after cancelling several appointments due to not feeling well or fatigue.  Assessed LTGs this visit, with pt meeting 1 of 5 LTG.  She has met LTG 5 for improved distance in 2 MWT.  Her FTSTS, TUG measures are improving, but not to goal level; gait velocity remains unchanged.  DGI assessed today with pt's score of 11/24 indicating increased fall risk.   Pt endorses that she feels that therapy is helping, but she continues to be limited overall with fatigue and wants to  continue to work on strength and balance.  Pt did not have cane in session today, reporting she is unsure where it is.  Advised pt to continue to use cane for optimal stability with gait.  Discussed importance of consistent performance of HEP (pt reports doing HEP about 1x/wk) as well as consistent attendance to therapy sessions for optimal carryover and progression towards goals.  She will continue to benefit from skilled PT towards goals for improved functional mobility, balance, strength and gait for decreased fall risk and improved independence with mobility. OBJECTIVE IMPAIRMENTS: Abnormal gait, decreased balance, decreased mobility, difficulty walking, and decreased strength.   ACTIVITY LIMITATIONS: standing, transfers, reach over head, and locomotion level  PARTICIPATION LIMITATIONS: meal prep, cleaning, laundry, and community activity  PERSONAL FACTORS: 3+ comorbidities: see above for PMH  are also affecting patient's functional outcome.   REHAB POTENTIAL: Good  CLINICAL DECISION MAKING: Evolving/moderate complexity  EVALUATION COMPLEXITY: Moderate  PLAN:  PT FREQUENCY: 2x/week  PT DURATION: 4 weeks per recert 10/22/2022  PLANNED INTERVENTIONS: Therapeutic exercises, Therapeutic activity, Neuromuscular re-education, Balance training, Gait training, Patient/Family education, Self Care, and DME instructions  PLAN FOR NEXT SESSION: Continue to work on progression of lower extremity strength and balance exercises, updating HEP as appropriate; gait training with cane.  Monitor vitals   Lonia Blood, PT 10/22/22 2:54 PM Phone: [phone] Fax: [phone]   [facility] at [facility] [address], [address] [place], [place] [address] Phone # [phone] Fax # [phone]

## 2022-10-23 ENCOUNTER — Ambulatory Visit: Payer: Medicare Other | Admitting: Physical Therapy

## 2022-10-27 NOTE — Therapy (Signed)
OUTPATIENT PHYSICAL THERAPY NEURO TREATMENT   Patient Name: Becky [name] MRN: [id number] DOB:Mar 27, 1950, 72 y.o., female Today's Date: 10/28/2022   PCP: Daisy Floro, MD REFERRING PROVIDER: Luciano Cutter, MD      END OF SESSION:  PT End of Session - 10/28/22 1516     Visit Number 9    Number of Visits 16    Date for PT Re-Evaluation 11/21/22    Authorization Type [employer] Medicare    Progress Note Due on Visit 18   if needed   PT Start Time 1449    PT Stop Time 1527    PT Time Calculation (min) 38 min    Equipment Utilized During Treatment Gait belt    Activity Tolerance Patient tolerated treatment well    Behavior During Therapy WFL for tasks assessed/performed                     Past Medical History:  Diagnosis Date   Chronic kidney disease    CVA (cerebral vascular accident) (HCC) 11/14/2013   Depression    followed by Dr Arletha Pili frequently 10/01/2015   Genital herpes    History of melanoma    followed by derm Dr Emily Filbert   Hyperlipidemia    Hypertension    MCI (mild cognitive impairment) 10/01/2015   Pure hypercholesterolemia 11/14/2013   Stroke [facility])    Old stroke seen on MRI 12/2009, carotid doppler w minimal plaque, MRI off the head otherwise neg for acute disease.   Thyroid disease    hypothyroid. Followed by Dr Evelene Croon   TIA (transient ischemic attack) 8/08   followed by neurologist Dr Pearlean Brownie   Past Surgical History:  Procedure Laterality Date   ABDOMINAL HYSTERECTOMY     APPENDECTOMY     CHOLECYSTECTOMY     MELANOMA EXCISION     x2   Patient Active Problem List   Diagnosis Date Noted   Chronic fatigue 09/11/2021   Shortness of breath at rest 08/17/2021   Moderate persistent asthma 05/06/2021   History of COVID-19 03/19/2021   COVID-19 long hauler manifesting chronic dyspnea 03/19/2021   Thyroid disease    Stroke [facility])    Hypertension    Hyperlipidemia    History of melanoma    Genital herpes    Depression     Chronic kidney disease    MCI (mild cognitive impairment) 10/01/2015   Falls frequently 10/01/2015   Essential hypertension, benign 11/14/2013   CVA (cerebral vascular accident) (HCC) 11/14/2013   Pure hypercholesterolemia 11/14/2013   TIA (transient ischemic attack) 10/02/2006    ONSET DATE: 07/24/2022 MD referral  REFERRING DIAG: R29.898 (ICD-10-CM) - Muscular deconditioning   THERAPY DIAG:  Muscle weakness (generalized)  Other abnormalities of gait and mobility  Unsteadiness on feet  Rationale for Evaluation and Treatment: Rehabilitation  SUBJECTIVE:  SUBJECTIVE STATEMENT: Been sick all day (nauseous).   Pt accompanied by: self  PERTINENT HISTORY: hx COVID 08/2020 followed by post-viral asthma, hx CVA, hx melanoma, HTN, CKD, anxiety, depression  PAIN:  Are you having pain? No   PRECAUTIONS: Fall and Other: asthma  WEIGHT BEARING RESTRICTIONS: No  FALLS: Has patient fallen in last 6 months? Yes. Number of falls 2-3  LIVING ENVIRONMENT: Lives with: lives alone Lives in: Other 1st floor condo Stairs: No Has following equipment at home: Single point cane  PLOF: Independent  Does have a lady that cleans periodically Prior to Covid 2022-was very active  PATIENT GOALS: Pt's goals for therapy are to get back to normal and be able to do things for longer periods.  OBJECTIVE:     TODAY'S TREATMENT: 10/28/22 Activity Comments  Vitals  96%, 68bpm, 126/84 mmHg   Nustep L4 x 6 min UEs/LEs  At 3 min: 96% spO2, 61 bpm Maintaining ~ 40 SPM  gait training with cane including figure 8 turns, stepping over obstacles, stepping up/down airex pad Dlow moving, inconsistent SPC sequencing; cueing for safe placement of cane when stepping over/onto obstacles  97% spO2, 69 bpm; 125/72 mmHg   step  ups holding medball   toe tap on 1st, 2nd step, down 2x5 each LE Required 1 UE support when tapping with L LE; increased time required to complete this activity d/t slow pace  127/81 mmHg, 68 bpm     HOME EXERCISE PROGRAM Last updated: 09/22/22  Access Code: [email or web address] URL: [email or web address] Date: 09/22/2022 Prepared by: [facility] - Outpatient  Rehab - [facility]  Exercises - Seated Long Arc Quad  - 1 x daily - 7 x weekly - 3 sets - 10 reps - Seated March  - 1 x daily - 7 x weekly - 3 sets - 10 reps - Seated Ankle Dorsiflexion AROM  - 1 x daily - 7 x weekly - 3 sets - 10 reps - Standing Hip Abduction with Counter Support  - 1 x daily - 5 x weekly - 2-3 sets - 10 reps - Standing Hip Extension with Counter Support  - 1 x daily - 5 x weekly - 2-3 sets - 10 reps - Squat with Chair Touch  - 1 x daily - 5 x weekly - 2 sets - 10 reps    Below measures were taken at time of initial evaluation unless otherwise specified:   DIAGNOSTIC FINDINGS: MRI findings  Moderate to severe perisylvian and moderate mesial parietal atrophy; progressed since 2017. - Moderate periventricular and subcortical chronic small vessel ischemic disease.  - Stable left inferior frontal encephalomalacia and gliosis. - No acute findings.  COGNITION: Overall cognitive status: History of cognitive impairments - at baseline   SENSATION: Light touch: WFL  COORDINATION: WFL  VITALS: 96% O2, 72 bpm HR, BP 115/75  POSTURE: rounded shoulders and forward head  LOWER EXTREMITY ROM:   WFL A/ROM BLES   LOWER EXTREMITY MMT:    MMT Right Eval Left Eval  Hip flexion 4 4  Hip extension    Hip abduction 4 4  Hip adduction 4 4  Hip internal rotation    Hip external rotation    Knee flexion 4 4  Knee extension 4 4  Ankle dorsiflexion 4 4  Ankle plantarflexion    Ankle inversion    Ankle eversion    (Blank rows = not tested)    TRANSFERS: Assistive device utilized: None  Sit to  stand: Modified independence Stand to  sit: Modified independence  GAIT: Gait pattern: step through pattern and wide BOS Distance walked: 200 ft Assistive device utilized: Single point cane Level of assistance: SBA Comments: Occasional slowed pace to turn and sequence cane  FUNCTIONAL TESTS:  5 times sit to stand: 19.62 sec arms crossed at chest Timed up and go (TUG): 32.37 sec with cane 2 minute walk test: 150 ft 10 meter walk test: 21.5 Vitals after 2 MWT:  92%>96%, HR 75 bpm   TODAY'S TREATMENT:                                                                                                                              DATE: 08/27/2022    PATIENT EDUCATION: Education details: Initial eval results, POC, slowed pursed lip breathing Person educated: Patient Education method: Explanation Education comprehension: verbalized understanding  HOME EXERCISE PROGRAM: Not yet initiate  GOALS: Goals reviewed with patient? Yes  SHORT TERM GOALS: Target date: 09/26/2022  Pt will be independent with HEP for improved balance, strength, gait. Baseline: Goal status: MET 09/30/22  2.  Pt will improve 5x sit<>stand to less than or equal to 15 sec to demonstrate improved functional strength and transfer efficiency. Baseline: 19.62 sec; 15.17 sec 09/30/22 Goal status: MET 09/30/22  3.  Pt will improve TUG score to less than or equal to 25 sec for decreased fall risk. Baseline: 32.37 sec; 20.55 without SPC 09/30/22 Goal status:  MET 09/30/22  LONG TERM GOALS: Target date: 10/24/2022>UPDATED TARGET:  11/21/2022  Pt will be independent with HEP for improved balance, strength, gait. Baseline: initiated and pt would benefit from additional balance exercises as part of HEP Goal status: IN PROGRESS, 10/22/2022  2.  Pt will improve 5x sit<>stand to less than or equal to 13 sec to demonstrate improved functional strength and transfer efficiency. Baseline: 19.62 sec; 15.17 sec 09/30/22>18 sec  10/22/2022 Goal status: IN PROGRESS 09/30/22/8; 10/22/2022  3.  Pt will improve TUG score to less than or equal to 15 sec for decreased fall risk. Baseline: 32.37 sec; 20.55 sec 09/30/22>18.22 sec 10/22/2022 Goal status: IN PROGRESS 09/30/22, 10/22/2022  4.  Pt will improve gait velocity to at least 2 ft/sec for improved gait efficiency and safety. Baseline: 1.53 ft/sec (unchanged 10/22/2022) Goal status: IN PROGRESS, 10/22/2022  5.  Pt will ambulate at least 200 ft in 2 minute walk test to improve gait efficiency and endurance for community ambulation.  Baseline: 150 ft > 200 ft in 2 minute walk, 10/22/2022 Goal status: GOAL MET  6.  Pt will ambulate at least 225 ft in 2 minute walk test to improve gait efficiency and endurance for community ambulation.  Baseline: 150 ft > 200 ft in 2 minute walk, 10/22/2022 Goal status: IN PROGRESS  ASSESSMENT:  CLINICAL IMPRESSION: Patient arrived to session with report of continued nausea and visibly moving slower than usual. Vitals WNL at start of session, thus proceeded with appointment while monitoring symptoms and vitals. Patient performed  gait training with balance challenges with cane, demonstrating inconsistent sequencing and sloe pace. Increased time required to complete tasks today d/t slowness of speed. Patient maintained WNL vitals throughout session despite continuing to report nausea. No worsening of symptoms upon end of session.  OBJECTIVE IMPAIRMENTS: Abnormal gait, decreased balance, decreased mobility, difficulty walking, and decreased strength.   ACTIVITY LIMITATIONS: standing, transfers, reach over head, and locomotion level  PARTICIPATION LIMITATIONS: meal prep, cleaning, laundry, and community activity  PERSONAL FACTORS: 3+ comorbidities: see above for PMH  are also affecting patient's functional outcome.   REHAB POTENTIAL: Good  CLINICAL DECISION MAKING: Evolving/moderate complexity  EVALUATION COMPLEXITY: Moderate  PLAN:  PT  FREQUENCY: 2x/week  PT DURATION: 4 weeks per recert 10/22/2022  PLANNED INTERVENTIONS: Therapeutic exercises, Therapeutic activity, Neuromuscular re-education, Balance training, Gait training, Patient/Family education, Self Care, and DME instructions  PLAN FOR NEXT SESSION: Continue to work on progression of lower extremity strength and balance exercises, updating HEP as appropriate; gait training with cane.  Monitor vitals   Anette Guarneri, PT, DPT 10/28/22 3:29 PM  [facility] at [facility] [address] [place], [address] [place], [place] [address] Phone # [phone] Fax # [phone]

## 2022-10-28 ENCOUNTER — Encounter: Payer: Self-pay | Admitting: Physical Therapy

## 2022-10-28 ENCOUNTER — Ambulatory Visit: Payer: Medicare Other | Admitting: Physical Therapy

## 2022-10-28 DIAGNOSIS — R2689 Other abnormalities of gait and mobility: Secondary | ICD-10-CM | POA: Diagnosis not present

## 2022-10-28 DIAGNOSIS — R2681 Unsteadiness on feet: Secondary | ICD-10-CM

## 2022-10-28 DIAGNOSIS — M6281 Muscle weakness (generalized): Secondary | ICD-10-CM

## 2022-10-29 DIAGNOSIS — H2511 Age-related nuclear cataract, right eye: Secondary | ICD-10-CM | POA: Diagnosis not present

## 2022-10-29 DIAGNOSIS — H43813 Vitreous degeneration, bilateral: Secondary | ICD-10-CM | POA: Diagnosis not present

## 2022-10-29 DIAGNOSIS — H35033 Hypertensive retinopathy, bilateral: Secondary | ICD-10-CM | POA: Diagnosis not present

## 2022-10-29 DIAGNOSIS — H35371 Puckering of macula, right eye: Secondary | ICD-10-CM | POA: Diagnosis not present

## 2022-10-29 DIAGNOSIS — H34831 Tributary (branch) retinal vein occlusion, right eye, with macular edema: Secondary | ICD-10-CM | POA: Diagnosis not present

## 2022-10-30 ENCOUNTER — Ambulatory Visit (HOSPITAL_BASED_OUTPATIENT_CLINIC_OR_DEPARTMENT_OTHER): Payer: Medicare Other | Admitting: Pulmonary Disease

## 2022-10-30 ENCOUNTER — Encounter (HOSPITAL_BASED_OUTPATIENT_CLINIC_OR_DEPARTMENT_OTHER): Payer: Self-pay | Admitting: Pulmonary Disease

## 2022-10-30 ENCOUNTER — Ambulatory Visit: Payer: Medicare Other | Admitting: Physical Therapy

## 2022-11-04 ENCOUNTER — Other Ambulatory Visit (HOSPITAL_BASED_OUTPATIENT_CLINIC_OR_DEPARTMENT_OTHER): Payer: Self-pay | Admitting: Pulmonary Disease

## 2022-11-04 NOTE — Progress Notes (Deleted)
[facility] [address] [place]. [place] [address] [phone]       OFFICE FOLLOW UP NOTE  Becky. Becky [name] Date of Birth:  [birth year] Medical Record Number:  [id number]   Referring MD: Mechele Collin  Reason for Referral: Daily headaches   No chief complaint on file.     HPI:   Consult visit 10/01/2015 Dr. Pearlean Brownie : Becky [name] is a 72 year lady with long-standing history of anxiety depression who for the last 1 year or so has had progressive memory and cognitive difficulties as well as increased frequency of falls and balance problems. She is accompanied today by a cousin [name]. Becky [name] admits she did have some mild cognitive memory problems even prior to a year but since she got ECT in June 2016 this seems to have been more pronounced. She got only 4 treatments every other day. 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 She gives several examples like going to plan to change direct drops on her checking account but once she reached that she was unable to tell the bank arrest why she had come. On occasion while driving home from a friend's house she could not remember the way home and got lost. 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 She denies any headaches, extremity weakness. She is also had increased frequency of falls and states that she falls without any warning. She does not feel like she didn't pass out or have any syncopal symptoms. She falls all of a sudden. She is able to stop herself sometimes but has bruised herself. She has never lost consciousness. She denies any tingling numbness pain or burning in her feet. She denies walking like a drunk. She has no history of significant head injury with loss of consciousness. She has no family history of dementia. She lives alone she is  now getting some help with finances but needs to keep a calendar calendar to write things down so she does not forget. She has a remote history of tiny (lacunar infarct in  September 2008. MRA of the brain showed only mild atherosclerotic changes She presented at that time the 30 minute episode of right-sided facial numbness and slurred speech. I had seen her in the hospital at that time but she was lost to follow-up. She states she's had no further recurrent stroke or TIA symptoms.   Update 01/10/2016 Dr. Pearlean Brownie: She returns for follow-up after last visit 3 months ago. She is accompanied by her friend [name]. Becky [name] states her short-term memory and cognitive difficulties appeared unchanged. She needs constant reminders and needs to write things down. She still independent demented her own affairs. Becky [name] feels that she is feeling a little better after she has cut down some of her medications for anxiety and sleep. She is more alert and more interactive with her surroundings. She had MRI scan of the brain done on 10/2115 which have personally reviewed showed old left frontal infarct with encephalomalacia as well as mild changes of small vessel disease. No acute abnormality was noted. MRI scan cervical spine showed mild degenerative changes mainly at C6-7 but no definite compression is severe spinal stenosis. Lab work for reversible causes of memory loss and 10/01/15 were all normal including B12, TSH, RPR and homocysteine. EEG done on 8/9 for  17 was also normal. Becky [name] has been taking fish oil but has not been participating in cognitively challenging activities. Becky [name] does feel that she may have Alzheimer's.   Update 01/07/2017 Dr. Pearlean Brownie: She returns for follow-up after last visit 72 year ago.. She states she is doing well she has had no new neurological issues.  She continues to have mild short-term memory difficulties which are unchanged.  She has learned to use a calendar and does keep herself busy  with doing card games.  She is still fully functional and independent managing her own affairs.  She states her blood pressure is quite good today it is 1 2 5/70.  She remains on aspirin which is tolerating well without significant bruising or bleeding.  She is also on Crestor which is tolerating well without muscle aches and pains.  If she denies any new neurological complaints.  She has had no new major medical problems since her last visit.   Update 06/29/2019 JM: Becky [name] returns for follow-up regarding mild cognitive impairment with prior stroke history.  Initial evaluation with Dr. Pearlean Brownie in 09/2015 due to progressive memory and cognitive difficulties along with increase frequency of falls and balance problems.  Extensive imaging and lab work-up unremarkable for any reversible causes.  Memory has been stable since prior visit 1 year ago with Dr. Pearlean Brownie without worsening.  No reoccurring gait or balance concerns.  She denies recurrent stroke or TIA symptoms since 2008.  Continues on aspirin for secondary stroke prevention.  Crestor currently on hold due to elevated liver enzymes currently being followed by [facility] gastroenterology and PCP who plans on repeat lab work around June/July.  Blood pressure today 137/85.  No concerns at this time.   Update 04/28/2018 Dr. Pearlean Brownie : She returns for follow-up after last visit in November 72 2018.  She states she is doing well.  Her mild short-term memory and cognitive difficulties appear unchanged.  She is started taking focus factor as well as fish oil.  She keeps herself busy with doing mentally challenging activities and does play computer brain games.  She is not had recurrent stroke or TIA symptoms since 2008.  She remains on aspirin which is tolerating well without bleeding or bruising.  She states her blood pressure is under good control.  She had lipid profile and lab work done by primary care physician few months ago which was satisfactory.  She had follow-up  carotid ultrasound done on 01/21/2018 which showed no significant extracranial stenosis.  She has no new complaints today.   Update 06/29/2019 JM: Becky [name] returns for follow-up regarding mild cognitive impairment with prior stroke history.  Initial evaluation with Dr. Pearlean Brownie in 09/2015 due to progressive memory and cognitive difficulties along with increase frequency of falls and balance problems.  Extensive imaging and lab work-up unremarkable for any reversible causes.  Memory has been stable since prior visit 1 year ago with Dr. Pearlean Brownie without worsening.  No reoccurring gait or balance concerns.  She denies recurrent stroke or TIA symptoms since 2008.  Continues on aspirin for secondary stroke prevention.  Crestor currently on hold due to elevated liver enzymes currently being followed by [facility] gastroenterology and PCP who plans on repeat lab work around June/July.  Blood pressure today 137/85.  No concerns at this time.   Update 01/29/2022 Dr. Pearlean Brownie: Becky [name] is a 72 year old pleasant Caucasian lady seen today for consultation visit for headache.  History is obtained from the Becky [name] and review of electronic medical records  and I reviewed pertinent available imaging films in PACS.  She has longstanding history of anxiety depression, chronic kidney disease, hypertension, hyperlipidemia old stroke in October 2011, TIA and hypothyroidism.  Becky [name] states that she had COVID illness in July 2022 following which she has had daily headaches as well as nausea which is unrelenting and not going away.  Describes the headache as being bifrontal constant.  He describes the quality activity vomiting.  The headache is moderate 5-6/10 in severity.  She has constant nausea.  She does not vomit.  The headache increases with physical activity and she is unable to continue with work he has to lie down.  She feels better when she sleeps.  She has been taking 2 tablets of over-the-counter headache pills on a daily basis but  to provide only short-term benefit.  She has not taken any daily headache prophylactic medications.  She has not had any brain imaging done.  She denies any visual symptoms, blurred vision, focal extremity weakness and minutes.  She does have longstanding history of mild short-term memory difficulties .  She was seen last in office in April 2021 by Shanda Bumps nurse practitioner.  He states that short-term memory and cognitive difficulties are unchanged and nonprogressive.  She has not been participating in any regular cognitively challenging activities .   Update 05/05/2022 JM: Becky [name] returns for headache follow-up.  She was started on topiramate 25 mg twice daily at prior visit as well as recommended completion of brain MRI. Reports improvement of headaches since prior visit, only occurring on occasion currently, tolerating topiramate well. Has limited OTC use. Recently started saline eye drops which she believes has also helped further improve headaches. MR brian not yet completed.    Update 11/05/2022 JM: Becky [name] returns for follow-up visit.  Reports headaches ***, remains on topiramate 25 mg twice daily, tolerating without side effects.    MR brain 07/2022 showed moderate to severe perisylvian and moderate mesial parietal atrophy, progressed since 2017, moderate chronic small vessel ischemic disease, stable left inferior frontal encephalomalacia and gliosis.          ROS:   14 system review of systems is positive for those listed in HPI and all other systems negative  PMH:  Past Medical History:  Diagnosis Date   Chronic kidney disease    CVA (cerebral vascular accident) (HCC) 11/14/2013   Depression    followed by Dr Arletha Pili frequently 10/01/2015   Genital herpes    History of melanoma    followed by derm Dr Emily Filbert   Hyperlipidemia    Hypertension    MCI (mild cognitive impairment) 10/01/2015   Pure hypercholesterolemia 11/14/2013   Stroke [facility])    Old stroke seen on MRI 12/2009,  carotid doppler w minimal plaque, MRI off the head otherwise neg for acute disease.   Thyroid disease    hypothyroid. Followed by Dr Evelene Croon   TIA (transient ischemic attack) 8/08   followed by neurologist Dr Pearlean Brownie    Social History:  Social History   Socioeconomic History   Marital status: Single    Spouse name: Not on file   Number of children: Not on file   Years of education: Not on file   Highest education level: Not on file  Occupational History   Not on file  Tobacco Use   Smoking status: Never   Smokeless tobacco: Never  Vaping Use   Vaping status: Never Used  Substance and Sexual Activity   Alcohol use: No  Drug use: No   Sexual activity: Never  Other Topics Concern   Not on file  Social History Narrative   Not on file   Social Determinants of Health   Financial Resource Strain: Not on file  Food Insecurity: Not on file  Transportation Needs: Not on file  Physical Activity: Not on file  Stress: Not on file  Social Connections: Not on file  Intimate Partner Violence: Not on file    Medications:   Current Outpatient Medications on File Prior to Visit  Medication Sig Dispense Refill   albuterol (ACCUNEB) 0.63 MG/3ML nebulizer solution Take 3 mLs (0.63 mg total) by nebulization every 6 (six) hours as needed for wheezing. 120 mL 12   albuterol (PROVENTIL) (2.5 MG/3ML) 0.083% nebulizer solution take THREE mls by NEBULIZER EVERY 6 HOURS AS NEEDED FOR SHORTNESS OF BREATH OR wheezing 90 mL 3   albuterol (VENTOLIN HFA) 108 (90 Base) MCG/ACT inhaler Inhale 1 puff into the lungs every 4 (four) hours as needed for wheezing.     ALPRAZolam (XANAX) 0.25 MG tablet Take 0.25 mg by mouth 3 (three) times daily as needed for anxiety or sleep.  1   amLODipine (NORVASC) 5 MG tablet Take 5 mg by mouth daily.      ARIPiprazole (ABILIFY) 5 MG tablet Take 5 mg by mouth daily.     aspirin EC 325 MG tablet Take 325 mg by mouth 2 (two) times daily.      desvenlafaxine (PRISTIQ) 100 MG  24 hr tablet Take 100 mg by mouth daily.     desvenlafaxine (PRISTIQ) 25 MG 24 hr tablet Take 25 mg by mouth daily. Take with 100mg  tab     Dexmethylphenidate HCl 40 MG CP24 Take 40 mg daily by mouth.     fluticasone-salmeterol (ADVAIR DISKUS) 500-50 MCG/ACT AEPB Inhale 1 puff into the lungs in the morning and at bedtime. 60 each 11   hydrochlorothiazide (HYDRODIURIL) 25 MG tablet Take 25 mg by mouth every morning.     levothyroxine (SYNTHROID, LEVOTHROID) 25 MCG tablet Take 25 mcg by mouth daily before breakfast.     nadolol (CORGARD) 20 MG tablet Take 1 tablet (20 mg total) by mouth daily. Becky [name] needs appt for # 90 supply or future refills. Please call office to schedule appt. 30 tablet 0   omeprazole (PRILOSEC) 40 MG capsule Take 1 capsule (40 mg total) by mouth every morning. 30 capsule 3   pantoprazole (PROTONIX) 40 MG tablet Take 40 mg by mouth 2 (two) times daily.     potassium chloride (KLOR-CON M) 10 MEQ tablet Take 2 tablets (20 mEq total) by mouth daily. 30 tablet 2   pramipexole (MIRAPEX) 0.125 MG tablet Take 0.125 mg by mouth at bedtime.  11   pravastatin (PRAVACHOL) 40 MG tablet Take 40 mg by mouth daily.     telmisartan (MICARDIS) 80 MG tablet Take 80 mg by mouth daily.     topiramate (TOPAMAX) 25 MG tablet Take 1 tablet (25 mg total) by mouth 2 (two) times daily. 120 tablet 3   No current facility-administered medications on file prior to visit.    Allergies:   Allergies  Allergen Reactions   Penicillins Anaphylaxis   Iodinated Contrast Media Other (See Comments)   Iohexol Other (See Comments)     Desc: iodine    Nsaids Other (See Comments)    CKD     Physical Exam There were no vitals filed for this visit.  There is no height or weight  on file to calculate BMI.  General:Pleasant elderly Caucasian lady seated, in no evident distress Head: head normocephalic and atraumatic.   Neck: supple with no carotid or supraclavicular bruits Cardiovascular: regular rate  and rhythm, no murmurs Musculoskeletal: no deformity Skin:  no rash/petichiae Vascular:  Normal pulses all extremities  Neurologic Exam Mental Status: Awake and fully alert. Oriented to place and time. Recent memory mildly impaired and remote memory intact. Attention span, concentration and fund of knowledge appropriate. Mood and affect appropriate.  Cranial Nerves: Pupils equal, briskly reactive to light. Extraocular movements full without nystagmus but dysmetric saccades when looking to the left. Visual fields full to confrontation. Hearing intact. Facial sensation intact. Face, tongue, palate moves normally and symmetrically.  Motor: Normal bulk and tone. Normal strength in all tested extremity muscles. Sensory.: intact to touch , pinprick , position and vibratory sensation.  Coordination: Rapid alternating movements normal in all extremities. Finger-to-nose and heel-to-shin performed accurately bilaterally. Gait and Station: Arises from chair without difficulty. Stance is normal. Gait demonstrates normal stride length and balance . Able to heel, toe and tandem walk with moderate t difficulty.  Reflexes: 1+ and symmetric. Toes downgoing.         ASSESSMENT/PLAN: 72 year old Caucasian lady with new onset daily persistent headache following COVID illness in July 2022 likely muscle tension headache with analgesic rebound. Longstanding history of mild cognitive impairment which appears stable. MRI brain 07/2022 atrophy, progressed since 2017, and chronic small vessel disease, no acute findings.     Chronic daily headache Continue Topamax 25 mg twice daily  Limit use of OTC medication to prevent analgesic rebound Advised to contact GI to complete MRI brain previously ordered by Dr. Pearlean Brownie  Of note, blood pressure elevated today, asymptomatic.  Advised to monitor at home and if remains elevated to contact PCP for further management/recommendations.    Follow-up in 6 months or call earlier  if needed   I spent 22 minutes of face-to-face and non-face-to-face time with Becky [name].  This included previsit chart review, lab review, study review, order entry, electronic health record documentation, Becky [name] education and discussion regarding above diagnoses and treatment plan and answered all other questions to Becky [name]'s satisfaction  Ihor Austin, [facility]  [facility] [address] [place], [place] [address]  Phone [phone] Fax [phone] Note: This document was prepared with digital dictation and possible smart phrase technology. Any transcriptional errors that result from this process are unintentional.

## 2022-11-05 ENCOUNTER — Ambulatory Visit: Payer: Medicare Other | Admitting: Adult Health

## 2022-11-10 NOTE — Therapy (Signed)
OUTPATIENT PHYSICAL THERAPY NEURO TREATMENT   Patient Name: Becky [name] MRN: [id number] DOB:16-Jan-1951, 72 y.o., female Today's Date: 11/10/2022   PCP: Daisy Floro, MD REFERRING PROVIDER: Luciano Cutter, MD      END OF SESSION:            Past Medical History:  Diagnosis Date   Chronic kidney disease    CVA (cerebral vascular accident) (HCC) 11/14/2013   Depression    followed by Dr Arletha Pili frequently 10/01/2015   Genital herpes    History of melanoma    followed by derm Dr Emily Filbert   Hyperlipidemia    Hypertension    MCI (mild cognitive impairment) 10/01/2015   Pure hypercholesterolemia 11/14/2013   Stroke [facility])    Old stroke seen on MRI 12/2009, carotid doppler w minimal plaque, MRI off the head otherwise neg for acute disease.   Thyroid disease    hypothyroid. Followed by Dr Evelene Croon   TIA (transient ischemic attack) 8/08   followed by neurologist Dr Pearlean Brownie   Past Surgical History:  Procedure Laterality Date   ABDOMINAL HYSTERECTOMY     APPENDECTOMY     CHOLECYSTECTOMY     MELANOMA EXCISION     x2   Patient Active Problem List   Diagnosis Date Noted   Chronic fatigue 09/11/2021   Shortness of breath at rest 08/17/2021   Moderate persistent asthma 05/06/2021   History of COVID-19 03/19/2021   COVID-19 long hauler manifesting chronic dyspnea 03/19/2021   Thyroid disease    Stroke [facility])    Hypertension    Hyperlipidemia    History of melanoma    Genital herpes    Depression    Chronic kidney disease    MCI (mild cognitive impairment) 10/01/2015   Falls frequently 10/01/2015   Essential hypertension, benign 11/14/2013   CVA (cerebral vascular accident) (HCC) 11/14/2013   Pure hypercholesterolemia 11/14/2013   TIA (transient ischemic attack) 10/02/2006    ONSET DATE: 07/24/2022 MD referral  REFERRING DIAG: R29.898 (ICD-10-CM) - Muscular deconditioning   THERAPY DIAG:  No diagnosis found.  Rationale for Evaluation and  Treatment: Rehabilitation  SUBJECTIVE:                                                                                                                                                                                             SUBJECTIVE STATEMENT: Been sick all day (nauseous).   Pt accompanied by: self  PERTINENT HISTORY: hx COVID 08/2020 followed by post-viral asthma, hx CVA, hx melanoma, HTN, CKD, anxiety, depression  PAIN:  Are you having pain? No   PRECAUTIONS: Fall  and Other: asthma  WEIGHT BEARING RESTRICTIONS: No  FALLS: Has patient fallen in last 6 months? Yes. Number of falls 2-3  LIVING ENVIRONMENT: Lives with: lives alone Lives in: Other 1st floor condo Stairs: No Has following equipment at home: Single point cane  PLOF: Independent  Does have a lady that cleans periodically Prior to Covid 2022-was very active  PATIENT GOALS: Pt's goals for therapy are to get back to normal and be able to do things for longer periods.  OBJECTIVE:     TODAY'S TREATMENT: 11/11/22 Activity Comments                         HOME EXERCISE PROGRAM Last updated: 09/22/22  Access Code: [email or web address] URL: [email or web address] Date: 09/22/2022 Prepared by: [facility] - Outpatient  Rehab - [facility]  Exercises - Seated Long Arc Quad  - 1 x daily - 7 x weekly - 3 sets - 10 reps - Seated March  - 1 x daily - 7 x weekly - 3 sets - 10 reps - Seated Ankle Dorsiflexion AROM  - 1 x daily - 7 x weekly - 3 sets - 10 reps - Standing Hip Abduction with Counter Support  - 1 x daily - 5 x weekly - 2-3 sets - 10 reps - Standing Hip Extension with Counter Support  - 1 x daily - 5 x weekly - 2-3 sets - 10 reps - Squat with Chair Touch  - 1 x daily - 5 x weekly - 2 sets - 10 reps    Below measures were taken at time of initial evaluation unless otherwise specified:   DIAGNOSTIC FINDINGS: MRI findings  Moderate to severe perisylvian and moderate mesial parietal  atrophy; progressed since 2017. - Moderate periventricular and subcortical chronic small vessel ischemic disease.  - Stable left inferior frontal encephalomalacia and gliosis. - No acute findings.  COGNITION: Overall cognitive status: History of cognitive impairments - at baseline   SENSATION: Light touch: WFL  COORDINATION: WFL  VITALS: 96% O2, 72 bpm HR, BP 115/75  POSTURE: rounded shoulders and forward head  LOWER EXTREMITY ROM:   WFL A/ROM BLES   LOWER EXTREMITY MMT:    MMT Right Eval Left Eval  Hip flexion 4 4  Hip extension    Hip abduction 4 4  Hip adduction 4 4  Hip internal rotation    Hip external rotation    Knee flexion 4 4  Knee extension 4 4  Ankle dorsiflexion 4 4  Ankle plantarflexion    Ankle inversion    Ankle eversion    (Blank rows = not tested)    TRANSFERS: Assistive device utilized: None  Sit to stand: Modified independence Stand to sit: Modified independence  GAIT: Gait pattern: step through pattern and wide BOS Distance walked: 200 ft Assistive device utilized: Single point cane Level of assistance: SBA Comments: Occasional slowed pace to turn and sequence cane  FUNCTIONAL TESTS:  5 times sit to stand: 19.62 sec arms crossed at chest Timed up and go (TUG): 32.37 sec with cane 2 minute walk test: 150 ft 10 meter walk test: 21.5 Vitals after 2 MWT:  92%>96%, HR 75 bpm   TODAY'S TREATMENT:  DATE: 08/27/2022    PATIENT EDUCATION: Education details: Initial eval results, POC, slowed pursed lip breathing Person educated: Patient Education method: Explanation Education comprehension: verbalized understanding  HOME EXERCISE PROGRAM: Not yet initiate  GOALS: Goals reviewed with patient? Yes  SHORT TERM GOALS: Target date: 09/26/2022  Pt will be independent with HEP for improved balance, strength,  gait. Baseline: Goal status: MET 09/30/22  2.  Pt will improve 5x sit<>stand to less than or equal to 15 sec to demonstrate improved functional strength and transfer efficiency. Baseline: 19.62 sec; 15.17 sec 09/30/22 Goal status: MET 09/30/22  3.  Pt will improve TUG score to less than or equal to 25 sec for decreased fall risk. Baseline: 32.37 sec; 20.55 without SPC 09/30/22 Goal status:  MET 09/30/22  LONG TERM GOALS: Target date: 10/24/2022>UPDATED TARGET:  11/21/2022  Pt will be independent with HEP for improved balance, strength, gait. Baseline: initiated and pt would benefit from additional balance exercises as part of HEP Goal status: IN PROGRESS, 10/22/2022  2.  Pt will improve 5x sit<>stand to less than or equal to 13 sec to demonstrate improved functional strength and transfer efficiency. Baseline: 19.62 sec; 15.17 sec 09/30/22>18 sec 10/22/2022 Goal status: IN PROGRESS 09/30/22/8; 10/22/2022  3.  Pt will improve TUG score to less than or equal to 15 sec for decreased fall risk. Baseline: 32.37 sec; 20.55 sec 09/30/22>18.22 sec 10/22/2022 Goal status: IN PROGRESS 09/30/22, 10/22/2022  4.  Pt will improve gait velocity to at least 2 ft/sec for improved gait efficiency and safety. Baseline: 1.53 ft/sec (unchanged 10/22/2022) Goal status: IN PROGRESS, 10/22/2022  5.  Pt will ambulate at least 200 ft in 2 minute walk test to improve gait efficiency and endurance for community ambulation.  Baseline: 150 ft > 200 ft in 2 minute walk, 10/22/2022 Goal status: GOAL MET  6.  Pt will ambulate at least 225 ft in 2 minute walk test to improve gait efficiency and endurance for community ambulation.  Baseline: 150 ft > 200 ft in 2 minute walk, 10/22/2022 Goal status: IN PROGRESS  ASSESSMENT:  CLINICAL IMPRESSION: Patient arrived to session with report of continued nausea and visibly moving slower than usual. Vitals WNL at start of session, thus proceeded with appointment while monitoring symptoms  and vitals. Patient performed gait training with balance challenges with cane, demonstrating inconsistent sequencing and sloe pace. Increased time required to complete tasks today d/t slowness of speed. Patient maintained WNL vitals throughout session despite continuing to report nausea. No worsening of symptoms upon end of session.  OBJECTIVE IMPAIRMENTS: Abnormal gait, decreased balance, decreased mobility, difficulty walking, and decreased strength.   ACTIVITY LIMITATIONS: standing, transfers, reach over head, and locomotion level  PARTICIPATION LIMITATIONS: meal prep, cleaning, laundry, and community activity  PERSONAL FACTORS: 3+ comorbidities: see above for PMH  are also affecting patient's functional outcome.   REHAB POTENTIAL: Good  CLINICAL DECISION MAKING: Evolving/moderate complexity  EVALUATION COMPLEXITY: Moderate  PLAN:  PT FREQUENCY: 2x/week  PT DURATION: 4 weeks per recert 10/22/2022  PLANNED INTERVENTIONS: Therapeutic exercises, Therapeutic activity, Neuromuscular re-education, Balance training, Gait training, Patient/Family education, Self Care, and DME instructions  PLAN FOR NEXT SESSION: Continue to work on progression of lower extremity strength and balance exercises, updating HEP as appropriate; gait training with cane.  Monitor vitals   Anette Guarneri, PT, DPT 11/10/22 1:51 PM  [facility] Outpatient Rehab at [facility] [address] [place], [address] [place], [place] [address] Phone # [phone] Fax # [phone]

## 2022-11-11 ENCOUNTER — Encounter: Payer: Self-pay | Admitting: Physical Therapy

## 2022-11-11 ENCOUNTER — Ambulatory Visit: Payer: Medicare Other | Attending: Pulmonary Disease | Admitting: Physical Therapy

## 2022-11-11 DIAGNOSIS — R2681 Unsteadiness on feet: Secondary | ICD-10-CM | POA: Insufficient documentation

## 2022-11-11 DIAGNOSIS — M6281 Muscle weakness (generalized): Secondary | ICD-10-CM | POA: Diagnosis not present

## 2022-11-11 DIAGNOSIS — R2689 Other abnormalities of gait and mobility: Secondary | ICD-10-CM | POA: Diagnosis not present

## 2022-11-12 NOTE — Therapy (Signed)
OUTPATIENT PHYSICAL THERAPY NEURO TREATMENT   Patient Name: Becky [name] MRN: [id number] DOB:September 08, 1950, 72 y.o., female Today's Date: 11/13/2022   PCP: Daisy Floro, MD REFERRING PROVIDER: Luciano Cutter, MD      END OF SESSION:  PT End of Session - 11/13/22 1516     Visit Number 11    Number of Visits 16    Date for PT Re-Evaluation 11/21/22    Authorization Type [employer] Medicare    Progress Note Due on Visit 18   if needed   PT Start Time 1443    PT Stop Time 1516    PT Time Calculation (min) 33 min    Equipment Utilized During Treatment Gait belt    Activity Tolerance Other (comment)   nausea, "winded"   Behavior During Therapy WFL for tasks assessed/performed                       Past Medical History:  Diagnosis Date   Chronic kidney disease    CVA (cerebral vascular accident) (HCC) 11/14/2013   Depression    followed by Dr Arletha Pili frequently 10/01/2015   Genital herpes    History of melanoma    followed by derm Dr Emily Filbert   Hyperlipidemia    Hypertension    MCI (mild cognitive impairment) 10/01/2015   Pure hypercholesterolemia 11/14/2013   Stroke [facility])    Old stroke seen on MRI 12/2009, carotid doppler w minimal plaque, MRI off the head otherwise neg for acute disease.   Thyroid disease    hypothyroid. Followed by Dr Evelene Croon   TIA (transient ischemic attack) 8/08   followed by neurologist Dr Pearlean Brownie   Past Surgical History:  Procedure Laterality Date   ABDOMINAL HYSTERECTOMY     APPENDECTOMY     CHOLECYSTECTOMY     MELANOMA EXCISION     x2   Patient Active Problem List   Diagnosis Date Noted   Chronic fatigue 09/11/2021   Shortness of breath at rest 08/17/2021   Moderate persistent asthma 05/06/2021   History of COVID-19 03/19/2021   COVID-19 long hauler manifesting chronic dyspnea 03/19/2021   Thyroid disease    Stroke [facility])    Hypertension    Hyperlipidemia    History of melanoma    Genital herpes    Depression     Chronic kidney disease    MCI (mild cognitive impairment) 10/01/2015   Falls frequently 10/01/2015   Essential hypertension, benign 11/14/2013   CVA (cerebral vascular accident) (HCC) 11/14/2013   Pure hypercholesterolemia 11/14/2013   TIA (transient ischemic attack) 10/02/2006    ONSET DATE: 07/24/2022 MD referral  REFERRING DIAG: R29.898 (ICD-10-CM) - Muscular deconditioning   THERAPY DIAG:  Muscle weakness (generalized)  Other abnormalities of gait and mobility  Unsteadiness on feet  Rationale for Evaluation and Treatment: Rehabilitation  SUBJECTIVE:  SUBJECTIVE STATEMENT: Not really feeling well today. Feeling nauseous.   Pt accompanied by: self  PERTINENT HISTORY: hx COVID 08/2020 followed by post-viral asthma, hx CVA, hx melanoma, HTN, CKD, anxiety, depression  PAIN:  Are you having pain? No   PRECAUTIONS: Fall and Other: asthma  WEIGHT BEARING RESTRICTIONS: No  FALLS: Has patient fallen in last 6 months? Yes. Number of falls 2-3  LIVING ENVIRONMENT: Lives with: lives alone Lives in: Other 1st floor condo Stairs: No Has following equipment at home: Single point cane  PLOF: Independent  Does have a lady that cleans periodically Prior to Covid 2022-was very active  PATIENT GOALS: Pt's goals for therapy are to get back to normal and be able to do things for longer periods.  OBJECTIVE:     TODAY'S TREATMENT: 11/13/22 Activity Comments  Vitals at start of session  96% spO2, 87 bpm, 121/79 mmHg  Nustep L4 x 6 min UEs/LEs  Allowed pt to choose warmup machine that she could tolerate best. Did not want to try TM today. Maintaining ~55SPM  standing 3 way hip in weighted pulley 10x each 5-10# Holding onto bars; cues to avoid valsalva.  Vitals re-check  97% spO2, 87bpm, 119/93 mmHg      PATIENT EDUCATION: Education details: discussion on goals assessment on 11/20/22 and possible DC; encouraged to continue moving at home even if walking in the home for a few minutes at a time if unable to tolerate formal exercise regimen d/t chronic nausea Person educated: Patient Education method: Explanation, Demonstration, Tactile cues, and Verbal cues Education comprehension: verbalized understanding and returned demonstration    HOME EXERCISE PROGRAM Last updated: 09/22/22  Access Code: [email or web address] URL: [email or web address] Date: 09/22/2022 Prepared by: [facility] - Outpatient  Rehab - [facility]  Exercises - Seated Long Arc Quad  - 1 x daily - 7 x weekly - 3 sets - 10 reps - Seated March  - 1 x daily - 7 x weekly - 3 sets - 10 reps - Seated Ankle Dorsiflexion AROM  - 1 x daily - 7 x weekly - 3 sets - 10 reps - Standing Hip Abduction with Counter Support  - 1 x daily - 5 x weekly - 2-3 sets - 10 reps - Standing Hip Extension with Counter Support  - 1 x daily - 5 x weekly - 2-3 sets - 10 reps - Squat with Chair Touch  - 1 x daily - 5 x weekly - 2 sets - 10 reps    Below measures were taken at time of initial evaluation unless otherwise specified:   DIAGNOSTIC FINDINGS: MRI findings  Moderate to severe perisylvian and moderate mesial parietal atrophy; progressed since 2017. - Moderate periventricular and subcortical chronic small vessel ischemic disease.  - Stable left inferior frontal encephalomalacia and gliosis. - No acute findings.  COGNITION: Overall cognitive status: History of cognitive impairments - at baseline   SENSATION: Light touch: WFL  COORDINATION: WFL  VITALS: 96% O2, 72 bpm HR, BP 115/75  POSTURE: rounded shoulders and forward head  LOWER EXTREMITY ROM:   WFL A/ROM BLES   LOWER EXTREMITY MMT:    MMT Right Eval Left Eval  Hip flexion 4 4  Hip extension    Hip abduction 4 4  Hip adduction 4 4  Hip internal rotation     Hip external rotation    Knee flexion 4 4  Knee extension 4 4  Ankle dorsiflexion 4 4  Ankle plantarflexion    Ankle inversion  Ankle eversion    (Blank rows = not tested)    TRANSFERS: Assistive device utilized: None  Sit to stand: Modified independence Stand to sit: Modified independence  GAIT: Gait pattern: step through pattern and wide BOS Distance walked: 200 ft Assistive device utilized: Single point cane Level of assistance: SBA Comments: Occasional slowed pace to turn and sequence cane  FUNCTIONAL TESTS:  5 times sit to stand: 19.62 sec arms crossed at chest Timed up and go (TUG): 32.37 sec with cane 2 minute walk test: 150 ft 10 meter walk test: 21.5 Vitals after 2 MWT:  92%>96%, HR 75 bpm   TODAY'S TREATMENT:                                                                                                                              DATE: 08/27/2022    PATIENT EDUCATION: Education details: Initial eval results, POC, slowed pursed lip breathing Person educated: Patient Education method: Explanation Education comprehension: verbalized understanding  HOME EXERCISE PROGRAM: Not yet initiate  GOALS: Goals reviewed with patient? Yes  SHORT TERM GOALS: Target date: 09/26/2022  Pt will be independent with HEP for improved balance, strength, gait. Baseline: Goal status: MET 09/30/22  2.  Pt will improve 5x sit<>stand to less than or equal to 15 sec to demonstrate improved functional strength and transfer efficiency. Baseline: 19.62 sec; 15.17 sec 09/30/22 Goal status: MET 09/30/22  3.  Pt will improve TUG score to less than or equal to 25 sec for decreased fall risk. Baseline: 32.37 sec; 20.55 without SPC 09/30/22 Goal status:  MET 09/30/22  LONG TERM GOALS: Target date: 10/24/2022>UPDATED TARGET:  11/21/2022  Pt will be independent with HEP for improved balance, strength, gait. Baseline: initiated and pt would benefit from additional balance exercises as  part of HEP Goal status: IN PROGRESS, 10/22/2022  2.  Pt will improve 5x sit<>stand to less than or equal to 13 sec to demonstrate improved functional strength and transfer efficiency. Baseline: 19.62 sec; 15.17 sec 09/30/22>18 sec 10/22/2022 Goal status: IN PROGRESS 09/30/22/8; 10/22/2022  3.  Pt will improve TUG score to less than or equal to 15 sec for decreased fall risk. Baseline: 32.37 sec; 20.55 sec 09/30/22>18.22 sec 10/22/2022 Goal status: IN PROGRESS 09/30/22, 10/22/2022  4.  Pt will improve gait velocity to at least 2 ft/sec for improved gait efficiency and safety. Baseline: 1.53 ft/sec (unchanged 10/22/2022) Goal status: IN PROGRESS, 10/22/2022  5.  Pt will ambulate at least 200 ft in 2 minute walk test to improve gait efficiency and endurance for community ambulation.  Baseline: 150 ft > 200 ft in 2 minute walk, 10/22/2022 Goal status: GOAL MET  6.  Pt will ambulate at least 225 ft in 2 minute walk test to improve gait efficiency and endurance for community ambulation.  Baseline: 150 ft > 200 ft in 2 minute walk, 10/22/2022 Goal status: IN PROGRESS  ASSESSMENT:  CLINICAL IMPRESSION: Patient arrived to session with report  of continued nausea; notes feeling worse than previous sessions but agreeable to participate. Vitals normal at start of session. Progressive hip strengthening required adjustments to weight for max success and form. Continues to require short sitting rest breaks d/t fatigue between exercises. Patient reported feeling "kind of winded" after strengthening- vitals still WFL. Ended session early per patient's request d/t not feeling well.  OBJECTIVE IMPAIRMENTS: Abnormal gait, decreased balance, decreased mobility, difficulty walking, and decreased strength.   ACTIVITY LIMITATIONS: standing, transfers, reach over head, and locomotion level  PARTICIPATION LIMITATIONS: meal prep, cleaning, laundry, and community activity  PERSONAL FACTORS: 3+ comorbidities: see above for  PMH  are also affecting patient's functional outcome.   REHAB POTENTIAL: Good  CLINICAL DECISION MAKING: Evolving/moderate complexity  EVALUATION COMPLEXITY: Moderate  PLAN:  PT FREQUENCY: 2x/week  PT DURATION: 4 weeks per recert 10/22/2022  PLANNED INTERVENTIONS: Therapeutic exercises, Therapeutic activity, Neuromuscular re-education, Balance training, Gait training, Patient/Family education, Self Care, and DME instructions  PLAN FOR NEXT SESSION: continue discussing POC and possible DC after goals assessment on 11/20/22; Continue to work on progression of lower extremity strength and balance exercises, updating HEP as appropriate; gait training with cane.  Monitor vitals   Anette Guarneri, PT, DPT 11/13/22 3:19 PM  [facility] Health Outpatient Rehab at [facility] [address] [place], [address] [place], [place] [address] Phone # [phone] Fax # [phone]

## 2022-11-13 ENCOUNTER — Ambulatory Visit: Payer: Medicare Other | Admitting: Physical Therapy

## 2022-11-13 ENCOUNTER — Encounter: Payer: Self-pay | Admitting: Physical Therapy

## 2022-11-13 DIAGNOSIS — R2681 Unsteadiness on feet: Secondary | ICD-10-CM | POA: Diagnosis not present

## 2022-11-13 DIAGNOSIS — M6281 Muscle weakness (generalized): Secondary | ICD-10-CM | POA: Diagnosis not present

## 2022-11-13 DIAGNOSIS — R2689 Other abnormalities of gait and mobility: Secondary | ICD-10-CM | POA: Diagnosis not present

## 2022-11-18 ENCOUNTER — Ambulatory Visit: Payer: Medicare Other | Admitting: Physical Therapy

## 2022-11-20 ENCOUNTER — Ambulatory Visit: Payer: Medicare Other | Admitting: Physical Therapy

## 2022-11-25 ENCOUNTER — Ambulatory Visit: Payer: Medicare Other

## 2022-11-25 DIAGNOSIS — R2681 Unsteadiness on feet: Secondary | ICD-10-CM | POA: Diagnosis not present

## 2022-11-25 DIAGNOSIS — R2689 Other abnormalities of gait and mobility: Secondary | ICD-10-CM

## 2022-11-25 DIAGNOSIS — M6281 Muscle weakness (generalized): Secondary | ICD-10-CM

## 2022-11-25 NOTE — Therapy (Addendum)
OUTPATIENT PHYSICAL THERAPY NEURO TREATMENT, Recertification,  and D/C Summary   Patient Name: Becky [name] MRN: [id number] DOB:Nov 30, 1950, 72 y.o., female Today's Date: 11/25/2022   PCP: Daisy Floro, MD REFERRING PROVIDER: Luciano Cutter, MD   PHYSICAL THERAPY DISCHARGE SUMMARY  Visits from Start of Care: 12  Current functional level related to goals / functional outcomes: Able to meet STG/LTG and demo improved metrics with outcome measures   Remaining deficits: DOE, fatigue   Education / Equipment: HEP   Patient agrees to discharge. Patient goals were met. Patient is being discharged due to meeting the stated rehab goals.    END OF SESSION:  PT End of Session - 11/25/22 1448     Visit Number 12    Number of Visits 16    Date for PT Re-Evaluation 11/21/22    Authorization Type [employer] Medicare    Progress Note Due on Visit 18   if needed   PT Start Time 1445    PT Stop Time 1530    PT Time Calculation (min) 45 min    Equipment Utilized During Treatment Gait belt    Activity Tolerance Other (comment)   nausea, "winded"   Behavior During Therapy WFL for tasks assessed/performed                       Past Medical History:  Diagnosis Date   Chronic kidney disease    CVA (cerebral vascular accident) (HCC) 11/14/2013   Depression    followed by Dr Evelene Croon   Falls frequently 10/01/2015   Genital herpes    History of melanoma    followed by derm Dr Emily Filbert   Hyperlipidemia    Hypertension    MCI (mild cognitive impairment) 10/01/2015   Pure hypercholesterolemia 11/14/2013   Stroke [facility])    Old stroke seen on MRI 12/2009, carotid doppler w minimal plaque, MRI off the head otherwise neg for acute disease.   Thyroid disease    hypothyroid. Followed by Dr Evelene Croon   TIA (transient ischemic attack) 8/08   followed by neurologist Dr Pearlean Brownie   Past Surgical History:  Procedure Laterality Date   ABDOMINAL HYSTERECTOMY     APPENDECTOMY      CHOLECYSTECTOMY     MELANOMA EXCISION     x2   Patient Active Problem List   Diagnosis Date Noted   Chronic fatigue 09/11/2021   Shortness of breath at rest 08/17/2021   Moderate persistent asthma 05/06/2021   History of COVID-19 03/19/2021   COVID-19 long hauler manifesting chronic dyspnea 03/19/2021   Thyroid disease    Stroke [facility])    Hypertension    Hyperlipidemia    History of melanoma    Genital herpes    Depression    Chronic kidney disease    MCI (mild cognitive impairment) 10/01/2015   Falls frequently 10/01/2015   Essential hypertension, benign 11/14/2013   CVA (cerebral vascular accident) (HCC) 11/14/2013   Pure hypercholesterolemia 11/14/2013   TIA (transient ischemic attack) 10/02/2006    ONSET DATE: 07/24/2022 MD referral  REFERRING DIAG: R29.898 (ICD-10-CM) - Muscular deconditioning   THERAPY DIAG:  Muscle weakness (generalized)  Other abnormalities of gait and mobility  Unsteadiness on feet  Rationale for Evaluation and Treatment: Rehabilitation  SUBJECTIVE:  SUBJECTIVE STATEMENT: Overall feeling improved, rates 60% improvement. Notes continued fatigue is chief complaint  Pt accompanied by: self  PERTINENT HISTORY: hx COVID 08/2020 followed by post-viral asthma, hx CVA, hx melanoma, HTN, CKD, anxiety, depression  PAIN:  Are you having pain? No   PRECAUTIONS: Fall and Other: asthma  WEIGHT BEARING RESTRICTIONS: No  FALLS: Has patient fallen in last 6 months? Yes. Number of falls 2-3  LIVING ENVIRONMENT: Lives with: lives alone Lives in: Other 1st floor condo Stairs: No Has following equipment at home: Single point cane  PLOF: Independent  Does have a lady that cleans periodically Prior to Covid 2022-was very active  PATIENT GOALS: Pt's goals for therapy  are to get back to normal and be able to do things for longer periods.  OBJECTIVE:   TODAY'S TREATMENT: 11/25/22 Activity Comments  5xSTS 13 sec  TUG test 12 sec  2 Minute Walk Test Vitals pre-test: 115/76 mmHg, 75 bpm, 95% Walk distance: 300 ft Vitals post-test: 129/76 mmHg, 85 bpm, 97%  Walking speed 2.5 ft/sec , 1.7 mph  Walking intervals 2 x 30 sec effort bouts, 2x60 sec light effort  Education on HEP progression       TODAY'S TREATMENT: 11/13/22 Activity Comments  Vitals at start of session  96% spO2, 87 bpm, 121/79 mmHg  Nustep L4 x 6 min UEs/LEs  Allowed pt to choose warmup machine that she could tolerate best. Did not want to try TM today. Maintaining ~55SPM  standing 3 way hip in weighted pulley 10x each 5-10# Holding onto bars; cues to avoid valsalva.  Vitals re-check  97% spO2, 87bpm, 119/93 mmHg     PATIENT EDUCATION: Education details: discussion on goals assessment on 11/20/22 and possible DC; encouraged to continue moving at home even if walking in the home for a few minutes at a time if unable to tolerate formal exercise regimen d/t chronic nausea Person educated: Patient Education method: Explanation, Demonstration, Tactile cues, and Verbal cues Education comprehension: verbalized understanding and returned demonstration    HOME EXERCISE PROGRAM Last updated: 09/22/22  Access Code: [email or web address] URL: [email or web address] Date: 09/22/2022 Prepared by: [facility] - Outpatient  Rehab - [facility]  Exercises - Seated Long Arc Quad  - 1 x daily - 7 x weekly - 3 sets - 10 reps - Seated March  - 1 x daily - 7 x weekly - 3 sets - 10 reps - Seated Ankle Dorsiflexion AROM  - 1 x daily - 7 x weekly - 3 sets - 10 reps - Standing Hip Abduction with Counter Support  - 1 x daily - 5 x weekly - 2-3 sets - 10 reps - Standing Hip Extension with Counter Support  - 1 x daily - 5 x weekly - 2-3 sets - 10 reps - Squat with Chair Touch  - 1 x daily - 5 x weekly - 2  sets - 10 reps    Below measures were taken at time of initial evaluation unless otherwise specified:   DIAGNOSTIC FINDINGS: MRI findings  Moderate to severe perisylvian and moderate mesial parietal atrophy; progressed since 2017. - Moderate periventricular and subcortical chronic small vessel ischemic disease.  - Stable left inferior frontal encephalomalacia and gliosis. - No acute findings.  COGNITION: Overall cognitive status: History of cognitive impairments - at baseline   SENSATION: Light touch: WFL  COORDINATION: WFL  VITALS: 96% O2, 72 bpm HR, BP 115/75  POSTURE: rounded shoulders and forward head  LOWER EXTREMITY ROM:   WFL A/ROM  BLES   LOWER EXTREMITY MMT:    MMT Right Eval Left Eval  Hip flexion 4 4  Hip extension    Hip abduction 4 4  Hip adduction 4 4  Hip internal rotation    Hip external rotation    Knee flexion 4 4  Knee extension 4 4  Ankle dorsiflexion 4 4  Ankle plantarflexion    Ankle inversion    Ankle eversion    (Blank rows = not tested)    TRANSFERS: Assistive device utilized: None  Sit to stand: Modified independence Stand to sit: Modified independence  GAIT: Gait pattern: step through pattern and wide BOS Distance walked: 200 ft Assistive device utilized: Single point cane Level of assistance: SBA Comments: Occasional slowed pace to turn and sequence cane  FUNCTIONAL TESTS:  5 times sit to stand: 19.62 sec arms crossed at chest Timed up and go (TUG): 32.37 sec with cane 2 minute walk test: 150 ft 10 meter walk test: 21.5 Vitals after 2 MWT:  92%>96%, HR 75 bpm   TODAY'S TREATMENT:                                                                                                                              DATE: 08/27/2022    PATIENT EDUCATION: Education details: Initial eval results, POC, slowed pursed lip breathing Person educated: Patient Education method: Explanation Education comprehension: verbalized  understanding  HOME EXERCISE PROGRAM: Not yet initiate  GOALS: Goals reviewed with patient? Yes  SHORT TERM GOALS: Target date: 09/26/2022  Pt will be independent with HEP for improved balance, strength, gait. Baseline: Goal status: MET 09/30/22  2.  Pt will improve 5x sit<>stand to less than or equal to 15 sec to demonstrate improved functional strength and transfer efficiency. Baseline: 19.62 sec; 15.17 sec 09/30/22 Goal status: MET 09/30/22  3.  Pt will improve TUG score to less than or equal to 25 sec for decreased fall risk. Baseline: 32.37 sec; 20.55 without SPC 09/30/22 Goal status:  MET 09/30/22  LONG TERM GOALS: Target date: 10/24/2022>UPDATED TARGET:  11/25/2022  Pt will be independent with HEP for improved balance, strength, gait. Baseline: initiated and pt would benefit from additional balance exercises as part of HEP Goal status: MET  2.  Pt will improve 5x sit<>stand to less than or equal to 13 sec to demonstrate improved functional strength and transfer efficiency. Baseline: 19.62 sec; 15.17 sec 09/30/22>18 sec 10/22/2022; (11/25/22) 13 sec Goal status: MET  3.  Pt will improve TUG score to less than or equal to 15 sec for decreased fall risk. Baseline: 32.37 sec; 20.55 sec 09/30/22>18.22 sec 10/22/2022; (11/25/22) 12 sec Goal status: MET  4.  Pt will improve gait velocity to at least 2 ft/sec for improved gait efficiency and safety. Baseline: 1.53 ft/sec (unchanged 10/22/2022); 1.7 ft/sec Goal status: IN PROGRESS, 10/22/2022; (11/25/22) NOT MET  5.  Pt will ambulate at least 200 ft in 2 minute walk test to  improve gait efficiency and endurance for community ambulation.  Baseline: 150 ft > 200 ft in 2 minute walk, 10/22/2022 Goal status: GOAL MET  6.  Pt will ambulate at least 225 ft in 2 minute walk test to improve gait efficiency and endurance for community ambulation.  Baseline: 150 ft > 200 ft in 2 minute walk, 10/22/2022; 300 ft Goal status:  MET  ASSESSMENT:  CLINICAL IMPRESSION: Patient arrives without new issue and reports 60% improvement in activity. Performance of LTG and outcome measures with global improvements in strength, balance and walking speed/distance.  Pt exibits DOE with faster paced walking but maintains O2 sats > 94%.  Pt able to teachback HEP and report back understanding of relevant progressions.  Discussed D/C to HEP and [employer] programs OBJECTIVE IMPAIRMENTS: Abnormal gait, decreased balance, decreased mobility, difficulty walking, and decreased strength.   ACTIVITY LIMITATIONS: standing, transfers, reach over head, and locomotion level  PARTICIPATION LIMITATIONS: meal prep, cleaning, laundry, and community activity  PERSONAL FACTORS: 3+ comorbidities: see above for PMH  are also affecting patient's functional outcome.   REHAB POTENTIAL: Good  CLINICAL DECISION MAKING: Evolving/moderate complexity  EVALUATION COMPLEXITY: Moderate  PLAN:  PT FREQUENCY: one time visit  PT DURATION:  1 sessions per recert 11/25/22  PLANNED INTERVENTIONS: Therapeutic exercises, Therapeutic activity, Neuromuscular re-education, Balance training, Gait training, Patient/Family education, Self Care, and DME instructions  PLAN FOR NEXT SESSION:D/C to HEP   3:43 PM, 11/25/22 M. Shary Decamp, PT, DPT Physical Therapist- [place] Office Number: [phone]

## 2022-11-26 DIAGNOSIS — H43813 Vitreous degeneration, bilateral: Secondary | ICD-10-CM | POA: Diagnosis not present

## 2022-11-26 DIAGNOSIS — H2511 Age-related nuclear cataract, right eye: Secondary | ICD-10-CM | POA: Diagnosis not present

## 2022-11-26 DIAGNOSIS — H34831 Tributary (branch) retinal vein occlusion, right eye, with macular edema: Secondary | ICD-10-CM | POA: Diagnosis not present

## 2022-11-26 DIAGNOSIS — H35371 Puckering of macula, right eye: Secondary | ICD-10-CM | POA: Diagnosis not present

## 2022-11-26 DIAGNOSIS — H35033 Hypertensive retinopathy, bilateral: Secondary | ICD-10-CM | POA: Diagnosis not present

## 2022-11-27 ENCOUNTER — Ambulatory Visit: Payer: Medicare Other | Admitting: Physical Therapy

## 2022-12-04 ENCOUNTER — Telehealth: Payer: Self-pay | Admitting: Rehabilitative and Restorative Service Providers"

## 2022-12-04 NOTE — Addendum Note (Signed)
Addended by: Dion Body on: 12/04/2022 01:37 PM   Modules accepted: Orders

## 2022-12-04 NOTE — Telephone Encounter (Signed)
The patient called our clinic to discuss her discharge. She was concerned because she had appointments scheduled beyond her last date and she felt the d/c occurred so quickly and wanted to ensure she did not do something wrong.  This was the patient's first time receiving physical therapy, so we discussed the dates of her care and the plan of care and goals that were established. I reviewed her record with her talking through her discharge goals, plan of care after discharge.   With that discussion, she felt that her questions were answered. PT encouraged her to continue to work on HEP and transition to group exercise. She can always speak with her MD if any future needs arise or call our clinic for guidance.  Kito Cuffe, PT

## 2022-12-16 IMAGING — CT CT CHEST W/O CM
2 of 3 series · 15 of 36 positions shown, 18 images · non-contrast
Comparison: Portable chest today, and abdomen and pelvis CT dated
11/10/2018. PA Lat 03/15/2021

CLINICAL DATA: 70-year-old with shortness of breath.



[Series 2: thorax · axial · 0.77mm/px · z∈[+948,+1206]mm · 12 of 153 slices shown, 15 images]
[im 12/153  mediastinal]
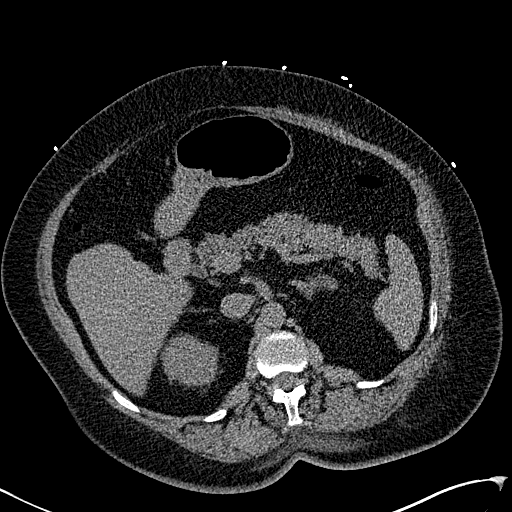
[im 12/153  lung]
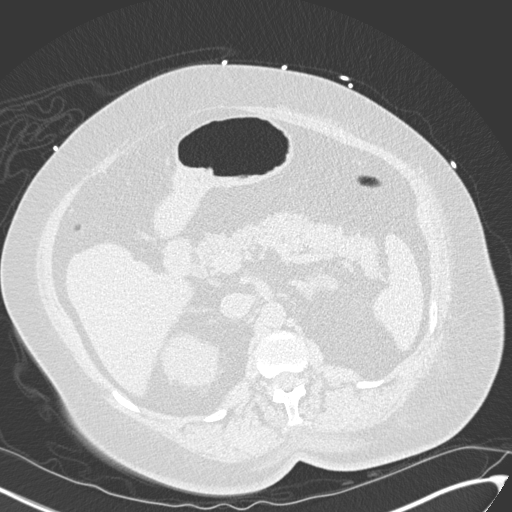
[im 23/153  lung]
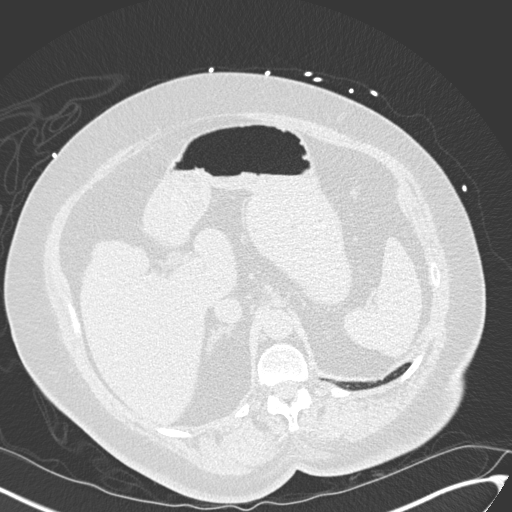
[im 34/153  lung]
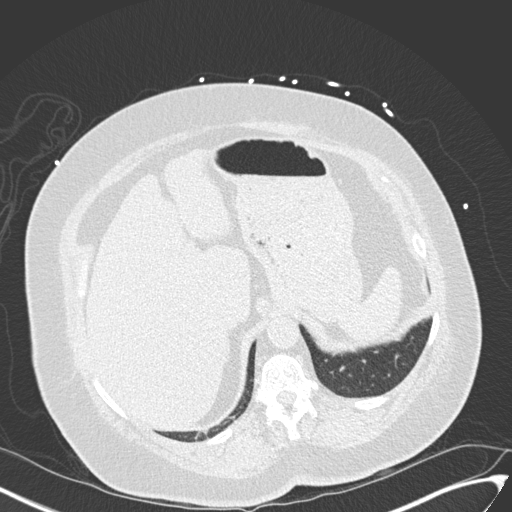
[im 46/153  lung]
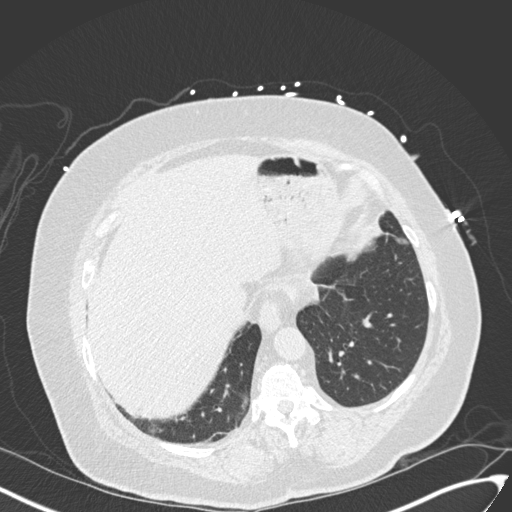
[im 57/153  mediastinal]
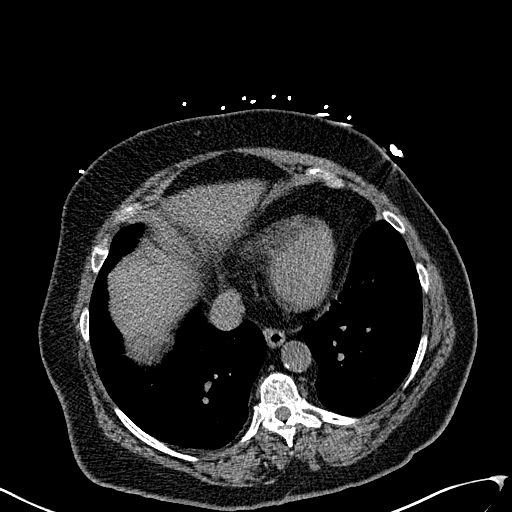
[im 57/153  lung]
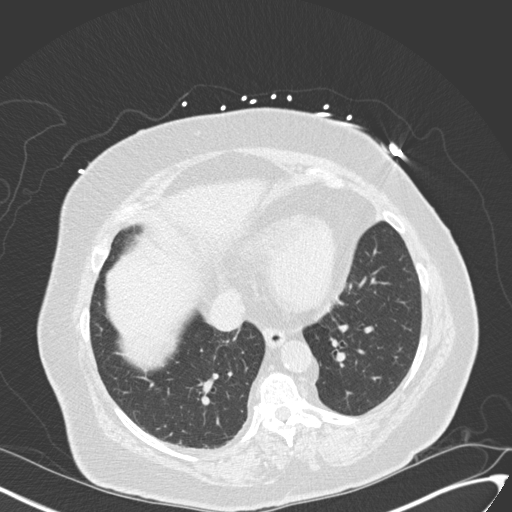
[im 68/153  lung]
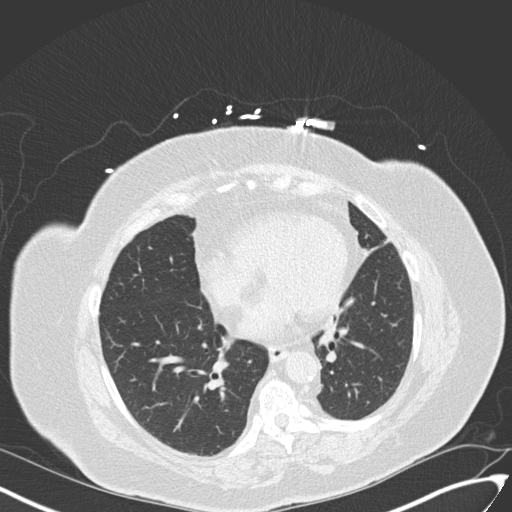
[im 85/153  lung]
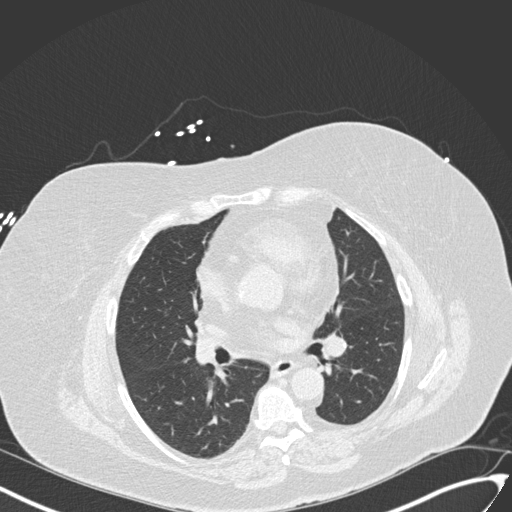
[im 96/153  lung]
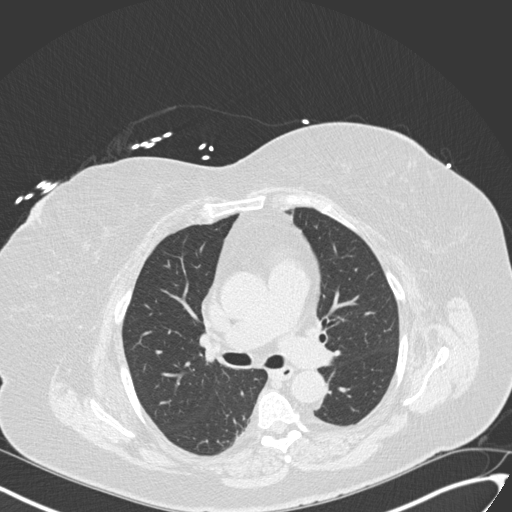
[im 107/153  mediastinal]
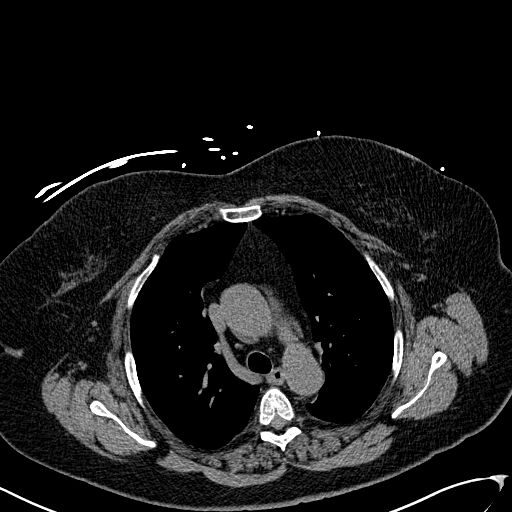
[im 107/153  lung]
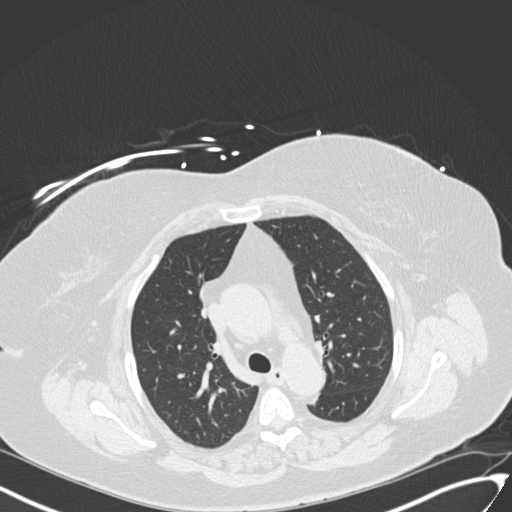
[im 119/153  lung]
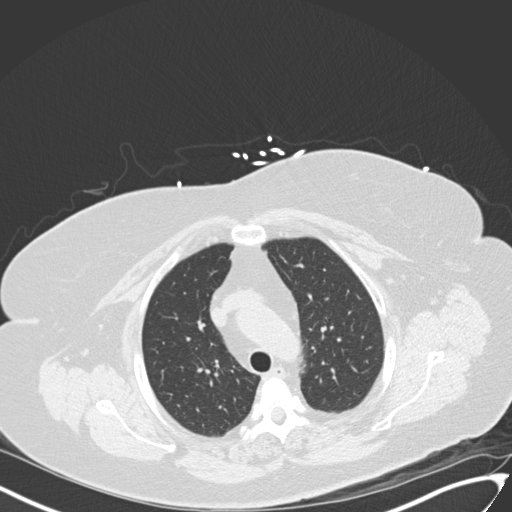
[im 130/153  lung]
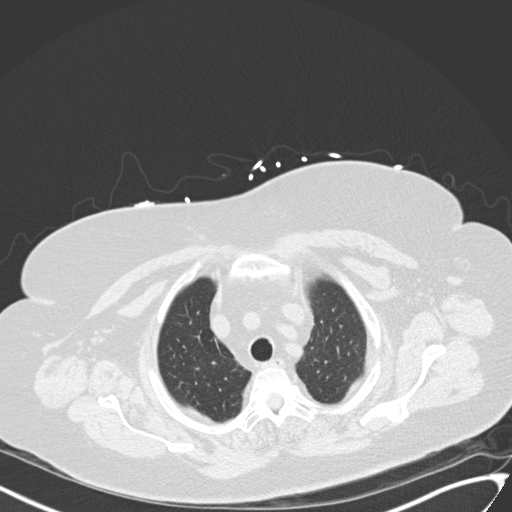
[im 141/153  lung]
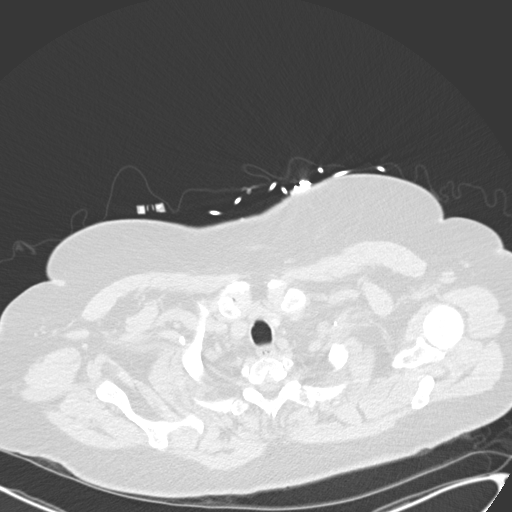

[Series 6: coronal · coronal · 0.64mm/px · 3 of 162 slices shown]
[im 33/162  lung]
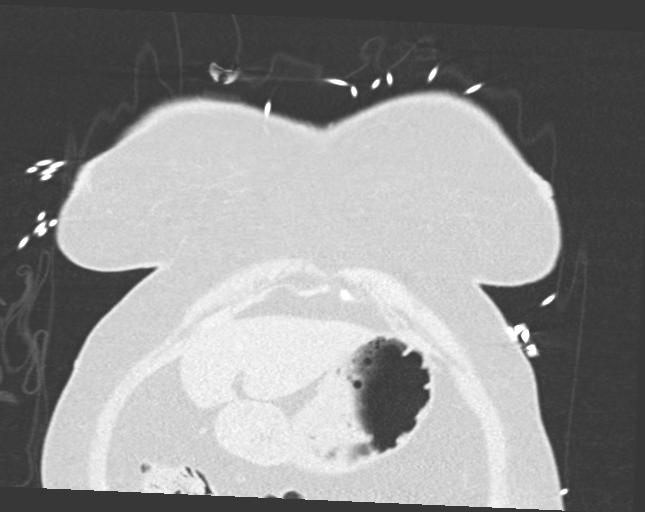
[im 65/162  lung]
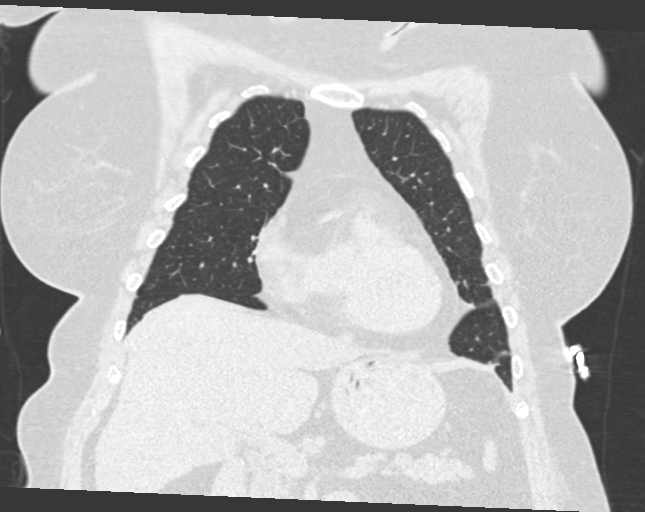
[im 97/162  lung]
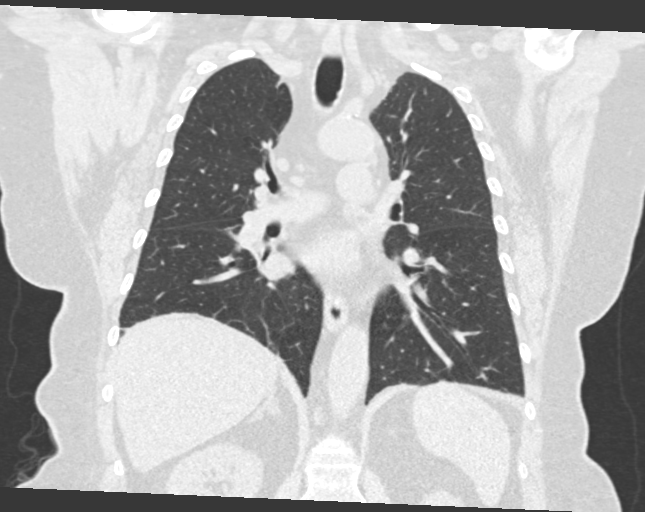

[15 of 36 positions shown; findings below may reference images not displayed]

FINDINGS: Cardiovascular: The cardiac size is normal. There are trace
calcifications left main and LAD coronary artery. No pericardial
effusion. Lipomatous hypertrophy interatrial septum is again shown.

The pulmonary arteries and veins are normal caliber. There is mild
aortic atherosclerosis with scattered calcific plaques without
aneurysm. There is normal great vessel branching.

Mediastinum/Nodes: No enlarged mediastinal or axillary lymph nodes.
Thyroid gland, trachea, and esophagus demonstrate no significant
findings. Small hiatal hernia is again noted.

Lungs/Pleura: There is scattered linear scarring or atelectasis in
the lung bases, mild chronic elevated right hemidiaphragm. Central
airways are clear. No pulmonary mass or infiltrate is seen. There is
no pleural effusion, thickening or pneumothorax.

Upper Abdomen: Old cholecystectomy. No biliary dilatation. Hepatic
steatosis. No acute noncontrast CT findings.

Musculoskeletal: Slight thoracic kyphodextroscoliosis. Mild
osteopenia. No acute or significant osseous findings. No chest wall
mass is seen.
IMPRESSION: 1. No acute noncontrast chest CT findings.
2. Lipomatous hypertrophy of the interatrial septum.
3. Aortic and coronary artery atherosclerosis.
4. Hepatic steatosis.
5. Small hiatal hernia.

## 2022-12-22 DIAGNOSIS — Z961 Presence of intraocular lens: Secondary | ICD-10-CM | POA: Diagnosis not present

## 2022-12-22 DIAGNOSIS — H2511 Age-related nuclear cataract, right eye: Secondary | ICD-10-CM | POA: Diagnosis not present

## 2023-01-01 ENCOUNTER — Telehealth: Payer: Self-pay | Admitting: Pulmonary Disease

## 2023-01-01 NOTE — Telephone Encounter (Signed)
Patient is aware of recommendations and voiced her understanding. She stated that she can not drive at night, therefore she can not go to the ED or UC.   Routing to Dr. Sherene Sires to make aware.

## 2023-01-01 NOTE — Telephone Encounter (Signed)
Can't treat this over the phone at age 72 > UC or ER asap

## 2023-01-01 NOTE — Telephone Encounter (Signed)
Patient has bad breathing episodes. She has used her inhalers and nebulizer machine but is having a hard time breathing. Patient is wondering if steroids or a prescription can be called in for her breathing.   Pharmacy [facility] on [facility]

## 2023-01-01 NOTE — Telephone Encounter (Signed)
Called and spoke to patient. She reports of increased SOB with exertion, dry cough, wheezing and weakness x2d. Denied f/c/s or additional sx.  No recent covid test.  No supplemental oxygen. She does not monitor oxygen levels. She has used albuterol BID and Advair BID and albuterol once without improvement.   Dr. Sherene Sires, please advise. Dr. Everardo All is unavailable. Thanks

## 2023-01-01 NOTE — Telephone Encounter (Signed)
Patient refused Urgent Care and Emergency room because patient cannot drive at night. Patient is scheduled in the soonest acute slot with Dr. Wynona Neat 11/7 in acute slot

## 2023-01-05 DIAGNOSIS — R0600 Dyspnea, unspecified: Secondary | ICD-10-CM | POA: Diagnosis not present

## 2023-01-05 DIAGNOSIS — U099 Post covid-19 condition, unspecified: Secondary | ICD-10-CM | POA: Diagnosis not present

## 2023-01-07 DIAGNOSIS — H35371 Puckering of macula, right eye: Secondary | ICD-10-CM | POA: Diagnosis not present

## 2023-01-07 DIAGNOSIS — H34831 Tributary (branch) retinal vein occlusion, right eye, with macular edema: Secondary | ICD-10-CM | POA: Diagnosis not present

## 2023-01-07 DIAGNOSIS — H2511 Age-related nuclear cataract, right eye: Secondary | ICD-10-CM | POA: Diagnosis not present

## 2023-01-07 DIAGNOSIS — H35033 Hypertensive retinopathy, bilateral: Secondary | ICD-10-CM | POA: Diagnosis not present

## 2023-01-07 DIAGNOSIS — H43813 Vitreous degeneration, bilateral: Secondary | ICD-10-CM | POA: Diagnosis not present

## 2023-01-08 ENCOUNTER — Ambulatory Visit: Payer: Medicare Other | Admitting: Pulmonary Disease

## 2023-01-08 ENCOUNTER — Encounter: Payer: Self-pay | Admitting: Pulmonary Disease

## 2023-01-08 VITALS — BP 120/84 | HR 74 | Ht 60.0 in | Wt 206.8 lb

## 2023-01-08 DIAGNOSIS — J4540 Moderate persistent asthma, uncomplicated: Secondary | ICD-10-CM | POA: Diagnosis not present

## 2023-01-08 DIAGNOSIS — R0602 Shortness of breath: Secondary | ICD-10-CM | POA: Diagnosis not present

## 2023-01-08 MED ORDER — AZITHROMYCIN 250 MG PO TABS: ORAL_TABLET | ORAL | 0 refills | Status: DC

## 2023-01-08 MED ORDER — PREDNISONE 20 MG PO TABS: 40.0000 mg | ORAL_TABLET | Freq: Every day | ORAL | 0 refills | Status: AC

## 2023-01-08 NOTE — Patient Instructions (Addendum)
Prescription for antibiotic and steroids sent to pharmacy for you  Follow-up appointment with Dr. Everardo All in about 6 to 8 weeks  Azithromycin for 5 days and prednisone 40 mg daily for 5 days

## 2023-01-08 NOTE — Progress Notes (Signed)
Subjective:   PATIENT ID: Becky [name] GENDER: female DOB: [birth year], MRN: [id number]   HPI Patient being seen today for an acute visit Reason for Visit:   Shortness of breath, wheezing, tightness for about a week  Usually follows up with Dr. Everardo All  History of CVA, history of melanoma, hypertension, chronic kidney disease, chronic shortness of breath, history of asthma  Denies a fever, denies any chills  Compliant with Advair  Last saw Dr. Everardo All in May 2024  Initial Consult She was diagnosed with COVID-19 in July and treated with paxlovid. She had persistent symptoms for three weeks with cough, wheezing, shortness of breath, fatigue and insomnia. At this point she has persistent shortness of breath, wheezing with exertion and nonproductive cough. Denies prior history of asthma or frequent respiratory infections. She is albuterol twice a day. She has an prescription for Flovent but only taking if albuterol is not working. Uses it 2-3 times a week. She is walking more slowly due to fatigue and balance issues. She has to stop when walking up a flight stairs. She has to take breaks while vacuuming.   05/06/21 Since our last visit she was stepped up from Flovent to low-dose Advair. Able to tolerate but no significant difference. She has not been able to be active with aerobic activity but does have a new puppy that she is chasing up and down stairs. Planning to start taking her on walks when she is leash trained. Her activity is limited by nausea which she is scheduled to see GI.   Social History: Never smoker Childhood second hand smoke exposure  Past Medical History:  Diagnosis Date   Chronic kidney disease    CVA (cerebral vascular accident) (HCC) 11/14/2013   Depression    followed by Dr Arletha Pili frequently 10/01/2015   Genital herpes    History of melanoma    followed by derm Dr Emily Filbert   Hyperlipidemia    Hypertension    MCI (mild cognitive impairment)  10/01/2015   Pure hypercholesterolemia 11/14/2013   Stroke [facility])    Old stroke seen on MRI 12/2009, carotid doppler w minimal plaque, MRI off the head otherwise neg for acute disease.   Thyroid disease    hypothyroid. Followed by Dr Evelene Croon   TIA (transient ischemic attack) 8/08   followed by neurologist Dr Pearlean Brownie     Family History  Problem Relation Age of Onset   Breast cancer Mother    Hyperlipidemia Father    Hypertension Father    Heart attack Father    Hypertension Sister    Hyperlipidemia Sister    Dementia Maternal Aunt      Social History   Occupational History   Not on file  Tobacco Use   Smoking status: Never   Smokeless tobacco: Never  Vaping Use   Vaping status: Never Used  Substance and Sexual Activity   Alcohol use: No   Drug use: No   Sexual activity: Never    Allergies  Allergen Reactions   Penicillins Anaphylaxis   Iodinated Contrast Media Other (See Comments)   Iohexol Other (See Comments)     Desc: iodine    Nsaids Other (See Comments)    CKD      Outpatient Medications Prior to Visit  Medication Sig Dispense Refill   albuterol (ACCUNEB) 0.63 MG/3ML nebulizer solution Take 3 mLs (0.63 mg total) by nebulization every 6 (six) hours as needed for wheezing. 120 mL 12   albuterol (PROVENTIL) (  2.5 MG/3ML) 0.083% nebulizer solution USE 1 VIAL IN NEBULIZER EVERY 6 HOURS AS NEEDED FOR SHORTNESS OF BREATH AND FOR WHEEZING 225 mL 3   albuterol (VENTOLIN HFA) 108 (90 Base) MCG/ACT inhaler Inhale 1 puff into the lungs every 4 (four) hours as needed for wheezing.     ALPRAZolam (XANAX) 0.25 MG tablet Take 0.25 mg by mouth 3 (three) times daily as needed for anxiety or sleep.  1   amLODipine (NORVASC) 5 MG tablet Take 5 mg by mouth daily.      ARIPiprazole (ABILIFY) 5 MG tablet Take 5 mg by mouth daily.     aspirin EC 325 MG tablet Take 325 mg by mouth 2 (two) times daily.      desvenlafaxine (PRISTIQ) 100 MG 24 hr tablet Take 100 mg by mouth daily.      Dexmethylphenidate HCl 40 MG CP24 Take 40 mg daily by mouth.     fluticasone-salmeterol (ADVAIR DISKUS) 500-50 MCG/ACT AEPB Inhale 1 puff into the lungs in the morning and at bedtime. 60 each 11   hydrochlorothiazide (HYDRODIURIL) 25 MG tablet Take 25 mg by mouth every morning.     levothyroxine (SYNTHROID, LEVOTHROID) 25 MCG tablet Take 25 mcg by mouth daily before breakfast.     nadolol (CORGARD) 20 MG tablet Take 1 tablet (20 mg total) by mouth daily. Patient needs appt for # 90 supply or future refills. Please call office to schedule appt. 30 tablet 0   omeprazole (PRILOSEC) 40 MG capsule Take 1 capsule (40 mg total) by mouth every morning. 30 capsule 3   pantoprazole (PROTONIX) 40 MG tablet Take 40 mg by mouth 2 (two) times daily.     potassium chloride (KLOR-CON M) 10 MEQ tablet Take 2 tablets (20 mEq total) by mouth daily. 30 tablet 2   pramipexole (MIRAPEX) 0.125 MG tablet Take 0.125 mg by mouth at bedtime.  11   pravastatin (PRAVACHOL) 40 MG tablet Take 40 mg by mouth daily.     predniSONE (DELTASONE) 10 MG tablet Take 10 mg by mouth 2 (two) times daily.     telmisartan (MICARDIS) 80 MG tablet Take 80 mg by mouth daily.     topiramate (TOPAMAX) 25 MG tablet Take 1 tablet (25 mg total) by mouth 2 (two) times daily. 120 tablet 3   desvenlafaxine (PRISTIQ) 25 MG 24 hr tablet Take 25 mg by mouth daily. Take with 100mg  tab     No facility-administered medications prior to visit.    Review of Systems  Constitutional:  Positive for malaise/fatigue. Negative for chills, diaphoresis, fever and weight loss.  HENT:  Negative for congestion.   Respiratory:  Positive for shortness of breath. Negative for cough, hemoptysis, sputum production and wheezing.   Cardiovascular:  Negative for chest pain, palpitations and leg swelling.  Gastrointestinal:  Positive for nausea.  Neurological:  Positive for dizziness.     Objective:   Vitals:   01/08/23 1310  BP: 120/84  Pulse: 74  SpO2: 96%   Weight: 206 lb 12.8 oz (93.8 kg)  Height: 5' (1.524 m)   SpO2: 96 % O2 Device: None (Room air)  Physical Exam: General: Elderly, does not appear acutely ill HENT: Moist oral mucous Eyes: EOMI, no scleral icterus Respiratory: Clear breath lungs bilaterally cardiovascular: S1-S2 appreciated Extremities: No clubbing, no edema Neuro: Alert and oriented Psych: Normal mood, normal affect  Data Reviewed:  Imaging: CXR 03/15/21 - Lingular scarring. No infiltrate, effusion or edema  PFT: 05/06/21 FVC 1.88 (74%) FEV1 1.5 (  79%) Ratio 81  TLC 127% DLCO 89% Interpretation: No obstructive defect on spirometry however air trapping present and F-V curves suggestive of obstructive defect/small airway disease. Normal gas exchange  Labs: CBC    Component Value Date/Time   WBC 15.0 (H) 08/18/2021 0727   RBC 3.84 (L) 08/18/2021 0727   HGB 12.3 08/18/2021 0727   HGB 13.0 06/30/2013 1300   HCT 36.6 08/18/2021 0727   HCT 38.7 06/30/2013 1300   PLT 281 08/18/2021 0727   PLT 341 06/30/2013 1300   MCV 95.3 08/18/2021 0727   MCV 89 06/30/2013 1300   MCH 32.0 08/18/2021 0727   MCHC 33.6 08/18/2021 0727   RDW 13.3 08/18/2021 0727   RDW 13.4 06/30/2013 1300   LYMPHSABS 1.2 08/17/2021 1737   LYMPHSABS 2.7 06/30/2013 1300   MONOABS 0.6 08/17/2021 1737   MONOABS 1.1 (H) 06/30/2013 1300   EOSABS 0.0 08/17/2021 1737   EOSABS 0.2 06/30/2013 1300   BASOSABS 0.0 08/17/2021 1737   BASOSABS 0.1 06/30/2013 1300      Assessment & Plan:   Discussion: Okay Shortness of breath Obstructive lung disease -Acute bronchitis -Course of antibiotics and steroids called to pharmacy for  Encouraged to continue using Advair  She will follow-up with Dr. Everardo All In about 6 to 8 weeks  Chronic fatigue  Deconditioning -Graded activities as tolerated

## 2023-01-13 DIAGNOSIS — U099 Post covid-19 condition, unspecified: Secondary | ICD-10-CM | POA: Diagnosis not present

## 2023-01-13 DIAGNOSIS — K1379 Other lesions of oral mucosa: Secondary | ICD-10-CM | POA: Diagnosis not present

## 2023-01-21 ENCOUNTER — Encounter (HOSPITAL_BASED_OUTPATIENT_CLINIC_OR_DEPARTMENT_OTHER): Payer: Self-pay | Admitting: Pulmonary Disease

## 2023-01-21 ENCOUNTER — Ambulatory Visit (HOSPITAL_BASED_OUTPATIENT_CLINIC_OR_DEPARTMENT_OTHER): Payer: Medicare Other | Admitting: Pulmonary Disease

## 2023-01-21 VITALS — BP 108/62 | HR 69 | Resp 18 | Ht 60.0 in | Wt 205.0 lb

## 2023-01-21 DIAGNOSIS — R0602 Shortness of breath: Secondary | ICD-10-CM | POA: Diagnosis not present

## 2023-01-21 DIAGNOSIS — G4733 Obstructive sleep apnea (adult) (pediatric): Secondary | ICD-10-CM | POA: Diagnosis not present

## 2023-01-21 DIAGNOSIS — Z8616 Personal history of COVID-19: Secondary | ICD-10-CM | POA: Diagnosis not present

## 2023-01-21 NOTE — Progress Notes (Signed)
Subjective:    Patient ID: Becky [name], female    DOB: 11/11/1950, 72 y.o.   MRN: [id number]  HPI  72 yo woman  with hx COVID 08/2020 followed by post-viral asthma, hx CVA, hx melanoma, HTN, CKD who presents for asthma follow-up   Synopsis: 2022 -She was diagnosed with COVID-19 in July 2022. Denies prior history of asthma or frequent respiratory infections.  2023 - March and May had outpatient exacerbation and hospitalization in July. Flovent>Advair. Improved control of symptoms. 2024 - Well-controlled on high dose Advair with no exacerbations. Recent dizziness and deconditioning affecting her activity level   Chief Complaint  Patient presents with   Acute Visit    SOB-its about the same as when she saw Becky [name]. She is so weak unsure what is going on. She has it [name] the time no matter what she does    Acute OV with Becky [name] 01/08/23 -Course of antibiotics and steroids   Discussed the use of AI scribe software for clinical note transcription with the patient, who gave verbal consent to proceed.  History of Present Illness   The patient, with a history of COVID-19, presents with persistent shortness of breath for approximately two weeks. The onset was sudden, with the patient waking up with the symptom, and it has been constant since then. The dyspnea is present at rest and exacerbated by exertion. The patient also reports significant weakness, to the point of difficulty with mobility.  The patient has been on a regimen of Advair and Albuterol, both nebulizer and inhaler forms, for management of respiratory symptoms. However, a recent course of Prednisone and an antibiotic did not provide the usual relief. The patient denies significant coughing, but occasionally others have noted wheezing.  The patient also reports sleep disturbances, with some nights of good sleep and others of restlessness due to dyspnea and anxiety. The patient sleeps on two pillows with an elevated bed due  to discomfort when lying flat.  The patient also mentions a recent oral issue with swelling of the tongue and jaw, which has been improving with Protonix. The patient denies any recent colds or known triggers for the current respiratory symptoms.  The patient completed a course of therapy approximately three weeks ago, which was beneficial, but has been unable to continue with planned gym visits due to the current symptoms. The patient has a history of high blood pressure and long QT, but denies any heart trouble.       Significant tests/ events reviewed  CT Chest 08/17/21 - No parenchymal abnormalities Echo 08/2021    PFT: 05/06/21 FVC 1.88 (74%) FEV1 1.5 (79%) Ratio 81  TLC 127% DLCO 89% Interpretation: No obstructive defect on spirometry however air trapping present and F-V curves suggestive of obstructive defect/small airway disease. Normal gas exchange  Review of Systems neg for any significant sore throat, dysphagia, itching, sneezing, nasal congestion or excess/ purulent secretions, fever, chills, sweats, unintended wt loss, pleuritic or exertional cp, hempoptysis, orthopnea pnd or change in chronic leg swelling. Also denies presyncope, palpitations, heartburn, abdominal pain, nausea, vomiting, diarrhea or change in bowel or urinary habits, dysuria,hematuria, rash, arthralgias, visual complaints, headache, numbness weakness or ataxia.     Objective:   Physical Exam Gen. Pleasant, obese, in no distress ENT - no lesions, no post nasal drip Neck: No JVD, no thyromegaly, no carotid bruits Lungs: no use of accessory muscles, no dullness to percussion, decreased without rales or rhonchi  Cardiovascular: Rhythm regular, heart sounds  normal,  no murmurs or gallops, no peripheral edema Musculoskeletal: No deformities, no cyanosis or clubbing , no tremors        Assessment & Plan:   She felt dizzy after 1 lap but oxygen saturation stayed at 96% and heart rate increased slightly to 79  from the baseline 64  Assessment and Plan    Mild persistent asthma Covid -long hauler Unexplained Dyspnea Persistent shortness of breath for two weeks without clear precipitating event. No improvement with prednisone. No wheezing on exam. History of COVID-19 in 2022 with no evidence of fibrosis on previous imaging. -Check oxygen levels during ambulation. -Order D-dimer to rule out pulmonary embolism.  Oral Lesions Recent oral discomfort with swelling of tongue and jaw. Improved with pantoprazole. No evidence of thrush on exam. -Continue pantoprazole as prescribed.   OSA  - probable, split night study  Follow-up with Becky. Becky [name] in December.      If D-dimer is positive, may have to pursue VQ scan since she has allergy to contrast .  If that does not possible due to shortage then may have to consider venous duplex

## 2023-01-21 NOTE — Patient Instructions (Addendum)
X Amb saturation  X check d-dimer for blood clots   X split night sleep study   VISIT SUMMARY:  You were seen today for persistent shortness of breath that has been ongoing for two weeks. You also reported significant weakness and difficulty with mobility. Additionally, you mentioned recent oral discomfort that has been improving with medication.  YOUR PLAN:  -UNEXPLAINED DYSPNEA: Dyspnea means shortness of breath. You have been experiencing this symptom for two weeks without a clear cause. We will check your oxygen levels while you walk and order a D-dimer test to rule out a blood clot in your lungs.   INSTRUCTIONS:  Please follow up with Dr. Everardo All in December.

## 2023-01-22 LAB — D-DIMER, QUANTITATIVE: D-DIMER: 0.24 mg{FEU}/L (ref 0.00–0.49)

## 2023-01-27 DIAGNOSIS — H25811 Combined forms of age-related cataract, right eye: Secondary | ICD-10-CM | POA: Diagnosis not present

## 2023-01-27 DIAGNOSIS — H2511 Age-related nuclear cataract, right eye: Secondary | ICD-10-CM | POA: Diagnosis not present

## 2023-01-28 DIAGNOSIS — I10 Essential (primary) hypertension: Secondary | ICD-10-CM | POA: Diagnosis not present

## 2023-01-28 DIAGNOSIS — E039 Hypothyroidism, unspecified: Secondary | ICD-10-CM | POA: Diagnosis not present

## 2023-01-28 DIAGNOSIS — N1831 Chronic kidney disease, stage 3a: Secondary | ICD-10-CM | POA: Diagnosis not present

## 2023-02-01 ENCOUNTER — Inpatient Hospital Stay (HOSPITAL_COMMUNITY): Payer: Medicare Other

## 2023-02-01 ENCOUNTER — Inpatient Hospital Stay (HOSPITAL_COMMUNITY)
Admission: EM | Admit: 2023-02-01 | Discharge: 2023-02-04 | DRG: 916 | Disposition: A | Discharge status: Home / Self Care | Payer: Medicare Other | Attending: Internal Medicine | Admitting: Internal Medicine

## 2023-02-01 ENCOUNTER — Encounter (HOSPITAL_COMMUNITY): Payer: Self-pay

## 2023-02-01 ENCOUNTER — Other Ambulatory Visit: Payer: Self-pay

## 2023-02-01 DIAGNOSIS — Z803 Family history of malignant neoplasm of breast: Secondary | ICD-10-CM | POA: Diagnosis not present

## 2023-02-01 DIAGNOSIS — Z91041 Radiographic dye allergy status: Secondary | ICD-10-CM

## 2023-02-01 DIAGNOSIS — R220 Localized swelling, mass and lump, head: Secondary | ICD-10-CM | POA: Diagnosis present

## 2023-02-01 DIAGNOSIS — F419 Anxiety disorder, unspecified: Secondary | ICD-10-CM | POA: Diagnosis present

## 2023-02-01 DIAGNOSIS — F32A Depression, unspecified: Secondary | ICD-10-CM | POA: Diagnosis present

## 2023-02-01 DIAGNOSIS — D72829 Elevated white blood cell count, unspecified: Secondary | ICD-10-CM | POA: Diagnosis not present

## 2023-02-01 DIAGNOSIS — Z880 Allergy status to penicillin: Secondary | ICD-10-CM | POA: Diagnosis not present

## 2023-02-01 DIAGNOSIS — I10 Essential (primary) hypertension: Secondary | ICD-10-CM

## 2023-02-01 DIAGNOSIS — J45909 Unspecified asthma, uncomplicated: Secondary | ICD-10-CM | POA: Diagnosis present

## 2023-02-01 DIAGNOSIS — E7800 Pure hypercholesterolemia, unspecified: Secondary | ICD-10-CM | POA: Diagnosis not present

## 2023-02-01 DIAGNOSIS — I6621 Occlusion and stenosis of right posterior cerebral artery: Secondary | ICD-10-CM | POA: Diagnosis not present

## 2023-02-01 DIAGNOSIS — T783XXA Angioneurotic edema, initial encounter: Secondary | ICD-10-CM | POA: Diagnosis not present

## 2023-02-01 DIAGNOSIS — I672 Cerebral atherosclerosis: Secondary | ICD-10-CM | POA: Diagnosis present

## 2023-02-01 DIAGNOSIS — Z7982 Long term (current) use of aspirin: Secondary | ICD-10-CM

## 2023-02-01 DIAGNOSIS — Z8249 Family history of ischemic heart disease and other diseases of the circulatory system: Secondary | ICD-10-CM

## 2023-02-01 DIAGNOSIS — E66813 Obesity, class 3: Secondary | ICD-10-CM | POA: Diagnosis present

## 2023-02-01 DIAGNOSIS — Z79899 Other long term (current) drug therapy: Secondary | ICD-10-CM | POA: Diagnosis not present

## 2023-02-01 DIAGNOSIS — Z79890 Hormone replacement therapy (postmenopausal): Secondary | ICD-10-CM

## 2023-02-01 DIAGNOSIS — Z886 Allergy status to analgesic agent status: Secondary | ICD-10-CM

## 2023-02-01 DIAGNOSIS — Z6841 Body Mass Index (BMI) 40.0 and over, adult: Secondary | ICD-10-CM | POA: Diagnosis not present

## 2023-02-01 DIAGNOSIS — Z90710 Acquired absence of both cervix and uterus: Secondary | ICD-10-CM

## 2023-02-01 DIAGNOSIS — U099 Post covid-19 condition, unspecified: Secondary | ICD-10-CM | POA: Diagnosis present

## 2023-02-01 DIAGNOSIS — Z7951 Long term (current) use of inhaled steroids: Secondary | ICD-10-CM

## 2023-02-01 DIAGNOSIS — F909 Attention-deficit hyperactivity disorder, unspecified type: Secondary | ICD-10-CM | POA: Diagnosis present

## 2023-02-01 DIAGNOSIS — Z8673 Personal history of transient ischemic attack (TIA), and cerebral infarction without residual deficits: Secondary | ICD-10-CM | POA: Diagnosis not present

## 2023-02-01 DIAGNOSIS — I129 Hypertensive chronic kidney disease with stage 1 through stage 4 chronic kidney disease, or unspecified chronic kidney disease: Secondary | ICD-10-CM | POA: Diagnosis present

## 2023-02-01 DIAGNOSIS — Z7952 Long term (current) use of systemic steroids: Secondary | ICD-10-CM

## 2023-02-01 DIAGNOSIS — R001 Bradycardia, unspecified: Secondary | ICD-10-CM | POA: Diagnosis present

## 2023-02-01 DIAGNOSIS — E039 Hypothyroidism, unspecified: Secondary | ICD-10-CM | POA: Diagnosis present

## 2023-02-01 DIAGNOSIS — N179 Acute kidney failure, unspecified: Secondary | ICD-10-CM | POA: Diagnosis not present

## 2023-02-01 DIAGNOSIS — Z83438 Family history of other disorder of lipoprotein metabolism and other lipidemia: Secondary | ICD-10-CM

## 2023-02-01 DIAGNOSIS — T465X5A Adverse effect of other antihypertensive drugs, initial encounter: Secondary | ICD-10-CM | POA: Diagnosis present

## 2023-02-01 DIAGNOSIS — E876 Hypokalemia: Secondary | ICD-10-CM | POA: Diagnosis present

## 2023-02-01 DIAGNOSIS — E785 Hyperlipidemia, unspecified: Secondary | ICD-10-CM | POA: Diagnosis not present

## 2023-02-01 DIAGNOSIS — R29818 Other symptoms and signs involving the nervous system: Secondary | ICD-10-CM | POA: Diagnosis not present

## 2023-02-01 DIAGNOSIS — N1831 Chronic kidney disease, stage 3a: Secondary | ICD-10-CM | POA: Diagnosis present

## 2023-02-01 DIAGNOSIS — I6522 Occlusion and stenosis of left carotid artery: Secondary | ICD-10-CM | POA: Diagnosis not present

## 2023-02-01 DIAGNOSIS — Z85820 Personal history of malignant melanoma of skin: Secondary | ICD-10-CM

## 2023-02-01 DIAGNOSIS — G9389 Other specified disorders of brain: Secondary | ICD-10-CM | POA: Diagnosis not present

## 2023-02-01 DIAGNOSIS — R29810 Facial weakness: Secondary | ICD-10-CM | POA: Diagnosis present

## 2023-02-01 LAB — TROPONIN I (HIGH SENSITIVITY)
Troponin I (High Sensitivity): 4 ng/L (ref <18)
Troponin I (High Sensitivity): 4 ng/L (ref <18)

## 2023-02-01 LAB — BASIC METABOLIC PANEL
Anion gap: 10 (ref 5–15)
Anion gap: 10 (ref 5–15)
BUN: 20 mg/dL (ref 8–23)
BUN: 20 mg/dL (ref 8–23)
CO2: 28 mmol/L (ref 22–32)
CO2: 23 mmol/L (ref 22–32)
Calcium: 9.3 mg/dL (ref 8.9–10.3)
Calcium: 9 mg/dL (ref 8.9–10.3)
Chloride: 99 mmol/L (ref 98–111)
Chloride: 103 mmol/L (ref 98–111)
Creatinine, Ser: 1.05 mg/dL — ABNORMAL HIGH (ref 0.44–1.00)
Creatinine, Ser: 1.17 mg/dL — ABNORMAL HIGH (ref 0.44–1.00)
GFR, Estimated: 56 mL/min — ABNORMAL LOW (ref >60)
GFR, Estimated: 50 mL/min — ABNORMAL LOW (ref >60)
Glucose, Bld: 114 mg/dL — ABNORMAL HIGH (ref 70–99)
Glucose, Bld: 150 mg/dL — ABNORMAL HIGH (ref 70–99)
Potassium: 2.9 mmol/L — ABNORMAL LOW (ref 3.5–5.1)
Potassium: 4 mmol/L (ref 3.5–5.1)
Sodium: 137 mmol/L (ref 135–145)
Sodium: 136 mmol/L (ref 135–145)

## 2023-02-01 LAB — GLUCOSE, CAPILLARY
Glucose-Capillary: 147 mg/dL — ABNORMAL HIGH (ref 70–99)
Glucose-Capillary: 150 mg/dL — ABNORMAL HIGH (ref 70–99)
Glucose-Capillary: 152 mg/dL — ABNORMAL HIGH (ref 70–99)
Glucose-Capillary: 168 mg/dL — ABNORMAL HIGH (ref 70–99)
Glucose-Capillary: 174 mg/dL — ABNORMAL HIGH (ref 70–99)
Glucose-Capillary: 197 mg/dL — ABNORMAL HIGH (ref 70–99)

## 2023-02-01 LAB — CBC WITH DIFFERENTIAL/PLATELET
Abs Immature Granulocytes: 0.04 10*3/uL (ref 0.00–0.07)
Basophils Absolute: 0 10*3/uL (ref 0.0–0.1)
Basophils Relative: 0 %
Eosinophils Absolute: 0.2 10*3/uL (ref 0.0–0.5)
Eosinophils Relative: 2 %
HCT: 36.4 % (ref 36.0–46.0)
Hemoglobin: 12.5 g/dL (ref 12.0–15.0)
Immature Granulocytes: 0 %
Lymphocytes Relative: 25 %
Lymphs Abs: 2.4 10*3/uL (ref 0.7–4.0)
MCH: 31.9 pg (ref 26.0–34.0)
MCHC: 34.3 g/dL (ref 30.0–36.0)
MCV: 92.9 fL (ref 80.0–100.0)
Monocytes Absolute: 0.9 10*3/uL (ref 0.1–1.0)
Monocytes Relative: 10 %
Neutro Abs: 5.9 10*3/uL (ref 1.7–7.7)
Neutrophils Relative %: 63 %
Platelets: 354 10*3/uL (ref 150–400)
RBC: 3.92 MIL/uL (ref 3.87–5.11)
RDW: 12.9 % (ref 11.5–15.5)
WBC: 9.5 10*3/uL (ref 4.0–10.5)
nRBC: 0 % (ref 0.0–0.2)

## 2023-02-01 LAB — MRSA NEXT GEN BY PCR, NASAL: MRSA by PCR Next Gen: NOT DETECTED

## 2023-02-01 LAB — CBC
HCT: 35.2 % — ABNORMAL LOW (ref 36.0–46.0)
Hemoglobin: 11.8 g/dL — ABNORMAL LOW (ref 12.0–15.0)
MCH: 30.8 pg (ref 26.0–34.0)
MCHC: 33.5 g/dL (ref 30.0–36.0)
MCV: 91.9 fL (ref 80.0–100.0)
Platelets: 316 10*3/uL (ref 150–400)
RBC: 3.83 MIL/uL — ABNORMAL LOW (ref 3.87–5.11)
RDW: 12.6 % (ref 11.5–15.5)
WBC: 10 10*3/uL (ref 4.0–10.5)
nRBC: 0 % (ref 0.0–0.2)

## 2023-02-01 LAB — MAGNESIUM: Magnesium: 1.9 mg/dL (ref 1.7–2.4)

## 2023-02-01 LAB — HEPATIC FUNCTION PANEL
ALT: 22 U/L (ref 0–44)
AST: 20 U/L (ref 15–41)
Albumin: 3.7 g/dL (ref 3.5–5.0)
Alkaline Phosphatase: 70 U/L (ref 38–126)
Bilirubin, Direct: <0.1 mg/dL (ref 0.0–0.2)
Total Bilirubin: 0.6 mg/dL (ref <1.2)
Total Protein: 6.7 g/dL (ref 6.5–8.1)

## 2023-02-01 LAB — T4, FREE: Free T4: 0.87 ng/dL (ref 0.61–1.12)

## 2023-02-01 LAB — TSH: TSH: 1.052 u[IU]/mL (ref 0.350–4.500)

## 2023-02-01 MED ORDER — PREDNISOLONE ACETATE 1 % OP SUSP
1.0000 [drp] | Freq: Two times a day (BID) | OPHTHALMIC | Status: AC
  Administered 2023-02-01 – 2023-02-02 (×4): 1 [drp] via OPHTHALMIC
  Filled 2023-02-01: qty 5

## 2023-02-01 MED ORDER — TRANEXAMIC ACID-NACL 1000-0.7 MG/100ML-% IV SOLN
1000.0000 mg | Freq: Once | INTRAVENOUS | Status: AC
  Administered 2023-02-01: 1000 mg via INTRAVENOUS

## 2023-02-01 MED ORDER — DEXMETHYLPHENIDATE HCL ER 5 MG PO CP24
40.0000 mg | ORAL_CAPSULE | Freq: Every day | ORAL | Status: DC
  Administered 2023-02-02 – 2023-02-04 (×3): 40 mg via ORAL
  Filled 2023-02-01 (×3): qty 8

## 2023-02-01 MED ORDER — POTASSIUM CHLORIDE CRYS ER 20 MEQ PO TBCR: 20.0000 meq | EXTENDED_RELEASE_TABLET | Freq: Every day | ORAL | Status: DC

## 2023-02-01 MED ORDER — POLYETHYLENE GLYCOL 3350 17 G PO PACK: 17.0000 g | PACK | Freq: Every day | ORAL | Status: DC | PRN

## 2023-02-01 MED ORDER — MOMETASONE FURO-FORMOTEROL FUM 200-5 MCG/ACT IN AERO
2.0000 | INHALATION_SPRAY | Freq: Two times a day (BID) | RESPIRATORY_TRACT | Status: DC
  Administered 2023-02-01 – 2023-02-04 (×7): 2 via RESPIRATORY_TRACT
  Filled 2023-02-01: qty 8.8

## 2023-02-01 MED ORDER — DOCUSATE SODIUM 100 MG PO CAPS: 100.0000 mg | ORAL_CAPSULE | Freq: Two times a day (BID) | ORAL | Status: DC | PRN

## 2023-02-01 MED ORDER — NADOLOL 20 MG PO TABS
20.0000 mg | ORAL_TABLET | Freq: Every day | ORAL | Status: DC
  Filled 2023-02-01: qty 1

## 2023-02-01 MED ORDER — POTASSIUM CHLORIDE 10 MEQ/100ML IV SOLN
10.0000 meq | INTRAVENOUS | Status: AC
  Administered 2023-02-01 (×2): 10 meq via INTRAVENOUS
  Filled 2023-02-01 (×2): qty 100

## 2023-02-01 MED ORDER — LEVOTHYROXINE SODIUM 25 MCG PO TABS
25.0000 ug | ORAL_TABLET | Freq: Every day | ORAL | Status: DC
  Administered 2023-02-01: 25 ug via ORAL
  Filled 2023-02-01: qty 1

## 2023-02-01 MED ORDER — TRAZODONE HCL 50 MG PO TABS
50.0000 mg | ORAL_TABLET | Freq: Once | ORAL | Status: AC
  Administered 2023-02-01: 50 mg via ORAL
  Filled 2023-02-01: qty 1

## 2023-02-01 MED ORDER — POTASSIUM CHLORIDE 10 MEQ/100ML IV SOLN
10.0000 meq | INTRAVENOUS | Status: AC
  Administered 2023-02-01 (×4): 10 meq via INTRAVENOUS
  Filled 2023-02-01 (×4): qty 100

## 2023-02-01 MED ORDER — ATROPINE SULFATE 1 MG/10ML IJ SOSY
PREFILLED_SYRINGE | INTRAMUSCULAR | Status: AC
  Filled 2023-02-01: qty 10

## 2023-02-01 MED ORDER — ALPRAZOLAM 0.25 MG PO TABS
0.2500 mg | ORAL_TABLET | Freq: Three times a day (TID) | ORAL | Status: DC | PRN
  Administered 2023-02-01 – 2023-02-03 (×2): 0.25 mg via ORAL
  Filled 2023-02-01 (×2): qty 1

## 2023-02-01 MED ORDER — FAMOTIDINE IN NACL 20-0.9 MG/50ML-% IV SOLN
20.0000 mg | INTRAVENOUS | Status: DC
  Administered 2023-02-01: 20 mg via INTRAVENOUS
  Filled 2023-02-01: qty 50

## 2023-02-01 MED ORDER — FAMOTIDINE IN NACL 20-0.9 MG/50ML-% IV SOLN
20.0000 mg | Freq: Once | INTRAVENOUS | Status: AC
  Administered 2023-02-01: 20 mg via INTRAVENOUS
  Filled 2023-02-01: qty 50

## 2023-02-01 MED ORDER — PRAVASTATIN SODIUM 40 MG PO TABS
40.0000 mg | ORAL_TABLET | Freq: Every day | ORAL | Status: DC
  Administered 2023-02-01 – 2023-02-04 (×4): 40 mg via ORAL
  Filled 2023-02-01 (×4): qty 1

## 2023-02-01 MED ORDER — TRAZODONE HCL 50 MG PO TABS
100.0000 mg | ORAL_TABLET | Freq: Every day | ORAL | Status: DC
  Administered 2023-02-02 – 2023-02-03 (×2): 100 mg via ORAL
  Filled 2023-02-01 (×2): qty 2

## 2023-02-01 MED ORDER — KETOROLAC TROMETHAMINE 0.5 % OP SOLN
1.0000 [drp] | Freq: Every day | OPHTHALMIC | Status: DC
  Administered 2023-02-03 – 2023-02-04 (×2): 1 [drp] via OPHTHALMIC
  Filled 2023-02-01: qty 3

## 2023-02-01 MED ORDER — IOHEXOL 350 MG/ML SOLN
75.0000 mL | Freq: Once | INTRAVENOUS | Status: AC | PRN
  Administered 2023-02-01: 75 mL via INTRAVENOUS

## 2023-02-01 MED ORDER — HEPARIN SODIUM (PORCINE) 5000 UNIT/ML IJ SOLN
5000.0000 [IU] | Freq: Three times a day (TID) | INTRAMUSCULAR | Status: DC
  Administered 2023-02-01 – 2023-02-04 (×8): 5000 [IU] via SUBCUTANEOUS
  Filled 2023-02-01 (×8): qty 1

## 2023-02-01 MED ORDER — HYDROCHLOROTHIAZIDE 25 MG PO TABS
25.0000 mg | ORAL_TABLET | Freq: Every morning | ORAL | Status: DC
  Administered 2023-02-01 – 2023-02-04 (×4): 25 mg via ORAL
  Filled 2023-02-01 (×4): qty 1

## 2023-02-01 MED ORDER — MOXIFLOXACIN HCL 0.5 % OP SOLN
1.0000 [drp] | Freq: Every day | OPHTHALMIC | Status: DC
  Administered 2023-02-03 – 2023-02-04 (×2): 1 [drp] via OPHTHALMIC
  Filled 2023-02-01: qty 3

## 2023-02-01 MED ORDER — DIPHENHYDRAMINE HCL 50 MG/ML IJ SOLN
25.0000 mg | Freq: Once | INTRAMUSCULAR | Status: AC
  Administered 2023-02-01: 25 mg via INTRAVENOUS
  Filled 2023-02-01: qty 1

## 2023-02-01 MED ORDER — TRANEXAMIC ACID-NACL 1000-0.7 MG/100ML-% IV SOLN
1000.0000 mg | INTRAVENOUS | Status: DC
  Filled 2023-02-01: qty 100

## 2023-02-01 MED ORDER — METHYLPREDNISOLONE SODIUM SUCC 125 MG IJ SOLR
125.0000 mg | INTRAMUSCULAR | Status: AC
  Administered 2023-02-01: 125 mg via INTRAVENOUS
  Filled 2023-02-01 (×2): qty 2

## 2023-02-01 MED ORDER — TRAZODONE HCL 50 MG PO TABS
50.0000 mg | ORAL_TABLET | Freq: Every day | ORAL | Status: DC
  Administered 2023-02-01: 50 mg via ORAL
  Filled 2023-02-01: qty 1

## 2023-02-01 MED ORDER — HYDRALAZINE HCL 20 MG/ML IJ SOLN: 10.0000 mg | INTRAMUSCULAR | Status: DC | PRN

## 2023-02-01 MED ORDER — MOXIFLOXACIN HCL 0.5 % OP SOLN
1.0000 [drp] | Freq: Two times a day (BID) | OPHTHALMIC | Status: AC
  Administered 2023-02-01 – 2023-02-02 (×4): 1 [drp] via OPHTHALMIC
  Filled 2023-02-01: qty 3

## 2023-02-01 MED ORDER — PANTOPRAZOLE SODIUM 40 MG PO TBEC: 40.0000 mg | DELAYED_RELEASE_TABLET | Freq: Two times a day (BID) | ORAL | Status: DC

## 2023-02-01 MED ORDER — KETOROLAC TROMETHAMINE 0.5 % OP SOLN
1.0000 [drp] | Freq: Two times a day (BID) | OPHTHALMIC | Status: AC
  Administered 2023-02-01 – 2023-02-02 (×4): 1 [drp] via OPHTHALMIC
  Filled 2023-02-01: qty 5

## 2023-02-01 MED ORDER — PRAMIPEXOLE DIHYDROCHLORIDE 0.25 MG PO TABS
0.1250 mg | ORAL_TABLET | Freq: Every day | ORAL | Status: DC
  Administered 2023-02-01 – 2023-02-03 (×3): 0.125 mg via ORAL
  Filled 2023-02-01 (×4): qty 1

## 2023-02-01 MED ORDER — FAMOTIDINE 20 MG PO TABS: 20.0000 mg | ORAL_TABLET | Freq: Two times a day (BID) | ORAL | Status: DC

## 2023-02-01 MED ORDER — CHLORHEXIDINE GLUCONATE CLOTH 2 % EX PADS
6.0000 | MEDICATED_PAD | Freq: Every day | CUTANEOUS | Status: DC
  Administered 2023-02-01 – 2023-02-03 (×4): 6 via TOPICAL

## 2023-02-01 MED ORDER — EPINEPHRINE 0.3 MG/0.3ML IJ SOAJ
0.3000 mg | Freq: Once | INTRAMUSCULAR | Status: AC
  Administered 2023-02-01: 0.3 mg via INTRAMUSCULAR
  Filled 2023-02-01 (×2): qty 0.3

## 2023-02-01 MED ORDER — ONDANSETRON HCL 4 MG/2ML IJ SOLN
4.0000 mg | Freq: Once | INTRAMUSCULAR | Status: DC
  Filled 2023-02-01: qty 2

## 2023-02-01 MED ORDER — PREDNISOLONE ACETATE 1 % OP SUSP
1.0000 [drp] | Freq: Every day | OPHTHALMIC | Status: DC
  Administered 2023-02-03 – 2023-02-04 (×2): 1 [drp] via OPHTHALMIC
  Filled 2023-02-01: qty 1

## 2023-02-01 MED ORDER — DIPHENHYDRAMINE HCL 25 MG PO CAPS: 50.0000 mg | ORAL_CAPSULE | Freq: Once | ORAL | Status: AC

## 2023-02-01 MED ORDER — MAGNESIUM SULFATE 2 GM/50ML IV SOLN
2.0000 g | Freq: Once | INTRAVENOUS | Status: AC
  Administered 2023-02-01: 2 g via INTRAVENOUS
  Filled 2023-02-01: qty 50

## 2023-02-01 MED ORDER — DIPHENHYDRAMINE HCL 50 MG/ML IJ SOLN
50.0000 mg | Freq: Once | INTRAMUSCULAR | Status: AC
  Administered 2023-02-01: 50 mg via INTRAVENOUS
  Filled 2023-02-01: qty 1

## 2023-02-01 MED ORDER — ASPIRIN 325 MG PO TBEC
325.0000 mg | DELAYED_RELEASE_TABLET | Freq: Two times a day (BID) | ORAL | Status: DC
  Administered 2023-02-01 – 2023-02-02 (×3): 325 mg via ORAL
  Filled 2023-02-01 (×3): qty 1

## 2023-02-01 MED ORDER — ARIPIPRAZOLE 5 MG PO TABS
5.0000 mg | ORAL_TABLET | Freq: Every day | ORAL | Status: DC
  Administered 2023-02-01 – 2023-02-04 (×4): 5 mg via ORAL
  Filled 2023-02-01 (×4): qty 1

## 2023-02-01 MED ORDER — METHYLPREDNISOLONE SODIUM SUCC 40 MG IJ SOLR
40.0000 mg | Freq: Once | INTRAMUSCULAR | Status: AC
Start: 2023-02-01 — End: 2023-02-01
  Administered 2023-02-01: 40 mg via INTRAVENOUS
  Filled 2023-02-01: qty 1

## 2023-02-01 MED ORDER — ALBUTEROL SULFATE (2.5 MG/3ML) 0.083% IN NEBU
2.5000 mg | INHALATION_SOLUTION | RESPIRATORY_TRACT | Status: DC | PRN
  Administered 2023-02-01 – 2023-02-03 (×2): 2.5 mg via RESPIRATORY_TRACT
  Filled 2023-02-01 (×3): qty 3

## 2023-02-01 NOTE — Progress Notes (Signed)
NAME:  Becky [name], MRN:  [id number], DOB:  06-Feb-1951, LOS: 0 ADMISSION DATE:  02/01/2023, CONSULTATION DATE:  12/1 REFERRING MD:  Dr. Nicanor Alcon, CHIEF COMPLAINT:  angioedema   History of Present Illness:  Patient is a 72 yo F w/ pertinent PMH asthma, CVA, HTN, HLD, hypothyroidism, CKD presents to [facility] ED on 12/1 w/ angioedema.  Patient came to [facility] ED on 12/1 for swelling in mouth/tongue. Patient is on telmisartan and norvasc. Patient afebrile and hemodynamically stable. Sats 97% on room air. Patient denies difficulty breathing. Found to have tongue swelling concerning for angioedema. Patient given epi pen, solumedrol, pepcid, benadryl and tranexamic acid. ENT consulted and recommended transfer to ICU at [facility]. PCCM to admit.  Pertinent  Medical History   Past Medical History:  Diagnosis Date   Chronic kidney disease    CVA (cerebral vascular accident) (HCC) 11/14/2013   Depression    followed by Dr Arletha Pili frequently 10/01/2015   Genital herpes    History of melanoma    followed by derm Dr Emily Filbert   Hyperlipidemia    Hypertension    MCI (mild cognitive impairment) 10/01/2015   Pure hypercholesterolemia 11/14/2013   Stroke [facility])    Old stroke seen on MRI 12/2009, carotid doppler w minimal plaque, MRI off the head otherwise neg for acute disease.   Thyroid disease    hypothyroid. Followed by Dr Evelene Croon   TIA (transient ischemic attack) 8/08   followed by neurologist Dr Pearlean Brownie     [facility] Events: Including procedures, antibiotic start and stop dates in addition to other pertinent events   12/1 admitted w/ angioedema  Interim History / Subjective:   CTA Head/Neck and CT Head unremarkable for acute pathology.  ENT evaluated patient this morning.  Objective   Blood pressure (!) 154/85, pulse 69, temperature 97.9 F (36.6 C), temperature source Oral, resp. rate 19, weight 96.6 kg, SpO2 99%.        Intake/Output Summary (Last 24 hours) at 02/01/2023 1555 Last  data filed at 02/01/2023 1200 Gross per 24 hour  Intake 547.62 ml  Output 200 ml  Net 347.62 ml   Filed Weights   02/01/23 0703  Weight: 96.6 kg    Examination: General: NAD HEENT: MM pink/moist; tongue swelling appreciated; vocalizing well but garbled at times Neuro: Aox3; MAE; mild left sided facial drooping; b/l muscle strength UE and LE, dilated left pupil but reactive to light CV: s1s2, RRR, no m/r/g PULM:  clear to auscultation bilaterally GI: soft, bsx4 active  Extremities: warm/dry, ble edema  Skin: no rashes or lesions    Resolved Hospital Problem list     Assessment & Plan:  Angioedema -is on telmisartan and norvasc with both having low risk of angioedema Plan: -cont benadryl and H2B -iv steroids -given tranexamic acid and epi -cont to monitor in icu overnight for airway swelling -ENT consulted; appreciate recs  Hx of HTN/HLD Plan: -hold anti-hypertensives/statin while npo -prn hydralazine for htn -consider dc'ing norvasc and/or telmisartan  Hx of asthma Plan: -albuterol prn -cont dulera  CKD 3a Hypokalemia Plan: -replete K -Trend BMP / urinary output -Replace electrolytes as indicated -Avoid nephrotoxic agents, ensure adequate renal perfusion  Left sided facial droop: could be due to swelling Hx of CVA Plan: -CTA Head and CT Head unremarkable -asa and statin when able to take po  Hx of anxiety/depression Plan: -hold home meds while npo  Hypothyroidism Plan: -resume synthroid later today  Hx of HA Plan: -hold topamax  while npo  Recent cataract surgery - resume home eye drops  Best Practice (right click and "Reselect all SmartList Selections" daily)   Diet/type: Regular consistency (see orders) DVT prophylaxis: heparin subcu   Pressure ulcer(s): not present on admission  GI prophylaxis: H2B Lines: N/A Foley:  N/A Code Status:  full code Last date of multidisciplinary goals of care discussion [12/1 updated patient Becky [name] at  bedside]  Labs   CBC: Recent Labs  Lab 02/01/23 0225 02/01/23 0810  WBC 9.5 10.0  NEUTROABS 5.9  --   HGB 12.5 11.8*  HCT 36.4 35.2*  MCV 92.9 91.9  PLT 354 316    Basic Metabolic Panel: Recent Labs  Lab 02/01/23 0225 02/01/23 0810  NA 137 136  K 2.9* 4.0  CL 99 103  CO2 28 23  GLUCOSE 114* 150*  BUN 20 20  CREATININE 1.05* 1.17*  CALCIUM 9.3 9.0  MG  --  1.9   GFR: Estimated Creatinine Clearance: 45.2 mL/min (A) (by C-G formula based on SCr of 1.17 mg/dL (H)). Recent Labs  Lab 02/01/23 0225 02/01/23 0810  WBC 9.5 10.0    Liver Function Tests: Recent Labs  Lab 02/01/23 0810  AST 20  ALT 22  ALKPHOS 70  BILITOT 0.6  PROT 6.7  ALBUMIN 3.7   No results for input(s): "LIPASE", "AMYLASE" in the last 168 hours. No results for input(s): "AMMONIA" in the last 168 hours.  ABG    Component Value Date/Time   PHART 7.54 (H) 08/17/2021 2158   PCO2ART 30 (L) 08/17/2021 2158   PO2ART 89 08/17/2021 2158   HCO3 25.7 08/17/2021 2158   TCO2 24 11/01/2006 2104   ACIDBASEDEF 1.0 11/01/2006 2104   O2SAT 98.3 08/17/2021 2158     Coagulation Profile: No results for input(s): "INR", "PROTIME" in the last 168 hours.  Cardiac Enzymes: No results for input(s): "CKTOTAL", "CKMB", "CKMBINDEX", "TROPONINI" in the last 168 hours.  HbA1C: Hgb A1c MFr Bld  Date/Time Value Ref Range Status  11/01/2006 10:52 PM   Final   5.6 (NOTE)   The ADA recommends the following therapeutic goals for glycemic   control related to Hgb A1C measurement:   Goal of Therapy:   < 7.0% Hgb A1C   Action Suggested:  > 8.0% Hgb A1C   Ref:  Diabetes Care, 22, Suppl. 1, 1999    CBG: Recent Labs  Lab 02/01/23 0401 02/01/23 0728 02/01/23 1104 02/01/23 1514  GLUCAP 147* 152* 168* 174*       Critical care time:     Melody Comas, MD [facility] Office: [phone]   See Amion for personal pager PCCM on call pager [phone] until 7pm. Please call Elink  7p-7a. [phone]

## 2023-02-01 NOTE — Consult Note (Signed)
ENT CONSULT:  Reason for Consult: Angioedema  Referring Physician:  April Palumbo MD  HPI: Becky [name] is an 72 y.o. female with a history of morbid obesity, CVA, who presented to the ED overnight due to subjective symptoms of tongue swelling. On ARB. Noted to have oral cavity/tongue angioedema by ED team at [facility]. Started on medical mgmt for angioedema and transferred to [facility] cone ICU. On supplemental O2 [place], no worsening respiratory symptoms. Undergoing stroke workup this AM starting at 715 due to pupil asymmetry and facial droop.  Patient endorsing malaise/body soreness. No shortness of breath or difficulty breathing. Oral cavity less swollen than on immediate presentation.    Past Medical History:  Diagnosis Date   Chronic kidney disease    CVA (cerebral vascular accident) (HCC) 11/14/2013   Depression    followed by Dr Arletha Pili frequently 10/01/2015   Genital herpes    History of melanoma    followed by derm Dr Emily Filbert   Hyperlipidemia    Hypertension    MCI (mild cognitive impairment) 10/01/2015   Pure hypercholesterolemia 11/14/2013   Stroke [facility])    Old stroke seen on MRI 12/2009, carotid doppler w minimal plaque, MRI off the head otherwise neg for acute disease.   Thyroid disease    hypothyroid. Followed by Dr Evelene Croon   TIA (transient ischemic attack) 8/08   followed by neurologist Dr Pearlean Brownie    Past Surgical History:  Procedure Laterality Date   ABDOMINAL HYSTERECTOMY     APPENDECTOMY     CHOLECYSTECTOMY     MELANOMA EXCISION     x2    Family History  Problem Relation Age of Onset   Breast cancer Mother    Hyperlipidemia Father    Hypertension Father    Heart attack Father    Hypertension Sister    Hyperlipidemia Sister    Dementia Maternal Aunt     Social History:  reports that she has never smoked. She has never used smokeless tobacco. She reports that she does not drink alcohol and does not use drugs.  Allergies:  Allergies  Allergen  Reactions   Penicillins Anaphylaxis   Iodinated Contrast Media Other (See Comments)   Iohexol Other (See Comments)     Desc: iodine    Nsaids Other (See Comments)    CKD     Medications: I have reviewed the patient's current medications.  Results for orders placed or performed during the hospital encounter of 02/01/23 (from the past 48 hour(s))  CBC with Differential     Status: None   Collection Time: 02/01/23  2:25 AM  Result Value Ref Range   WBC 9.5 4.0 - 10.5 K/uL   RBC 3.92 3.87 - 5.11 MIL/uL   Hemoglobin 12.5 12.0 - 15.0 g/dL   HCT 40.9 81.1 - 91.4 %   MCV 92.9 80.0 - 100.0 fL   MCH 31.9 26.0 - 34.0 pg   MCHC 34.3 30.0 - 36.0 g/dL   RDW 78.2 95.6 - 21.3 %   Platelets 354 150 - 400 K/uL   nRBC 0.0 0.0 - 0.2 %   Neutrophils Relative % 63 %   Neutro Abs 5.9 1.7 - 7.7 K/uL   Lymphocytes Relative 25 %   Lymphs Abs 2.4 0.7 - 4.0 K/uL   Monocytes Relative 10 %   Monocytes Absolute 0.9 0.1 - 1.0 K/uL   Eosinophils Relative 2 %   Eosinophils Absolute 0.2 0.0 - 0.5 K/uL   Basophils Relative 0 %  Basophils Absolute 0.0 0.0 - 0.1 K/uL   Immature Granulocytes 0 %   Abs Immature Granulocytes 0.04 0.00 - 0.07 K/uL    Comment: Performed at [facility], [address] [place], [place] [address]  Basic metabolic panel     Status: Abnormal   Collection Time: 02/01/23  2:25 AM  Result Value Ref Range   Sodium 137 135 - 145 mmol/L   Potassium 2.9 (L) 3.5 - 5.1 mmol/L   Chloride 99 98 - 111 mmol/L   CO2 28 22 - 32 mmol/L   Glucose, Bld 114 (H) 70 - 99 mg/dL    Comment: Glucose reference range applies only to samples taken after fasting for at least 8 hours.   BUN 20 8 - 23 mg/dL   Creatinine, Ser 4.13 (H) 0.44 - 1.00 mg/dL   Calcium 9.3 8.9 - 24.4 mg/dL   GFR, Estimated 56 (L) >60 mL/min    Comment: (NOTE) Calculated using the CKD-EPI Creatinine Equation (2021)    Anion gap 10 5 - 15    Comment: Performed at [facility], [address] [place], [place] [address]  Troponin I (High Sensitivity)     Status: None   Collection Time: 02/01/23  2:25 AM  Result Value Ref Range   Troponin I (High Sensitivity) 4 <18 ng/L    Comment: (NOTE) Elevated high sensitivity troponin I (hsTnI) values and significant  changes across serial measurements may suggest ACS but many other  chronic and acute conditions are known to elevate hsTnI results.  Refer to the "Links" section for chest pain algorithms and additional  guidance. Performed at [facility], [address] [place], [place] [address]   Glucose, capillary     Status: Abnormal   Collection Time: 02/01/23  4:01 AM  Result Value Ref Range   Glucose-Capillary 147 (H) 70 - 99 mg/dL    Comment: Glucose reference range applies only to samples taken after fasting for at least 8 hours.  MRSA Next Gen by PCR, Nasal     Status: None   Collection Time: 02/01/23  4:05 AM   Specimen: Nasal Mucosa; Nasal Swab  Result Value Ref Range   MRSA by PCR Next Gen NOT DETECTED NOT DETECTED    Comment: (NOTE) The GeneXpert MRSA Assay (FDA approved for NASAL specimens only), is one component of a comprehensive MRSA colonization surveillance program. It is not intended to diagnose MRSA infection nor to guide or monitor treatment for MRSA infections. Test performance is not FDA approved in patients less than 81 years old. Performed at [facility], [address] [place], [place] [address]   Troponin I (High Sensitivity)     Status: None   Collection Time: 02/01/23  4:16 AM  Result Value Ref Range   Troponin I (High Sensitivity) 4 <18 ng/L    Comment: (NOTE) Elevated high sensitivity troponin I (hsTnI) values and significant  changes across serial measurements may suggest ACS but many other  chronic and acute conditions are known to elevate hsTnI results.  Refer to the "Links" section for chest pain algorithms and additional  guidance. Performed at [facility], [address] [place], [place] [address]   Glucose, capillary     Status: Abnormal   Collection Time: 02/01/23  7:28 AM  Result Value Ref Range   Glucose-Capillary 152 (H) 70 - 99 mg/dL    Comment: Glucose reference range applies only to samples taken after  fasting for at least 8 hours.    CT ANGIO HEAD NECK W WO CM  Result Date: 02/01/2023 CLINICAL DATA:  Neuro deficit with stroke suspected/possible. EXAM: CT ANGIOGRAPHY HEAD AND NECK WITH AND WITHOUT CONTRAST TECHNIQUE: Multidetector CT imaging of the head and neck was performed using the standard protocol during bolus administration of intravenous contrast. Multiplanar CT image reconstructions and MIPs were obtained to evaluate the vascular anatomy. Carotid stenosis measurements (when applicable) are obtained utilizing NASCET criteria, using the distal internal carotid diameter as the denominator. RADIATION DOSE REDUCTION: This exam was performed according to the departmental dose-optimization program which includes automated exposure control, adjustment of the mA and/or kV according to patient size and/or use of iterative reconstruction technique. CONTRAST:  75mL OMNIPAQUE IOHEXOL 350 MG/ML SOLN COMPARISON:  Head CT from earlier today FINDINGS: CTA NECK FINDINGS Aortic arch: Atheromatous plaque with 3 vessel branching Right carotid system: Partial retropharyngeal course. No stenosis, beading, or ulceration Left carotid system: Calcified plaque at the distal common carotid and proximal ICA without ulceration, beading, or significant stenosis. Vertebral arteries: Mild proximal subclavian atherosclerosis on the left. The vertebral arteries are smoothly contoured and widely patent to the dura. Skeleton: No acute finding Other neck: No acute finding Upper chest: Negative Review of the MIP images confirms the above findings CTA HEAD FINDINGS Anterior circulation: Atheromatous calcification of the cavernous carotids. No branch occlusion,  beading, or aneurysm. Mild atheromatous irregularity of intracranial branches. Posterior circulation: The vertebral and basilar arteries are smoothly contoured and widely patent. Fetal type left PCA flow. No branch occlusion, beading, or aneurysm. Moderate atheromatous irregularity of intracranial branches, greatest at the right PCA where there is moderate P2 segment narrowing. Additional left PCA branch stenosis that is moderate or advanced. Venous sinuses: Unremarkable for arterial timing. Anatomic variants: As above Review of the MIP images confirms the above findings IMPRESSION: 1. No emergent finding. 2. Atherosclerosis in the neck without significant stenosis or vessel irregularity. 3. Generalized intracranial atherosclerosis with up to moderate right PCA stenosis. Electronically Signed   By: Tiburcio Pea M.D.   On: 02/01/2023 09:00   CT HEAD WO CONTRAST ( )  Result Date: 02/01/2023 CLINICAL DATA:  Neuro deficit with stroke suspected EXAM: CT HEAD WITHOUT CONTRAST TECHNIQUE: Contiguous axial images were obtained from the base of the skull through the vertex without intravenous contrast. RADIATION DOSE REDUCTION: This exam was performed according to the departmental dose-optimization program which includes automated exposure control, adjustment of the mA and/or kV according to patient size and/or use of iterative reconstruction technique. COMPARISON:  07/08/2022 brain MRI FINDINGS: Brain: No evidence of acute infarction, hemorrhage, hydrocephalus, extra-axial collection or mass lesion/mass effect. Chronic left anterior frontal encephalomalacia involving a moderate area. Disproportionate subarachnoid spaces with perisylvian widening and some narrowing of the callosal angle, but no ventriculomegaly to the degree that NPH is implicated. Focal atrophy may contribute to this appearance. Vascular: No hyperdense vessel or unexpected calcification. Skull: Normal. Negative for fracture or focal lesion.  Sinuses/Orbits: No acute finding. IMPRESSION: No acute finding.  Stable compared to brain MRI earlier this year. Electronically Signed   By: Tiburcio Pea M.D.   On: 02/01/2023 08:54    ACZ:[id number] other than stated per HPI  Blood pressure 137/76, pulse 65, temperature 98 F (36.7 C), temperature source Axillary, resp. rate 18, SpO2 96%.  PHYSICAL EXAM:  Resting in bed supine with [place]. Oral cavity with moderate tongue edema/FOM edema. Tolerating secretions and breathing comfortably without increased WOB or stridor. Neck with distant  landmarks otherwise soft and supple.   Studies Reviewed: CT Angio head/neck 02/01/23  IMPRESSION: 1. No emergent finding. 2. Atherosclerosis in the neck without significant stenosis or vessel irregularity. 3. Generalized intracranial atherosclerosis with up to moderate right PCA stenosis.     Electronically Signed   By: Tiburcio Pea M.D.   On: 02/01/2023 09:00  CT head without contrast 02/01/23   FINDINGS: Brain: No evidence of acute infarction, hemorrhage, hydrocephalus, extra-axial collection or mass lesion/mass effect. Chronic left anterior frontal encephalomalacia involving a moderate area. Disproportionate subarachnoid spaces with perisylvian widening and some narrowing of the callosal angle, but no ventriculomegaly to the degree that NPH is implicated. Focal atrophy may contribute to this appearance.   Vascular: No hyperdense vessel or unexpected calcification.   Skull: Normal. Negative for fracture or focal lesion.   Sinuses/Orbits: No acute finding.   IMPRESSION: No acute finding.  Stable compared to brain MRI earlier this year.     Electronically Signed   By: Tiburcio Pea M.D.   On: 02/01/2023 08:54    Assessment/Plan: 72 year old female with signs and symptoms of angioedema that started around midnight, responding to medical treatment. No signs or symptoms of impending airway compromise/need for surgical airway.  Possible new stroke like symptoms.   Recommendations: - Continue supportive medical mgmt for PCCM - ENT available for surgical airway if indicated   Medical Decision Making: 4 - High  #/Compl Problems  4                         Data Rev  4                 Management 3             (1-Straightforward, 2-Low, 3- Moderate, 4-High)   Electronically signed by:  Scarlette Ar, MD  Staff Physician Facial Plastic & Reconstructive Surgery Otolaryngology - Head and Neck Surgery [facility]   02/01/2023, 9:14 AM

## 2023-02-01 NOTE — Progress Notes (Addendum)
eLink Physician-Brief Progress Note Patient Name: Becky [name] DOB: March 02, 1951 MRN: [id number]   Date of Service  02/01/2023  HPI/Events of Note  Pt requesting to resume her home medication of trazodone.  Pt is wide awake and says that she takes 2 tabs of trazodone at home.  This could not be confirmed on her medication list.   eICU Interventions  Trial of trazodone 50mg  at bedtime.  Need to verify medication list in the morning.      Intervention Category Intermediate Interventions: Other:  Larinda Buttery 02/01/2023, 10:46 PM  11:01 PM Pt reported to the RN that she takes total of 100mg  of trazodone at home.   Plan> Ok to give trazodone total of 100mg  tonight.

## 2023-02-01 NOTE — Progress Notes (Signed)
eLink Physician-Brief Progress Note Patient Name: Becky [name] DOB: 1950-05-02 MRN: [id number]   Date of Service  02/01/2023  HPI/Events of Note    eICU Interventions     Asymptomatic Bradycardia lowest is 40 BP 120/64 Will continue to observe closely     [name] 02/01/2023, 5:19 AM

## 2023-02-01 NOTE — Progress Notes (Signed)
During bedside shift report a change in pupil size in comparison to previous assessment   was observed. Pupils now unequal ( Left 4 , Right 3) but brisk.  Pt stated she has had a recent cataract surgery on her left eye. Pt currently A &O x 4. MD and day shift RN notified.

## 2023-02-01 NOTE — Plan of Care (Signed)
  Problem: Education: Goal: Knowledge of General Education information will improve Description: Including pain rating scale, medication(s)/side effects and non-pharmacologic comfort measures Outcome: Progressing   Problem: Clinical Measurements: Goal: Will remain free from infection Outcome: Progressing Goal: Diagnostic test results will improve Outcome: Progressing Goal: Respiratory complications will improve Outcome: Progressing   

## 2023-02-01 NOTE — Progress Notes (Signed)
eLink Physician-Brief Progress Note Patient Name: Becky [name] DOB: [birth year] MRN: [id number]   Date of Service  02/01/2023  HPI/Events of Note    eICU Interventions     Angiodema  Epi H2 blockers GCC Received tranexamic acid  Monitor closely for airway swelling     [name] 02/01/2023, 4:31 AM

## 2023-02-01 NOTE — Progress Notes (Signed)
PCCM Update:  Called to bedside by nursing due to concern of mild left sided facial droop and dilated but responsive left pupil. Both pupils are reactive to light. No focal neuro deficits noted on exam. She has mildly slurred speech which is similar to before with the angioedema.   Pupils last known normal was 6:30am and found to be different at 7:15am.   CT head and CTA head/neck ordered.  Melody Comas, MD [facility] Office: [phone]   See Amion for personal pager PCCM on call pager [phone] until 7pm. Please call Elink 7p-7a. [phone]

## 2023-02-01 NOTE — ED Triage Notes (Signed)
Pt to ED by POV from home with c/o swelling to her mouth and tongue which started approx 20 minutes ago. Pt states she was drinking pepsi and eating some trailmix when this occurred. Noteable swelling noted. Dr Nicanor Alcon at bedside upon arrival. Pt endorses use of an ace inhibitor.Marland Kitchen

## 2023-02-01 NOTE — ED Notes (Signed)
[facility] called, ETA 

## 2023-02-01 NOTE — ED Notes (Signed)
[facility] called for truck to be dispatched emergency traffic to [facility] for transport to [facility] ICU

## 2023-02-01 NOTE — ED Provider Notes (Signed)
[place] EMERGENCY DEPARTMENT AT [facility] Provider Note   CSN: [id number] Arrival date & time: 02/01/23  0272     History  Chief Complaint  Patient presents with   Oral Swelling    Becky [name] is a 72 y.o. female.  The history is provided by the spouse, the patient and medical records. The history is limited by the condition of the patient.  Allergic Reaction Presenting symptoms: swelling   Presenting symptoms: no difficulty breathing and no wheezing   Presenting symptoms comment:  Tongue Severity:  Severe Duration:  30 minutes Prior allergic episodes:  Unable to specify Context: medications   Context comment:  Micardis, ARB but also ate trail mix and drank a pepsi Relieved by:  Nothing Worsened by:  Nothing Ineffective treatments:  None tried Patient with long covid who saw pulmonary on 11/20 and complained of dyspnea and jaw/tongue discomfort and swelling at that time presents with swelling of the tongue for 30 minutes.  Last dose of ARB was in am.  Did eat trail mix and pepsi PTA.       Home Medications Prior to Admission medications   Medication Sig Start Date End Date Taking? Authorizing Provider  albuterol (ACCUNEB) 0.63 MG/3ML nebulizer solution Take 3 mLs (0.63 mg total) by nebulization every 6 (six) hours as needed for wheezing. 05/23/21   Luciano Cutter, MD  albuterol (PROVENTIL) (2.5 MG/3ML) 0.083% nebulizer solution USE 1 VIAL IN NEBULIZER EVERY 6 HOURS AS NEEDED FOR SHORTNESS OF BREATH AND FOR WHEEZING 11/10/22   Luciano Cutter, MD  albuterol (VENTOLIN HFA) 108 (90 Base) MCG/ACT inhaler Inhale 1 puff into the lungs every 4 (four) hours as needed for wheezing. 12/31/20   [provider]  ALPRAZolam Prudy Feeler) 0.25 MG tablet Take 0.25 mg by mouth 3 (three) times daily as needed for anxiety or sleep. 08/10/15   [provider]  amLODipine (NORVASC) 5 MG tablet Take 5 mg by mouth daily.  12/24/16   [provider]   ARIPiprazole (ABILIFY) 5 MG tablet Take 5 mg by mouth daily. 01/23/21   [provider]  aspirin EC 325 MG tablet Take 325 mg by mouth 2 (two) times daily.     [provider]  azithromycin (ZITHROMAX Z-PAK) 250 MG tablet Take 2 tablets day 1 and then 1 daily for 4 days 01/08/23   Virl Diamond A, MD  desvenlafaxine (PRISTIQ) 100 MG 24 hr tablet Take 100 mg by mouth daily. 09/13/19   [provider]  Dexmethylphenidate HCl 40 MG CP24 Take 40 mg daily by mouth. 01/08/17   [provider]  fluticasone-salmeterol (ADVAIR DISKUS) 500-50 MCG/ACT AEPB Inhale 1 puff into the lungs in the morning and at bedtime. 04/25/22   Luciano Cutter, MD  hydrochlorothiazide (HYDRODIURIL) 25 MG tablet Take 25 mg by mouth every morning. 08/01/21   [provider]  levothyroxine (SYNTHROID, LEVOTHROID) 25 MCG tablet Take 25 mcg by mouth daily before breakfast.    [provider]  nadolol (CORGARD) 20 MG tablet Take 1 tablet (20 mg total) by mouth daily. Patient needs appt for # 90 supply or future refills. Please call office to schedule appt. 07/16/22   Camnitz, Andree Coss, MD  omeprazole (PRILOSEC) 40 MG capsule Take 1 capsule (40 mg total) by mouth every morning. 08/15/21   Luciano Cutter, MD  pantoprazole (PROTONIX) 40 MG tablet Take 40 mg by mouth 2 (two) times daily. 08/01/21   [provider]  potassium  chloride (KLOR-CON M) 10 MEQ tablet Take 2 tablets (20 mEq total) by mouth daily. 08/20/21   Alwyn Ren, MD  pramipexole (MIRAPEX) 0.125 MG tablet Take 0.125 mg by mouth at bedtime. 12/26/15   [provider]  pravastatin (PRAVACHOL) 40 MG tablet Take 40 mg by mouth daily. 08/01/21   [provider]  predniSONE (DELTASONE) 10 MG tablet Take 10 mg by mouth 2 (two) times daily. 01/05/23   [provider]  telmisartan (MICARDIS) 80 MG tablet Take 80 mg by mouth daily. 01/01/21   [provider]  topiramate (TOPAMAX)  25 MG tablet Take 1 tablet (25 mg total) by mouth 2 (two) times daily. 01/29/22   Micki Riley, MD      Allergies    Penicillins, Iodinated contrast media, Iohexol, and Nsaids    Review of Systems   Review of Systems  Unable to perform ROS: Acuity of condition  HENT:  Positive for facial swelling. Negative for drooling.   Respiratory:  Negative for wheezing and stridor.   Cardiovascular:  Negative for chest pain.    Physical Exam Updated Vital Signs BP (!) 149/65   Pulse (!) 59   Resp (!) 21   SpO2 99%  Physical Exam Vitals and nursing note reviewed. Exam conducted with a chaperone present.  Constitutional:      General: She is not in acute distress.    Appearance: She is well-developed.  HENT:     Head: Normocephalic and atraumatic.     Nose: Nose normal.     Mouth/Throat:     Comments: Angioedema of the tongue  Eyes:     Pupils: Pupils are equal, round, and reactive to light.  Neck:     Vascular: No carotid bruit.  Cardiovascular:     Rate and Rhythm: Normal rate and regular rhythm.     Pulses: Normal pulses.     Heart sounds: Normal heart sounds.  Pulmonary:     Effort: Pulmonary effort is normal. No respiratory distress.     Breath sounds: Normal breath sounds. No stridor. No wheezing, rhonchi or rales.  Abdominal:     General: Bowel sounds are normal. There is no distension.     Palpations: Abdomen is soft.     Tenderness: There is no abdominal tenderness. There is no guarding or rebound.  Musculoskeletal:        General: Normal range of motion.     Cervical back: Normal range of motion and neck supple. No tenderness.  Skin:    General: Skin is warm and dry.     Capillary Refill: Capillary refill takes less than 2 seconds.     Findings: No erythema or rash.  Neurological:     General: No focal deficit present.     Mental Status: She is alert and oriented to person, place, and time.     Deep Tendon Reflexes: Reflexes normal.  Psychiatric:        Mood  and Affect: Mood normal.     ED Results / Procedures / Treatments   Labs (all labs ordered are listed, but only abnormal results are displayed) Results for orders placed or performed during the hospital encounter of 02/01/23  CBC with Differential  Result Value Ref Range   WBC 9.5 4.0 - 10.5 K/uL   RBC 3.92 3.87 - 5.11 MIL/uL   Hemoglobin 12.5 12.0 - 15.0 g/dL   HCT 96.2 95.2 - 84.1 %   MCV 92.9 80.0 - 100.0 fL  MCH 31.9 26.0 - 34.0 pg   MCHC 34.3 30.0 - 36.0 g/dL   RDW 87.5 64.3 - 32.9 %   Platelets 354 150 - 400 K/uL   nRBC 0.0 0.0 - 0.2 %   Neutrophils Relative % 63 %   Neutro Abs 5.9 1.7 - 7.7 K/uL   Lymphocytes Relative 25 %   Lymphs Abs 2.4 0.7 - 4.0 K/uL   Monocytes Relative 10 %   Monocytes Absolute 0.9 0.1 - 1.0 K/uL   Eosinophils Relative 2 %   Eosinophils Absolute 0.2 0.0 - 0.5 K/uL   Basophils Relative 0 %   Basophils Absolute 0.0 0.0 - 0.1 K/uL   Immature Granulocytes 0 %   Abs Immature Granulocytes 0.04 0.00 - 0.07 K/uL  Basic metabolic panel  Result Value Ref Range   Sodium 137 135 - 145 mmol/L   Potassium 2.9 (L) 3.5 - 5.1 mmol/L   Chloride 99 98 - 111 mmol/L   CO2 28 22 - 32 mmol/L   Glucose, Bld 114 (H) 70 - 99 mg/dL   BUN 20 8 - 23 mg/dL   Creatinine, Ser 5.18 (H) 0.44 - 1.00 mg/dL   Calcium 9.3 8.9 - 84.1 mg/dL   GFR, Estimated 56 (L) >60 mL/min   Anion gap 10 5 - 15  Troponin I (High Sensitivity)  Result Value Ref Range   Troponin I (High Sensitivity) 4 <18 ng/L   No results found.  EKG  EKG Interpretation Date/Time:  Sunday February 01 2023 02:36:25 EST Ventricular Rate:  58 PR Interval:  161 QRS Duration:  90 QT Interval:  597 QTC Calculation: 582 R Axis:   73  Text Interpretation: Sinus rhythm Nonspecific repol abnormality, diffuse leads Prolonged QT interval Confirmed by Rosio Weiss ([id number]) on 02/01/2023 2:56:59 AM         Radiology No results found.  Procedures .Critical Care  Performed by: Cy Blamer,  MD Authorized by: Cy Blamer, MD   Critical care provider statement:    Critical care time (minutes):  60   Critical care was necessary to treat or prevent imminent or life-threatening deterioration of the following conditions: angioedema.   Critical care was time spent personally by me on the following activities:  Discussions with consultants, evaluation of patient's response to treatment, examination of patient, obtaining history from patient or surrogate, ordering and performing treatments and interventions, ordering and review of laboratory studies, pulse oximetry, re-evaluation of patient's condition and review of old charts   Care discussed with: admitting provider       Medications Ordered in ED Medications  famotidine (PEPCID) IVPB 20 mg premix (20 mg Intravenous New Bag/Given 02/01/23 0227)  tranexamic acid (CYKLOKAPRON) IVPB 1,000 mg (has no administration in time range)  methylPREDNISolone sodium succinate (SOLU-MEDROL) 125 mg/2 mL injection 125 mg (125 mg Intravenous Given 02/01/23 0227)  diphenhydrAMINE (BENADRYL) injection 25 mg (25 mg Intravenous Given 02/01/23 0226)  EPINEPHrine (EPI-PEN) injection 0.3 mg (0.3 mg Intramuscular Given 02/01/23 0229)  tranexamic acid (CYKLOKAPRON) IVPB 1,000 mg (0 mg Intravenous Stopped 02/01/23 0238)    ED Course/ Medical Decision Making/ A&P                                 Medical Decision Making Patient with 30 minutes of tongue angioedema   Amount and/or Complexity of Data Reviewed Independent Historian: spouse    Details: See above  External Data Reviewed: labs and notes.  Details: Previous pulmonary note and labs reviewed  Labs: ordered.    Details: Normal white count 9.5, normal hemoglobin 12.5, normal platelets. Normal sodium 137, potassium low 2.9, creatinine slight elevation 1.05, normal troponin 4 ECG/medicine tests: ordered and independent interpretation performed. Decision-making details documented in ED  Course. Discussion of management or test interpretation with external provider(s): Case d/w Dr. Daralene Milch admit to ICU prefers [facility], will assist as necessary.  Took patient's MRN Case d/w Dr. Lonzo Candy, ICU.  Transfer directly to the ICU at [facility] and he will see there.  Do not transfer to the ED.  I confirmed this with attending.    Risk Prescription drug management. Decision regarding hospitalization. Risk Details: No stridr on exam.  Most medication, slight improvement in tongue.   No worsening.  Patient is stable for transfer to be seen by ICU.  Reexamined patient as [facility] arrived tongue is approximately 50% improved.  Speaking more clearly O2 sat 100%, no stridor no drooling is stable for transfer.  This is very likely ACE-I/ARB angioedema.  Potassium ordered for hypokalemia but has not arrived at the time of transport.      Final Clinical Impression(s) / ED Diagnoses Final diagnoses:  None   The patient appears reasonably stabilized for admission considering the current resources, flow, and capabilities available in the ED at this time, and I doubt any other [facility] requiring further screening and/or treatment in the ED prior to admission.  Rx / DC Orders ED Discharge Orders     None         Ronaldo Crilly, MD 02/01/23 [phone]

## 2023-02-01 NOTE — H&P (Signed)
NAME:  Becky [name], MRN:  [id number], DOB:  1951/01/23, LOS: 0 ADMISSION DATE:  02/01/2023, CONSULTATION DATE:  12/1 REFERRING MD:  Dr. Nicanor Alcon, CHIEF COMPLAINT:  angioedema   History of Present Illness:  Patient is a 72 yo F w/ pertinent PMH asthma, CVA, HTN, HLD, hypothyroidism, CKD presents to [facility] ED on 12/1 w/ angioedema.  Patient came to [facility] ED on 12/1 for swelling in mouth/tongue. Patient is on telmisartan and norvasc. Patient afebrile and hemodynamically stable. Sats 97% on room air. Patient denies difficulty breathing. Found to have tongue swelling concerning for angioedema. Patient given epi pen, solumedrol, pepcid, benadryl and tranexamic acid. ENT consulted and recommended transfer to ICU at [facility]. PCCM to admit.  Pertinent  Medical History   Past Medical History:  Diagnosis Date   Chronic kidney disease    CVA (cerebral vascular accident) (HCC) 11/14/2013   Depression    followed by Dr Arletha Pili frequently 10/01/2015   Genital herpes    History of melanoma    followed by derm Dr Emily Filbert   Hyperlipidemia    Hypertension    MCI (mild cognitive impairment) 10/01/2015   Pure hypercholesterolemia 11/14/2013   Stroke [facility])    Old stroke seen on MRI 12/2009, carotid doppler w minimal plaque, MRI off the head otherwise neg for acute disease.   Thyroid disease    hypothyroid. Followed by Dr Evelene Croon   TIA (transient ischemic attack) 8/08   followed by neurologist Dr Pearlean Brownie     [facility] Events: Including procedures, antibiotic start and stop dates in addition to other pertinent events   12/1 admitted w/ angioedema  Interim History / Subjective:  On 2L [place] States breathing is improved  Objective   Blood pressure (!) 149/65, pulse (!) 59, resp. rate (!) 21, SpO2 99%.       No intake or output data in the 24 hours ending 02/01/23 0246 There were no vitals filed for this visit.  Examination: General: NAD HEENT: MM pink/moist; tongue swelling appreciated;  vocalizing well Neuro: Aox3; MAE; some left sided facial drooping; b/l muscle strength UE and LE CV: s1s2, RRR, no m/r/g PULM:  dim clear BS bilaterally; [place] 2L GI: soft, bsx4 active  Extremities: warm/dry, ble edema  Skin: no rashes or lesions    Resolved Hospital Problem list     Assessment & Plan:  Angioedema -is on telmisartan and norvasc with both having low risk of angioedema Plan: -cont benadryl and H2B -iv steroids -given tranexamic acid and epi -cont to monitor in icu overnight for airway swelling -ENT consulted; appreciate recs  Hx of HTN/HLD Plan: -hold anti-hypertensives/statin while npo -prn hydralazine for htn -consider dc'ing norvasc and/or telmisartan  Hx of asthma Plan: -albuterol prn -cont dulera  CKD 3a Hypokalemia Plan: -replete K -Trend BMP / urinary output -Replace electrolytes as indicated -Avoid nephrotoxic agents, ensure adequate renal perfusion  Left sided facial droop: could be due to swelling Hx of CVA Plan: -consider CT head -asa and statin when able to take po  Hx of anxiety/depression Plan: -hold home meds while npo  Hypothyroidism Plan: -resume synthroid later today  Hx of HA Plan: -hold topamax while npo  Best Practice (right click and "Reselect all SmartList Selections" daily)   Diet/type: NPO DVT prophylaxis: heparin subcu   Pressure ulcer(s): not present on admission  GI prophylaxis: H2B Lines: N/A Foley:  N/A Code Status:  full code Last date of multidisciplinary goals of care discussion [12/1 updated patient Becky [name]  at bedside]  Labs   CBC: No results for input(s): "WBC", "NEUTROABS", "HGB", "HCT", "MCV", "PLT" in the last 168 hours.  Basic Metabolic Panel: No results for input(s): "NA", "K", "CL", "CO2", "GLUCOSE", "BUN", "CREATININE", "CALCIUM", "MG", "PHOS" in the last 168 hours. GFR: CrCl cannot be calculated (Patient's most recent lab result is older than the maximum 21 days allowed.). No results  for input(s): "PROCALCITON", "WBC", "LATICACIDVEN" in the last 168 hours.  Liver Function Tests: No results for input(s): "AST", "ALT", "ALKPHOS", "BILITOT", "PROT", "ALBUMIN" in the last 168 hours. No results for input(s): "LIPASE", "AMYLASE" in the last 168 hours. No results for input(s): "AMMONIA" in the last 168 hours.  ABG    Component Value Date/Time   PHART 7.54 (H) 08/17/2021 2158   PCO2ART 30 (L) 08/17/2021 2158   PO2ART 89 08/17/2021 2158   HCO3 25.7 08/17/2021 2158   TCO2 24 11/01/2006 2104   ACIDBASEDEF 1.0 11/01/2006 2104   O2SAT 98.3 08/17/2021 2158     Coagulation Profile: No results for input(s): "INR", "PROTIME" in the last 168 hours.  Cardiac Enzymes: No results for input(s): "CKTOTAL", "CKMB", "CKMBINDEX", "TROPONINI" in the last 168 hours.  HbA1C: Hgb A1c MFr Bld  Date/Time Value Ref Range Status  11/01/2006 10:52 PM   Final   5.6 (NOTE)   The ADA recommends the following therapeutic goals for glycemic   control related to Hgb A1C measurement:   Goal of Therapy:   < 7.0% Hgb A1C   Action Suggested:  > 8.0% Hgb A1C   Ref:  Diabetes Care, 22, Suppl. 1, 1999    CBG: No results for input(s): "GLUCAP" in the last 168 hours.  Review of Systems:   Review of Systems  Constitutional:  Negative for fever.  HENT:         Tongue swelling  Respiratory:  Positive for shortness of breath. Negative for wheezing.   Cardiovascular:  Negative for chest pain.  Gastrointestinal:  Negative for abdominal pain, nausea and vomiting.     Past Medical History:  She,  has a past medical history of Chronic kidney disease, CVA (cerebral vascular accident) (HCC) (11/14/2013), Depression, Falls frequently (10/01/2015), Genital herpes, History of melanoma, Hyperlipidemia, Hypertension, MCI (mild cognitive impairment) (10/01/2015), Pure hypercholesterolemia (11/14/2013), Stroke [facility]), Thyroid disease, and TIA (transient ischemic attack) (8/08).   Surgical History:   Past Surgical  History:  Procedure Laterality Date   ABDOMINAL HYSTERECTOMY     APPENDECTOMY     CHOLECYSTECTOMY     MELANOMA EXCISION     x2     Social History:   reports that she has never smoked. She has never used smokeless tobacco. She reports that she does not drink alcohol and does not use drugs.   Family History:  Her family history includes Breast cancer in her mother; Dementia in her maternal aunt; Heart attack in her father; Hyperlipidemia in her father and sister; Hypertension in her father and sister.   Allergies Allergies  Allergen Reactions   Penicillins Anaphylaxis   Iodinated Contrast Media Other (See Comments)   Iohexol Other (See Comments)     Desc: iodine    Nsaids Other (See Comments)    CKD      Home Medications  Prior to Admission medications   Medication Sig Start Date End Date Taking? Authorizing Provider  albuterol (ACCUNEB) 0.63 MG/3ML nebulizer solution Take 3 mLs (0.63 mg total) by nebulization every 6 (six) hours as needed for wheezing. 05/23/21   Luciano Cutter, MD  albuterol (PROVENTIL) (2.5 MG/3ML) 0.083% nebulizer solution USE 1 VIAL IN NEBULIZER EVERY 6 HOURS AS NEEDED FOR SHORTNESS OF BREATH AND FOR WHEEZING 11/10/22   Luciano Cutter, MD  albuterol (VENTOLIN HFA) 108 (90 Base) MCG/ACT inhaler Inhale 1 puff into the lungs every 4 (four) hours as needed for wheezing. 12/31/20   [provider]  ALPRAZolam Prudy Feeler) 0.25 MG tablet Take 0.25 mg by mouth 3 (three) times daily as needed for anxiety or sleep. 08/10/15   [provider]  amLODipine (NORVASC) 5 MG tablet Take 5 mg by mouth daily.  12/24/16   [provider]  ARIPiprazole (ABILIFY) 5 MG tablet Take 5 mg by mouth daily. 01/23/21   [provider]  aspirin EC 325 MG tablet Take 325 mg by mouth 2 (two) times daily.     [provider]  azithromycin (ZITHROMAX Z-PAK) 250 MG tablet Take 2 tablets day 1 and then 1 daily for 4 days 01/08/23   Virl Diamond A, MD   desvenlafaxine (PRISTIQ) 100 MG 24 hr tablet Take 100 mg by mouth daily. 09/13/19   [provider]  Dexmethylphenidate HCl 40 MG CP24 Take 40 mg daily by mouth. 01/08/17   [provider]  fluticasone-salmeterol (ADVAIR DISKUS) 500-50 MCG/ACT AEPB Inhale 1 puff into the lungs in the morning and at bedtime. 04/25/22   Luciano Cutter, MD  hydrochlorothiazide (HYDRODIURIL) 25 MG tablet Take 25 mg by mouth every morning. 08/01/21   [provider]  levothyroxine (SYNTHROID, LEVOTHROID) 25 MCG tablet Take 25 mcg by mouth daily before breakfast.    [provider]  nadolol (CORGARD) 20 MG tablet Take 1 tablet (20 mg total) by mouth daily. Patient needs appt for # 90 supply or future refills. Please call office to schedule appt. 07/16/22   Camnitz, Andree Coss, MD  omeprazole (PRILOSEC) 40 MG capsule Take 1 capsule (40 mg total) by mouth every morning. 08/15/21   Luciano Cutter, MD  pantoprazole (PROTONIX) 40 MG tablet Take 40 mg by mouth 2 (two) times daily. 08/01/21   [provider]  potassium chloride (KLOR-CON M) 10 MEQ tablet Take 2 tablets (20 mEq total) by mouth daily. 08/20/21   Alwyn Ren, MD  pramipexole (MIRAPEX) 0.125 MG tablet Take 0.125 mg by mouth at bedtime. 12/26/15   [provider]  pravastatin (PRAVACHOL) 40 MG tablet Take 40 mg by mouth daily. 08/01/21   [provider]  predniSONE (DELTASONE) 10 MG tablet Take 10 mg by mouth 2 (two) times daily. 01/05/23   [provider]  telmisartan (MICARDIS) 80 MG tablet Take 80 mg by mouth daily. 01/01/21   [provider]  topiramate (TOPAMAX) 25 MG tablet Take 1 tablet (25 mg total) by mouth 2 (two) times daily. 01/29/22   Micki Riley, MD     Critical care time: 45 minutes    JD Anselm [facility] 02/01/2023, 2:46 AM  Please see [email or web address] for pager details.  From 7A-7P if no response, please call [phone]. After  hours, please call ELink [phone].

## 2023-02-02 LAB — MAGNESIUM: Magnesium: 2.3 mg/dL (ref 1.7–2.4)

## 2023-02-02 LAB — BASIC METABOLIC PANEL
Anion gap: 9 (ref 5–15)
BUN: 23 mg/dL (ref 8–23)
CO2: 26 mmol/L (ref 22–32)
Calcium: 9.1 mg/dL (ref 8.9–10.3)
Chloride: 103 mmol/L (ref 98–111)
Creatinine, Ser: 1.46 mg/dL — ABNORMAL HIGH (ref 0.44–1.00)
GFR, Estimated: 38 mL/min — ABNORMAL LOW (ref >60)
Glucose, Bld: 138 mg/dL — ABNORMAL HIGH (ref 70–99)
Potassium: 3.7 mmol/L (ref 3.5–5.1)
Sodium: 138 mmol/L (ref 135–145)

## 2023-02-02 LAB — CBC
HCT: 37.2 % (ref 36.0–46.0)
Hemoglobin: 12.3 g/dL (ref 12.0–15.0)
MCH: 30.5 pg (ref 26.0–34.0)
MCHC: 33.1 g/dL (ref 30.0–36.0)
MCV: 92.3 fL (ref 80.0–100.0)
Platelets: 343 10*3/uL (ref 150–400)
RBC: 4.03 MIL/uL (ref 3.87–5.11)
RDW: 12.9 % (ref 11.5–15.5)
WBC: 16.2 10*3/uL — ABNORMAL HIGH (ref 4.0–10.5)
nRBC: 0 % (ref 0.0–0.2)

## 2023-02-02 LAB — PHOSPHORUS: Phosphorus: 3.3 mg/dL (ref 2.5–4.6)

## 2023-02-02 LAB — GLUCOSE, CAPILLARY
Glucose-Capillary: 143 mg/dL — ABNORMAL HIGH (ref 70–99)
Glucose-Capillary: 114 mg/dL — ABNORMAL HIGH (ref 70–99)

## 2023-02-02 MED ORDER — POTASSIUM CHLORIDE 10 MEQ/100ML IV SOLN
10.0000 meq | INTRAVENOUS | Status: DC
  Administered 2023-02-02: 10 meq via INTRAVENOUS
  Filled 2023-02-02: qty 100

## 2023-02-02 MED ORDER — POTASSIUM CHLORIDE CRYS ER 20 MEQ PO TBCR
40.0000 meq | EXTENDED_RELEASE_TABLET | Freq: Once | ORAL | Status: DC
  Filled 2023-02-02: qty 2

## 2023-02-02 MED ORDER — LEVOTHYROXINE SODIUM 50 MCG PO TABS
50.0000 ug | ORAL_TABLET | Freq: Every day | ORAL | Status: DC
  Administered 2023-02-03 – 2023-02-04 (×2): 50 ug via ORAL
  Filled 2023-02-02 (×2): qty 1

## 2023-02-02 MED ORDER — AMLODIPINE BESYLATE 10 MG PO TABS
10.0000 mg | ORAL_TABLET | Freq: Every day | ORAL | Status: DC
  Administered 2023-02-02 – 2023-02-04 (×3): 10 mg via ORAL
  Filled 2023-02-02 (×3): qty 1

## 2023-02-02 MED ORDER — ASPIRIN 325 MG PO TBEC
325.0000 mg | DELAYED_RELEASE_TABLET | Freq: Two times a day (BID) | ORAL | Status: DC
  Administered 2023-02-02 – 2023-02-04 (×4): 325 mg via ORAL
  Filled 2023-02-02 (×5): qty 1

## 2023-02-02 NOTE — Progress Notes (Signed)
[facility] ADULT ICU REPLACEMENT PROTOCOL   The patient does apply for the [facility] Adult ICU Electrolyte Replacment Protocol based on the criteria listed below:   1.Exclusion criteria: TCTS, ECMO, Dialysis, and Myasthenia Gravis patients 2. Is GFR >/= 30 ml/min? Yes.    Patient's GFR today is 38 3. Is SCr </= 2? Yes.   Patient's SCr is 1.46 mg/dL 4. Did SCr increase >/= 0.5 in 24 hours? No. 5.Pt's weight >40kg  Yes.   6. Abnormal electrolyte(s): K  7. Electrolytes replaced per protocol 8.  Call MD STAT for K+ </= 2.5, Phos </= 1, or Mag </= 1 Physician:  Azucena Freed [facility] 02/02/2023 5:16 AM

## 2023-02-02 NOTE — Progress Notes (Addendum)
NAME:  Becky [name], MRN:  [id number], DOB:  [birth year], LOS: 1 ADMISSION DATE:  02/01/2023, CONSULTATION DATE:  12/1 REFERRING MD:  Dr. Nicanor [name], CHIEF COMPLAINT:  angioedema   History of Present Illness:  Patient is a 72 yo F w/ pertinent PMH asthma, CVA, HTN, HLD, hypothyroidism, CKD presents to [facility] ED on 12/1 w/ angioedema.  Patient came to [facility] ED on 12/1 for swelling in mouth/tongue. Patient is on telmisartan and norvasc. Patient afebrile and hemodynamically stable. Sats 97% on room air. Patient denies difficulty breathing. Found to have tongue swelling concerning for angioedema. Patient given epi pen, solumedrol, pepcid, benadryl and tranexamic acid. ENT consulted and recommended transfer to ICU at [facility]. PCCM to admit.  Pertinent  Medical History   Past Medical History:  Diagnosis Date   Chronic kidney disease    CVA (cerebral vascular accident) (HCC) 11/14/2013   Depression    followed by Dr Becky [name] frequently 10/01/2015   Genital herpes    History of melanoma    followed by derm Dr Becky [name]   Hyperlipidemia    Hypertension    MCI (mild cognitive impairment) 10/01/2015   Pure hypercholesterolemia 11/14/2013   Stroke [facility])    Old stroke seen on MRI 12/2009, carotid doppler w minimal plaque, MRI off the head otherwise neg for acute disease.   Thyroid disease    hypothyroid. Followed by Dr Becky [name]   TIA (transient ischemic attack) 8/08   followed by neurologist Dr Becky [name]     [facility] Events: Including procedures, antibiotic start and stop dates in addition to other pertinent events   12/1 admitted w/ angioedema  Interim History / Subjective:  Pt doing well, no concerns/complaints today. Significant other updated at bedside.   Objective   Blood pressure (!) 153/67, pulse 82, temperature 97.9 F (36.6 C), temperature source Oral, resp. rate (!) 27, weight 96 kg, SpO2 96%.        Intake/Output Summary (Last 24 hours) at 02/02/2023 1027 Last data filed at  02/02/2023 0800 Gross per 24 hour  Intake 499.92 ml  Output 1400 ml  Net -900.08 ml   Filed Weights   02/01/23 0703 02/02/23 0500  Weight: 96.6 kg 96 kg    Examination: General: NAD, pleasant HEENT: No oral/tongue edema, MMM Neuro: A&O, no focal deficits CV: RRR, normal S1/S2, no murmur PULM:  CTAB anteriorly, normal WOB on RA GI: soft, non tender, non distended  Extremities: trace pitting edema of BLEs Skin: warm and dry, no rash   Resolved Hospital Problem list     Assessment & Plan:  Angioedema -Previously on telmisartan and norvasc with both having low risk of angioedema - s/p benadryl, pepcid, epi, IV TXA, and IV solumedrol  - ENT saw on 12/1: rec supportive care/observation - doing well today, edema resolved  Plan: - Observed Norvasc challenge today  - Will not reintroduce telmisartin as it is most likely culprit  - Transfer to floor today   Hx of HTN/HLD Plan: -dc telmisartan -restart home Norvasc today -Plan to restart home nadolol tomorrow -cont home hydrochlorothiazide   Hx of asthma Plan: -albuterol prn -cont dulera  CKD 3a Hypokalemia Plan: -K 3.7 this morning (s/p repletion yesterday) -Trend BMP / urinary output -Replace electrolytes as indicated -Avoid nephrotoxic agents, ensure adequate renal perfusion  Left sided facial droop: was likely due to swelling Hx of CVA - CTA head/neck - generalized intracranial atherosclerosis w/ moderate R PCA stenosis Plan: -continue home asa and statin  Hx of anxiety/depression/ADHD Plan: -continue home meds: abilify, xanax prn, focalin, trazodone at bedtime  Hypothyroidism Plan: -continue home synthroid   Recent cataract surgery - resume home eye drops  Best Practice (right click and "Reselect all SmartList Selections" daily)   Diet/type: Regular consistency (see orders) DVT prophylaxis: heparin subcu   Pressure ulcer(s): not present on admission  GI prophylaxis: N/A Lines: N/A Foley:   N/A Code Status:  full code Last date of multidisciplinary goals of care discussion [12/1 updated patient Becky [name] at bedside]  Labs   CBC: Recent Labs  Lab 02/01/23 0225 02/01/23 0810  WBC 9.5 10.0  NEUTROABS 5.9  --   HGB 12.5 11.8*  HCT 36.4 35.2*  MCV 92.9 91.9  PLT 354 316    Basic Metabolic Panel: Recent Labs  Lab 02/01/23 0225 02/01/23 0810 02/02/23 0330  NA 137 136 138  K 2.9* 4.0 3.7  CL 99 103 103  CO2 28 23 26   GLUCOSE 114* 150* 138*  BUN 20 20 23   CREATININE 1.05* 1.17* 1.46*  CALCIUM 9.3 9.0 9.1  MG  --  1.9 2.3  PHOS  --   --  3.3   GFR: Estimated Creatinine Clearance: 36.1 mL/min (A) (by C-G formula based on SCr of 1.46 mg/dL (H)). Recent Labs  Lab 02/01/23 0225 02/01/23 0810  WBC 9.5 10.0    Liver Function Tests: Recent Labs  Lab 02/01/23 0810  AST 20  ALT 22  ALKPHOS 70  BILITOT 0.6  PROT 6.7  ALBUMIN 3.7   No results for input(s): "LIPASE", "AMYLASE" in the last 168 hours. No results for input(s): "AMMONIA" in the last 168 hours.  ABG    Component Value Date/Time   PHART 7.54 (H) 08/17/2021 2158   PCO2ART 30 (L) 08/17/2021 2158   PO2ART 89 08/17/2021 2158   HCO3 25.7 08/17/2021 2158   TCO2 24 11/01/2006 2104   ACIDBASEDEF 1.0 11/01/2006 2104   O2SAT 98.3 08/17/2021 2158     Coagulation Profile: No results for input(s): "INR", "PROTIME" in the last 168 hours.  Cardiac Enzymes: No results for input(s): "CKTOTAL", "CKMB", "CKMBINDEX", "TROPONINI" in the last 168 hours.  HbA1C: Hgb A1c MFr Bld  Date/Time Value Ref Range Status  11/01/2006 10:52 PM   Final   5.6 (NOTE)   The ADA recommends the following therapeutic goals for glycemic   control related to Hgb A1C measurement:   Goal of Therapy:   < 7.0% Hgb A1C   Action Suggested:  > 8.0% Hgb A1C   Ref:  Diabetes Care, 22, Suppl. 1, 1999    CBG: Recent Labs  Lab 02/01/23 1514 02/01/23 1928 02/01/23 2335 02/02/23 0308 02/02/23 0719  GLUCAP 174* 197* 150* 143* 114*        Critical care time:      Becky Alley, DO Family Medicine, PGY-3 02/02/2023 11:05 AM   See Becky [name] for personal pager PCCM on call pager [phone] until 7pm. Please call Elink 7p-7a. [phone]

## 2023-02-02 NOTE — Plan of Care (Signed)
  Problem: Education: Goal: Knowledge of General Education information will improve Description: Including pain rating scale, medication(s)/side effects and non-pharmacologic comfort measures Outcome: Progressing   Problem: Health Behavior/Discharge Planning: Goal: Ability to manage health-related needs will improve Outcome: Progressing   Problem: Clinical Measurements: Goal: Ability to maintain clinical measurements within normal limits will improve Outcome: Progressing Goal: Will remain free from infection Outcome: Progressing Goal: Diagnostic test results will improve Outcome: Progressing Goal: Respiratory complications will improve Outcome: Progressing Goal: Cardiovascular complication will be avoided Outcome: Progressing   Problem: Activity: Goal: Risk for activity intolerance will decrease Outcome: Progressing   Problem: Nutrition: Goal: Adequate nutrition will be maintained Outcome: Progressing   Problem: Coping: Goal: Level of anxiety will decrease Outcome: Progressing   Problem: Pain Management: Goal: General experience of comfort will improve Outcome: Progressing   Problem: Safety: Goal: Ability to remain free from injury will improve Outcome: Progressing   Problem: Skin Integrity: Goal: Risk for impaired skin integrity will decrease Outcome: Progressing

## 2023-02-03 DIAGNOSIS — T783XXA Angioneurotic edema, initial encounter: Secondary | ICD-10-CM | POA: Diagnosis not present

## 2023-02-03 DIAGNOSIS — D72829 Elevated white blood cell count, unspecified: Secondary | ICD-10-CM

## 2023-02-03 LAB — BASIC METABOLIC PANEL
Anion gap: 12 (ref 5–15)
BUN: 24 mg/dL — ABNORMAL HIGH (ref 8–23)
CO2: 24 mmol/L (ref 22–32)
Calcium: 9 mg/dL (ref 8.9–10.3)
Chloride: 101 mmol/L (ref 98–111)
Creatinine, Ser: 1 mg/dL (ref 0.44–1.00)
GFR, Estimated: 60 mL/min — ABNORMAL LOW (ref >60)
Glucose, Bld: 87 mg/dL (ref 70–99)
Potassium: 3.7 mmol/L (ref 3.5–5.1)
Sodium: 137 mmol/L (ref 135–145)

## 2023-02-03 LAB — CBC WITH DIFFERENTIAL/PLATELET
Abs Immature Granulocytes: 0.11 10*3/uL — ABNORMAL HIGH (ref 0.00–0.07)
Basophils Absolute: 0.1 10*3/uL (ref 0.0–0.1)
Basophils Relative: 0 %
Eosinophils Absolute: 0.1 10*3/uL (ref 0.0–0.5)
Eosinophils Relative: 1 %
HCT: 36.7 % (ref 36.0–46.0)
Hemoglobin: 12.8 g/dL (ref 12.0–15.0)
Immature Granulocytes: 1 %
Lymphocytes Relative: 16 %
Lymphs Abs: 1.9 10*3/uL (ref 0.7–4.0)
MCH: 30.9 pg (ref 26.0–34.0)
MCHC: 34.9 g/dL (ref 30.0–36.0)
MCV: 88.6 fL (ref 80.0–100.0)
Monocytes Absolute: 1.1 10*3/uL — ABNORMAL HIGH (ref 0.1–1.0)
Monocytes Relative: 9 %
Neutro Abs: 8.6 10*3/uL — ABNORMAL HIGH (ref 1.7–7.7)
Neutrophils Relative %: 73 %
Platelets: 345 10*3/uL (ref 150–400)
RBC: 4.14 MIL/uL (ref 3.87–5.11)
RDW: 12.8 % (ref 11.5–15.5)
WBC: 11.9 10*3/uL — ABNORMAL HIGH (ref 4.0–10.5)
nRBC: 0 % (ref 0.0–0.2)

## 2023-02-03 LAB — HEPATIC FUNCTION PANEL
ALT: 25 U/L (ref 0–44)
AST: 24 U/L (ref 15–41)
Albumin: 4.1 g/dL (ref 3.5–5.0)
Alkaline Phosphatase: 75 U/L (ref 38–126)
Bilirubin, Direct: 0.1 mg/dL (ref 0.0–0.2)
Indirect Bilirubin: 0.5 mg/dL (ref 0.3–0.9)
Total Bilirubin: 0.6 mg/dL (ref <1.2)
Total Protein: 7.4 g/dL (ref 6.5–8.1)

## 2023-02-03 LAB — PHOSPHORUS: Phosphorus: 3.2 mg/dL (ref 2.5–4.6)

## 2023-02-03 LAB — MAGNESIUM: Magnesium: 2 mg/dL (ref 1.7–2.4)

## 2023-02-03 MED ORDER — NADOLOL 20 MG PO TABS
20.0000 mg | ORAL_TABLET | Freq: Every day | ORAL | Status: DC
  Administered 2023-02-03 – 2023-02-04 (×2): 20 mg via ORAL
  Filled 2023-02-03 (×2): qty 1

## 2023-02-03 MED ORDER — PROCHLORPERAZINE EDISYLATE 10 MG/2ML IJ SOLN
10.0000 mg | Freq: Once | INTRAMUSCULAR | Status: AC
  Administered 2023-02-03: 10 mg via INTRAVENOUS
  Filled 2023-02-03: qty 2

## 2023-02-03 NOTE — Progress Notes (Signed)
eLink Physician-Brief Progress Note Patient Name: Becky [name] DOB: [birth year] MRN: [id number]   Date of Service  02/03/2023  HPI/Events of Note  Presented with angioedema from an ARB.  Has chronic nausea previously treated with a scopolamine patch.  Has prolonged QT syndrome and is chronically been intolerant of majority of antiemetics.  QTc 582.  eICU Interventions  Compazine x 1.  Could also try low-dose benzos if ineffective.     Intervention Category Minor Interventions: Routine modifications to care plan (e.g. PRN medications for pain, fever)  Sundy Houchins 02/03/2023, 4:51 AM

## 2023-02-03 NOTE — Progress Notes (Signed)
PROGRESS NOTE    Becky [name]  IRS:[id number] DOB: 06/23/1950 DOA: 02/01/2023 PCP: Daisy Floro, MD   Brief Narrative:  The patient is a 72 year old morbidly obese Caucasian female with a past medical history significant for but not limited to history of asthma, CVA, hypertension, hyperlipidemia, hypothyroidism, CKD stage IIIa, as well as other comorbidities who presented to the [facility] ED with face and tongue swelling and presented with angioedema.  On 02/01/2023 she presented with swelling in her mouth and tongue to the [facility] ED.  She is noted to be on telmisartan and amlodipine and at that time she is afebrile and hemodynamically stable with saturations of 97% on room air.  She did not notice any difficulty breathing but she was found to have significant tongue swelling concerning for angioedema and given EpiPen, Solu-Medrol, Pepcid, Benadryl and tranexamic acid.  ENT was consulted and recommended transferring the patient to the ICU at [facility] and PCCM admitted she is currently being treated for angioedema and improving and was transferred to the [facility] service on 02/03/2023.   Current plan was to resume nadolol and observe overnight to see how she does and continue to hold telmisartan.  PT OT evaluated and recommending no follow-up.  If she does well anticipate discharging home in next 24 hours  Assessment and Plan:  Angioedema -Previously on telmisartan and norvasc with both having low risk of angioedema -s/p benadryl, pepcid, epi, IV TXA, and IV solumedrol  -IV Solu-Medrol is likely the cause of her leukocytosis WBC trend is: Recent Labs  Lab 02/01/23 0225 02/01/23 0810 02/02/23 1019 02/03/23 1356  WBC 9.5 10.0 16.2* 11.9*   ENT saw on 12/1: rec supportive care/observation -Doing well today, edema improved  -Observed Norvasc challenge yesterday with resumption of Nadolol today  -Will not reintroduce telmisartin as it is most likely culprit  -Transferred to floor  yesterday and now on [facility] service; Will monitor overnight and if doing well can D/C home -PT/OT recommending no follow up   Hypertension -Disc to telmisartan altogether -Resumed home amlodipine 10 mg p.o. daily -Further plan per PCCM resumed home nadolol today 2 mg p.o. daily and will observe overnight -Continue home hydrochlorothiazide 25 mg p.o. every morning -Continue monitor blood pressures per protocol -Last blood pressure reading was 150/89   Hx of Asthma -Albuterol nebs 2.5 mg every 4 as needed for wheezing or shortness of breath as well as continuing Dulera 2 puffs IH twice daily   CKD 3a -BUN/Cr Trend: Recent Labs  Lab 02/01/23 0225 02/01/23 0810 02/02/23 0330 02/03/23 0605  BUN 20 20 23  24*  CREATININE 1.05* 1.17* 1.46* 1.00  -Avoid Nephrotoxic Medications, Contrast Dyes, Hypotension and Dehydration to Ensure Adequate Renal Perfusion and will need to Renally Adjust Meds -Continue to monitor urinary output and replace electrolytes as needed -Continue to Monitor and Trend Renal Function carefully and repeat CMP in the AM   Hypokalemia -Patient's K+ Level Trend: Recent Labs  Lab 02/01/23 0225 02/01/23 0810 02/02/23 0330 02/03/23 0605  K 2.9* 4.0 3.7 3.7  -Continue to Monitor and Replete as Necessary -Repeat CMP in the AM   Left sided facial droop -Was likely due to swelling -Has had a Hx of CVA  Hx of CVA  -CTA head/neck - generalized intracranial atherosclerosis w/ moderate R PCA stenosis -C/w ASA 325 mg po BID and Pravastatin 40 mg po Daily     Anxiety/Depression/ADHD -Continue home meds:  -Continue with alprazolam 0.25 mg p.o. 3 times daily as needed anxiety  and sleep, aripiprazole 5 mg p.o. daily, dexmethylphenidate 5 mg p.o. daily, trazodone 100 mg p.o. nightly   Hypothyroidism -Continue home Levothyroxine 50 mcg po Daily    Recent cataract surgery -Resume home eye drops with Pred Forte, Vigamox and Acular 1 drop in the right eye daily  Class III  Obesity -Complicates overall prognosis and care -Estimated body mass index is 40.69 kg/m as calculated from the following:   Height as of this encounter: 5' (1.524 m).   Weight as of this encounter: 94.5 kg.  -Weight Loss and Dietary Counseling given   DVT prophylaxis: heparin injection 5,000 Units Start: 02/01/23 1400    Code Status: Full Code Family Communication: No family present at bedside   Disposition Plan:  Level of care: Med-Surg Status is: Inpatient Remains inpatient appropriate because: Will observe again overnight after resuming her last antihypertensive and anticipating discharging in the next 24 hours if stable   Consultants:  PCCM transfer ENT  Procedures:  As delineated as above  Antimicrobials:  Anti-infectives (From admission, onward)    None       Subjective: Seen and examined at bedside thinks her tongue swelling is doing better.  No nausea or vomiting.  Feels okay.  No other concerns or complaints at this time he denies any shortness of breath  Objective: Vitals:   02/02/23 2053 02/03/23 0426 02/03/23 0719 02/03/23 0749  BP: (!) 156/78 (!) 157/89  (!) 150/89  Pulse: 87 85  73  Resp: 18 18    Temp: 98.3 F (36.8 C) 98.4 F (36.9 C)  97.7 F (36.5 C)  TempSrc: Oral Oral  Oral  SpO2: 98% 100% 93% 95%  Weight:  94.5 kg    Height:       No intake or output data in the 24 hours ending 02/03/23 1657 Filed Weights   02/01/23 0703 02/02/23 0500 02/03/23 0426  Weight: 96.6 kg 96 kg 94.5 kg   Examination: Physical Exam:  Constitutional: WN/WD morbidly obese Caucasian female in no acute distress Respiratory: Diminished to auscultation bilaterally, no wheezing, rales, rhonchi or crackles. Normal respiratory effort and patient is not tachypenic. No accessory muscle use.  Unlabored breathing Cardiovascular: RRR, no murmurs / rubs / gallops. S1 and S2 auscultated. No extremity edema Abdomen: Soft, non-tender, distended secondary to body habitus. Bowel  sounds positive.  GU: Deferred. Musculoskeletal: No clubbing / cyanosis of digits/nails. No joint deformity upper and lower extremities.  Skin: No rashes, lesions, ulcers on a limited skin evaluation. No induration; Warm and dry.  Neurologic: CN 2-12 grossly intact with no focal deficits. Romberg sign and cerebellar reflexes not assessed.  Psychiatric: Normal judgment and insight. Alert and oriented x 3. Normal mood and appropriate affect.   Data Reviewed: I have personally reviewed following labs and imaging studies  CBC: Recent Labs  Lab 02/01/23 0225 02/01/23 0810 02/02/23 1019 02/03/23 1356  WBC 9.5 10.0 16.2* 11.9*  NEUTROABS 5.9  --   --  8.6*  HGB 12.5 11.8* 12.3 12.8  HCT 36.4 35.2* 37.2 36.7  MCV 92.9 91.9 92.3 88.6  PLT 354 316 343 345   Basic Metabolic Panel: Recent Labs  Lab 02/01/23 0225 02/01/23 0810 02/02/23 0330 02/03/23 0605 02/03/23 1147  NA 137 136 138 137  --   K 2.9* 4.0 3.7 3.7  --   CL 99 103 103 101  --   CO2 28 23 26 24   --   GLUCOSE 114* 150* 138* 87  --   BUN 20  20 23 24*  --   CREATININE 1.05* 1.17* 1.46* 1.00  --   CALCIUM 9.3 9.0 9.1 9.0  --   MG  --  1.9 2.3  --  2.0  PHOS  --   --  3.3  --  3.2   GFR: Estimated Creatinine Clearance: 52.3 mL/min (by C-G formula based on SCr of 1 mg/dL). Liver Function Tests: Recent Labs  Lab 02/01/23 0810 02/03/23 1147  AST 20 24  ALT 22 25  ALKPHOS 70 75  BILITOT 0.6 0.6  PROT 6.7 7.4  ALBUMIN 3.7 4.1   No results for input(s): "LIPASE", "AMYLASE" in the last 168 hours. No results for input(s): "AMMONIA" in the last 168 hours. Coagulation Profile: No results for input(s): "INR", "PROTIME" in the last 168 hours. Cardiac Enzymes: No results for input(s): "CKTOTAL", "CKMB", "CKMBINDEX", "TROPONINI" in the last 168 hours. BNP (last 3 results) No results for input(s): "PROBNP" in the last 8760 hours. HbA1C: No results for input(s): "HGBA1C" in the last 72 hours. CBG: Recent Labs  Lab  02/01/23 1514 02/01/23 1928 02/01/23 2335 02/02/23 0308 02/02/23 0719  GLUCAP 174* 197* 150* 143* 114*   Lipid Profile: No results for input(s): "CHOL", "HDL", "LDLCALC", "TRIG", "CHOLHDL", "LDLDIRECT" in the last 72 hours. Thyroid Function Tests: Recent Labs    02/01/23 0810  TSH 1.052  FREET4 0.87   Anemia Panel: No results for input(s): "VITAMINB12", "FOLATE", "FERRITIN", "TIBC", "IRON", "RETICCTPCT" in the last 72 hours. Sepsis Labs: No results for input(s): "PROCALCITON", "LATICACIDVEN" in the last 168 hours.  Recent Results (from the past 240 hour(s))  MRSA Next Gen by PCR, Nasal     Status: None   Collection Time: 02/01/23  4:05 AM   Specimen: Nasal Mucosa; Nasal Swab  Result Value Ref Range Status   MRSA by PCR Next Gen NOT DETECTED NOT DETECTED Final    Comment: (NOTE) The GeneXpert MRSA Assay (FDA approved for NASAL specimens only), is one component of a comprehensive MRSA colonization surveillance program. It is not intended to diagnose MRSA infection nor to guide or monitor treatment for MRSA infections. Test performance is not FDA approved in patients less than 13 years old. Performed at [facility], [address] [place], [place] [address]     Radiology Studies: No results found.  Scheduled Meds:  amLODipine  10 mg Oral Daily   ARIPiprazole  5 mg Oral Daily   aspirin EC  325 mg Oral BID   Chlorhexidine Gluconate Cloth  6 each Topical Q2000   dexmethylphenidate  40 mg Oral Daily   heparin  5,000 Units Subcutaneous Q8H   hydrochlorothiazide  25 mg Oral q morning   prednisoLONE acetate  1 drop Right Eye Daily   And   moxifloxacin  1 drop Right Eye Daily   And   ketorolac  1 drop Right Eye Daily   levothyroxine  50 mcg Oral QAC breakfast   mometasone-formoterol  2 puff Inhalation BID   nadolol  20 mg Oral Daily   pramipexole  0.125 mg Oral QHS   pravastatin  40 mg Oral Daily   traZODone  100 mg Oral QHS   Continuous Infusions:   LOS: 2  days   Marguerita Merles, DO [facility] Available via Epic secure chat 7am-7pm After these hours, please refer to coverage provider listed on [email or web address] 02/03/2023, 4:57 PM

## 2023-02-03 NOTE — Progress Notes (Signed)
Mobility Specialist Progress Note:    02/03/23 1531  Mobility  Activity Ambulated independently to bathroom;Ambulated independently in hallway  Level of Assistance Standby assist, set-up cues, supervision of patient - no hands on  Assistive Device None  Distance Ambulated (ft) 250 ft  Activity Response Tolerated well  Mobility Referral Yes  $Mobility charge 1 Mobility  Mobility Specialist Start Time (ACUTE ONLY) 1531  Mobility Specialist Stop Time (ACUTE ONLY) 1539  Mobility Specialist Time Calculation (min) (ACUTE ONLY) 8 min   Pt received in in bed, agreeable to mobility session. Independently ambulated to bathroom, void successful. Ambulated in hallway with SBA. Tolerated well, slight audible SOB throughout. Unable to get accurate pulse ox reading. Returned pt back to room, denies dizziness or SOB. Left with all needs met, husband in room, and call bell in reach.    Feliciana Rossetti Mobility Specialist Please contact via Special educational needs teacher or  Rehab office at [phone]

## 2023-02-03 NOTE — Hospital Course (Signed)
The patient is a 72 year old morbidly obese Caucasian female with a past medical history significant for but not limited to history of asthma, CVA, hypertension, hyperlipidemia, hypothyroidism, CKD stage IIIa, as well as other comorbidities who presented to the [facility] ED with face and tongue swelling and presented with angioedema.  On 02/01/2023 she presented with swelling in her mouth and tongue to the [facility] ED.  She is noted to be on telmisartan and amlodipine and at that time she is afebrile and hemodynamically stable with saturations of 97% on room air.  She did not notice any difficulty breathing but she was found to have significant tongue swelling concerning for angioedema and given EpiPen, Solu-Medrol, Pepcid, Benadryl and tranexamic acid.  ENT was consulted and recommended transferring the patient to the ICU at [facility] and PCCM admitted she is currently being treated for angioedema and improving and was transferred to the [facility] service on 02/03/2023.   Current plan was to resume nadolol and observe overnight to see how she does and continue to hold telmisartan.  PT OT evaluated and recommending no follow-up.  If she does well anticipate discharging home in next 24 hours  Assessment and Plan:  Angioedema -Previously on telmisartan and norvasc with both having low risk of angioedema -s/p benadryl, pepcid, epi, IV TXA, and IV solumedrol  -IV Solu-Medrol is likely the cause of her leukocytosis WBC trend is: Recent Labs  Lab 02/01/23 0225 02/01/23 0810 02/02/23 1019 02/03/23 1356  WBC 9.5 10.0 16.2* 11.9*   ENT saw on 12/1: rec supportive care/observation -Doing well today, edema improved  -Observed Norvasc challenge yesterday with resumption of Nadolol today  -Will not reintroduce telmisartin as it is most likely culprit  -Transferred to floor yesterday and now on [facility] service; Will monitor overnight and if doing well can D/C home -PT/OT recommending no follow up   Hypertension -Disc  to telmisartan altogether -Resumed home amlodipine 10 mg p.o. daily -Further plan per PCCM resumed home nadolol today 2 mg p.o. daily and will observe overnight -Continue home hydrochlorothiazide 25 mg p.o. every morning -Continue monitor blood pressures per protocol -Last blood pressure reading was 150/89   Hx of Asthma -Albuterol nebs 2.5 mg every 4 as needed for wheezing or shortness of breath as well as continuing Dulera 2 puffs IH twice daily   CKD 3a -BUN/Cr Trend: Recent Labs  Lab 02/01/23 0225 02/01/23 0810 02/02/23 0330 02/03/23 0605  BUN 20 20 23  24*  CREATININE 1.05* 1.17* 1.46* 1.00  -Avoid Nephrotoxic Medications, Contrast Dyes, Hypotension and Dehydration to Ensure Adequate Renal Perfusion and will need to Renally Adjust Meds -Continue to monitor urinary output and replace electrolytes as needed -Continue to Monitor and Trend Renal Function carefully and repeat CMP in the AM   Hypokalemia -Patient's K+ Level Trend: Recent Labs  Lab 02/01/23 0225 02/01/23 0810 02/02/23 0330 02/03/23 0605  K 2.9* 4.0 3.7 3.7  -Continue to Monitor and Replete as Necessary -Repeat CMP in the AM   Left sided facial droop -Was likely due to swelling -Has had a Hx of CVA  Hx of CVA  -CTA head/neck - generalized intracranial atherosclerosis w/ moderate R PCA stenosis -C/w ASA 325 mg po BID and Pravastatin 40 mg po Daily     Anxiety/Depression/ADHD -Continue home meds:  -Continue with alprazolam 0.25 mg p.o. 3 times daily as needed anxiety and sleep, aripiprazole 5 mg p.o. daily, dexmethylphenidate 5 mg p.o. daily, trazodone 100 mg p.o. nightly   Hypothyroidism -Continue home Levothyroxine 50  mcg po Daily    Recent cataract surgery -Resume home eye drops with Pred Forte, Vigamox and Acular 1 drop in the right eye daily  Class III Obesity -Complicates overall prognosis and care -Estimated body mass index is 40.69 kg/m as calculated from the following:   Height as of this  encounter: 5' (1.524 m).   Weight as of this encounter: 94.5 kg.  -Weight Loss and Dietary Counseling given

## 2023-02-03 NOTE — Evaluation (Signed)
Physical Therapy Brief Evaluation and Discharge Note Patient Details Name: Becky [name] MRN: [id number] DOB: 04-23-1950 Today's Date: 02/03/2023   History of Present Illness  72 yo F w/ pertinent PMH asthma, CVA, HTN, HLD, hypothyroidism, CKD presents to [facility] ED on 12/1 w/ angioedema. 12/01 noted pupils different sizes and CTA head ordered and unremarkable.  Clinical Impression   Patient evaluated by Physical Therapy with no further acute PT needs identified. All education has been completed and the patient has no further questions. Patient is referred to Mobility Team for continued ambulation. PT is signing off. Thank you for this referral.        PT Assessment Patient does not need any further PT services  Assistance Needed at Discharge  None    Equipment Recommendations None recommended by PT  Recommendations for Other Services       Precautions/Restrictions Precautions Precautions: None Precaution Comments: denies h/o falls        Mobility  Bed Mobility       General bed mobility comments: sitting EOB on arrival; denies difficulties  Transfers Overall transfer level: Independent Equipment used: None                    Ambulation/Gait Ambulation/Gait assistance: Independent Gait Distance (Feet): 170 Feet Assistive device: None Gait Pattern/deviations: WFL(Within Functional Limits) Gait Speed: Below normal General Gait Details: slightly slow, guarded initially; able to progress to her normal velocity  Home Activity Instructions    Stairs            Modified Rankin (Stroke Patients Only)        Balance Overall balance assessment: Independent (SLS 3 sec; eyes closed 10 sec; rhomberg 30 sec)                        Pertinent Vitals/Pain PT - Brief Vital Signs All Vital Signs Stable: Yes Pain Assessment Pain Assessment: No/denies pain     Home Living Family/patient expects to be discharged to:: Private  residence Living Arrangements: Alone Available Help at Discharge: Friend(s) Home Environment: Level entry   Home Equipment: Cane - single point        Prior Function Level of Independence: Independent Comments: typically no device; if going to be out in community for a long time will take her cane    UE/LE Assessment   UE ROM/Strength/Tone/Coordination: WFL    LE ROM/Strength/Tone/Coordination: [facility]      Communication   Communication Communication: No apparent difficulties     Cognition Overall Cognitive Status: Appears within functional limits for tasks assessed/performed       General Comments      Exercises     Assessment/Plan    PT Problem List         PT Visit Diagnosis Other abnormalities of gait and mobility (R26.89)    No Skilled PT Patient at baseline level of functioning;Patient is independent with all acitivity/mobility   Co-evaluation                AMPAC 6 Clicks Help needed turning from your back to your side while in a flat bed without using bedrails?: None Help needed moving from lying on your back to sitting on the side of a flat bed without using bedrails?: None Help needed moving to and from a bed to a chair (including a wheelchair)?: None Help needed standing up from a chair using your arms (e.g., wheelchair or bedside chair)?: None Help needed to  walk in hospital room?: None Help needed climbing 3-5 steps with a railing? : None 6 Click Score: 24      End of Session Equipment Utilized During Treatment: Gait belt Activity Tolerance: Patient tolerated treatment well Patient left: in chair;with call bell/phone within reach;with family/visitor present Nurse Communication: Mobility status PT Visit Diagnosis: Other abnormalities of gait and mobility (R26.89)     Time: 1610-9604 PT Time Calculation (min) (ACUTE ONLY): 16 min  Charges:   PT Evaluation $PT Eval Low Complexity: 1 Low       Jerolyn Center, PT Acute Rehabilitation  Services  Office [phone]   Zena Amos  02/03/2023, 11:38 AM

## 2023-02-03 NOTE — Plan of Care (Signed)
  Problem: Education: Goal: Knowledge of General Education information will improve Description: Including pain rating scale, medication(s)/side effects and non-pharmacologic comfort measures Outcome: Progressing   Problem: Health Behavior/Discharge Planning: Goal: Ability to manage health-related needs will improve Outcome: Progressing   Problem: Clinical Measurements: Goal: Ability to maintain clinical measurements within normal limits will improve Outcome: Progressing Goal: Will remain free from infection Outcome: Progressing Goal: Diagnostic test results will improve Outcome: Progressing Goal: Respiratory complications will improve Outcome: Progressing Goal: Cardiovascular complication will be avoided Outcome: Progressing   Problem: Activity: Goal: Risk for activity intolerance will decrease Outcome: Progressing   Problem: Nutrition: Goal: Adequate nutrition will be maintained Outcome: Progressing   Problem: Coping: Goal: Level of anxiety will decrease Outcome: Progressing   Problem: Pain Management: Goal: General experience of comfort will improve Outcome: Progressing   Problem: Safety: Goal: Ability to remain free from injury will improve Outcome: Progressing   Problem: Skin Integrity: Goal: Risk for impaired skin integrity will decrease Outcome: Progressing

## 2023-02-04 DIAGNOSIS — T783XXA Angioneurotic edema, initial encounter: Secondary | ICD-10-CM | POA: Diagnosis not present

## 2023-02-04 LAB — COMPREHENSIVE METABOLIC PANEL
ALT: 23 U/L (ref 0–44)
AST: 20 U/L (ref 15–41)
Albumin: 3.5 g/dL (ref 3.5–5.0)
Alkaline Phosphatase: 61 U/L (ref 38–126)
Anion gap: 10 (ref 5–15)
BUN: 21 mg/dL (ref 8–23)
CO2: 27 mmol/L (ref 22–32)
Calcium: 9.6 mg/dL (ref 8.9–10.3)
Chloride: 100 mmol/L (ref 98–111)
Creatinine, Ser: 1.02 mg/dL — ABNORMAL HIGH (ref 0.44–1.00)
GFR, Estimated: 58 mL/min — ABNORMAL LOW (ref >60)
Glucose, Bld: 103 mg/dL — ABNORMAL HIGH (ref 70–99)
Potassium: 3.5 mmol/L (ref 3.5–5.1)
Sodium: 137 mmol/L (ref 135–145)
Total Bilirubin: 0.7 mg/dL (ref <1.2)
Total Protein: 6.3 g/dL — ABNORMAL LOW (ref 6.5–8.1)

## 2023-02-04 LAB — CBC WITH DIFFERENTIAL/PLATELET
Abs Immature Granulocytes: 0.04 10*3/uL (ref 0.00–0.07)
Basophils Absolute: 0 10*3/uL (ref 0.0–0.1)
Basophils Relative: 0 %
Eosinophils Absolute: 0.2 10*3/uL (ref 0.0–0.5)
Eosinophils Relative: 2 %
HCT: 34.9 % — ABNORMAL LOW (ref 36.0–46.0)
Hemoglobin: 11.8 g/dL — ABNORMAL LOW (ref 12.0–15.0)
Immature Granulocytes: 0 %
Lymphocytes Relative: 23 %
Lymphs Abs: 2.1 10*3/uL (ref 0.7–4.0)
MCH: 31 pg (ref 26.0–34.0)
MCHC: 33.8 g/dL (ref 30.0–36.0)
MCV: 91.6 fL (ref 80.0–100.0)
Monocytes Absolute: 1.1 10*3/uL — ABNORMAL HIGH (ref 0.1–1.0)
Monocytes Relative: 12 %
Neutro Abs: 5.8 10*3/uL (ref 1.7–7.7)
Neutrophils Relative %: 63 %
Platelets: 315 10*3/uL (ref 150–400)
RBC: 3.81 MIL/uL — ABNORMAL LOW (ref 3.87–5.11)
RDW: 12.9 % (ref 11.5–15.5)
WBC: 9.3 10*3/uL (ref 4.0–10.5)
nRBC: 0 % (ref 0.0–0.2)

## 2023-02-04 LAB — MAGNESIUM: Magnesium: 1.8 mg/dL (ref 1.7–2.4)

## 2023-02-04 LAB — PHOSPHORUS: Phosphorus: 4.3 mg/dL (ref 2.5–4.6)

## 2023-02-04 MED ORDER — AMLODIPINE BESYLATE 10 MG PO TABS: 10.0000 mg | ORAL_TABLET | Freq: Every day | ORAL | 2 refills | Status: DC

## 2023-02-04 NOTE — Care Management Important Message (Signed)
Important Message  Patient Details  Name: Becky [name] MRN: [id number] Date of Birth: 09/29/1950   Important Message Given:  Yes - Medicare IM  Patient left prior to IM delivery will mail to the patient home address.    Tavon Magnussen 02/04/2023, 2:55 PM

## 2023-02-04 NOTE — Plan of Care (Signed)
  Problem: Education: Goal: Knowledge of General Education information will improve Description: Including pain rating scale, medication(s)/side effects and non-pharmacologic comfort measures Outcome: Adequate for Discharge   Problem: Health Behavior/Discharge Planning: Goal: Ability to manage health-related needs will improve Outcome: Adequate for Discharge   Problem: Clinical Measurements: Goal: Ability to maintain clinical measurements within normal limits will improve Outcome: Adequate for Discharge Goal: Will remain free from infection Outcome: Adequate for Discharge Goal: Diagnostic test results will improve Outcome: Adequate for Discharge Goal: Respiratory complications will improve Outcome: Adequate for Discharge Goal: Cardiovascular complication will be avoided Outcome: Adequate for Discharge   Problem: Activity: Goal: Risk for activity intolerance will decrease Outcome: Adequate for Discharge   Problem: Nutrition: Goal: Adequate nutrition will be maintained Outcome: Adequate for Discharge   Problem: Coping: Goal: Level of anxiety will decrease Outcome: Adequate for Discharge   Problem: Pain Management: Goal: General experience of comfort will improve Outcome: Adequate for Discharge   Problem: Safety: Goal: Ability to remain free from injury will improve Outcome: Adequate for Discharge   Problem: Skin Integrity: Goal: Risk for impaired skin integrity will decrease Outcome: Adequate for Discharge

## 2023-02-04 NOTE — Progress Notes (Signed)
OT Cancellation Note  Patient Details Name: Becky [name] MRN: [id number] DOB: [birth year]   Cancelled Treatment:    Reason Eval/Treat Not Completed: (P) OT screened, no needs identified, will sign off, Pt preparing for DC, states she has all needs met and has no further acute OT or follow up needs.   Alexis Goodell 02/04/2023, 11:24 AM

## 2023-02-04 NOTE — Progress Notes (Signed)
Mobility Specialist Progress Note:   02/04/23 1000  Mobility  Activity Ambulated with assistance in hallway  Level of Assistance Contact guard assist, steadying assist  Assistive Device Other (Comment) (HHA)  Distance Ambulated (ft) 200 ft  Activity Response Tolerated well  Mobility Referral Yes  $Mobility charge 1 Mobility  Mobility Specialist Start Time (ACUTE ONLY) 1000  Mobility Specialist Stop Time (ACUTE ONLY) 1013  Mobility Specialist Time Calculation (min) (ACUTE ONLY) 13 min   Pt agreeable to mobility session. Required only HHA for comfort. No c/o throughout, pt back sitting EOB with all needs met, eating breakfast.   Addison Lank Mobility Specialist Please contact via SecureChat or  Rehab office at [phone]

## 2023-02-04 NOTE — Discharge Summary (Signed)
Physician Discharge Summary  Becky [name] JSE:[id number] DOB: December 12, 1950 DOA: 02/01/2023  PCP: Daisy Floro, MD  Admit date: 02/01/2023 Discharge date: 02/04/2023  Admitted From: Home Disposition: Home  Recommendations for Outpatient Follow-up:  Follow up with PCP in 1-2 weeks Please obtain BMP/CBC in one week Keep a logbook of blood pressure at home and bring it to doctor's visit.  Discharge Condition: Stable CODE STATUS: Full code Diet recommendation: Low-salt diet  Discharge summary: 72 year old with history of previous stroke, hypertension, hyperlipidemia, hypothyroidism, CKD stage III AA presented to the emergency room with face and tongue swelling consistent with angioedema.  Patient is on nadolol and telmisartan at home.  Patient was on room air but had significant tongue swelling.  She was treated with EpiPen, Solu-Medrol, Pepcid and Benadryl and admitted to ICU overnight and subsequently transferred out of ICU and monitored in the medical floor.  Stabilized now.  Going home.  ACE and ARB updated as allergies.  Angioedema possibly secondary to telmisartan: Asymptomatic today. Discontinue telmisartan, started on Norvasc.  This will be prescribed. Patient is on nadolol for prolonged QTc and followed by cardiology.  She had some evidence of nocturnal bradycardia but heart rate mostly more than 35.  Will continue.  Blood pressures are acceptable.  Continue hydrochlorothiazide. Her chronic medical issues are stable.  Electrolytes are stable and replaced. Discharged in stable condition.  Education done regarding allergies and possible medications.   Discharge Diagnoses:  Principal Problem:   Angioedema    Discharge Instructions  Discharge Instructions     Diet - low sodium heart healthy   Complete by: As directed    Discharge instructions   Complete by: As directed    Do not take any medicine that are in the class of ACE and ARB   Increase activity slowly    Complete by: As directed       Allergies as of 02/04/2023       Reactions   Penicillins Anaphylaxis   Telmisartan    Suspected cause of angioedema    Iodinated Contrast Media Other (See Comments)   Iohexol Other (See Comments)    Desc: iodine   Latex Rash   Nsaids Other (See Comments)   CKD        Medication List     STOP taking these medications    azithromycin 250 MG tablet Commonly known as: Zithromax Z-Pak   chlorhexidine 0.12 % solution Commonly known as: PERIDEX   potassium chloride 10 MEQ tablet Commonly known as: KLOR-CON M   predniSONE 10 MG tablet Commonly known as: DELTASONE   telmisartan 80 MG tablet Commonly known as: MICARDIS       TAKE these medications    albuterol 108 (90 Base) MCG/ACT inhaler Commonly known as: VENTOLIN HFA Inhale 1 puff into the lungs every 4 (four) hours as needed for wheezing.   albuterol (2.5 MG/3ML) 0.083% nebulizer solution Commonly known as: PROVENTIL USE 1 VIAL IN NEBULIZER EVERY 6 HOURS AS NEEDED FOR SHORTNESS OF BREATH AND FOR WHEEZING   ALPRAZolam 0.25 MG tablet Commonly known as: XANAX Take 0.25 mg by mouth 3 (three) times daily as needed for anxiety or sleep.   amLODipine 10 MG tablet Commonly known as: NORVASC Take 1 tablet (10 mg total) by mouth daily. Start taking on: February 05, 2023   ARIPiprazole 5 MG tablet Commonly known as: ABILIFY Take 5 mg by mouth daily.   aspirin EC 325 MG tablet Take 650 mg by mouth at bedtime.  desvenlafaxine 100 MG 24 hr tablet Commonly known as: PRISTIQ Take 100 mg by mouth daily.   Dexmethylphenidate HCl 40 MG Cp24 Take 40 mg daily by mouth.   fluticasone-salmeterol 500-50 MCG/ACT Aepb Commonly known as: Advair Diskus Inhale 1 puff into the lungs in the morning and at bedtime.   hydrochlorothiazide 25 MG tablet Commonly known as: HYDRODIURIL Take 25 mg by mouth every morning.   levothyroxine 50 MCG tablet Commonly known as: SYNTHROID Take 50 mcg by  mouth daily before breakfast.   nadolol 20 MG tablet Commonly known as: CORGARD Take 1 tablet (20 mg total) by mouth daily. Patient needs appt for # 90 supply or future refills. Please call office to schedule appt.   NONFORMULARY OR COMPOUNDED ITEM Place 1-4 drops into the right eye See admin instructions. Prednisolone 1% / Moxifloxacin 0.5% / Bromfenac 0.075% Opth Soln.  Instill 1 drop into the right eye four times daily for 4 days, then 1 drop twice a day for 2 days, then 1 drop once a day until finished.   OVER THE COUNTER MEDICATION Take 2 tablets by mouth daily as needed. Equate indigestion relief.   pramipexole 0.25 MG tablet Commonly known as: MIRAPEX Take 0.5 mg by mouth at bedtime.   pravastatin 40 MG tablet Commonly known as: PRAVACHOL Take 40 mg by mouth daily.        Allergies  Allergen Reactions   Penicillins Anaphylaxis   Telmisartan     Suspected cause of angioedema    Iodinated Contrast Media Other (See Comments)   Iohexol Other (See Comments)     Desc: iodine    Latex Rash   Nsaids Other (See Comments)    CKD     Consultations: Critical care   Procedures/Studies: CT ANGIO HEAD NECK W WO CM  Result Date: 02/01/2023 CLINICAL DATA:  Neuro deficit with stroke suspected/possible. EXAM: CT ANGIOGRAPHY HEAD AND NECK WITH AND WITHOUT CONTRAST TECHNIQUE: Multidetector CT imaging of the head and neck was performed using the standard protocol during bolus administration of intravenous contrast. Multiplanar CT image reconstructions and MIPs were obtained to evaluate the vascular anatomy. Carotid stenosis measurements (when applicable) are obtained utilizing NASCET criteria, using the distal internal carotid diameter as the denominator. RADIATION DOSE REDUCTION: This exam was performed according to the departmental dose-optimization program which includes automated exposure control, adjustment of the mA and/or kV according to patient size and/or use of iterative  reconstruction technique. CONTRAST:  75mL OMNIPAQUE IOHEXOL 350 MG/ML SOLN COMPARISON:  Head CT from earlier today FINDINGS: CTA NECK FINDINGS Aortic arch: Atheromatous plaque with 3 vessel branching Right carotid system: Partial retropharyngeal course. No stenosis, beading, or ulceration Left carotid system: Calcified plaque at the distal common carotid and proximal ICA without ulceration, beading, or significant stenosis. Vertebral arteries: Mild proximal subclavian atherosclerosis on the left. The vertebral arteries are smoothly contoured and widely patent to the dura. Skeleton: No acute finding Other neck: No acute finding Upper chest: Negative Review of the MIP images confirms the above findings CTA HEAD FINDINGS Anterior circulation: Atheromatous calcification of the cavernous carotids. No branch occlusion, beading, or aneurysm. Mild atheromatous irregularity of intracranial branches. Posterior circulation: The vertebral and basilar arteries are smoothly contoured and widely patent. Fetal type left PCA flow. No branch occlusion, beading, or aneurysm. Moderate atheromatous irregularity of intracranial branches, greatest at the right PCA where there is moderate P2 segment narrowing. Additional left PCA branch stenosis that is moderate or advanced. Venous sinuses: Unremarkable for arterial timing. Anatomic  variants: As above Review of the MIP images confirms the above findings IMPRESSION: 1. No emergent finding. 2. Atherosclerosis in the neck without significant stenosis or vessel irregularity. 3. Generalized intracranial atherosclerosis with up to moderate right PCA stenosis. Electronically Signed   By: Tiburcio Pea M.D.   On: 02/01/2023 09:00   CT HEAD WO CONTRAST ( )  Result Date: 02/01/2023 CLINICAL DATA:  Neuro deficit with stroke suspected EXAM: CT HEAD WITHOUT CONTRAST TECHNIQUE: Contiguous axial images were obtained from the base of the skull through the vertex without intravenous contrast.  RADIATION DOSE REDUCTION: This exam was performed according to the departmental dose-optimization program which includes automated exposure control, adjustment of the mA and/or kV according to patient size and/or use of iterative reconstruction technique. COMPARISON:  07/08/2022 brain MRI FINDINGS: Brain: No evidence of acute infarction, hemorrhage, hydrocephalus, extra-axial collection or mass lesion/mass effect. Chronic left anterior frontal encephalomalacia involving a moderate area. Disproportionate subarachnoid spaces with perisylvian widening and some narrowing of the callosal angle, but no ventriculomegaly to the degree that NPH is implicated. Focal atrophy may contribute to this appearance. Vascular: No hyperdense vessel or unexpected calcification. Skull: Normal. Negative for fracture or focal lesion. Sinuses/Orbits: No acute finding. IMPRESSION: No acute finding.  Stable compared to brain MRI earlier this year. Electronically Signed   By: Tiburcio Pea M.D.   On: 02/01/2023 08:54   (Echo, Carotid, EGD, Colonoscopy, ERCP)    Subjective: Seen and examined in the morning rounds.  Denies any complaints.  Eager to go home.   Discharge Exam: Vitals:   02/04/23 0804 02/04/23 0836  BP: (!) 145/78   Pulse: (!) 57 (!) 57  Resp: 20 12  Temp: 98.3 F (36.8 C)   SpO2: 95% 95%   Vitals:   02/04/23 0457 02/04/23 0500 02/04/23 0804 02/04/23 0836  BP: (!) 152/67  (!) 145/78   Pulse: (!) 58  (!) 57 (!) 57  Resp: 19  20 12   Temp: 97.9 F (36.6 C)  98.3 F (36.8 C)   TempSrc:   Oral   SpO2: 99%  95% 95%  Weight:  91.5 kg    Height:        General: Pt is alert, awake, not in acute distress Cardiovascular: RRR, S1/S2 +, no rubs, no gallops Respiratory: CTA bilaterally, no wheezing, no rhonchi Abdominal: Soft, NT, ND, bowel sounds + Extremities: no edema, no cyanosis    The results of significant diagnostics from this hospitalization (including imaging, microbiology, ancillary and  laboratory) are listed below for reference.     Microbiology: Recent Results (from the past 240 hour(s))  MRSA Next Gen by PCR, Nasal     Status: None   Collection Time: 02/01/23  4:05 AM   Specimen: Nasal Mucosa; Nasal Swab  Result Value Ref Range Status   MRSA by PCR Next Gen NOT DETECTED NOT DETECTED Final    Comment: (NOTE) The GeneXpert MRSA Assay (FDA approved for NASAL specimens only), is one component of a comprehensive MRSA colonization surveillance program. It is not intended to diagnose MRSA infection nor to guide or monitor treatment for MRSA infections. Test performance is not FDA approved in patients less than 42 years old. Performed at [facility], [address] [place], [place] [address]      Labs: BNP (last 3 results) No results for input(s): "BNP" in the last 8760 hours. Basic Metabolic Panel: Recent Labs  Lab 02/01/23 0225 02/01/23 0810 02/02/23 0330 02/03/23 2725 02/03/23 1147 02/04/23 3664  NA 137 136 138 137  --  137  K 2.9* 4.0 3.7 3.7  --  3.5  CL 99 103 103 101  --  100  CO2 28 23 26 24   --  27  GLUCOSE 114* 150* 138* 87  --  103*  BUN 20 20 23  24*  --  21  CREATININE 1.05* 1.17* 1.46* 1.00  --  1.02*  CALCIUM 9.3 9.0 9.1 9.0  --  9.6  MG  --  1.9 2.3  --  2.0 1.8  PHOS  --   --  3.3  --  3.2 4.3   Liver Function Tests: Recent Labs  Lab 02/01/23 0810 02/03/23 1147 02/04/23 0705  AST 20 24 20   ALT 22 25 23   ALKPHOS 70 75 61  BILITOT 0.6 0.6 0.7  PROT 6.7 7.4 6.3*  ALBUMIN 3.7 4.1 3.5   No results for input(s): "LIPASE", "AMYLASE" in the last 168 hours. No results for input(s): "AMMONIA" in the last 168 hours. CBC: Recent Labs  Lab 02/01/23 0225 02/01/23 0810 02/02/23 1019 02/03/23 1356 02/04/23 0705  WBC 9.5 10.0 16.2* 11.9* 9.3  NEUTROABS 5.9  --   --  8.6* 5.8  HGB 12.5 11.8* 12.3 12.8 11.8*  HCT 36.4 35.2* 37.2 36.7 34.9*  MCV 92.9 91.9 92.3 88.6 91.6  PLT 354 316 343 345 315   Cardiac Enzymes: No results  for input(s): "CKTOTAL", "CKMB", "CKMBINDEX", "TROPONINI" in the last 168 hours. BNP: Invalid input(s): "POCBNP" CBG: Recent Labs  Lab 02/01/23 1514 02/01/23 1928 02/01/23 2335 02/02/23 0308 02/02/23 0719  GLUCAP 174* 197* 150* 143* 114*   D-Dimer No results for input(s): "DDIMER" in the last 72 hours. Hgb A1c No results for input(s): "HGBA1C" in the last 72 hours. Lipid Profile No results for input(s): "CHOL", "HDL", "LDLCALC", "TRIG", "CHOLHDL", "LDLDIRECT" in the last 72 hours. Thyroid function studies No results for input(s): "TSH", "T4TOTAL", "T3FREE", "THYROIDAB" in the last 72 hours.  Invalid input(s): "FREET3" Anemia work up No results for input(s): "VITAMINB12", "FOLATE", "FERRITIN", "TIBC", "IRON", "RETICCTPCT" in the last 72 hours. Urinalysis    Component Value Date/Time   COLORURINE Yellow 06/30/2013 1836   COLORURINE YELLOW 12/17/2009 1932   APPEARANCEUR Clear 06/30/2013 1836   LABSPEC 1.015 06/30/2013 1836   PHURINE 6.0 06/30/2013 1836   PHURINE 6.0 12/17/2009 1932   GLUCOSEU Negative 06/30/2013 1836   HGBUR Negative 06/30/2013 1836   HGBUR NEGATIVE 12/17/2009 1932   BILIRUBINUR Negative 06/30/2013 1836   KETONESUR Negative 06/30/2013 1836   KETONESUR NEGATIVE 12/17/2009 1932   PROTEINUR Negative 06/30/2013 1836   PROTEINUR NEGATIVE 12/17/2009 1932   UROBILINOGEN 0.2 12/17/2009 1932   NITRITE Negative 06/30/2013 1836   NITRITE NEGATIVE 12/17/2009 1932   LEUKOCYTESUR Trace 06/30/2013 1836   Sepsis Labs Recent Labs  Lab 02/01/23 0810 02/02/23 1019 02/03/23 1356 02/04/23 0705  WBC 10.0 16.2* 11.9* 9.3   Microbiology Recent Results (from the past 240 hour(s))  MRSA Next Gen by PCR, Nasal     Status: None   Collection Time: 02/01/23  4:05 AM   Specimen: Nasal Mucosa; Nasal Swab  Result Value Ref Range Status   MRSA by PCR Next Gen NOT DETECTED NOT DETECTED Final    Comment: (NOTE) The GeneXpert MRSA Assay (FDA approved for NASAL specimens  only), is one component of a comprehensive MRSA colonization surveillance program. It is not intended to diagnose MRSA infection nor to guide or monitor treatment for MRSA infections. Test performance is not FDA approved  in patients less than 15 years old. Performed at [facility], [address], [place], [place] [address]      Time coordinating discharge: 29 minutes  SIGNED:   Dorcas Carrow, MD  [facility] 02/04/2023, 10:33 AM

## 2023-02-04 NOTE — Progress Notes (Signed)
Call from tele monitoring reporting that patient is having episodes of bradycardia where she will drop down into the 30s-40s but then return right back to normal rhythm. Patient is resting in bed and asymptomatic during this as well. On call provider notified and to continue to monitor

## 2023-02-11 DIAGNOSIS — N1831 Chronic kidney disease, stage 3a: Secondary | ICD-10-CM | POA: Diagnosis not present

## 2023-02-11 DIAGNOSIS — I10 Essential (primary) hypertension: Secondary | ICD-10-CM | POA: Diagnosis not present

## 2023-02-11 DIAGNOSIS — T783XXD Angioneurotic edema, subsequent encounter: Secondary | ICD-10-CM | POA: Diagnosis not present

## 2023-02-18 DIAGNOSIS — H43813 Vitreous degeneration, bilateral: Secondary | ICD-10-CM | POA: Diagnosis not present

## 2023-02-18 DIAGNOSIS — H04123 Dry eye syndrome of bilateral lacrimal glands: Secondary | ICD-10-CM | POA: Diagnosis not present

## 2023-02-18 DIAGNOSIS — H34831 Tributary (branch) retinal vein occlusion, right eye, with macular edema: Secondary | ICD-10-CM | POA: Diagnosis not present

## 2023-02-18 DIAGNOSIS — H35371 Puckering of macula, right eye: Secondary | ICD-10-CM | POA: Diagnosis not present

## 2023-02-18 DIAGNOSIS — H35033 Hypertensive retinopathy, bilateral: Secondary | ICD-10-CM | POA: Diagnosis not present

## 2023-02-19 ENCOUNTER — Ambulatory Visit (HOSPITAL_BASED_OUTPATIENT_CLINIC_OR_DEPARTMENT_OTHER): Payer: Medicare Other | Admitting: Pulmonary Disease

## 2023-02-19 ENCOUNTER — Encounter (HOSPITAL_BASED_OUTPATIENT_CLINIC_OR_DEPARTMENT_OTHER): Payer: Self-pay | Admitting: Pulmonary Disease

## 2023-02-19 VITALS — BP 122/72 | HR 66 | Resp 20 | Ht 60.0 in | Wt 202.1 lb

## 2023-02-19 DIAGNOSIS — U099 Post covid-19 condition, unspecified: Secondary | ICD-10-CM | POA: Diagnosis not present

## 2023-02-19 DIAGNOSIS — J4540 Moderate persistent asthma, uncomplicated: Secondary | ICD-10-CM | POA: Diagnosis not present

## 2023-02-19 DIAGNOSIS — M545 Low back pain, unspecified: Secondary | ICD-10-CM

## 2023-02-19 DIAGNOSIS — R0600 Dyspnea, unspecified: Secondary | ICD-10-CM | POA: Diagnosis not present

## 2023-02-19 DIAGNOSIS — G4733 Obstructive sleep apnea (adult) (pediatric): Secondary | ICD-10-CM | POA: Diagnosis not present

## 2023-02-19 MED ORDER — ALBUTEROL SULFATE HFA 108 (90 BASE) MCG/ACT IN AERS: 1.0000 | INHALATION_SPRAY | RESPIRATORY_TRACT | 2 refills | Status: DC | PRN

## 2023-02-19 MED ORDER — FLUTICASONE-SALMETEROL 500-50 MCG/ACT IN AEPB: 1.0000 | INHALATION_SPRAY | Freq: Two times a day (BID) | RESPIRATORY_TRACT | 11 refills | Status: DC

## 2023-02-19 MED ORDER — TRAMADOL HCL 50 MG PO TABS: 50.0000 mg | ORAL_TABLET | Freq: Four times a day (QID) | ORAL | 0 refills | Status: AC | PRN

## 2023-02-19 NOTE — Patient Instructions (Signed)
Moderate persistent asthma - well controlled CONTINUE Advair 500-50 ONE puff TWICE a day. REFILL CONTINUE Albuterol or nebulizer AS NEEDED. REFILL STOP singulair  Severe OSA: Fatigue, daytime sleepiness Declined PAP titration study again today Feels like she is sleeping well  Deconditioning/Back pain --REFER to outpatient therapy at [facility] --Recommend tylenol and ibuprofen as needed per bottle instructions --Tramadol if the above medications do not alleviate pain

## 2023-02-19 NOTE — Progress Notes (Signed)
Subjective:   PATIENT ID: Becky [name] GENDER: female DOB: 1951/03/03, MRN: [id number]   HPI  Chief Complaint  Patient presents with   Follow-up    Doing pretty good. Had a reaction to some meds that set her back but she is coming back.     Reason for Visit: Follow-up   [name] is a 72 year old female with hx COVID 08/2020 followed by post-viral asthma, hx CVA, hx melanoma, HTN, CKD who presents for asthma follow-up  Synopsis: 2022 -She was diagnosed with COVID-19 in July 2022. Denies prior history of asthma or frequent respiratory infections.  2023 - March and May had outpatient exacerbation and hospitalization in July. Flovent>Advair. Improved control of symptoms. 2024 - Well-controlled on high dose Advair with no exacerbations. Recent dizziness and deconditioning affecting her activity level  07/24/22 Since our last visit she is compliant with her Advair 500 twice a day. Rarely uses rescue inhaler. Denies shortness of breath, cough, wheezing. Denies nocturnal symptoms. No exacerbations in the 11 months. Limited in activity due to weakness. Dizziness has improved and followed by Neurology. Depression is still affecting her and only sees therapist twice a year and taking anti-depressants. Feels tired but reports her sleep is fine.  02/19/23 Since our last visit she was seen twice in our clinic with abx and steroids in November. She was also hospitalized for angioedema secondary to telmisartan. Today she reports using albuterol for shortness of breath that she attributes to deconditioning. Denies cough or wheezing. Reports some back pain in the last few weeks not improving with over the counter. Interested in water pool therapy.  Asthma Control Test ACT Total Score  01/08/2023  1:12 PM 7  07/24/2022  2:40 PM 24  04/25/2022  3:27 PM 17   2023 Jan Feb Mar April May June July Aug Sept Oct Nov Dec     X  X X        2024 Jan Feb Mar April May June July Aug Sept Oct Nov  Dec             XX   2025 Jan Feb Mar April May June July Aug Sept Oct Nov Dec                     09/11/2021    3:24 PM  Results of the Epworth flowsheet  Sitting and reading 2  Watching TV 1  Sitting, inactive in a public place (e.g. a theatre or a meeting) 0  As a passenger in a car for an hour without a break 1  Lying down to rest in the afternoon when circumstances permit 1  Sitting and talking to someone 0  Sitting quietly after a lunch without alcohol 0  In a car, while stopped for a few minutes in traffic 0  Total score 5    Social History: Never smoker Childhood second hand smoke exposure  Past Medical History:  Diagnosis Date   Chronic kidney disease    CVA (cerebral vascular accident) (HCC) 11/14/2013   Depression    followed by Dr Becky [name]   Falls frequently 10/01/2015   Genital herpes    History of melanoma    followed by derm Dr Becky [name]   Hyperlipidemia    Hypertension    MCI (mild cognitive impairment) 10/01/2015   Pure hypercholesterolemia 11/14/2013   Stroke [facility])    Old stroke seen on MRI 12/2009, carotid doppler w minimal plaque, MRI off the head  otherwise neg for acute disease.   Thyroid disease    hypothyroid. Followed by Dr Becky [name]   TIA (transient ischemic attack) 8/08   followed by neurologist Dr Becky [name]     Family History  Problem Relation Age of Onset   Breast cancer Mother    Hyperlipidemia Father    Hypertension Father    Heart attack Father    Hypertension Sister    Hyperlipidemia Sister    Dementia Maternal Aunt      Social History   Occupational History   Not on file  Tobacco Use   Smoking status: Never   Smokeless tobacco: Never  Vaping Use   Vaping status: Never Used  Substance and Sexual Activity   Alcohol use: No   Drug use: No   Sexual activity: Never    Allergies  Allergen Reactions   Penicillins Anaphylaxis   Telmisartan     Suspected cause of angioedema    Iodinated Contrast Media Other (See Comments)   Iohexol  Other (See Comments)     Desc: iodine    Latex Rash   Nsaids Other (See Comments)    CKD      Outpatient Medications Prior to Visit  Medication Sig Dispense Refill   albuterol (PROVENTIL) (2.5 MG/3ML) 0.083% nebulizer solution USE 1 VIAL IN NEBULIZER EVERY 6 HOURS AS NEEDED FOR SHORTNESS OF BREATH AND FOR WHEEZING 225 mL 3   albuterol (VENTOLIN HFA) 108 (90 Base) MCG/ACT inhaler Inhale 1 puff into the lungs every 4 (four) hours as needed for wheezing.     ALPRAZolam (XANAX) 0.25 MG tablet Take 0.25 mg by mouth 3 (three) times daily as needed for anxiety or sleep.  1   amLODipine (NORVASC) 10 MG tablet Take 1 tablet (10 mg total) by mouth daily. 30 tablet 2   ARIPiprazole (ABILIFY) 5 MG tablet Take 5 mg by mouth daily.     aspirin EC 325 MG tablet Take 650 mg by mouth at bedtime.     desvenlafaxine (PRISTIQ) 100 MG 24 hr tablet Take 100 mg by mouth daily.     Dexmethylphenidate HCl 40 MG CP24 Take 40 mg daily by mouth.     fluticasone-salmeterol (ADVAIR DISKUS) 500-50 MCG/ACT AEPB Inhale 1 puff into the lungs in the morning and at bedtime. 60 each 11   hydrochlorothiazide (HYDRODIURIL) 25 MG tablet Take 25 mg by mouth every morning.     levothyroxine (SYNTHROID) 50 MCG tablet Take 50 mcg by mouth daily before breakfast.     nadolol (CORGARD) 20 MG tablet Take 1 tablet (20 mg total) by mouth daily. Patient needs appt for # 90 supply or future refills. Please call office to schedule appt. 30 tablet 0   NONFORMULARY OR COMPOUNDED ITEM Place 1-4 drops into the right eye See admin instructions. Prednisolone 1% / Moxifloxacin 0.5% / Bromfenac 0.075% Opth Soln.  Instill 1 drop into the right eye four times daily for 4 days, then 1 drop twice a day for 2 days, then 1 drop once a day until finished.     OVER THE COUNTER MEDICATION Take 2 tablets by mouth daily as needed. Equate indigestion relief.     pramipexole (MIRAPEX) 0.25 MG tablet Take 0.5 mg by mouth at bedtime.  11   pravastatin (PRAVACHOL)  40 MG tablet Take 40 mg by mouth daily.     No facility-administered medications prior to visit.    Review of Systems  Constitutional:  Positive for malaise/fatigue. Negative for chills, diaphoresis, fever and  weight loss.  HENT:  Negative for congestion.   Respiratory:  Positive for shortness of breath. Negative for cough, hemoptysis, sputum production and wheezing.   Cardiovascular:  Negative for chest pain, palpitations and leg swelling.  Musculoskeletal:  Positive for back pain.     Objective:   Vitals:   02/19/23 1032  BP: 122/72  Pulse: 66  Resp: 20  SpO2: 100%  Weight: 202 lb 1.6 oz (91.7 kg)  Height: 5' (1.524 m)   SpO2: 100 %  Physical Exam: General: Well-appearing, no acute distress HENT: [place], AT Eyes: EOMI, no scleral icterus Respiratory: Clear to auscultation bilaterally.  No crackles, wheezing or rales Cardiovascular: RRR, -M/R/G, no JVD Extremities:-Edema,-tenderness Neuro: AAO x4, CNII-XII grossly intact Psych: Normal mood, normal affect   Data Reviewed:  Imaging: CXR 03/15/21 - Lingular scarring. No infiltrate, effusion or edema CT Chest 08/17/21 - No parenchymal abnormalities  PFT: 05/06/21 FVC 1.88 (74%) FEV1 1.5 (79%) Ratio 81  TLC 127% DLCO 89% Interpretation: No obstructive defect on spirometry however air trapping present and F-V curves suggestive of obstructive defect/small airway disease. Normal gas exchange  Labs: CBC    Component Value Date/Time   WBC 9.3 02/04/2023 0705   RBC 3.81 (L) 02/04/2023 0705   HGB 11.8 (L) 02/04/2023 0705   HGB 13.0 06/30/2013 1300   HCT 34.9 (L) 02/04/2023 0705   HCT 38.7 06/30/2013 1300   PLT 315 02/04/2023 0705   PLT 341 06/30/2013 1300   MCV 91.6 02/04/2023 0705   MCV 89 06/30/2013 1300   MCH 31.0 02/04/2023 0705   MCHC 33.8 02/04/2023 0705   RDW 12.9 02/04/2023 0705   RDW 13.4 06/30/2013 1300   LYMPHSABS 2.1 02/04/2023 0705   LYMPHSABS 2.7 06/30/2013 1300   MONOABS 1.1 (H) 02/04/2023 0705    MONOABS 1.1 (H) 06/30/2013 1300   EOSABS 0.2 02/04/2023 0705   EOSABS 0.2 06/30/2013 1300   BASOSABS 0.0 02/04/2023 0705   BASOSABS 0.1 06/30/2013 1300   Abs eos 08/12/21 -300 IgE 08/12/21 -131    Assessment & Plan:   Discussion: 72 year old female never smoker with hx COVID 08/2020 followed by post-viral asthma, hx melanoma, HTN, CKD who presents for follow-up. Recent exacerbation in Nov 2024 that has been treated. Doing well now. Deconditioning still remains and issue.  COVID-19 long hauler manifesting in dyspnea Moderate persistent asthma - recent exacerbations but currently controlled CONTINUE Advair 500-50 ONE puff TWICE a day. REFILL CONTINUE Albuterol or nebulizer AS NEEDED. REFILL STOP singulair  Severe OSA: Fatigue, daytime sleepiness Declined PAP titration study again today Feels like she is sleeping well  Deconditioning/Back pain --REFER to outpatient therapy at [facility] --Recommend tylenol and ibuprofen as needed per bottle instructions --Tramadol if the above medications do not alleviate pain  Depression --STOP singulair --Currently in therapy --Consider options with insurance for additional support/therapy. Online options like [email or web address]  Health Maintenance Immunization History  Administered Date(s) Administered   Fluad Quad(high Dose 65+) 11/20/2021   Influenza Split 12/30/2020   Influenza, High Dose Seasonal PF 11/05/2016, 12/09/2017, 12/16/2018, 12/29/2019   Influenza,inj,quad, With Preservative 10/19/2014, 10/22/2015   Moderna Sars-Covid-2 Vaccination 02/10/2020   PFIZER(Purple Top)SARS-COV-2 Vaccination 05/14/2019, 06/04/2019   Pneumococcal Conjugate-13 11/05/2016   Pneumococcal Polysaccharide-23 12/25/2017   Td 10/19/2014   Zoster Recombinant(Shingrix) 12/01/2017, 11/19/2018   Zoster, Live 12/01/2017, 11/19/2018   CT Lung Screen - not qualified. Never smoker  No orders of the defined types were placed in this encounter.  No orders of the  defined types  were placed in this encounter.   No follow-ups on file.  I have spent a total time of 35-minutes on the day of the appointment including chart review, data review, collecting history, coordinating care and discussing medical diagnosis and plan with the patient/family. Past medical history, allergies, medications were reviewed. Pertinent imaging, labs and tests included in this note have been reviewed and interpreted independently by me.  Merlie Noga Mechele Collin, MD [facility] 02/19/2023 10:55 AM  Office Number [phone]

## 2023-02-20 ENCOUNTER — Telehealth: Payer: Self-pay | Admitting: Pulmonary Disease

## 2023-02-20 DIAGNOSIS — N1831 Chronic kidney disease, stage 3a: Secondary | ICD-10-CM | POA: Diagnosis not present

## 2023-02-23 NOTE — Telephone Encounter (Signed)
I called pt to inform her that the Disability Parking Placard has been signed by Buelah Manis, NP on 02-23-2023 and will be mailed off for her. I made a copy of this to be scanned into her chart and pt verbally stated her understanding. AB, CMA 02-23-2023

## 2023-03-02 ENCOUNTER — Ambulatory Visit: Payer: Medicare Other | Admitting: Primary Care

## 2023-03-11 ENCOUNTER — Ambulatory Visit: Payer: No Typology Code available for payment source | Admitting: Physician Assistant

## 2023-03-11 NOTE — Progress Notes (Deleted)
 .   Cardiology Office Note Date:  03/11/2023  Patient ID:  Becky [name] 06/23/1950, MRN [id number] PCP:  Dayna Motto, DO  Electrophysiologist:  Dr. Inocencio   Chief Complaint:  *** over-due annual visit  History of Present Illness: Becky [name] is a 73 y.o. female with history of HTN, HLD, CKD, stroke, TIA, hypothyroidism, prolonged QT.   She comes in today to be seen for Dr. Inocencio.  Last seen by him 09/27/2018.  She was doing well, discussed some degree of SOB at rest and exertion.  She was taking nadolol , though currently had run out. Her medicine refilled, instructed to avoid QT prolonging medicines. Planned for stress test to evaluate her SOB, though low suspicion for anginal equivalent.  Stress was low risk.   I saw her Aug 2021 She is doing well She has a small dog that she walks 5x per day, takes about 10-103minutes stopping for the dog, she is unsure the length of the walk, but denies any exertional intolerances with her walks or her ADLs Denies any CP, palpitations or cardiac awareness NO SOB/DOE No dizzy spells, near syncope or syncope She had Trazodone  that she used infrequently, urged not to with some Qt properties and see her PMD for an alternative  COVID in July  I saw her 01/29/21 She reports that her COVID infection was terrible and she felt awful for a long time, remains SOB with minimal exertion. She says she felt GREAT prior to getting sick and has never felt well again She denies CP, palpitations No near syncope or syncope. She is frustrated over the persistent SOB since COVID Advised to d/w her PMD alternative to trazodone , that she used infrequently but used Had been Rx ZOFRAN  with her covid illness and had used it  Counseled on meds and stopped Planned for an echo  LVEF 60%, no WMA , mild LVH, grade I DD, Mildly increased right ventricular wall thickness, no VHD, Trivial pericardial effusion  not felt to be changed from prior  No  f/u here since  Admitted 02/01/23 with angioedema, She was treated with EpiPen , Solu-Medrol , Pepcid  and Benadryl  and admitted to ICU  Suspected 2/2 telmisartan and added as an allergy Started on norvasc  Nocturnal bradycardia observed on telemetry > nadolol  CONTINUED noting hx of prolonged QT. Discharged 02/04/23  *** meds, QT *** counsel *** symptoms, syncope... *** brady? *** grade I DD   Past Medical History:  Diagnosis Date   Chronic kidney disease    CVA (cerebral vascular accident) (HCC) 11/14/2013   Depression    followed by Dr Vincente Noel frequently 10/01/2015   Genital herpes    History of melanoma    followed by derm Dr Robinson   Hyperlipidemia    Hypertension    MCI (mild cognitive impairment) 10/01/2015   Pure hypercholesterolemia 11/14/2013   Stroke [facility])    Old stroke seen on MRI 12/2009, carotid doppler w minimal plaque, MRI off the head otherwise neg for acute disease.   Thyroid  disease    hypothyroid. Followed by Dr Vincente   TIA (transient ischemic attack) 8/08   followed by neurologist Dr Rosemarie    Past Surgical History:  Procedure Laterality Date   ABDOMINAL HYSTERECTOMY     APPENDECTOMY     CHOLECYSTECTOMY     MELANOMA EXCISION     x2    Current Outpatient Medications  Medication Sig Dispense Refill   albuterol  (PROVENTIL ) (2.5 MG/3ML) 0.083% nebulizer solution USE 1 VIAL IN  NEBULIZER EVERY 6 HOURS AS NEEDED FOR SHORTNESS OF BREATH AND FOR WHEEZING 225 mL 3   albuterol  (VENTOLIN  HFA) 108 (90 Base) MCG/ACT inhaler Inhale 1 puff into the lungs every 4 (four) hours as needed for wheezing. 8 g 2   ALPRAZolam  (XANAX ) 0.25 MG tablet Take 0.25 mg by mouth 3 (three) times daily as needed for anxiety or sleep.  1   amLODipine  (NORVASC ) 10 MG tablet Take 1 tablet (10 mg total) by mouth daily. 30 tablet 2   ARIPiprazole  (ABILIFY ) 5 MG tablet Take 5 mg by mouth daily.     aspirin  EC 325 MG tablet Take 650 mg by mouth at bedtime.     desvenlafaxine  (PRISTIQ ) 100 MG  24 hr tablet Take 100 mg by mouth daily.     Dexmethylphenidate  HCl 40 MG CP24 Take 40 mg daily by mouth.     fluticasone -salmeterol (ADVAIR  DISKUS) 500-50 MCG/ACT AEPB Inhale 1 puff into the lungs in the morning and at bedtime. 60 each 11   hydrochlorothiazide  (HYDRODIURIL ) 25 MG tablet Take 25 mg by mouth every morning.     levothyroxine  (SYNTHROID ) 50 MCG tablet Take 50 mcg by mouth daily before breakfast.     nadolol  (CORGARD ) 20 MG tablet Take 1 tablet (20 mg total) by mouth daily. Patient needs appt for # 90 supply or future refills. Please call office to schedule appt. 30 tablet 0   NONFORMULARY OR COMPOUNDED ITEM Place 1-4 drops into the right eye See admin instructions. Prednisolone  1% / Moxifloxacin  0.5% / Bromfenac 0.075% Opth Soln.  Instill 1 drop into the right eye four times daily for 4 days, then 1 drop twice a day for 2 days, then 1 drop once a day until finished.     OVER THE COUNTER MEDICATION Take 2 tablets by mouth daily as needed. Equate indigestion relief.     pramipexole  (MIRAPEX ) 0.25 MG tablet Take 0.5 mg by mouth at bedtime.  11   pravastatin  (PRAVACHOL ) 40 MG tablet Take 40 mg by mouth daily.     No current facility-administered medications for this visit.    Allergies:   Penicillins, Telmisartan, Iodinated contrast media, Iohexol , Latex, and Nsaids   Social History:  The patient  reports that she has never smoked. She has never used smokeless tobacco. She reports that she does not drink alcohol and does not use drugs.   Family History:  The patient's family history includes Breast cancer in her mother; Dementia in her maternal aunt; Heart attack in her father; Hyperlipidemia in her father and sister; Hypertension in her father and sister.  ROS:  Please see the history of present illness. All other systems are reviewed and otherwise negative.   PHYSICAL EXAM:  VS:  There were no vitals taken for this visit. BMI: There is no height or weight on file to calculate  BMI. Well nourished, well developed, in no acute distress  HEENT: normocephalic, atraumatic  Neck: no JVD, carotid bruits or masses Cardiac:  *** RRR; no significant murmurs, no rubs, or gallops Lungs: ***  CTA b/l, no wheezing, rhonchi or rales  Abd: soft, nontender MS: no deformity or atrophy Ext: *** no edema  Skin: warm and dry, no rash Neuro:  No gross deficits appreciated Psych: euthymic mood, full affect   EKG:  Done today and reviewed by myself shows  ***  08/18/21: TTE  1. Left ventricular ejection fraction, by estimation, is 65 to 70%. The  left ventricle has normal function. The left ventricle  has no regional  wall motion abnormalities. There is mild concentric left ventricular  hypertrophy. Left ventricular diastolic  parameters are consistent with Grade I diastolic dysfunction (impaired  relaxation). Elevated left ventricular end-diastolic pressure.   2. Right ventricular systolic function is normal. The right ventricular  size is normal. There is normal pulmonary artery systolic pressure.   3. The mitral valve is normal in structure. Trivial mitral valve  regurgitation. No evidence of mitral stenosis.   4. The aortic valve is normal in structure. Aortic valve regurgitation is  not visualized. No aortic stenosis is present.   5. The inferior vena cava is normal in size with greater than 50%  respiratory variability, suggesting right atrial pressure of 3 mmHg.    02/21/21: TTE 1. Left ventricular ejection fraction, by estimation, is 60%. The left  ventricle has normal function. The left ventricle has no regional wall  motion abnormalities. There is mild concentric left ventricular  hypertrophy. Left ventricular diastolic  parameters are consistent with Grade I diastolic dysfunction (impaired  relaxation).   2. Right ventricular systolic function is normal. The right ventricular  size is normal. Mildly increased right ventricular wall thickness.  Tricuspid  regurgitation signal is inadequate for assessing PA pressure.   3. The pericardial effusion is anterior to the right ventricle.   4. The mitral valve is normal in structure. No evidence of mitral valve  regurgitation. No evidence of mitral stenosis.   5. The aortic valve is tricuspid. There is mild calcification of the  aortic valve. There is mild thickening of the aortic valve. Aortic valve  regurgitation is not visualized. No aortic stenosis is present.   Comparison(s): Prior images unable to be directly viewed, comparison made  by report only. No significant change from prior study.   10/06/2018: stress myoview  The left ventricular ejection fraction is hyperdynamic (>65%). Nuclear stress EF: 67%. There was no ST segment deviation noted during stress. The study is normal. This is a low risk study. No prior study for comparison.   12/18/2009: TTE Study Conclusions   - Left ventricle: The cavity size was normal. Systolic function was     normal. The estimated ejection fraction was in the range of 60% to     65%. Although no diagnostic regional wall motion abnormality was     identified, this possibility cannot be completely excluded on the     basis of this study. Doppler parameters are consistent with     abnormal left ventricular relaxation (grade 1 diastolic     dysfunction).   - Right ventricle: The cavity size was normal. Wall thickness was at     the upper limits of normal.   - Pericardium, extracardiac: A trivial pericardial effusion was     identified.   Impressions:   No cardiac source of emboli was indentified.   Recommendations: Consider transesophageal echocardiography if   clinically indicated   Recent Labs: 02/01/2023: TSH 1.052 02/04/2023: ALT 23; BUN 21; Creatinine, Ser 1.02; Hemoglobin 11.8; Magnesium  1.8; Platelets 315; Potassium 3.5; Sodium 137  No results found for requested labs within last 365 days.   CrCl cannot be calculated (Patient's most recent lab  result is older than the maximum 21 days allowed.).   Wt Readings from Last 3 Encounters:  02/19/23 202 lb 1.6 oz (91.7 kg)  02/04/23 201 lb 11.5 oz (91.5 kg)  01/21/23 205 lb (93 kg)     Other studies reviewed: Additional studies/records reviewed today include: summarized above  ASSESSMENT AND  PLAN:  1. Prolonged QT     Reports compliance with her nadolol      *** QT looks OK  *** Reviewed her med list, re-discussed her trazodone , she again reports uses infrequently for sleep, rarely really, leaves her feeling a bit sedated I again discussed recommendation that she not use this and ask her PMD for an alternative without QT prolongation proprties  2. HTN     ***  3. HLD     PMD monitors and manages      Disposition: ***   Current medicines are reviewed at length with the patient today.  The patient did not have any concerns regarding medicines.  Bonney Charlies Arthur, PA-C 03/11/2023 7:30 AM     [facility] [address] [place] [address] [phone] (office)  [phone] (fax)

## 2023-03-23 ENCOUNTER — Ambulatory Visit (HOSPITAL_BASED_OUTPATIENT_CLINIC_OR_DEPARTMENT_OTHER): Payer: Medicare Other | Admitting: Pulmonary Disease

## 2023-03-24 ENCOUNTER — Ambulatory Visit (HOSPITAL_BASED_OUTPATIENT_CLINIC_OR_DEPARTMENT_OTHER): Payer: Medicare Other | Attending: Pulmonary Disease | Admitting: Physical Therapy

## 2023-03-24 ENCOUNTER — Encounter (HOSPITAL_BASED_OUTPATIENT_CLINIC_OR_DEPARTMENT_OTHER): Payer: Self-pay | Admitting: Physical Therapy

## 2023-03-24 ENCOUNTER — Other Ambulatory Visit: Payer: Self-pay

## 2023-03-24 DIAGNOSIS — M6281 Muscle weakness (generalized): Secondary | ICD-10-CM | POA: Insufficient documentation

## 2023-03-24 DIAGNOSIS — M5459 Other low back pain: Secondary | ICD-10-CM | POA: Diagnosis present

## 2023-03-24 DIAGNOSIS — R2681 Unsteadiness on feet: Secondary | ICD-10-CM | POA: Insufficient documentation

## 2023-03-24 DIAGNOSIS — M545 Low back pain, unspecified: Secondary | ICD-10-CM | POA: Diagnosis not present

## 2023-03-24 NOTE — Therapy (Signed)
OUTPATIENT PHYSICAL THERAPY THORACOLUMBAR EVALUATION   Patient Name: Becky [name] MRN: [id number] DOB:[birth year], 73 y.o., female Today's Date: 03/24/2023  END OF SESSION:  PT End of Session - 03/24/23 1326     Visit Number 1    Number of Visits 12    Date for PT Re-Evaluation 05/23/23    Authorization Type [employer] [place]    PT Start Time 1202    PT Stop Time 1240    PT Time Calculation (min) 38 min    Activity Tolerance Patient tolerated treatment well    Behavior During Therapy [facility] for tasks assessed/performed             Past Medical History:  Diagnosis Date   Chronic kidney disease    CVA (cerebral vascular accident) (HCC) 11/14/2013   Depression    followed by Dr Arletha Pili frequently 10/01/2015   Genital herpes    History of melanoma    followed by derm Dr Emily Filbert   Hyperlipidemia    Hypertension    MCI (mild cognitive impairment) 10/01/2015   Pure hypercholesterolemia 11/14/2013   Stroke (HCC)    Old stroke seen on MRI 12/2009, carotid doppler w minimal plaque, MRI off the head otherwise neg for acute disease.   Thyroid disease    hypothyroid. Followed by Dr Evelene Croon   TIA (transient ischemic attack) 8/08   followed by neurologist Dr Pearlean Brownie   Past Surgical History:  Procedure Laterality Date   ABDOMINAL HYSTERECTOMY     APPENDECTOMY     CHOLECYSTECTOMY     MELANOMA EXCISION     x2   Patient Active Problem List   Diagnosis Date Noted   Angioedema 02/01/2023   Chronic fatigue 09/11/2021   Shortness of breath at rest 08/17/2021   Moderate persistent asthma 05/06/2021   History of COVID-19 03/19/2021   COVID-19 long hauler manifesting chronic dyspnea 03/19/2021   Thyroid disease    Stroke [facility])    Hypertension    Hyperlipidemia    History of melanoma    Genital herpes    Depression    Chronic kidney disease    MCI (mild cognitive impairment) 10/01/2015   Falls frequently 10/01/2015   Essential hypertension, benign 11/14/2013   CVA  (cerebral vascular accident) (HCC) 11/14/2013   Pure hypercholesterolemia 11/14/2013   TIA (transient ischemic attack) 10/02/2006    PCP: Jackelyn Poling MD  REFERRING PROVIDER: Luciano Cutter, MD   REFERRING DIAG: M54.50 (ICD-10-CM) - Acute bilateral low back pain without sciatica   Rationale for Evaluation and Treatment: Rehabilitation  THERAPY DIAG:  Muscle weakness (generalized)  Unsteadiness on feet  Other low back pain  ONSET DATE: 3 months  SUBJECTIVE:  SUBJECTIVE STATEMENT: My back pain has come and gone for my back.  No known etiology.   Lasted for about 2 weeks. My biggest complaint is that I am so weak.   PERTINENT HISTORY:  Recert hospitalization due to allergic reaction to med.  Strengthening/deconditioning post hospital. Interested in pool therapy  CVA 2011  PAIN:  Are you having pain? No  PRECAUTIONS: None  RED FLAGS: None   WEIGHT BEARING RESTRICTIONS: No  FALLS:  Has patient fallen in last 6 months? No  LIVING ENVIRONMENT: Lives with: lives alone Lives in: House/apartment Stairs: No Has following equipment at home: Single point cane  OCCUPATION: retired  PLOF: Independent  PATIENT GOALS: improve strength, improve endurance and breathing  NEXT MD VISIT: tomorrow  OBJECTIVE:  Note: Objective measures were completed at Evaluation unless otherwise noted.  DIAGNOSTIC FINDINGS:  none  PATIENT SURVEYS:  FOTO Primary measure 94% with goal of 81%  COGNITION: Overall cognitive status: Within functional limits for tasks assessed     SENSATION: WFL  MUSCLE LENGTH: Hamstrings: wfl tightness L>R   POSTURE: slight rounded shoulders, slight forward head, and increased thoracic kyphosis  PALPATION: No TTP  LUMBAR ROM:   WFL  LOWER EXTREMITY ROM:      WFL  LOWER EXTREMITY MMT:    MMT Right eval Left eval  Hip flexion 39.0 35.8  Hip extension    Hip abduction 23.6 26.7  Hip adduction    Hip internal rotation    Hip external rotation    Knee flexion    Knee extension 28.3 31.3  Ankle dorsiflexion    Ankle plantarflexion    Ankle inversion    Ankle eversion     (Blank rows = not tested)  LUMBAR SPECIAL TESTS:  Slump test: Negative  FUNCTIONAL TESTS:  5 times sit to stand: 21.45 from pool bench Timed up and go (TUG): 28.03 Berg Balance Scale: 30/56  GAIT: Distance walked: 400 ft Assistive device utilized: Single point cane Level of assistance: Modified independence Comments: Slowed cadence use of cane  TREATMENT  Eval                                                                                                                              PATIENT EDUCATION:  Education details: Discussed eval findings, rehab rationale, aquatic program progression/POC and pools in area. Patient is in agreement  Person educated: Patient Education method: Explanation Education comprehension: verbalized understanding  HOME EXERCISE PROGRAM: TBA  ASSESSMENT:  CLINICAL IMPRESSION: Patient is a 73 y.o. f who was seen today for physical therapy evaluation and treatment for LBP which has since resolved from date of referral.  She reports main concern is residual general weakness from bout of LBP that lasted for a few weeks and long covid (2022).  She presents today with strength and balance deficits as indicated by objective and functional testing.  She report some slight SOB with distance walked from parkinglot and general fatigue.  She is a good candidate for skilled PT to improve all deficits which are decreasing her functional mobility and safety as well as QOL.  Will begin in aquatics then add land based for increased loading once initial strengthening has begun.  OBJECTIVE IMPAIRMENTS: Abnormal gait, decreased activity  tolerance, decreased balance, decreased endurance, decreased mobility, decreased strength, obesity, and pain.   ACTIVITY LIMITATIONS: carrying, lifting, stairs, transfers, and locomotion level  PARTICIPATION LIMITATIONS: shopping, community activity, and yard work  PERSONAL FACTORS: Age, Fitness, Past/current experiences, Time since onset of injury/illness/exacerbation, and 3+ comorbidities: see PmHx   are also affecting patient's functional outcome.   REHAB POTENTIAL: Good  CLINICAL DECISION MAKING: Evolving/moderate complexity  EVALUATION COMPLEXITY: Moderate   GOALS: Goals reviewed with patient? No  SHORT TERM GOALS: Target date: 05/23/23  Pt will tolerate full aquatic sessions consistently without increase in pain and with improving function to demonstrate good toleration and effectiveness of intervention.  Baseline: Goal status: INITIAL  2.  Pt will tolerate walking to and from setting along with completion of aquatic session without excessive fatigue or addition of pain Baseline:  Goal status: INITIAL  3.  Pt will improve on 5 X STS test to <or=17s ((MDC)  to demonstrate improving functional lower extremity strength, transitional movements, and balance Baseline: 21.45 Goal status: INITIAL  4.  Pt will improve on Berg balance test to >/= 45/56 to demonstrate a decrease in fall risk. Baseline: 30/56 Goal status: INITIAL  5.  Pt will improve on Tug test to <or=  20s to demonstrate improvement in lower extremity function, mobility and decreased fall risk. Baseline: 28.03 Goal status: INITIAL  6.  Pt will be indep with final HEP's (land and aquatic as appropriate) for continued management of condition Baseline: TBA Goal status: INITIAL  LONG TERM GOALS: will se at re-cert as approp  PLAN:  PT FREQUENCY: 1-2x/week  PT DURATION: 8 weeks 12 visits  PLANNED INTERVENTIONS: 97164- PT Re-evaluation, 97110-Therapeutic exercises, 97530- Therapeutic activity, 97112-  Neuromuscular re-education, 97535- Self Care, 29528- Manual therapy, [id number]- Gait training, [phone]- Orthotic Fit/training, [phone]- Aquatic Therapy, 97014- Electrical stimulation (unattended), [phone]- Ionotophoresis 4mg /ml Dexamethasone, Patient/Family education, Balance training, Stair training, Taping, Dry Needling, Joint mobilization, DME instructions, Cryotherapy, and Moist heat.  PLAN FOR NEXT SESSION: aquatic and land: general strengthening; balance and gait retraining; core stabilization; aerobic capacity training   Corrie Dandy Tomma Lightning) Alayssa Flinchum MPT 03/24/23 1:40 PM [facility] [address] [place], [place], [address] Phone: [phone]   Fax:  [phone]

## 2023-04-09 DIAGNOSIS — H43813 Vitreous degeneration, bilateral: Secondary | ICD-10-CM | POA: Diagnosis not present

## 2023-04-09 DIAGNOSIS — H34831 Tributary (branch) retinal vein occlusion, right eye, with macular edema: Secondary | ICD-10-CM | POA: Diagnosis not present

## 2023-04-09 DIAGNOSIS — H35033 Hypertensive retinopathy, bilateral: Secondary | ICD-10-CM | POA: Diagnosis not present

## 2023-04-09 DIAGNOSIS — H35371 Puckering of macula, right eye: Secondary | ICD-10-CM | POA: Diagnosis not present

## 2023-04-09 DIAGNOSIS — H04123 Dry eye syndrome of bilateral lacrimal glands: Secondary | ICD-10-CM | POA: Diagnosis not present

## 2023-04-10 DIAGNOSIS — H6691 Otitis media, unspecified, right ear: Secondary | ICD-10-CM | POA: Diagnosis not present

## 2023-04-10 DIAGNOSIS — R110 Nausea: Secondary | ICD-10-CM | POA: Diagnosis not present

## 2023-04-15 ENCOUNTER — Ambulatory Visit (HOSPITAL_BASED_OUTPATIENT_CLINIC_OR_DEPARTMENT_OTHER): Payer: Medicare Other | Admitting: Physical Therapy

## 2023-04-17 ENCOUNTER — Ambulatory Visit (HOSPITAL_BASED_OUTPATIENT_CLINIC_OR_DEPARTMENT_OTHER): Payer: No Typology Code available for payment source | Admitting: Physical Therapy

## 2023-04-19 ENCOUNTER — Encounter (HOSPITAL_BASED_OUTPATIENT_CLINIC_OR_DEPARTMENT_OTHER): Payer: Self-pay | Admitting: Physical Therapy

## 2023-04-20 DIAGNOSIS — N390 Urinary tract infection, site not specified: Secondary | ICD-10-CM | POA: Diagnosis not present

## 2023-04-20 DIAGNOSIS — B379 Candidiasis, unspecified: Secondary | ICD-10-CM | POA: Diagnosis not present

## 2023-04-20 DIAGNOSIS — R110 Nausea: Secondary | ICD-10-CM | POA: Diagnosis not present

## 2023-04-20 DIAGNOSIS — R0602 Shortness of breath: Secondary | ICD-10-CM | POA: Diagnosis not present

## 2023-04-21 ENCOUNTER — Ambulatory Visit (HOSPITAL_BASED_OUTPATIENT_CLINIC_OR_DEPARTMENT_OTHER): Payer: Medicare Other | Admitting: Pulmonary Disease

## 2023-04-21 ENCOUNTER — Ambulatory Visit (HOSPITAL_BASED_OUTPATIENT_CLINIC_OR_DEPARTMENT_OTHER): Payer: Medicare Other | Admitting: Physical Therapy

## 2023-04-23 ENCOUNTER — Ambulatory Visit (HOSPITAL_BASED_OUTPATIENT_CLINIC_OR_DEPARTMENT_OTHER): Payer: No Typology Code available for payment source | Admitting: Physical Therapy

## 2023-04-27 NOTE — Progress Notes (Deleted)
 .   Cardiology Office Note Date:  04/27/2023  Patient ID:  Becky [name], Becky [name] 03/06/1950, MRN [id number] PCP:  Jackelyn Poling, DO  Electrophysiologist:  Dr. Elberta Fortis   Chief Complaint:  *** overdue annual visit  History of Present Illness: [name] is a 73 y.o. female with history of HTN, HLD, CKD, stroke, TIA, hypothyroidism, prolonged QT.   She comes in today to be seen for Dr. Elberta Fortis.  Last seen by him 09/27/2018.  She was doing well, discussed some degree of SOB at rest and exertion.  She was taking nadolol, though currently had run out. Her medicine refilled, instructed to avoid QT prolonging medicines. Planned for stress test to evaluate her SOB, though low suspicion for anginal equivalent.  Stress was low risk.   I saw her Aug 2021 She is doing well She has a small dog that she walks 5x per day, takes about 10-31minutes stopping for the dog, she is unsure the length of the walk, but denies any exertional intolerances with her walks or her ADLs Denies any CP, palpitations or cardiac awareness NO SOB/DOE No dizzy spells, near syncope or syncope She had Trazodone that she used infrequently, urged not to with some Qt properties and see her PMD for an alternative  COVID in July  I saw her 01/29/21  She reports that her COVID infection was terrible and she felt awful for a long time, remains SOB with minimal exertion. She says she felt GREAT prior to getting sick and has never felt well again She denies CP, palpitations No near syncope or syncope. She is frustrated over the persistent SOB since COVID Discussed her Trazodone, rare use > advised she d/w her PMD an alternative without QT prolonging properties  Admitted June 2023 SOB > asthma exacerbation/long-haul COVID  Anxiety/ depression, home meds continued > OP psych follow up  Admitted 02/01/23 Tongue swelling/angioedema treated with EpiPen, Solu-Medrol, Pepcid and Benadryl and admitted to ICU  Felt to be  the cause and stopped  Nocturnal bradycardia mentioned, maintained on her nadolol  *** med list, QT   Past Medical History:  Diagnosis Date   Chronic kidney disease    CVA (cerebral vascular accident) (HCC) 11/14/2013   Depression    followed by Dr Arletha Pili frequently 10/01/2015   Genital herpes    History of melanoma    followed by derm Dr Emily Filbert   Hyperlipidemia    Hypertension    MCI (mild cognitive impairment) 10/01/2015   Pure hypercholesterolemia 11/14/2013   Stroke [facility])    Old stroke seen on MRI 12/2009, carotid doppler w minimal plaque, MRI off the head otherwise neg for acute disease.   Thyroid disease    hypothyroid. Followed by Dr Evelene Croon   TIA (transient ischemic attack) 8/08   followed by neurologist Dr Pearlean Brownie    Past Surgical History:  Procedure Laterality Date   ABDOMINAL HYSTERECTOMY     APPENDECTOMY     CHOLECYSTECTOMY     MELANOMA EXCISION     x2    Current Outpatient Medications  Medication Sig Dispense Refill   albuterol (PROVENTIL) (2.5 MG/3ML) 0.083% nebulizer solution USE 1 VIAL IN NEBULIZER EVERY 6 HOURS AS NEEDED FOR SHORTNESS OF BREATH AND FOR WHEEZING 225 mL 3   albuterol (VENTOLIN HFA) 108 (90 Base) MCG/ACT inhaler Inhale 1 puff into the lungs every 4 (four) hours as needed for wheezing. 8 g 2   ALPRAZolam (XANAX) 0.25 MG tablet Take 0.25 mg by mouth 3 (  three) times daily as needed for anxiety or sleep.  1   amLODipine (NORVASC) 10 MG tablet Take 1 tablet (10 mg total) by mouth daily. 30 tablet 2   ARIPiprazole (ABILIFY) 5 MG tablet Take 5 mg by mouth daily.     aspirin EC 325 MG tablet Take 650 mg by mouth at bedtime.     desvenlafaxine (PRISTIQ) 100 MG 24 hr tablet Take 100 mg by mouth daily.     Dexmethylphenidate HCl 40 MG CP24 Take 40 mg daily by mouth.     fluticasone-salmeterol (ADVAIR DISKUS) 500-50 MCG/ACT AEPB Inhale 1 puff into the lungs in the morning and at bedtime. 60 each 11   hydrochlorothiazide (HYDRODIURIL) 25 MG tablet Take 25  mg by mouth every morning.     levothyroxine (SYNTHROID) 50 MCG tablet Take 50 mcg by mouth daily before breakfast.     nadolol (CORGARD) 20 MG tablet Take 1 tablet (20 mg total) by mouth daily. Patient needs appt for # 90 supply or future refills. Please call office to schedule appt. 30 tablet 0   NONFORMULARY OR COMPOUNDED ITEM Place 1-4 drops into the right eye See admin instructions. Prednisolone 1% / Moxifloxacin 0.5% / Bromfenac 0.075% Opth Soln.  Instill 1 drop into the right eye four times daily for 4 days, then 1 drop twice a day for 2 days, then 1 drop once a day until finished.     OVER THE COUNTER MEDICATION Take 2 tablets by mouth daily as needed. Equate indigestion relief.     pramipexole (MIRAPEX) 0.25 MG tablet Take 0.5 mg by mouth at bedtime.  11   pravastatin (PRAVACHOL) 40 MG tablet Take 40 mg by mouth daily.     No current facility-administered medications for this visit.    Allergies:   Penicillins, Telmisartan, Iodinated contrast media, Iohexol, Latex, and Nsaids   Social History:  The patient  reports that she has never smoked. She has never used smokeless tobacco. She reports that she does not drink alcohol and does not use drugs.   Family History:  The patient's family history includes Breast cancer in her mother; Dementia in her maternal aunt; Heart attack in her father; Hyperlipidemia in her father and sister; Hypertension in her father and sister.  ROS:  Please see the history of present illness. All other systems are reviewed and otherwise negative.   PHYSICAL EXAM:  VS:  There were no vitals taken for this visit. BMI: There is no height or weight on file to calculate BMI. Well nourished, well developed, in no acute distress  HEENT: normocephalic, atraumatic  Neck: no JVD, carotid bruits or masses Cardiac: *** RRR; no significant murmurs, no rubs, or gallops Lungs: *** CTA b/l, no wheezing, rhonchi or rales  Abd: soft, nontender MS: no deformity or  atrophy Ext: *** no edema  Skin: warm and dry, no rash Neuro:  No gross deficits appreciated Psych: euthymic mood, full affect   EKG:  Done today and reviewed by myself shows  ***    08/18/2021: TTE  1. Left ventricular ejection fraction, by estimation, is 65 to 70%. The  left ventricle has normal function. The left ventricle has no regional  wall motion abnormalities. There is mild concentric left ventricular  hypertrophy. Left ventricular diastolic  parameters are consistent with Grade I diastolic dysfunction (impaired  relaxation). Elevated left ventricular end-diastolic pressure.   2. Right ventricular systolic function is normal. The right ventricular  size is normal. There is normal pulmonary artery  systolic pressure.   3. The mitral valve is normal in structure. Trivial mitral valve  regurgitation. No evidence of mitral stenosis.   4. The aortic valve is normal in structure. Aortic valve regurgitation is  not visualized. No aortic stenosis is present.   5. The inferior vena cava is normal in size with greater than 50%  respiratory variability, suggesting right atrial pressure of 3 mmHg.   10/06/2018: stress myoview The left ventricular ejection fraction is hyperdynamic (>65%). Nuclear stress EF: 67%. There was no ST segment deviation noted during stress. The study is normal. This is a low risk study. No prior study for comparison.   12/18/2009: TTE Study Conclusions   - Left ventricle: The cavity size was normal. Systolic function was     normal. The estimated ejection fraction was in the range of 60% to     65%. Although no diagnostic regional wall motion abnormality was     identified, this possibility cannot be completely excluded on the     basis of this study. Doppler parameters are consistent with     abnormal left ventricular relaxation (grade 1 diastolic     dysfunction).   - Right ventricle: The cavity size was normal. Wall thickness was at     the upper  limits of normal.   - Pericardium, extracardiac: A trivial pericardial effusion was     identified.   Impressions:   No cardiac source of emboli was indentified.   Recommendations: Consider transesophageal echocardiography if   clinically indicated   Recent Labs: 02/01/2023: TSH 1.052 02/04/2023: ALT 23; BUN 21; Creatinine, Ser 1.02; Hemoglobin 11.8; Magnesium 1.8; Platelets 315; Potassium 3.5; Sodium 137  No results found for requested labs within last 365 days.   CrCl cannot be calculated (Patient's most recent lab result is older than the maximum 21 days allowed.).   Wt Readings from Last 3 Encounters:  02/19/23 202 lb 1.6 oz (91.7 kg)  02/04/23 201 lb 11.5 oz (91.5 kg)  01/21/23 205 lb (93 kg)     Other studies reviewed: Additional studies/records reviewed today include: summarized above  ASSESSMENT AND PLAN:  1. Prolonged QT     *** Reports compliance with her nadolol     *** QT looks OK  *** Reviewed her med list, re-discussed her trazodone, she again reports uses infrequently for sleep, rarely really, leaves her feeling a bit sedated I again discussed recommendation that she not use this and ask her PMD for an alternative without QT prolongation proprties   2. HTN     ***  3. HLD     PMD monitors and manages       Disposition: ***  Current medicines are reviewed at length with the patient today.  The patient did not have any concerns regarding medicines.  Norma Fredrickson, PA-C 04/27/2023 9:35 AM     [facility] [address] [place] [address] [phone] (office)  [phone] (fax)

## 2023-04-28 ENCOUNTER — Ambulatory Visit (HOSPITAL_BASED_OUTPATIENT_CLINIC_OR_DEPARTMENT_OTHER): Payer: Medicare Other | Admitting: Physical Therapy

## 2023-04-28 DIAGNOSIS — I10 Essential (primary) hypertension: Secondary | ICD-10-CM | POA: Diagnosis not present

## 2023-04-28 DIAGNOSIS — N183 Chronic kidney disease, stage 3 unspecified: Secondary | ICD-10-CM | POA: Diagnosis not present

## 2023-04-28 DIAGNOSIS — N3000 Acute cystitis without hematuria: Secondary | ICD-10-CM | POA: Diagnosis not present

## 2023-04-28 DIAGNOSIS — E7800 Pure hypercholesterolemia, unspecified: Secondary | ICD-10-CM | POA: Diagnosis not present

## 2023-04-28 DIAGNOSIS — M8580 Other specified disorders of bone density and structure, unspecified site: Secondary | ICD-10-CM | POA: Diagnosis not present

## 2023-04-28 DIAGNOSIS — Z0000 Encounter for general adult medical examination without abnormal findings: Secondary | ICD-10-CM | POA: Diagnosis not present

## 2023-04-28 DIAGNOSIS — E039 Hypothyroidism, unspecified: Secondary | ICD-10-CM | POA: Diagnosis not present

## 2023-04-29 ENCOUNTER — Ambulatory Visit: Payer: Medicare Other | Admitting: Physician Assistant

## 2023-04-30 ENCOUNTER — Ambulatory Visit (HOSPITAL_BASED_OUTPATIENT_CLINIC_OR_DEPARTMENT_OTHER): Payer: Medicare Other | Attending: Pulmonary Disease | Admitting: Physical Therapy

## 2023-04-30 ENCOUNTER — Encounter (HOSPITAL_BASED_OUTPATIENT_CLINIC_OR_DEPARTMENT_OTHER): Payer: Self-pay | Admitting: Physical Therapy

## 2023-04-30 DIAGNOSIS — M5459 Other low back pain: Secondary | ICD-10-CM | POA: Insufficient documentation

## 2023-04-30 DIAGNOSIS — R2681 Unsteadiness on feet: Secondary | ICD-10-CM | POA: Insufficient documentation

## 2023-04-30 DIAGNOSIS — M6281 Muscle weakness (generalized): Secondary | ICD-10-CM | POA: Insufficient documentation

## 2023-04-30 DIAGNOSIS — R2689 Other abnormalities of gait and mobility: Secondary | ICD-10-CM | POA: Diagnosis not present

## 2023-04-30 NOTE — Therapy (Signed)
 OUTPATIENT PHYSICAL THERAPY THORACOLUMBAR EVALUATION   Patient Name: Becky [name] MRN: [id number] DOB:September 18, 1950, 73 y.o., female Today's Date: 04/30/2023  END OF SESSION:  PT End of Session - 04/30/23 1441     Visit Number 2    Number of Visits 12    Date for PT Re-Evaluation 05/23/23    Authorization Type devoted health [place]    PT Start Time 1358    PT Stop Time 1440    PT Time Calculation (min) 42 min    Activity Tolerance Patient tolerated treatment well    Behavior During Therapy [facility] for tasks assessed/performed             Past Medical History:  Diagnosis Date   Chronic kidney disease    CVA (cerebral vascular accident) (HCC) 11/14/2013   Depression    followed by Dr Arletha Pili frequently 10/01/2015   Genital herpes    History of melanoma    followed by derm Dr Emily Filbert   Hyperlipidemia    Hypertension    MCI (mild cognitive impairment) 10/01/2015   Pure hypercholesterolemia 11/14/2013   Stroke (HCC)    Old stroke seen on MRI 12/2009, carotid doppler w minimal plaque, MRI off the head otherwise neg for acute disease.   Thyroid disease    hypothyroid. Followed by Dr Evelene Croon   TIA (transient ischemic attack) 8/08   followed by neurologist Dr Pearlean Brownie   Past Surgical History:  Procedure Laterality Date   ABDOMINAL HYSTERECTOMY     APPENDECTOMY     CHOLECYSTECTOMY     MELANOMA EXCISION     x2   Patient Active Problem List   Diagnosis Date Noted   Angioedema 02/01/2023   Chronic fatigue 09/11/2021   Shortness of breath at rest 08/17/2021   Moderate persistent asthma 05/06/2021   History of COVID-19 03/19/2021   COVID-19 long hauler manifesting chronic dyspnea 03/19/2021   Thyroid disease    Stroke [facility])    Hypertension    Hyperlipidemia    History of melanoma    Genital herpes    Depression    Chronic kidney disease    MCI (mild cognitive impairment) 10/01/2015   Falls frequently 10/01/2015   Essential hypertension, benign 11/14/2013   CVA  (cerebral vascular accident) (HCC) 11/14/2013   Pure hypercholesterolemia 11/14/2013   TIA (transient ischemic attack) 10/02/2006    PCP: Jackelyn Poling MD  REFERRING PROVIDER: Luciano Cutter, MD   REFERRING DIAG: M54.50 (ICD-10-CM) - Acute bilateral low back pain without sciatica   Rationale for Evaluation and Treatment: Rehabilitation  THERAPY DIAG:  Muscle weakness (generalized)  Unsteadiness on feet  Other low back pain  Other abnormalities of gait and mobility  ONSET DATE: 3 months  SUBJECTIVE:  SUBJECTIVE STATEMENT: My back pain has come and gone for my back.  No known etiology.   Lasted for about 2 weeks. My biggest complaint is that I am so weak.   PERTINENT HISTORY:  Recert hospitalization due to allergic reaction to med.  Strengthening/deconditioning post hospital. Interested in pool therapy  CVA 2011  PAIN:  Are you having pain? No  PRECAUTIONS: None  RED FLAGS: None   WEIGHT BEARING RESTRICTIONS: No  FALLS:  Has patient fallen in last 6 months? No  LIVING ENVIRONMENT: Lives with: lives alone Lives in: House/apartment Stairs: No Has following equipment at home: Single point cane  OCCUPATION: retired  PLOF: Independent  PATIENT GOALS: improve strength, improve endurance and breathing  NEXT MD VISIT: tomorrow  OBJECTIVE:  Note: Objective measures were completed at Evaluation unless otherwise noted.  DIAGNOSTIC FINDINGS:  none  PATIENT SURVEYS:  FOTO Primary measure 94% with goal of 81%  COGNITION: Overall cognitive status: Within functional limits for tasks assessed     SENSATION: WFL  MUSCLE LENGTH: Hamstrings: wfl tightness L>R   POSTURE: slight rounded shoulders, slight forward head, and increased thoracic kyphosis  PALPATION: No  TTP  LUMBAR ROM:   WFL  LOWER EXTREMITY ROM:     WFL  LOWER EXTREMITY MMT:    MMT Right eval Left eval  Hip flexion 39.0 35.8  Hip extension    Hip abduction 23.6 26.7  Hip adduction    Hip internal rotation    Hip external rotation    Knee flexion    Knee extension 28.3 31.3  Ankle dorsiflexion    Ankle plantarflexion    Ankle inversion    Ankle eversion     (Blank rows = not tested)  LUMBAR SPECIAL TESTS:  Slump test: Negative  FUNCTIONAL TESTS:  5 times sit to stand: 21.45 from pool bench Timed up and go (TUG): 28.03 Berg Balance Scale: 30/56  GAIT: Distance walked: 400 ft Assistive device utilized: Single point cane Level of assistance: Modified independence Comments: Slowed cadence use of cane  TREATMENT  Self care: pt instruction and edu on increasing walking activities at home; discussed benefits of pool therapy;completing land based intervention as tolerated ; encouraging comfort level for progressing towards goals.  Pt schedule changed to reflect just land based intervention  Nu-step 2 minute intervals with 1-2 minute rest period between x 20 minutes.  Pt monitored throughout to ensure approp exertion level not exceeded                                                                                                                           PATIENT EDUCATION:  Education details: Discussed eval findings, rehab rationale, aquatic program progression/POC and pools in area. Patient is in agreement  Person educated: Patient Education method: Explanation Education comprehension: verbalized understanding  HOME EXERCISE PROGRAM: TBA  ASSESSMENT:  CLINICAL IMPRESSION: Pt returns to therapy after short pause due to being ill.  She prefers no aquatic intervention today  and with conversation she has decided against aquatics until the end of this cert as she is not comfort level with submerging at this time.  She reports she continues to be very weak and is  concerned that she will continue to get weaker if she doesn't start moving.  She completes some land based intervention today using the Nu step with multiple rest periods.  She tolerates well stating it felt good to move.  Will plan on seeing her land based for the next few weeks and the trying some aquatic later when she feel as though she is ready.   Initial impression Patient is a 73 y.o. f who was seen today for physical therapy evaluation and treatment for LBP which has since resolved from date of referral.  She reports main concern is residual general weakness from bout of LBP that lasted for a few weeks and long covid (2022).  She presents today with strength and balance deficits as indicated by objective and functional testing.  She report some slight SOB with distance walked from parkinglot and general fatigue.  She is a good candidate for skilled PT to improve all deficits which are decreasing her functional mobility and safety as well as QOL.  Will begin in aquatics then add land based for increased loading once initial strengthening has begun.  OBJECTIVE IMPAIRMENTS: Abnormal gait, decreased activity tolerance, decreased balance, decreased endurance, decreased mobility, decreased strength, obesity, and pain.   ACTIVITY LIMITATIONS: carrying, lifting, stairs, transfers, and locomotion level  PARTICIPATION LIMITATIONS: shopping, community activity, and yard work  PERSONAL FACTORS: Age, Fitness, Past/current experiences, Time since onset of injury/illness/exacerbation, and 3+ comorbidities: see PmHx   are also affecting patient's functional outcome.   REHAB POTENTIAL: Good  CLINICAL DECISION MAKING: Evolving/moderate complexity  EVALUATION COMPLEXITY: Moderate   GOALS: Goals reviewed with patient? No  SHORT TERM GOALS: Target date: 05/23/23  Pt will tolerate full aquatic sessions consistently without increase in pain and with improving function to demonstrate good toleration and  effectiveness of intervention.  Baseline: Goal status: INITIAL  2.  Pt will tolerate walking to and from setting along with completion of aquatic session without excessive fatigue or addition of pain Baseline:  Goal status: INITIAL  3.  Pt will improve on 5 X STS test to <or=17s ((MDC)  to demonstrate improving functional lower extremity strength, transitional movements, and balance Baseline: 21.45 Goal status: INITIAL  4.  Pt will improve on Berg balance test to >/= 45/56 to demonstrate a decrease in fall risk. Baseline: 30/56 Goal status: INITIAL  5.  Pt will improve on Tug test to <or=  20s to demonstrate improvement in lower extremity function, mobility and decreased fall risk. Baseline: 28.03 Goal status: INITIAL  6.  Pt will be indep with final HEP's (land and aquatic as appropriate) for continued management of condition Baseline: TBA Goal status: INITIAL  LONG TERM GOALS: will se at re-cert as approp  PLAN:  PT FREQUENCY: 1-2x/week  PT DURATION: 8 weeks 12 visits  PLANNED INTERVENTIONS: 97164- PT Re-evaluation, 97110-Therapeutic exercises, 97530- Therapeutic activity, 97112- Neuromuscular re-education, 97535- Self Care, 16109- Manual therapy, [id number]- Gait training, [phone]- Orthotic Fit/training, [phone]- Aquatic Therapy, 97014- Electrical stimulation (unattended), [phone]- Ionotophoresis 4mg /ml Dexamethasone, Patient/Family education, Balance training, Stair training, Taping, Dry Needling, Joint mobilization, DME instructions, Cryotherapy, and Moist heat.  PLAN FOR NEXT SESSION: aquatic and land: general strengthening; balance and gait retraining; core stabilization; aerobic capacity training   Corrie Dandy Tomma Lightning) Abdulkareem Badolato MPT 04/30/23 6:15 PM  [facility] [address] [place], [place], [address] Phone: [phone]   Fax:  [phone]

## 2023-05-05 ENCOUNTER — Telehealth: Payer: Self-pay | Admitting: Cardiology

## 2023-05-05 ENCOUNTER — Ambulatory Visit (HOSPITAL_BASED_OUTPATIENT_CLINIC_OR_DEPARTMENT_OTHER): Payer: Medicare Other | Admitting: Physical Therapy

## 2023-05-05 NOTE — Telephone Encounter (Signed)
 Spoke with patient and she states she has been having dizzy spells and some SOB at times. Her BP and HR has been normal for her. She has no readings to give me. She has not been seen on over 2 years. OV scheduled ED precautions discussed

## 2023-05-05 NOTE — Telephone Encounter (Signed)
 Patient states she has been experiencing dizzy spells on and off, but for the last week or so, it has been more extensive. She has a therapy appt today at [facility], but d/t the spells, she felt as if she should cancel because she didn't feel well enough. She has checked her BP/HR - she states it has been fine. She reports lightheadedness, has not passed out. She states her legs feel funny. At this time she begins to report SOB.   Pt c/o Shortness Of Breath: STAT if SOB developed within the last 24 hours or pt is noticeably SOB on the phone  1. Are you currently SOB (can you hear that pt is SOB on the phone)? A little bit, states she has been all day   2. How long have you been experiencing SOB? Since she had covid 3 years, says it gets better and then it'll start up again.  3. Are you SOB when sitting or when up moving around? Doesn't matter what she's doing  4. Are you currently experiencing any other symptoms? No    Patient did come to her appt with EP APP on 2/26 - she is rescheduled for 3/19.   Call transferred to triage.

## 2023-05-07 ENCOUNTER — Encounter (HOSPITAL_BASED_OUTPATIENT_CLINIC_OR_DEPARTMENT_OTHER): Payer: No Typology Code available for payment source

## 2023-05-07 ENCOUNTER — Ambulatory Visit (HOSPITAL_BASED_OUTPATIENT_CLINIC_OR_DEPARTMENT_OTHER): Payer: No Typology Code available for payment source | Admitting: Physical Therapy

## 2023-05-07 ENCOUNTER — Ambulatory Visit (HOSPITAL_BASED_OUTPATIENT_CLINIC_OR_DEPARTMENT_OTHER): Payer: Self-pay | Admitting: Physical Therapy

## 2023-05-08 ENCOUNTER — Other Ambulatory Visit (HOSPITAL_COMMUNITY): Payer: Self-pay

## 2023-05-08 ENCOUNTER — Telehealth: Payer: Self-pay | Admitting: Pulmonary Disease

## 2023-05-08 NOTE — Telephone Encounter (Signed)
 Given her dizziness, Lightheadednes, leg feel funny make sure she does not have focal deficits -if concerns or unsure please have her go to the ER for evaluation of stroke/TIA  Given shortness of breath please make sure that she has not had any prolonged immobilization such as car rides plane rides, surgeries,or prior history of DVT/PE. If progrossive or concerning please go to ED as well.  I will see her as new patient on 3/12.   Becky Toro [place], DO, [facility]

## 2023-05-08 NOTE — Telephone Encounter (Signed)
 Can you advise on change?

## 2023-05-08 NOTE — Telephone Encounter (Signed)
 Attempted to call patient. No answer. Left voicemail identifying clinic and callback number and that we will send a mychart message to address her question or she can call back.   If she calls back, please convey the mychart message:  "[name],  I received a call requesting to be on a stronger inhaler. You are currently taking the highest dose available for Advair with a strength of 500-50 mcg.   What we can do is add an additional inhaler called Spiriva. You would take this TWO puffs ONCE a day. The price check in the [facility] estimates it will be ~$57.28.  Please let me know if you would like me to order this additional inhaler for you to take daily."

## 2023-05-08 NOTE — Telephone Encounter (Signed)
 Spoke with pt and she denied any focal deficits that could signify a stroke/ TIA and also denied any prolonged immobilization of hx of DVT/PE. Pt stated she had been dx with long covid and has since had pulmonary issues and is seen by Dr. Everardo All at [facility]. Pt had stated that she had discussed the dizziness with her PCP and he had told her to call back if the dizziness seemed to continue and no resolved. Pt gave recent BP readings from the last couple of days and stated these readings were during the dizziness/ lightheadedness episodes:  132/74 122/51 121/63 158/84  Pt denied any CP or palpitations during these episodes. Pt advised to contact PCP and to let them know she is still having the dizziness episodes. Advised that if symptoms gets worse before discussing with PCP to go to an urgent care/ ED to be evaluated. Pt verbalized understanding of plan and had no further questions at this time. Pt will be seeing Dr. Odis Hollingshead on 05/13/23.

## 2023-05-08 NOTE — Telephone Encounter (Signed)
 Patient is on Advair and would like something stronger.  Pharmacy: [facility]

## 2023-05-12 ENCOUNTER — Encounter (HOSPITAL_BASED_OUTPATIENT_CLINIC_OR_DEPARTMENT_OTHER): Payer: Self-pay

## 2023-05-12 ENCOUNTER — Encounter (HOSPITAL_BASED_OUTPATIENT_CLINIC_OR_DEPARTMENT_OTHER): Payer: No Typology Code available for payment source

## 2023-05-12 ENCOUNTER — Ambulatory Visit (HOSPITAL_BASED_OUTPATIENT_CLINIC_OR_DEPARTMENT_OTHER): Payer: No Typology Code available for payment source | Admitting: Physical Therapy

## 2023-05-12 ENCOUNTER — Ambulatory Visit (HOSPITAL_BASED_OUTPATIENT_CLINIC_OR_DEPARTMENT_OTHER): Payer: Self-pay | Attending: Pulmonary Disease

## 2023-05-12 DIAGNOSIS — R2681 Unsteadiness on feet: Secondary | ICD-10-CM | POA: Diagnosis not present

## 2023-05-12 DIAGNOSIS — M6281 Muscle weakness (generalized): Secondary | ICD-10-CM | POA: Diagnosis not present

## 2023-05-12 DIAGNOSIS — R2689 Other abnormalities of gait and mobility: Secondary | ICD-10-CM | POA: Diagnosis not present

## 2023-05-12 DIAGNOSIS — M5459 Other low back pain: Secondary | ICD-10-CM | POA: Insufficient documentation

## 2023-05-12 NOTE — Therapy (Signed)
 OUTPATIENT PHYSICAL THERAPY THORACOLUMBAR TREATMENT   Patient Name: Becky [name] MRN: [id number] DOB:[birth year], 73 y.o., female Today's Date: 05/12/2023  END OF SESSION:  PT End of Session - 05/12/23 1510     Visit Number 3    Number of Visits 12    Date for PT Re-Evaluation 05/23/23    Authorization Type devoted health [place]    PT Start Time 1519    PT Stop Time 1600    PT Time Calculation (min) 41 min    Activity Tolerance Patient tolerated treatment well    Behavior During Therapy [facility] for tasks assessed/performed             Past Medical History:  Diagnosis Date   Chronic kidney disease    CVA (cerebral vascular accident) (HCC) 11/14/2013   Depression    followed by Dr Arletha Pili frequently 10/01/2015   Genital herpes    History of melanoma    followed by derm Dr Emily Filbert   Hyperlipidemia    Hypertension    MCI (mild cognitive impairment) 10/01/2015   Pure hypercholesterolemia 11/14/2013   Stroke (HCC)    Old stroke seen on MRI 12/2009, carotid doppler w minimal plaque, MRI off the head otherwise neg for acute disease.   Thyroid disease    hypothyroid. Followed by Dr Evelene Croon   TIA (transient ischemic attack) 8/08   followed by neurologist Dr Pearlean Brownie   Past Surgical History:  Procedure Laterality Date   ABDOMINAL HYSTERECTOMY     APPENDECTOMY     CHOLECYSTECTOMY     MELANOMA EXCISION     x2   Patient Active Problem List   Diagnosis Date Noted   Angioedema 02/01/2023   Chronic fatigue 09/11/2021   Shortness of breath at rest 08/17/2021   Moderate persistent asthma 05/06/2021   History of COVID-19 03/19/2021   COVID-19 long hauler manifesting chronic dyspnea 03/19/2021   Thyroid disease    Stroke [facility])    Hypertension    Hyperlipidemia    History of melanoma    Genital herpes    Depression    Chronic kidney disease    MCI (mild cognitive impairment) 10/01/2015   Falls frequently 10/01/2015   Essential hypertension, benign 11/14/2013   CVA  (cerebral vascular accident) (HCC) 11/14/2013   Pure hypercholesterolemia 11/14/2013   TIA (transient ischemic attack) 10/02/2006    PCP: Jackelyn Poling MD  REFERRING PROVIDER: Luciano Cutter, MD   REFERRING DIAG: M54.50 (ICD-10-CM) - Acute bilateral low back pain without sciatica   Rationale for Evaluation and Treatment: Rehabilitation  THERAPY DIAG:  Muscle weakness (generalized)  Unsteadiness on feet  Other low back pain  Other abnormalities of gait and mobility  ONSET DATE: 3 months  SUBJECTIVE:  SUBJECTIVE STATEMENT: No back pain at entry. She reports I "comes and goes." Unsure of any triggers.   PERTINENT HISTORY:  Recert hospitalization due to allergic reaction to med.  Strengthening/deconditioning post hospital. Interested in pool therapy  CVA 2011  PAIN:  Are you having pain? No  PRECAUTIONS: None  RED FLAGS: None   WEIGHT BEARING RESTRICTIONS: No  FALLS:  Has patient fallen in last 6 months? No  LIVING ENVIRONMENT: Lives with: lives alone Lives in: House/apartment Stairs: No Has following equipment at home: Single point cane  OCCUPATION: retired  PLOF: Independent  PATIENT GOALS: improve strength, improve endurance and breathing  NEXT MD VISIT: tomorrow  OBJECTIVE:  Note: Objective measures were completed at Evaluation unless otherwise noted.  DIAGNOSTIC FINDINGS:  none  PATIENT SURVEYS:  FOTO Primary measure 94% with goal of 81%  COGNITION: Overall cognitive status: Within functional limits for tasks assessed     SENSATION: WFL  MUSCLE LENGTH: Hamstrings: wfl tightness L>R   POSTURE: slight rounded shoulders, slight forward head, and increased thoracic kyphosis  PALPATION: No TTP  LUMBAR ROM:   WFL  LOWER EXTREMITY ROM:      WFL  LOWER EXTREMITY MMT:    MMT Right eval Left eval  Hip flexion 39.0 35.8  Hip extension    Hip abduction 23.6 26.7  Hip adduction    Hip internal rotation    Hip external rotation    Knee flexion    Knee extension 28.3 31.3  Ankle dorsiflexion    Ankle plantarflexion    Ankle inversion    Ankle eversion     (Blank rows = not tested)  LUMBAR SPECIAL TESTS:  Slump test: Negative  FUNCTIONAL TESTS:  5 times sit to stand: 21.45 from pool bench Timed up and go (TUG): 28.03 Berg Balance Scale: 30/56  GAIT: Distance walked: 400 ft Assistive device utilized: Single point cane Level of assistance: Modified independence Comments: Slowed cadence use of cane  TREATMENT   3/11 **2 pillows under head with supine** LTR x10ea 3" hold Hooklying PPT 3" x10 Hooklying marches x10ea with TrA Hooklying clams GTB 2x10 Nu-step L 3 x71min RPE level 5  End of session: 64bpm 95-96%O2 121/48mmHg     Previous: Self care: pt instruction and edu on increasing walking activities at home; discussed benefits of pool therapy;completing land based intervention as tolerated ; encouraging comfort level for progressing towards goals.  Pt schedule changed to reflect just land based intervention  Nu-step 2 minute intervals with 1-2 minute rest period between x 20 minutes.  Pt monitored throughout to ensure approp exertion level not exceeded                                                                                                                           PATIENT EDUCATION:  Education details: Discussed eval findings, rehab rationale, aquatic program progression/POC and pools in area. Patient is in agreement  Person educated: Patient Education method: Explanation Education comprehension: verbalized  understanding  HOME EXERCISE PROGRAM: TBA  ASSESSMENT:  CLINICAL IMPRESSION: Good tolerance for gentle lumbopelvic stretching and strengthening. Continued with nu-step for  conditioning and strengthening. Monitored breathing and exertion level throughout session. Pt did feel light headed following nu-step performance at end of session, which she attributed to her breathing. Vitals showed O2% at 95-96% range. BP and HR in normal ranges, but will continue to monitor at future sessions. Pt reports light headedness is a frequent occurrence with exertion.   Initial impression Patient is a 73 y.o. f who was seen today for physical therapy evaluation and treatment for LBP which has since resolved from date of referral.  She reports main concern is residual general weakness from bout of LBP that lasted for a few weeks and long covid (2022).  She presents today with strength and balance deficits as indicated by objective and functional testing.  She report some slight SOB with distance walked from parkinglot and general fatigue.  She is a good candidate for skilled PT to improve all deficits which are decreasing her functional mobility and safety as well as QOL.  Will begin in aquatics then add land based for increased loading once initial strengthening has begun.  OBJECTIVE IMPAIRMENTS: Abnormal gait, decreased activity tolerance, decreased balance, decreased endurance, decreased mobility, decreased strength, obesity, and pain.   ACTIVITY LIMITATIONS: carrying, lifting, stairs, transfers, and locomotion level  PARTICIPATION LIMITATIONS: shopping, community activity, and yard work  PERSONAL FACTORS: Age, Fitness, Past/current experiences, Time since onset of injury/illness/exacerbation, and 3+ comorbidities: see PmHx   are also affecting patient's functional outcome.   REHAB POTENTIAL: Good  CLINICAL DECISION MAKING: Evolving/moderate complexity  EVALUATION COMPLEXITY: Moderate   GOALS: Goals reviewed with patient? No  SHORT TERM GOALS: Target date: 05/23/23  Pt will tolerate full aquatic sessions consistently without increase in pain and with improving function to  demonstrate good toleration and effectiveness of intervention.  Baseline: Goal status: INITIAL  2.  Pt will tolerate walking to and from setting along with completion of aquatic session without excessive fatigue or addition of pain Baseline:  Goal status: INITIAL  3.  Pt will improve on 5 X STS test to <or=17s ((MDC)  to demonstrate improving functional lower extremity strength, transitional movements, and balance Baseline: 21.45 Goal status: INITIAL  4.  Pt will improve on Berg balance test to >/= 45/56 to demonstrate a decrease in fall risk. Baseline: 30/56 Goal status: INITIAL  5.  Pt will improve on Tug test to <or=  20s to demonstrate improvement in lower extremity function, mobility and decreased fall risk. Baseline: 28.03 Goal status: INITIAL  6.  Pt will be indep with final HEP's (land and aquatic as appropriate) for continued management of condition Baseline: TBA Goal status: INITIAL  LONG TERM GOALS: will se at re-cert as approp  PLAN:  PT FREQUENCY: 1-2x/week  PT DURATION: 8 weeks 12 visits  PLANNED INTERVENTIONS: 97164- PT Re-evaluation, 97110-Therapeutic exercises, 97530- Therapeutic activity, 97112- Neuromuscular re-education, 97535- Self Care, 47829- Manual therapy, [id number]- Gait training, [phone]- Orthotic Fit/training, [phone]- Aquatic Therapy, 97014- Electrical stimulation (unattended), [phone]- Ionotophoresis 4mg /ml Dexamethasone, Patient/Family education, Balance training, Stair training, Taping, Dry Needling, Joint mobilization, DME instructions, Cryotherapy, and Moist heat.  PLAN FOR NEXT SESSION: aquatic and land: general strengthening; balance and gait retraining; core stabilization; aerobic capacity training   Riki Altes, PTA  05/12/23 5:18 PM [facility] [address] [place], [place], [address] Phone: [phone]   Fax:  [phone]

## 2023-05-13 ENCOUNTER — Ambulatory Visit: Attending: Cardiology | Admitting: Cardiology

## 2023-05-13 ENCOUNTER — Encounter: Payer: Self-pay | Admitting: Cardiology

## 2023-05-13 VITALS — BP 133/77 | HR 59 | Ht 60.0 in

## 2023-05-13 DIAGNOSIS — G459 Transient cerebral ischemic attack, unspecified: Secondary | ICD-10-CM

## 2023-05-13 DIAGNOSIS — I10 Essential (primary) hypertension: Secondary | ICD-10-CM | POA: Diagnosis not present

## 2023-05-13 DIAGNOSIS — R0602 Shortness of breath: Secondary | ICD-10-CM | POA: Diagnosis not present

## 2023-05-13 DIAGNOSIS — R9431 Abnormal electrocardiogram [ECG] [EKG]: Secondary | ICD-10-CM

## 2023-05-13 DIAGNOSIS — R42 Dizziness and giddiness: Secondary | ICD-10-CM

## 2023-05-13 DIAGNOSIS — I639 Cerebral infarction, unspecified: Secondary | ICD-10-CM | POA: Diagnosis not present

## 2023-05-13 DIAGNOSIS — E7800 Pure hypercholesterolemia, unspecified: Secondary | ICD-10-CM | POA: Diagnosis not present

## 2023-05-13 NOTE — Patient Instructions (Signed)
 Medication Instructions:  Your physician has recommended you make the following change in your medication:   STOP Amlodipine *IF your systolic blood pressure (top number) is greater than 140, take Amlodipine 5 mg at night   *TAKE hydrochlorothiazide in the MORNING only  *If you need a refill on your cardiac medications before your next appointment, please call your pharmacy*  Lab Work: To be completed today: BMP, Pro-BNP, d-dimer, hemoglobin/hematocrit  If you have labs (blood work) drawn today and your tests are completely normal, you will receive your results only by: MyChart Message (if you have MyChart) OR A paper copy in the mail If you have any lab test that is abnormal or we need to change your treatment, we will call you to review the results.  Testing/Procedures: Your physician has requested that you have an echocardiogram. Echocardiography is a painless test that uses sound waves to create images of your heart. It provides your doctor with information about the size and shape of your heart and how well your heart's chambers and valves are working. This procedure takes approximately one hour. There are no restrictions for this procedure. Please do NOT wear cologne, perfume, aftershave, or lotions (deodorant is allowed). Please arrive 15 minutes prior to your appointment time.  Please note: We ask at that you not bring children with you during ultrasound (echo/ vascular) testing. Due to room size and safety concerns, children are not allowed in the ultrasound rooms during exams. Our front office staff cannot provide observation of children in our lobby area while testing is being conducted. An adult accompanying a patient to their appointment will only be allowed in the ultrasound room at the discretion of the ultrasound technician under special circumstances. We apologize for any inconvenience.   Your physician has requested that you have a lexiscan myoview. For further information  please visit [email or web address] Please follow instruction sheet, as given.   Follow-Up: At [facility], you and your health needs are our priority.  As part of our continuing mission to provide you with exceptional heart care, we have created designated Provider Care Teams.  These Care Teams include your primary Cardiologist (physician) and Advanced Practice Providers (APPs -  Physician Assistants and Nurse Practitioners) who all work together to provide you with the care you need, when you need it.  We recommend signing up for the patient portal called "MyChart".  Sign up information is provided on this After Visit Summary.  MyChart is used to connect with patients for Virtual Visits (Telemedicine).  Patients are able to view lab/test results, encounter notes, upcoming appointments, etc.  Non-urgent messages can be sent to your provider as well.   To learn more about what you can do with MyChart, go to [email or web address].    Your next appointment:   6 month(s)  The format for your next appointment:   In Person  Provider:   Tessa Lerner, DO {  Other Instructions Lexiscan Myoview (Stress Test) Instructions  Please arrive 15 minutes prior to your appointment time for registration and insurance purposes.   The test will take approximately 3 to 4 hours to complete; you may bring reading material.  If someone comes with you to your appointment, they will need to remain in the main lobby due to limited space in the testing area. **If you are pregnant or breastfeeding, please notify the nuclear lab prior to your appointment**   How to prepare for your Myocardial Perfusion Test: Do not eat or drink 3 hours prior to  your test, except you may have water. Do not consume products containing caffeine (regular or decaffeinated) 12 hours prior to your test. (ex: coffee, chocolate, sodas, tea). Do bring a list of your current medications with you. You may take your medications as normal. Do wear  comfortable clothes (no dresses or overalls) and walking shoes, tennis shoes preferred (No heels or open toe shoes are allowed). Do NOT wear cologne, perfume, aftershave, or lotions (deodorant is allowed). If these instructions are not followed, your test will have to be rescheduled.   Please report to [address], [address] for your test.  If you have questions or concerns about your appointment, you can call the Nuclear Lab at [phone].   If you cannot keep your appointment, please provide 24 hours notification to the Nuclear Lab, to avoid a possible $50 charge to your account.

## 2023-05-13 NOTE — Progress Notes (Signed)
 Cardiology Office Note:  .   Date:  05/13/2023  ID:  Becky [name], DOB 05/02/1950, MRN [id number] PCP:  Jackelyn Poling, DO  Former Cardiology Providers: Dr. Elberta Fortis. [facility] Providers Cardiologist:  Tessa Lerner, DO, [facility] (established care 05/13/23) Electrophysiologist:  Will Jorja Loa, MD  Electrophysiologist:  Will Jorja Loa, MD  Click to update primary MD,subspecialty MD or APP then REFRESH:1}    Chief Complaint  Patient presents with   Follow-up    Establish care. Dizziness, shortness of breath    History of Present Illness: .   Becky [name] is a 73 y.o. Caucasian  female whose past medical history and cardiovascular risk factors includes: Hypertension, hyperlipidemia, CKD, stroke, TIA, hypothyroidism, history of prolonged QT.  Patient was last seen in the office about 2 years ago in 2022 by EP and now was referred to general cardiology to establish care given her dizziness.  Shortness of breath: More pronounced after her COVID-19 infection in the past. Ongoing for the last 3 years. Can occur at rest and also with exertion. No associated chest pain. Denies orthopnea, PND, lower extremity swelling. No history of DVT or PE. No prolonged periods of immobilization or surgery in the recent past  Dizziness: Ongoing for the last 1 year. Occurs daily. Brought on by changing positions quickly, turning her head side-to-side. No near-syncope or syncopal events.   She takes both of her blood pressure medications which include amlodipine and hydrochlorothiazide in the morning Denies loss of blood in the recent past or history of anemia No focal neurological deficits.  Review of Systems: .   Review of Systems  Cardiovascular:  Positive for dyspnea on exertion. Negative for chest pain, claudication, irregular heartbeat, leg swelling, near-syncope, orthopnea, palpitations, paroxysmal nocturnal dyspnea and syncope.  Respiratory:  Positive for  shortness of breath.   Hematologic/Lymphatic: Negative for bleeding problem.    Studies Reviewed:   EKG: EKG Interpretation Date/Time:  Wednesday May 13 2023 14:23:54 EDT Ventricular Rate:  59 PR Interval:  130 QRS Duration:  84 QT Interval:  412 QTC Calculation: 407 R Axis:   69  Text Interpretation: Sinus bradycardia T wave abnormality, consider lateral ischemia When compared with ECG of 01-Feb-2023 02:36, QT has shortened T wave abnormality more prominent compared to prior tracing. Confirmed by Tessa Lerner [phone]) on 05/13/2023 2:46:10 PM  Echocardiogram: 08/18/2021 1. Left ventricular ejection fraction, by estimation, is 65 to 70%. The  left ventricle has normal function. The left ventricle has no regional  wall motion abnormalities. There is mild concentric left ventricular  hypertrophy. Left ventricular diastolic  parameters are consistent with Grade I diastolic dysfunction (impaired  relaxation). Elevated left ventricular end-diastolic pressure.   2. Right ventricular systolic function is normal. The right ventricular  size is normal. There is normal pulmonary artery systolic pressure.   3. The mitral valve is normal in structure. Trivial mitral valve  regurgitation. No evidence of mitral stenosis.   4. The aortic valve is normal in structure. Aortic valve regurgitation is  not visualized. No aortic stenosis is present.   5. The inferior vena cava is normal in size with greater than 50%  respiratory variability, suggesting right atrial pressure of 3 mmHg.   RADIOLOGY: NA  Risk Assessment/Calculations:   NA   Labs:       Latest Ref Rng & Units 02/04/2023    7:05 AM 02/03/2023    1:56 PM 02/02/2023   10:19 AM  CBC  WBC 4.0 - 10.5 K/uL  9.3  11.9  16.2   Hemoglobin 12.0 - 15.0 g/dL 16.1  09.6  04.5   Hematocrit 36.0 - 46.0 % 34.9  36.7  37.2   Platelets 150 - 400 K/uL 315  345  343        Latest Ref Rng & Units 02/04/2023    7:05 AM 02/03/2023    6:05 AM  02/02/2023    3:30 AM  BMP  Glucose 70 - 99 mg/dL 409  87  811   BUN 8 - 23 mg/dL 21  24  23    Creatinine 0.44 - 1.00 mg/dL 9.14  7.82  9.56   Sodium 135 - 145 mmol/L 137  137  138   Potassium 3.5 - 5.1 mmol/L 3.5  3.7  3.7   Chloride 98 - 111 mmol/L 100  101  103   CO2 22 - 32 mmol/L 27  24  26    Calcium 8.9 - 10.3 mg/dL 9.6  9.0  9.1       Latest Ref Rng & Units 02/04/2023    7:05 AM 02/03/2023   11:47 AM 02/03/2023    6:05 AM  CMP  Glucose 70 - 99 mg/dL 213   87   BUN 8 - 23 mg/dL 21   24   Creatinine 0.86 - 1.00 mg/dL 5.78   4.69   Sodium 629 - 145 mmol/L 137   137   Potassium 3.5 - 5.1 mmol/L 3.5   3.7   Chloride 98 - 111 mmol/L 100   101   CO2 22 - 32 mmol/L 27   24   Calcium 8.9 - 10.3 mg/dL 9.6   9.0   Total Protein 6.5 - 8.1 g/dL 6.3  7.4    Total Bilirubin <1.2 mg/dL 0.7  0.6    Alkaline Phos 38 - 126 U/L 61  75    AST 15 - 41 U/L 20  24    ALT 0 - 44 U/L 23  25     No components found for: "NTPROBNP" No results for input(s): "PROBNP" in the last 8760 hours. Recent Labs    02/01/23 0810  TSH 1.052    External Labs: Collected: 04/28/2023 [facility] database. Total cholesterol 187, HDL 66, LDL 101, triglycerides 111.  Collected: March 25, 2023. Hemoglobin 12.9 g/dL. Creatinine 1.04.  Physical Exam:    Today's Vitals   05/13/23 1408  BP: 133/77  Pulse: (!) 59  SpO2: 95%  Height: 5' (1.524 m)   Body mass index is 39.47 kg/m. Wt Readings from Last 3 Encounters:  02/19/23 202 lb 1.6 oz (91.7 kg)  02/04/23 201 lb 11.5 oz (91.5 kg)  01/21/23 205 lb (93 kg)   Orthostatic vital signs: Supine 124/78 pulse was 53 Sitting 102/69 pulse was 61 Standing 104/68 pulse was 59 3-minute standing 123/88 pulse of 61  Physical Exam  Constitutional: No distress. She appears chronically ill.  hemodynamically stable  Neck: No JVD present.  Cardiovascular: Normal rate, regular rhythm, S1 normal and S2 normal. Exam reveals no gallop, no S3 and no S4.  No murmur  heard. Pulmonary/Chest: Effort normal and breath sounds normal. No stridor. She has no wheezes. She has no rales.  Musculoskeletal:        General: No edema.     Cervical back: Neck supple.  Neurological:  Alert and oriented x 4, strength is intact equally in upper and lower extremities, cranial nerves II to XII are also grossly intact.  Skin: Skin is warm.  Impression & Recommendation(s):  Impression:   ICD-10-CM   1. Shortness of breath  R06.02 ECHOCARDIOGRAM COMPLETE    MYOCARDIAL PERFUSION IMAGING    Basic metabolic panel    Hemoglobin and hematocrit, blood    Pro b natriuretic peptide (BNP)    D-Dimer, Quantitative    D-Dimer, Quantitative    Pro b natriuretic peptide (BNP)    Hemoglobin and hematocrit, blood    Basic metabolic panel    2. Dizziness  R42     3. Orthostatic dizziness  R42     4. Essential hypertension, benign  I10 EKG 12-Lead    5. Cerebrovascular accident (CVA), unspecified mechanism (HCC)  I63.9     6. TIA (transient ischemic attack)  G45.9     7. Pure hypercholesterolemia  E78.00     8. Long QT interval  R94.31        Recommendation(s):  Shortness of breath Chronic but progressive Euvolemic on physical examination. No prior history of DVT/PE/heart failure Echo will be ordered to evaluate for structural heart disease and left ventricular systolic function. Given her dyspnea which is progressive, EKG also notes T wave inversions in the lateral leads concerning for possible ischemia, no recent ischemic workup.  Recommend Lexiscan stress test to evaluate for reversible ischemia given her symptoms. Will check NT proBNP, D-dimer for completeness  Dizziness Multifactorial-orthostatic vital signs are positive.   Recommend that she follows up with PCP for evaluation for benign positional vertigo and also neurology to rule out peripheral/central etiologies Orthostatic vital signs are positive. Recommend taking hydrochlorothiazide 25 mg p.o. every  morning. Hold amlodipine for now. I have asked her to discuss with PCP with regards to physical therapy-to help fall prevention  Orthostatic dizziness Orthostatic vital signs positive. Changes as mentioned above  Essential hypertension, benign Medication changes noted above. Reemphasized importance of low-salt diet. Since recommending her to hold amlodipine for now recommended checking her blood pressures at home.  If her systolic blood pressures are consistently greater than 140 mmHg she is advised to restart amlodipine at 5 mg p.o. every pm.  If she is able to tolerate the lower dose without any dizziness no other changes would be warranted.  Otherwise, we will need to discontinue amlodipine and avoid vasodilators.  Cerebrovascular accident (CVA), unspecified mechanism (HCC) TIA (transient ischemic attack) Given her history and ongoing dizziness may benefit from neurology consultation as discussed above  Pure hypercholesterolemia Most recent labs from February 2025 via [facility] database reports LDL at 101 mg/dL. Patient states that PCP has recently transitioned her from pravastatin 40 mg p.o. daily to 80 mg p.o. daily. Recommend following up with PCP for recheck of her lipids. Cardiology following peripherally, managed by primary care provider.  Long QT interval Has been evaluated by EP in the past. EKG today notes shorter QT interval compared to prior tracings. Recommend avoiding medications that may prolong her QT interval.  Patient is advised to speak to her providers regarding this matter prior to starting new medications. Currently on nadolol 20 mg p.o. daily   Orders Placed:  Orders Placed This Encounter  Procedures   Basic metabolic panel    Standing Status:   Future    Number of Occurrences:   1    Expected Date:   05/13/2023    Expiration Date:   05/12/2024   Hemoglobin and hematocrit, blood    Standing Status:   Future    Number of Occurrences:   1    Expected Date:  05/13/2023    Expiration Date:   05/12/2024   Pro b natriuretic peptide (BNP)    Standing Status:   Future    Number of Occurrences:   1    Expected Date:   05/13/2023    Expiration Date:   05/12/2024   D-Dimer, Quantitative    Standing Status:   Future    Number of Occurrences:   1    Expected Date:   05/13/2023    Expiration Date:   05/12/2024   MYOCARDIAL PERFUSION IMAGING    Standing Status:   Future    Expiration Date:   05/12/2024    Where should this be performed?:   [facility]    Type of stress:   Lexiscan   EKG 12-Lead   ECHOCARDIOGRAM COMPLETE    Standing Status:   Future    Expected Date:   05/20/2023    Expiration Date:   05/12/2024    Where should this test be performed:   [facility])    Does the patient weigh less than or greater than 250 lbs?:   Patient weighs less than 250 lbs    Perflutren DEFINITY (image enhancing agent) should be administered unless hypersensitivity or allergy exist:   Administer Perflutren    Reason for exam-Echo:   Other-Full Diagnosis List    Full ICD-10/Reason for Exam:   Shortness of breath [786.05.ICD-9-CM]    Final Medication List:   No orders of the defined types were placed in this encounter.   Medications Discontinued During This Encounter  Medication Reason   Dexmethylphenidate HCl 40 MG CP24    ARIPiprazole (ABILIFY) 5 MG tablet    albuterol (PROVENTIL) (2.5 MG/3ML) 0.083% nebulizer solution    fluticasone-salmeterol (ADVAIR DISKUS) 500-50 MCG/ACT AEPB    albuterol (VENTOLIN HFA) 108 (90 Base) MCG/ACT inhaler      Current Outpatient Medications:    ALPRAZolam (XANAX) 0.25 MG tablet, Take 0.25 mg by mouth 3 (three) times daily as needed for anxiety or sleep., Disp: , Rfl: 1   aspirin EC 325 MG tablet, Take 650 mg by mouth at bedtime., Disp: , Rfl:    desvenlafaxine (PRISTIQ) 100 MG 24 hr tablet, Take 100 mg by mouth daily., Disp: , Rfl:    hydrochlorothiazide (HYDRODIURIL) 25 MG  tablet, Take 25 mg by mouth every morning., Disp: , Rfl:    levothyroxine (SYNTHROID) 50 MCG tablet, Take 50 mcg by mouth daily before breakfast., Disp: , Rfl:    nadolol (CORGARD) 20 MG tablet, Take 1 tablet (20 mg total) by mouth daily. Patient needs appt for # 90 supply or future refills. Please call office to schedule appt., Disp: 30 tablet, Rfl: 0   NONFORMULARY OR COMPOUNDED ITEM, Place 1-4 drops into the right eye See admin instructions. Prednisolone 1% / Moxifloxacin 0.5% / Bromfenac 0.075% Opth Soln.  Instill 1 drop into the right eye four times daily for 4 days, then 1 drop twice a day for 2 days, then 1 drop once a day until finished., Disp: , Rfl:    OVER THE COUNTER MEDICATION, Take 2 tablets by mouth daily as needed. Equate indigestion relief., Disp: , Rfl:    pramipexole (MIRAPEX) 0.25 MG tablet, Take 0.5 mg by mouth at bedtime., Disp: , Rfl: 11   pravastatin (PRAVACHOL) 80 MG tablet, Take 80 mg by mouth daily., Disp: , Rfl:    amLODipine (NORVASC) 10 MG tablet, Take 1 tablet (10 mg total) by mouth daily.,  Disp: 30 tablet, Rfl: 2  Consent:   Informed Consent   Shared Decision Making/Informed Consent The risks [chest pain, shortness of breath, cardiac arrhythmias, dizziness, blood pressure fluctuations, myocardial infarction, stroke/transient ischemic attack, nausea, vomiting, allergic reaction, radiation exposure, metallic taste sensation and life-threatening complications (estimated to be 1 in 10,000)], benefits (risk stratification, diagnosing coronary artery disease, treatment guidance) and alternatives of a nuclear stress test were discussed in detail with Ms. Leonhart and she agrees to proceed.     Disposition:   6 months sooner if needed.  Her questions and concerns were addressed to her satisfaction. She voices understanding of the recommendations provided during this encounter.    Signed, Tessa Lerner, DO, [facility]  [facility]  [address] [place],  [place] [address] 05/13/2023 7:24 PM

## 2023-05-14 ENCOUNTER — Ambulatory Visit (HOSPITAL_BASED_OUTPATIENT_CLINIC_OR_DEPARTMENT_OTHER): Payer: No Typology Code available for payment source | Admitting: Physical Therapy

## 2023-05-14 ENCOUNTER — Encounter (HOSPITAL_BASED_OUTPATIENT_CLINIC_OR_DEPARTMENT_OTHER): Payer: Self-pay | Admitting: Physical Therapy

## 2023-05-14 DIAGNOSIS — M5459 Other low back pain: Secondary | ICD-10-CM

## 2023-05-14 DIAGNOSIS — R2681 Unsteadiness on feet: Secondary | ICD-10-CM

## 2023-05-14 DIAGNOSIS — R2689 Other abnormalities of gait and mobility: Secondary | ICD-10-CM

## 2023-05-14 DIAGNOSIS — M6281 Muscle weakness (generalized): Secondary | ICD-10-CM

## 2023-05-14 LAB — BASIC METABOLIC PANEL
BUN/Creatinine Ratio: 20 (ref 12–28)
BUN: 22 mg/dL (ref 8–27)
CO2: 27 mmol/L (ref 20–29)
Calcium: 9.4 mg/dL (ref 8.7–10.3)
Chloride: 100 mmol/L (ref 96–106)
Creatinine, Ser: 1.11 mg/dL — ABNORMAL HIGH (ref 0.57–1.00)
Glucose: 88 mg/dL (ref 70–99)
Potassium: 4 mmol/L (ref 3.5–5.2)
Sodium: 139 mmol/L (ref 134–144)
eGFR: 53 mL/min/{1.73_m2} — ABNORMAL LOW (ref >59)

## 2023-05-14 LAB — HEMOGLOBIN AND HEMATOCRIT, BLOOD
Hematocrit: 35.7 % (ref 34.0–46.6)
Hemoglobin: 12 g/dL (ref 11.1–15.9)

## 2023-05-14 LAB — PRO B NATRIURETIC PEPTIDE: NT-Pro BNP: 545 pg/mL — ABNORMAL HIGH (ref 0–301)

## 2023-05-14 LAB — D-DIMER, QUANTITATIVE: D-DIMER: 0.25 mg{FEU}/L (ref 0.00–0.49)

## 2023-05-14 NOTE — Therapy (Signed)
 OUTPATIENT PHYSICAL THERAPY THORACOLUMBAR TREATMENT   Patient Name: Becky [name] MRN: [id number] DOB:Feb 09, 1951, 73 y.o., female Today's Date: 05/14/2023  END OF SESSION:  PT End of Session - 05/14/23 1437     Visit Number 4    Number of Visits 12    Date for PT Re-Evaluation 05/23/23    Authorization Type [employer] [place]    PT Start Time 1437    PT Stop Time 1515    PT Time Calculation (min) 38 min    Activity Tolerance Patient tolerated treatment well    Behavior During Therapy [facility] for tasks assessed/performed             Past Medical History:  Diagnosis Date   Chronic kidney disease    CVA (cerebral vascular accident) (HCC) 11/14/2013   Depression    followed by Dr Arletha Pili frequently 10/01/2015   Genital herpes    History of melanoma    followed by derm Dr Emily Filbert   Hyperlipidemia    Hypertension    MCI (mild cognitive impairment) 10/01/2015   Pure hypercholesterolemia 11/14/2013   Stroke (HCC)    Old stroke seen on MRI 12/2009, carotid doppler w minimal plaque, MRI off the head otherwise neg for acute disease.   Thyroid disease    hypothyroid. Followed by Dr Evelene Croon   TIA (transient ischemic attack) 8/08   followed by neurologist Dr Pearlean Brownie   Past Surgical History:  Procedure Laterality Date   ABDOMINAL HYSTERECTOMY     APPENDECTOMY     CHOLECYSTECTOMY     MELANOMA EXCISION     x2   Patient Active Problem List   Diagnosis Date Noted   Angioedema 02/01/2023   Chronic fatigue 09/11/2021   Shortness of breath at rest 08/17/2021   Moderate persistent asthma 05/06/2021   History of COVID-19 03/19/2021   COVID-19 long hauler manifesting chronic dyspnea 03/19/2021   Thyroid disease    Stroke [facility])    Hypertension    Hyperlipidemia    History of melanoma    Genital herpes    Depression    Chronic kidney disease    MCI (mild cognitive impairment) 10/01/2015   Falls frequently 10/01/2015   Essential hypertension, benign 11/14/2013   CVA  (cerebral vascular accident) (HCC) 11/14/2013   Pure hypercholesterolemia 11/14/2013   TIA (transient ischemic attack) 10/02/2006    PCP: Jackelyn Poling MD  REFERRING PROVIDER: Luciano Cutter, MD   REFERRING DIAG: M54.50 (ICD-10-CM) - Acute bilateral low back pain without sciatica   Rationale for Evaluation and Treatment: Rehabilitation  THERAPY DIAG:  Muscle weakness (generalized)  Unsteadiness on feet  Other low back pain  Other abnormalities of gait and mobility  ONSET DATE: 3 months  SUBJECTIVE:  SUBJECTIVE STATEMENT: Back isnt bothering her irhgt now but was when she got up. Some  PERTINENT HISTORY:  Recert hospitalization due to allergic reaction to med.  Strengthening/deconditioning post hospital. Interested in pool therapy  CVA 2011  PAIN:  Are you having pain? No  PRECAUTIONS: None  RED FLAGS: None   WEIGHT BEARING RESTRICTIONS: No  FALLS:  Has patient fallen in last 6 months? No  LIVING ENVIRONMENT: Lives with: lives alone Lives in: House/apartment Stairs: No Has following equipment at home: Single point cane  OCCUPATION: retired  PLOF: Independent  PATIENT GOALS: improve strength, improve endurance and breathing  NEXT MD VISIT: tomorrow  OBJECTIVE:  Note: Objective measures were completed at Evaluation unless otherwise noted.  DIAGNOSTIC FINDINGS:  none  PATIENT SURVEYS:  FOTO Primary measure 94% with goal of 81%  COGNITION: Overall cognitive status: Within functional limits for tasks assessed     SENSATION: WFL  MUSCLE LENGTH: Hamstrings: wfl tightness L>R   POSTURE: slight rounded shoulders, slight forward head, and increased thoracic kyphosis  PALPATION: No TTP  LUMBAR ROM:   WFL  LOWER EXTREMITY ROM:     WFL  LOWER EXTREMITY  MMT:    MMT Right eval Left eval  Hip flexion 39.0 35.8  Hip extension    Hip abduction 23.6 26.7  Hip adduction    Hip internal rotation    Hip external rotation    Knee flexion    Knee extension 28.3 31.3  Ankle dorsiflexion    Ankle plantarflexion    Ankle inversion    Ankle eversion     (Blank rows = not tested)  LUMBAR SPECIAL TESTS:  Slump test: Negative  FUNCTIONAL TESTS:  5 times sit to stand: 21.45 from pool bench Timed up and go (TUG): 28.03 Berg Balance Scale: 30/56  GAIT: Distance walked: 400 ft Assistive device utilized: Single point cane Level of assistance: Modified independence Comments: Slowed cadence use of cane  TREATMENT  05/14/23 117/95 mmHg HR 79 BPM LAQ 2# 1 x 10 with 5 second holds Seated march 2# 2 x 10 Standing hip abduction  2# 2 x 10 Step up 4 inch 2 x 10 Seated row GTB 2 x 10  3/11 **2 pillows under head with supine** LTR x10ea 3" hold Hooklying PPT 3" x10 Hooklying marches x10ea with TrA Hooklying clams GTB 2x10 Nu-step L 3 x4min RPE level 5  End of session: 64bpm 95-96%O2 121/69mmHg     Previous: Self care: pt instruction and edu on increasing walking activities at home; discussed benefits of pool therapy;completing land based intervention as tolerated ; encouraging comfort level for progressing towards goals.  Pt schedule changed to reflect just land based intervention  Nu-step 2 minute intervals with 1-2 minute rest period between x 20 minutes.  Pt monitored throughout to ensure approp exertion level not exceeded  PATIENT EDUCATION:  Education details: Discussed eval findings, rehab rationale, aquatic program progression/POC and pools in area. Patient is in agreement  Person educated: Patient Education method: Explanation Education comprehension: verbalized understanding  HOME EXERCISE  PROGRAM: Access Code: [email or web address] URL: [email or web address]   ASSESSMENT:  CLINICAL IMPRESSION: Continued with LE  and postural strengthening and patient tolerates well with short rest breaks. Initiated HEP and educated on progression PRN. Patient will continue to benefit from physical therapy in order to improve function and reduce impairment.    Initial impression Patient is a 73 y.o. f who was seen today for physical therapy evaluation and treatment for LBP which has since resolved from date of referral.  She reports main concern is residual general weakness from bout of LBP that lasted for a few weeks and long covid (2022).  She presents today with strength and balance deficits as indicated by objective and functional testing.  She report some slight SOB with distance walked from parkinglot and general fatigue.  She is a good candidate for skilled PT to improve all deficits which are decreasing her functional mobility and safety as well as QOL.  Will begin in aquatics then add land based for increased loading once initial strengthening has begun.  OBJECTIVE IMPAIRMENTS: Abnormal gait, decreased activity tolerance, decreased balance, decreased endurance, decreased mobility, decreased strength, obesity, and pain.   ACTIVITY LIMITATIONS: carrying, lifting, stairs, transfers, and locomotion level  PARTICIPATION LIMITATIONS: shopping, community activity, and yard work  PERSONAL FACTORS: Age, Fitness, Past/current experiences, Time since onset of injury/illness/exacerbation, and 3+ comorbidities: see PmHx   are also affecting patient's functional outcome.   REHAB POTENTIAL: Good  CLINICAL DECISION MAKING: Evolving/moderate complexity  EVALUATION COMPLEXITY: Moderate   GOALS: Goals reviewed with patient? No  SHORT TERM GOALS: Target date: 05/23/23  Pt will tolerate full aquatic sessions consistently without increase in pain and with improving function to demonstrate good  toleration and effectiveness of intervention.  Baseline: Goal status: INITIAL  2.  Pt will tolerate walking to and from setting along with completion of aquatic session without excessive fatigue or addition of pain Baseline:  Goal status: INITIAL  3.  Pt will improve on 5 X STS test to <or=17s ((MDC)  to demonstrate improving functional lower extremity strength, transitional movements, and balance Baseline: 21.45 Goal status: INITIAL  4.  Pt will improve on Berg balance test to >/= 45/56 to demonstrate a decrease in fall risk. Baseline: 30/56 Goal status: INITIAL  5.  Pt will improve on Tug test to <or=  20s to demonstrate improvement in lower extremity function, mobility and decreased fall risk. Baseline: 28.03 Goal status: INITIAL  6.  Pt will be indep with final HEP's (land and aquatic as appropriate) for continued management of condition Baseline: TBA Goal status: INITIAL  LONG TERM GOALS: will se at re-cert as approp  PLAN:  PT FREQUENCY: 1-2x/week  PT DURATION: 8 weeks 12 visits  PLANNED INTERVENTIONS: 97164- PT Re-evaluation, 97110-Therapeutic exercises, 97530- Therapeutic activity, 97112- Neuromuscular re-education, 97535- Self Care, 24401- Manual therapy, [id number]- Gait training, [id number]- Orthotic Fit/training, [id number]- Aquatic Therapy, 97014- Electrical stimulation (unattended), [phone]- Ionotophoresis 4mg /ml Dexamethasone, Patient/Family education, Balance training, Stair training, Taping, Dry Needling, Joint mobilization, DME instructions, Cryotherapy, and Moist heat.  PLAN FOR NEXT SESSION: aquatic and land: general strengthening; balance and gait retraining; core stabilization; aerobic capacity training    Wyman Songster, PT 05/14/2023, 2:40 PM  [facility] [address] [place], [place], [address] Phone: [phone]  Fax:  [phone]

## 2023-05-18 ENCOUNTER — Telehealth: Payer: Self-pay | Admitting: Cardiology

## 2023-05-18 NOTE — Progress Notes (Unsigned)
 .   Cardiology Office Note Date:  05/18/2023  Patient ID:  Demoni, Parmar [birth year], MRN [id number] PCP:  Jackelyn Poling, DO  Electrophysiologist:  Dr. Elberta Fortis   Chief Complaint:  *** overdue annual visit  History of Present Illness: Krina Mraz is a 73 y.o. female with history of HTN, HLD, CKD, stroke, TIA, hypothyroidism, prolonged QT.   She comes in today to be seen for Dr. Elberta Fortis.  Last seen by him 09/27/2018.  She was doing well, discussed some degree of SOB at rest and exertion.  She was taking nadolol, though currently had run out. Her medicine refilled, instructed to avoid QT prolonging medicines. Planned for stress test to evaluate her SOB, though low suspicion for anginal equivalent.  Stress was low risk.   I saw her Aug 2021 She is doing well She has a small dog that she walks 5x per day, takes about 10-25minutes stopping for the dog, she is unsure the length of the walk, but denies any exertional intolerances with her walks or her ADLs Denies any CP, palpitations or cardiac awareness NO SOB/DOE No dizzy spells, near syncope or syncope She had Trazodone that she used infrequently, urged not to with some Qt properties and see her PMD for an alternative  COVID in July  I saw her 01/29/21  She reports that her COVID infection was terrible and she felt awful for a long time, remains SOB with minimal exertion. She says she felt GREAT prior to getting sick and has never felt well again She denies CP, palpitations No near syncope or syncope. She is frustrated over the persistent SOB since COVID Discussed her Trazodone, rare use > advised she d/w her PMD an alternative without QT prolonging properties  Admitted June 2023 SOB > asthma exacerbation/long-haul COVID  Anxiety/ depression, home meds continued > OP psych follow up  Admitted 02/01/23 Tongue swelling/angioedema treated with EpiPen, Solu-Medrol, Pepcid and Benadryl and admitted to ICU  Felt to be  the cause and stopped  Nocturnal bradycardia mentioned, maintained on her nadolol  *** med list, QT   Past Medical History:  Diagnosis Date   Chronic kidney disease    CVA (cerebral vascular accident) (HCC) 11/14/2013   Depression    followed by Dr Arletha Pili frequently 10/01/2015   Genital herpes    History of melanoma    followed by derm Dr Emily Filbert   Hyperlipidemia    Hypertension    MCI (mild cognitive impairment) 10/01/2015   Pure hypercholesterolemia 11/14/2013   Stroke [facility])    Old stroke seen on MRI 12/2009, carotid doppler w minimal plaque, MRI off the head otherwise neg for acute disease.   Thyroid disease    hypothyroid. Followed by Dr Evelene Croon   TIA (transient ischemic attack) 8/08   followed by neurologist Dr Pearlean Brownie    Past Surgical History:  Procedure Laterality Date   ABDOMINAL HYSTERECTOMY     APPENDECTOMY     CHOLECYSTECTOMY     MELANOMA EXCISION     x2    Current Outpatient Medications  Medication Sig Dispense Refill   ALPRAZolam (XANAX) 0.25 MG tablet Take 0.25 mg by mouth 3 (three) times daily as needed for anxiety or sleep.  1   amLODipine (NORVASC) 10 MG tablet Take 1 tablet (10 mg total) by mouth daily. 30 tablet 2   aspirin EC 325 MG tablet Take 650 mg by mouth at bedtime.     desvenlafaxine (PRISTIQ) 100 MG 24 hr tablet Take  100 mg by mouth daily.     hydrochlorothiazide (HYDRODIURIL) 25 MG tablet Take 25 mg by mouth every morning.     levothyroxine (SYNTHROID) 50 MCG tablet Take 50 mcg by mouth daily before breakfast.     nadolol (CORGARD) 20 MG tablet Take 1 tablet (20 mg total) by mouth daily. Patient needs appt for # 90 supply or future refills. Please call office to schedule appt. 30 tablet 0   NONFORMULARY OR COMPOUNDED ITEM Place 1-4 drops into the right eye See admin instructions. Prednisolone 1% / Moxifloxacin 0.5% / Bromfenac 0.075% Opth Soln.  Instill 1 drop into the right eye four times daily for 4 days, then 1 drop twice a day for 2 days,  then 1 drop once a day until finished.     OVER THE COUNTER MEDICATION Take 2 tablets by mouth daily as needed. Equate indigestion relief.     pramipexole (MIRAPEX) 0.25 MG tablet Take 0.5 mg by mouth at bedtime.  11   pravastatin (PRAVACHOL) 80 MG tablet Take 80 mg by mouth daily.     No current facility-administered medications for this visit.    Allergies:   Penicillins, Telmisartan, Iodinated contrast media, Iohexol, Latex, and Nsaids   Social History:  The patient  reports that she has never smoked. She has never used smokeless tobacco. She reports that she does not drink alcohol and does not use drugs.   Family History:  The patient's family history includes Breast cancer in her mother; Dementia in her maternal aunt; Heart attack in her father; Hyperlipidemia in her father and sister; Hypertension in her father and sister.  ROS:  Please see the history of present illness. All other systems are reviewed and otherwise negative.   PHYSICAL EXAM:  VS:  There were no vitals taken for this visit. BMI: There is no height or weight on file to calculate BMI. Well nourished, well developed, in no acute distress  HEENT: normocephalic, atraumatic  Neck: no JVD, carotid bruits or masses Cardiac: *** RRR; no significant murmurs, no rubs, or gallops Lungs: *** CTA b/l, no wheezing, rhonchi or rales  Abd: soft, nontender MS: no deformity or atrophy Ext: *** no edema  Skin: warm and dry, no rash Neuro:  No gross deficits appreciated Psych: euthymic mood, full affect   EKG:  Done today and reviewed by myself shows  ***    08/18/2021: TTE  1. Left ventricular ejection fraction, by estimation, is 65 to 70%. The  left ventricle has normal function. The left ventricle has no regional  wall motion abnormalities. There is mild concentric left ventricular  hypertrophy. Left ventricular diastolic  parameters are consistent with Grade I diastolic dysfunction (impaired  relaxation). Elevated left  ventricular end-diastolic pressure.   2. Right ventricular systolic function is normal. The right ventricular  size is normal. There is normal pulmonary artery systolic pressure.   3. The mitral valve is normal in structure. Trivial mitral valve  regurgitation. No evidence of mitral stenosis.   4. The aortic valve is normal in structure. Aortic valve regurgitation is  not visualized. No aortic stenosis is present.   5. The inferior vena cava is normal in size with greater than 50%  respiratory variability, suggesting right atrial pressure of 3 mmHg.   10/06/2018: stress myoview The left ventricular ejection fraction is hyperdynamic (>65%). Nuclear stress EF: 67%. There was no ST segment deviation noted during stress. The study is normal. This is a low risk study. No prior study for  comparison.   12/18/2009: TTE Study Conclusions   - Left ventricle: The cavity size was normal. Systolic function was     normal. The estimated ejection fraction was in the range of 60% to     65%. Although no diagnostic regional wall motion abnormality was     identified, this possibility cannot be completely excluded on the     basis of this study. Doppler parameters are consistent with     abnormal left ventricular relaxation (grade 1 diastolic     dysfunction).   - Right ventricle: The cavity size was normal. Wall thickness was at     the upper limits of normal.   - Pericardium, extracardiac: A trivial pericardial effusion was     identified.   Impressions:   No cardiac source of emboli was indentified.   Recommendations: Consider transesophageal echocardiography if   clinically indicated   Recent Labs: 02/01/2023: TSH 1.052 02/04/2023: ALT 23; Magnesium 1.8; Platelets 315 05/13/2023: BUN 22; Creatinine, Ser 1.11; Hemoglobin 12.0; NT-Pro BNP 545; Potassium 4.0; Sodium 139  No results found for requested labs within last 365 days.   CrCl cannot be calculated (Unknown ideal weight.).   Wt  Readings from Last 3 Encounters:  02/19/23 202 lb 1.6 oz (91.7 kg)  02/04/23 201 lb 11.5 oz (91.5 kg)  01/21/23 205 lb (93 kg)     Other studies reviewed: Additional studies/records reviewed today include: summarized above  ASSESSMENT AND PLAN:  1. Prolonged QT     *** Reports compliance with her nadolol     *** QT looks OK  *** Reviewed her med list, re-discussed her trazodone, she again reports uses infrequently for sleep, rarely really, leaves her feeling a bit sedated I again discussed recommendation that she not use this and ask her PMD for an alternative without QT prolongation proprties   2. HTN     ***  3. HLD     PMD monitors and manages       Disposition: ***  Current medicines are reviewed at length with the patient today.  The patient did not have any concerns regarding medicines.  Norma Fredrickson, PA-C 05/18/2023 12:51 PM     [facility] [address] [place] [address] [phone] (office)  [phone] (fax)

## 2023-05-18 NOTE — Telephone Encounter (Signed)
 Follow Up:        Patient is retuning call from this morning, concerning her lab results.

## 2023-05-18 NOTE — Telephone Encounter (Signed)
 Sunit Odum, DO 05/14/2023 11:40 PM EDT     Renal function is relatively at baseline. Clotting factor/indicator is within normal limits. Fluid markers are slightly elevated but below age corrected thresholds. Given orthostatic vital signs being positive hold off on additional diuretics. Continue with remainder of the testing.   Sunit Tolia, DO, [facility]    The patient has been notified of the result and verbalized understanding.  All questions (if any) were answered.  Will forward a copy of her result to her PCP on file, at pts request.

## 2023-05-19 ENCOUNTER — Encounter (HOSPITAL_BASED_OUTPATIENT_CLINIC_OR_DEPARTMENT_OTHER): Payer: Self-pay | Admitting: Pulmonary Disease

## 2023-05-19 ENCOUNTER — Ambulatory Visit: Payer: Medicare Other | Admitting: Adult Health

## 2023-05-19 ENCOUNTER — Encounter: Payer: Self-pay | Admitting: Adult Health

## 2023-05-19 ENCOUNTER — Ambulatory Visit (HOSPITAL_BASED_OUTPATIENT_CLINIC_OR_DEPARTMENT_OTHER): Admitting: Pulmonary Disease

## 2023-05-19 VITALS — BP 152/89 | HR 66 | Ht 60.0 in | Wt 208.0 lb

## 2023-05-19 VITALS — BP 122/76 | HR 74 | Ht 60.0 in | Wt 207.7 lb

## 2023-05-19 DIAGNOSIS — R0600 Dyspnea, unspecified: Secondary | ICD-10-CM

## 2023-05-19 DIAGNOSIS — R400 Somnolence: Secondary | ICD-10-CM

## 2023-05-19 DIAGNOSIS — R220 Localized swelling, mass and lump, head: Secondary | ICD-10-CM

## 2023-05-19 DIAGNOSIS — U099 Post covid-19 condition, unspecified: Secondary | ICD-10-CM | POA: Diagnosis not present

## 2023-05-19 DIAGNOSIS — R519 Headache, unspecified: Secondary | ICD-10-CM | POA: Diagnosis not present

## 2023-05-19 DIAGNOSIS — R29898 Other symptoms and signs involving the musculoskeletal system: Secondary | ICD-10-CM

## 2023-05-19 DIAGNOSIS — H8112 Benign paroxysmal vertigo, left ear: Secondary | ICD-10-CM

## 2023-05-19 DIAGNOSIS — G4733 Obstructive sleep apnea (adult) (pediatric): Secondary | ICD-10-CM

## 2023-05-19 DIAGNOSIS — J4551 Severe persistent asthma with (acute) exacerbation: Secondary | ICD-10-CM

## 2023-05-19 DIAGNOSIS — R42 Dizziness and giddiness: Secondary | ICD-10-CM

## 2023-05-19 MED ORDER — PREDNISONE 10 MG PO TABS: ORAL_TABLET | ORAL | 0 refills | Status: AC

## 2023-05-19 MED ORDER — SPIRIVA RESPIMAT 1.25 MCG/ACT IN AERS: 1.0000 | INHALATION_SPRAY | Freq: Every day | RESPIRATORY_TRACT | 11 refills | Status: DC

## 2023-05-19 NOTE — Patient Instructions (Addendum)
 Moderate persistent asthma - mild exacerbation Prednisone taper CONTINUE Advair 500-50 ONE puff TWICE a day.  START Spiriva 1.25 mcg TWO puffs in the morning  >Will provide samples if available CONTINUE Albuterol or nebulizer AS NEEDED.   Deconditioning/Back pain --CONTINUE outpatient therapy at [facility]

## 2023-05-19 NOTE — Patient Instructions (Addendum)
 Your Plan:  Will follow up with Dr. Pearlean Brownie regarding tongue swelling with dizziness and weakness sensation   Follow up with PT regarding dizziness and possible BPPV symptoms   Please call with any worsening headaches         Thank you for coming to see [place] at [facility]. I hope we have been able to provide you high quality care today.  You may receive a patient satisfaction survey over the next few weeks. We would appreciate your feedback and comments so that we may continue to improve ourselves and the health of our patients.

## 2023-05-19 NOTE — Progress Notes (Signed)
 Subjective:   PATIENT ID: Becky [name] GENDER: female DOB: 02-16-1951, MRN: [id number]   HPI  Chief Complaint  Patient presents with   Follow-up    Asthma    Reason for Visit: Follow-up   [name] is a 73 year old female with hx COVID 08/2020 followed by post-viral asthma, hx CVA, hx melanoma, HTN, CKD who presents for asthma follow-up  Synopsis: 2022 -She was diagnosed with COVID-19 in July 2022. Denies prior history of asthma or frequent respiratory infections.  2023 - March and May had outpatient exacerbation and hospitalization in July. Flovent>Advair. Improved control of symptoms. 2024 - Well-controlled on high dose Advair until Nov when she had two exacerbations. Recent dizziness and deconditioning affecting her activity level. Hospitalized for angioedema secondary to telmisartan. 2025 - Spiriva added in March  05/19/23 Since our last visit she reports she has worsening shortness of breath and needing albuterol twice a day. Has had bilateral chest tightness. Denies cough or wheezing. Triggered by cold air. Denies fevers, chills.  Asthma Control Test ACT Total Score  01/08/2023  1:12 PM 7  07/24/2022  2:40 PM 24  04/25/2022  3:27 PM 17   2023 Jan Feb Mar April May June July Aug Sept Oct Nov Dec     X  X X        2024 Jan Feb Mar April May June July Aug Sept Oct Nov Dec             XX   2025 Jan Feb Mar April May June July Aug Sept Oct Nov Dec     X                09/11/2021    3:24 PM  Results of the Epworth flowsheet  Sitting and reading 2  Watching TV 1  Sitting, inactive in a public place (e.g. a theatre or a meeting) 0  As a passenger in a car for an hour without a break 1  Lying down to rest in the afternoon when circumstances permit 1  Sitting and talking to someone 0  Sitting quietly after a lunch without alcohol 0  In a car, while stopped for a few minutes in traffic 0  Total score 5    Social History: Never smoker Childhood second  hand smoke exposure  Past Medical History:  Diagnosis Date   Chronic kidney disease    CVA (cerebral vascular accident) (HCC) 11/14/2013   Depression    followed by Dr Evelene Croon   Falls frequently 10/01/2015   Genital herpes    History of melanoma    followed by derm Dr Emily Filbert   Hyperlipidemia    Hypertension    MCI (mild cognitive impairment) 10/01/2015   Pure hypercholesterolemia 11/14/2013   Stroke [facility])    Old stroke seen on MRI 12/2009, carotid doppler w minimal plaque, MRI off the head otherwise neg for acute disease.   Thyroid disease    hypothyroid. Followed by Dr Evelene Croon   TIA (transient ischemic attack) 8/08   followed by neurologist Dr Pearlean Brownie     Family History  Problem Relation Age of Onset   Breast cancer Mother    Hyperlipidemia Father    Hypertension Father    Heart attack Father    Hypertension Sister    Hyperlipidemia Sister    Dementia Maternal Aunt      Social History   Occupational History   Not on file  Tobacco Use   Smoking status:  Never   Smokeless tobacco: Never  Vaping Use   Vaping status: Never Used  Substance and Sexual Activity   Alcohol use: No   Drug use: No   Sexual activity: Never    Allergies  Allergen Reactions   Penicillins Anaphylaxis   Telmisartan     Suspected cause of angioedema    Iodinated Contrast Media Other (See Comments)   Iohexol Other (See Comments)     Desc: iodine    Latex Rash   Nsaids Other (See Comments)    CKD      Outpatient Medications Prior to Visit  Medication Sig Dispense Refill   ALPRAZolam (XANAX) 0.25 MG tablet Take 0.25 mg by mouth 3 (three) times daily as needed for anxiety or sleep.  1   aspirin EC 325 MG tablet Take 650 mg by mouth at bedtime.     desvenlafaxine (PRISTIQ) 100 MG 24 hr tablet Take 100 mg by mouth daily.     hydrochlorothiazide (HYDRODIURIL) 25 MG tablet Take 25 mg by mouth every morning.     levothyroxine (SYNTHROID) 50 MCG tablet Take 50 mcg by mouth daily before breakfast.      nadolol (CORGARD) 20 MG tablet Take 1 tablet (20 mg total) by mouth daily. Patient needs appt for # 90 supply or future refills. Please call office to schedule appt. 30 tablet 0   NONFORMULARY OR COMPOUNDED ITEM Place 1-4 drops into the right eye See admin instructions. Prednisolone 1% / Moxifloxacin 0.5% / Bromfenac 0.075% Opth Soln.  Instill 1 drop into the right eye four times daily for 4 days, then 1 drop twice a day for 2 days, then 1 drop once a day until finished.     OVER THE COUNTER MEDICATION Take 2 tablets by mouth daily as needed. Equate indigestion relief.     pramipexole (MIRAPEX) 0.25 MG tablet Take 0.5 mg by mouth at bedtime.  11   pravastatin (PRAVACHOL) 80 MG tablet Take 80 mg by mouth daily.     amLODipine (NORVASC) 10 MG tablet Take 1 tablet (10 mg total) by mouth daily. 30 tablet 2   No facility-administered medications prior to visit.    Review of Systems  Constitutional:  Negative for chills, diaphoresis, fever, malaise/fatigue and weight loss.  HENT:  Negative for congestion.   Respiratory:  Positive for shortness of breath. Negative for cough, hemoptysis, sputum production and wheezing.   Cardiovascular:  Negative for chest pain (chest tightness), palpitations and leg swelling.     Objective:   Vitals:   05/19/23 1308  BP: 122/76  Pulse: 74  SpO2: 97%  Weight: 207 lb 11 oz (94.2 kg)  Height: 5' (1.524 m)   SpO2: 97 %  Physical Exam: General: Well-appearing, no acute distress HENT: [place], AT Eyes: EOMI, no scleral icterus Respiratory: Clear to auscultation bilaterally.  No crackles, wheezing or rales Cardiovascular: RRR, -M/R/G, no JVD Extremities:-Edema,-tenderness Neuro: AAO x4, CNII-XII grossly intact Psych: Normal mood, normal affect   Data Reviewed:  Imaging: CXR 03/15/21 - Lingular scarring. No infiltrate, effusion or edema CT Chest 08/17/21 - No parenchymal abnormalities  PFT: 05/06/21 FVC 1.88 (74%) FEV1 1.5 (79%) Ratio 81  TLC 127% DLCO  89% Interpretation: No obstructive defect on spirometry however air trapping present and F-V curves suggestive of obstructive defect/small airway disease. Normal gas exchange  Labs: CBC    Component Value Date/Time   WBC 9.3 02/04/2023 0705   RBC 3.81 (L) 02/04/2023 0705   HGB 12.0 05/13/2023 1602  HCT 35.7 05/13/2023 1602   PLT 315 02/04/2023 0705   PLT 341 06/30/2013 1300   MCV 91.6 02/04/2023 0705   MCV 89 06/30/2013 1300   MCH 31.0 02/04/2023 0705   MCHC 33.8 02/04/2023 0705   RDW 12.9 02/04/2023 0705   RDW 13.4 06/30/2013 1300   LYMPHSABS 2.1 02/04/2023 0705   LYMPHSABS 2.7 06/30/2013 1300   MONOABS 1.1 (H) 02/04/2023 0705   MONOABS 1.1 (H) 06/30/2013 1300   EOSABS 0.2 02/04/2023 0705   EOSABS 0.2 06/30/2013 1300   BASOSABS 0.0 02/04/2023 0705   BASOSABS 0.1 06/30/2013 1300   Abs eos 08/12/21 -300 IgE 08/12/21 -131    Assessment & Plan:   Discussion: 73 year old female never smoker with hx COVID 08/2020 followed by post-viral asthma, hx melanoma, HTN, CKD who presents for follow-up. Currently in mild exacerbation today (March) May be triggered by cold, viral, uncontrolled asthma. Adding LAMA to regimen. Deconditioning also remains an issue.  COVID-19 long hauler manifesting in dyspnea Moderate persistent asthma - mild exacerbation Prednisone taper CONTINUE Advair 500-50 ONE puff TWICE a day.  START Spiriva 1.25 mcg TWO puffs in the morning CONTINUE Albuterol or nebulizer AS NEEDED.  STOP singulair  Severe OSA: Fatigue, daytime sleepiness Declined PAP titration study again today Feels like she is sleeping well  Deconditioning/Back pain --CONTINUE outpatient therapy at [facility] --Recommend tylenol and ibuprofen as needed per bottle instructions --Tramadol if the above medications do not alleviate pain  Depression --STOP singulair --Currently in therapy --Consider options with insurance for additional support/therapy. Online options like  [email or web address]  Health Maintenance Immunization History  Administered Date(s) Administered   Fluad Quad(high Dose 65+) 11/20/2021   Influenza Split 12/30/2020   Influenza, High Dose Seasonal PF 11/05/2016, 12/09/2017, 12/16/2018, 12/29/2019   Influenza,inj,quad, With Preservative 10/19/2014, 10/22/2015   Moderna Sars-Covid-2 Vaccination 02/10/2020   PFIZER(Purple Top)SARS-COV-2 Vaccination 05/14/2019, 06/04/2019   Pneumococcal Conjugate-13 11/05/2016   Pneumococcal Polysaccharide-23 12/25/2017   Td 10/19/2014   Zoster Recombinant(Shingrix) 12/01/2017, 11/19/2018   Zoster, Live 12/01/2017, 11/19/2018   CT Lung Screen - not qualified. Never smoker  No orders of the defined types were placed in this encounter.  Meds ordered this encounter  Medications   Tiotropium Bromide Monohydrate (SPIRIVA RESPIMAT) 1.25 MCG/ACT AERS    Sig: Inhale 1 puff into the lungs daily.    Dispense:  4 g    Refill:  11   predniSONE (DELTASONE) 10 MG tablet    Sig: Take 4 tablets (40 mg total) by mouth daily with breakfast for 2 days, THEN 3 tablets (30 mg total) daily with breakfast for 2 days, THEN 2 tablets (20 mg total) daily with breakfast for 2 days, THEN 1 tablet (10 mg total) daily with breakfast for 2 days.    Dispense:  20 tablet    Refill:  0    Return in about 6 months (around 11/19/2023).  I have spent a total time of 35-minutes on the day of the appointment including chart review, data review, collecting history, coordinating care and discussing medical diagnosis and plan with the patient/family. Past medical history, allergies, medications were reviewed. Pertinent imaging, labs and tests included in this note have been reviewed and interpreted independently by me.  Rafiel Mecca Mechele Collin, MD [facility] 05/19/2023 2:56 PM  Office Number [phone]

## 2023-05-19 NOTE — Progress Notes (Signed)
 [facility] [address] [place]. [place] [address] [phone]       OFFICE FOLLOW UP NOTE  Becky [name] Date of Birth:  [birth year] Medical Record Number:  [id number]   Referring MD: Mechele Collin  Reason for Referral: Daily headaches   Chief Complaint  Patient presents with   Headache    Rm 20 alone Pt is well, reports she has about 1-2 headaches a month. Pt also mentions she is having dizziness and tongue swelling.       HPI: Becky [name] is a 73 year old pleasant Caucasian lady seen today for consultation visit for headache.  History is obtained from the patient and review of electronic medical records and I reviewed pertinent available imaging films in PACS.  She has longstanding history of anxiety depression, chronic kidney disease, hypertension, hyperlipidemia old stroke in October 2011, TIA and hypothyroidism.  Patient states that she had COVID illness in July 2022 following which she has had daily headaches as well as nausea which is unrelenting and not going away.  Describes the headache as being bifrontal constant.  He describes the quality activity vomiting.  The headache is moderate 5-6/10 in severity.  She has constant nausea.  She does not vomit.  The headache increases with physical activity and she is unable to continue with work he has to lie down.  She feels better when she sleeps.  She has been taking 2 tablets of over-the-counter headache pills on a daily basis but to provide only short-term benefit.  She has not taken any daily headache prophylactic medications.  She has not had any brain imaging done.  She denies any visual symptoms, blurred vision, focal extremity weakness and minutes.  She does have longstanding history of mild short-term memory difficulties .  She was seen last in office in April 2021 by Shanda Bumps nurse practitioner.  He states that short-term memory and cognitive difficulties are unchanged and nonprogressive.  She has not  been participating in any regular cognitively challenging activities . Prior visit 10/01/2015 : [name] is a 73 year lady with long-standing history of anxiety depression who for the last 1 year or so has had progressive memory and cognitive difficulties as well as increased frequency of falls and balance problems. She is accompanied today by a cousin [name]. Patient admits she did have some mild cognitive memory problems even prior to a year but since she got ECT in June 2016 this seems to have been more pronounced. She got only 4 treatments every other day. 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 She gives several examples like going to plan to change direct drops on her checking account but once she reached that she was unable to tell the bank arrest why she had come. On occasion while driving home from a friend's house she could not remember the way home and got lost. 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 She denies any headaches, extremity weakness. She is also had increased frequency of falls and states that she falls without any warning. She does not feel like she didn't pass out or have any syncopal symptoms. She falls all of a sudden. She is able to stop herself sometimes but has bruised herself. She has never lost consciousness. She denies any tingling numbness pain or burning  in her feet. She denies walking like a drunk. She has no history of significant head injury with loss of consciousness. She has no family history of dementia. She lives alone she is now getting some help with finances but needs to keep a calendar calendar to write things down so she does not forget. She has a remote history of tiny (lacunar infarct in  September 2008. MRA of the brain showed only mild atherosclerotic changes She presented at that  time the 30 minute episode of right-sided facial numbness and slurred speech. I had seen her in the hospital at that time but she was lost to follow-up. She states she's had no further recurrent stroke or TIA symptoms.  Update 06/29/2019 JM: Becky [name] returns for follow-up regarding mild cognitive impairment with prior stroke history.  Initial evaluation with Dr. Pearlean Brownie in 09/2015 due to progressive memory and cognitive difficulties along with increase frequency of falls and balance problems.  Extensive imaging and lab work-up unremarkable for any reversible causes.  Memory has been stable since prior visit 1 year ago with Dr. Pearlean Brownie without worsening.  No reoccurring gait or balance concerns.  She denies recurrent stroke or TIA symptoms since 2008.  Continues on aspirin for secondary stroke prevention.  Crestor currently on hold due to elevated liver enzymes currently being followed by [facility] gastroenterology and PCP who plans on repeat lab work around June/July.  Blood pressure today 137/85.  No concerns at this time.    Update 01/29/2022 Dr. Pearlean Brownie: Becky [name] is a 73 year old pleasant Caucasian lady seen today for consultation visit for headache.  History is obtained from the patient and review of electronic medical records and I reviewed pertinent available imaging films in PACS.  She has longstanding history of anxiety depression, chronic kidney disease, hypertension, hyperlipidemia old stroke in October 2011, TIA and hypothyroidism.  Patient states that she had COVID illness in July 2022 following which she has had daily headaches as well as nausea which is unrelenting and not going away.  Describes the headache as being bifrontal constant.  He describes the quality activity vomiting.  The headache is moderate 5-6/10 in severity.  She has constant nausea.  She does not vomit.  The headache increases with physical activity and she is unable to continue with work he has to lie down.  She feels better when she  sleeps.  She has been taking 2 tablets of over-the-counter headache pills on a daily basis but to provide only short-term benefit.  She has not taken any daily headache prophylactic medications.  She has not had any brain imaging done.  She denies any visual symptoms, blurred vision, focal extremity weakness and minutes.  She does have longstanding history of mild short-term memory difficulties .  She was seen last in office in April 2021 by Shanda Bumps nurse practitioner.  He states that short-term memory and cognitive difficulties are unchanged and nonprogressive.  She has not been participating in any regular cognitively challenging activities .   Update 05/05/2022 JM: Patient returns for headache follow-up.  She was started on topiramate 25 mg twice daily at prior visit as well as recommended completion of brain MRI.  Reports improvement of headaches since prior visit, only occurring on occasion currently, tolerating topiramate well. Has limited OTC use. Recently started saline eye drops which she believes has also helped further improve headaches. MR brian not yet completed.      Update 05/19/2023 JM: Patient returns for follow-up visit after prior visit 1 year  ago.  Since prior visit, she self discontinued topiramate due to resolution of headaches.  She has had very occasional mild headaches which resolved after use of Tylenol.  MRI brain 07/2022 which showed progression of perisylvian and mesial parietal atrophy as well as moderate small vessel disease and left frontal encephalomalacia and gliosis, no acute findings noted.  She was hospitalized back in December for angioedema felt secondary to telmisartan which was discontinued and started on amlodipine.  Reports episode of mild tongue swelling this morning (without any other symptoms) and resolved without intervention. Was seen by per PCP this morning.  No clear trigger identified.  Reports sensation of dizziness and generalized weakness after tongue  swelling.  She is currently working with PT for generalized weakness complaints.  She has been having episodes of dizziness almost daily over the past year.  Was seen by cardiology last week who noted positive orthostatics, amlodipine discontinued and placed on hydrochlorothiazide.  She has difficult time describing her dizziness, feels like she may fall when standing but denies being pulled to one side or swaying while walking, reports more of a lightheadedness but denies feeling like she may pass out, denies spinning sensation, does have some increased hearing loss, can have increased symptoms when turning head quickly towards the left.      ROS:   14 system review of systems is positive for those listed in HPI and all other systems negative  PMH:  Past Medical History:  Diagnosis Date   Chronic kidney disease    CVA (cerebral vascular accident) (HCC) 11/14/2013   Depression    followed by Dr Arletha Pili frequently 10/01/2015   Genital herpes    History of melanoma    followed by derm Dr Emily Filbert   Hyperlipidemia    Hypertension    MCI (mild cognitive impairment) 10/01/2015   Pure hypercholesterolemia 11/14/2013   Stroke [facility])    Old stroke seen on MRI 12/2009, carotid doppler w minimal plaque, MRI off the head otherwise neg for acute disease.   Thyroid disease    hypothyroid. Followed by Dr Evelene Croon   TIA (transient ischemic attack) 8/08   followed by neurologist Dr Pearlean Brownie    Social History:  Social History   Socioeconomic History   Marital status: Single    Spouse name: Not on file   Number of children: Not on file   Years of education: Not on file   Highest education level: Not on file  Occupational History   Not on file  Tobacco Use   Smoking status: Never   Smokeless tobacco: Never  Vaping Use   Vaping status: Never Used  Substance and Sexual Activity   Alcohol use: No   Drug use: No   Sexual activity: Never  Other Topics Concern   Not on file  Social History  Narrative   Not on file   Social Drivers of Health   Financial Resource Strain: Not on file  Food Insecurity: No Food Insecurity (02/02/2023)   Hunger Vital Sign    Worried About Running Out of Food in the Last Year: Never true    Ran Out of Food in the Last Year: Never true  Transportation Needs: No Transportation Needs (02/02/2023)   PRAPARE - Administrator, Civil Service (Medical): No    Lack of Transportation (Non-Medical): No  Physical Activity: Not on file  Stress: Not on file  Social Connections: Not on file  Intimate Partner Violence: Not At Risk (02/02/2023)  Humiliation, Afraid, Rape, and Kick questionnaire    Fear of Current or Ex-Partner: No    Emotionally Abused: No    Physically Abused: No    Sexually Abused: No    Medications:   Current Outpatient Medications on File Prior to Visit  Medication Sig Dispense Refill   acyclovir (ZOVIRAX) 200 MG capsule as needed.     albuterol (VENTOLIN HFA) 108 (90 Base) MCG/ACT inhaler SMARTSIG:1 Puff(s) Via Inhaler Every 4 Hours PRN     ALPRAZolam (XANAX) 0.25 MG tablet Take 0.25 mg by mouth 3 (three) times daily as needed for anxiety or sleep.  1   ARIPiprazole (ABILIFY) 5 MG tablet Take 5 mg by mouth at bedtime.     aspirin EC 325 MG tablet Take 650 mg by mouth at bedtime.     desvenlafaxine (PRISTIQ) 100 MG 24 hr tablet Take 100 mg by mouth daily.     hydrochlorothiazide (HYDRODIURIL) 25 MG tablet Take 25 mg by mouth every morning.     levothyroxine (SYNTHROID) 50 MCG tablet Take 50 mcg by mouth daily before breakfast.     nadolol (CORGARD) 20 MG tablet Take 1 tablet (20 mg total) by mouth daily. Patient needs appt for # 90 supply or future refills. Please call office to schedule appt. 30 tablet 0   NONFORMULARY OR COMPOUNDED ITEM Place 1-4 drops into the right eye See admin instructions. Prednisolone 1% / Moxifloxacin 0.5% / Bromfenac 0.075% Opth Soln.  Instill 1 drop into the right eye four times daily for 4 days,  then 1 drop twice a day for 2 days, then 1 drop once a day until finished.     OVER THE COUNTER MEDICATION Take 2 tablets by mouth daily as needed. Equate indigestion relief.     pramipexole (MIRAPEX) 0.25 MG tablet Take 0.5 mg by mouth at bedtime.  11   pravastatin (PRAVACHOL) 80 MG tablet Take 80 mg by mouth daily.     traZODone (DESYREL) 50 MG tablet Take 50-150 mg by mouth at bedtime as needed.     No current facility-administered medications on file prior to visit.    Allergies:   Allergies  Allergen Reactions   Penicillins Anaphylaxis   Telmisartan     Suspected cause of angioedema    Iodinated Contrast Media Other (See Comments)   Iohexol Other (See Comments)     Desc: iodine    Latex Rash   Nsaids Other (See Comments)    CKD     Physical Exam Today's Vitals   05/19/23 1521  BP: (!) 152/89  Pulse: 66  Weight: 208 lb (94.3 kg)  Height: 5' (1.524 m)   Body mass index is 40.62 kg/m.  General:Pleasant elderly Caucasian lady seated, in no evident distress Head: head normocephalic and atraumatic.   Neck: supple with no carotid or supraclavicular bruits Cardiovascular: regular rate and rhythm, no murmurs Musculoskeletal: no deformity Skin:  no rash/petichiae Vascular:  Normal pulses all extremities  Neurologic Exam Mental Status: Awake and fully alert.  Fluent speech and language.  Oriented to place and time. Recent memory mildly impaired and remote memory intact. Attention span, concentration and fund of knowledge appropriate. Mood and affect appropriate.  Cranial Nerves: Pupils equal, briskly reactive to light. Extraocular movements full without nystagmus but dysmetric saccades when looking to the left. Visual fields full to confrontation. Hearing intact. Facial sensation intact. Face, tongue, palate moves normally and symmetrically.  Motor: Normal bulk and tone. Normal strength in all tested extremity muscles. Sensory.:  intact to touch , pinprick , position and  vibratory sensation.  Coordination: Rapid alternating movements normal in all extremities. Finger-to-nose and heel-to-shin performed accurately bilaterally. Gait and Station: Arises from chair without difficulty. Stance is normal. Gait demonstrates slow cautious gait with mild unsteadiness and use of cane.  Tandem walk and heel toe attempted Reflexes: 1+ and symmetric. Toes downgoing.         ASSESSMENT/PLAN: 73 year old Caucasian lady with new onset daily persistent headache following COVID illness in July 2022 likely muscle tension headache with analgesic rebound.  Complains of almost daily dizziness over the past year.  Sensation of generalized weakness since hospitalization in December with angioedema 2/2 medication reaction.     Chronic daily headache, resolved -No recent headaches, has since self discontinued topiramate -Okay to use Tylenol occasionally as needed for mild headaches -Advised to call with any worsening headaches  Dizziness Generalized weakness, deconditioning -Present over the past 6 to 12 months -Positive orthostatics at recent cardiology visit possibly contributing -Question BPPV, will reach out to her current PT for possible vestibular therapy/exercises -Continue working with PT for generalized strength training, unable to appreciate any significant weakness on exam today -Prior MRI brain 07/2022 without acute abnormality or significant concerning findings -02/2023 CT head no acute abnormality, CTA head/neck moderate right PCA stenosis otherwise unremarkable  Tongue swelling -Episode of angioedema 02/2023 2/2 medication allergic reaction -Recurrent mild tongue swelling this morning without clear trigger, no speech changes, facial weakness or tongue weakness, no other associated neurodeficits, experience episode of dizziness and generalized weakness sensation after event although exam today largely benign, will discuss further with Dr. Pearlean Brownie for any further  input -encouraged continued follow-up with PCP for ongoing monitoring     No further recommendations from neurological standpoint at this time.  Recommend follow-up on an as-needed basis.    I spent 30 minutes of face-to-face and non-face-to-face time with patient.  This included previsit chart review, lab review, study review, order entry, electronic health record documentation, patient education and discussion regarding above diagnoses and treatment plan and answered all other questions to patient's satisfaction  Ihor Austin, [facility]  [facility] [address] [place], [place] [address]  Phone [phone] Fax [phone] Note: This document was prepared with digital dictation and possible smart phrase technology. Any transcriptional errors that result from this process are unintentional.

## 2023-05-20 ENCOUNTER — Encounter: Payer: Self-pay | Admitting: Physician Assistant

## 2023-05-20 ENCOUNTER — Encounter (HOSPITAL_BASED_OUTPATIENT_CLINIC_OR_DEPARTMENT_OTHER): Payer: No Typology Code available for payment source | Admitting: Physical Therapy

## 2023-05-20 ENCOUNTER — Ambulatory Visit: Attending: Physician Assistant | Admitting: Physician Assistant

## 2023-05-20 ENCOUNTER — Ambulatory Visit (HOSPITAL_COMMUNITY)

## 2023-05-20 VITALS — BP 126/80 | HR 65 | Ht 60.0 in | Wt 207.0 lb

## 2023-05-20 DIAGNOSIS — R9431 Abnormal electrocardiogram [ECG] [EKG]: Secondary | ICD-10-CM

## 2023-05-20 NOTE — Patient Instructions (Signed)
 Medication Instructions:   Your physician recommends that you continue on your current medications as directed. Please refer to the Current Medication list given to you today.   *If you need a refill on your cardiac medications before your next appointment, please call your pharmacy*   Lab Work:  NONE ORDERED  TODAY   If you have labs (blood work) drawn today and your tests are completely normal, you will receive your results only by: MyChart Message (if you have MyChart) OR A paper copy in the mail If you have any lab test that is abnormal or we need to change your treatment, we will call you to review the results.   Testing/Procedures:  NONE ORDERED  TODAY   Follow-Up: At [facility], you and your health needs are our priority.  As part of our continuing mission to provide you with exceptional heart care, we have created designated Provider Care Teams.  These Care Teams include your primary Cardiologist (physician) and Advanced Practice Providers (APPs -  Physician Assistants and Nurse Practitioners) who all work together to provide you with the care you need, when you need it.  We recommend signing up for the patient portal called "MyChart".  Sign up information is provided on this After Visit Summary.  MyChart is used to connect with patients for Virtual Visits (Telemedicine).  Patients are able to view lab/test results, encounter notes, upcoming appointments, etc.  Non-urgent messages can be sent to your provider as well.   To learn more about what you can do with MyChart, go to [email or web address].    Your next appointment:   1 year(s)  Provider:   You may see Will Jorja Loa, MD   Other Instructions

## 2023-05-21 ENCOUNTER — Ambulatory Visit (HOSPITAL_BASED_OUTPATIENT_CLINIC_OR_DEPARTMENT_OTHER): Payer: No Typology Code available for payment source | Admitting: Physical Therapy

## 2023-05-21 NOTE — Progress Notes (Signed)
 I agree with the above plan

## 2023-05-22 ENCOUNTER — Encounter (HOSPITAL_BASED_OUTPATIENT_CLINIC_OR_DEPARTMENT_OTHER): Payer: No Typology Code available for payment source | Admitting: Physical Therapy

## 2023-05-23 ENCOUNTER — Ambulatory Visit (HOSPITAL_BASED_OUTPATIENT_CLINIC_OR_DEPARTMENT_OTHER): Payer: No Typology Code available for payment source | Admitting: Physical Therapy

## 2023-05-23 NOTE — Progress Notes (Signed)
 I agree with the above plan

## 2023-05-26 ENCOUNTER — Ambulatory Visit (HOSPITAL_COMMUNITY)

## 2023-05-26 ENCOUNTER — Telehealth: Payer: Self-pay

## 2023-05-26 ENCOUNTER — Telehealth (HOSPITAL_COMMUNITY): Payer: Self-pay | Admitting: Cardiology

## 2023-05-26 NOTE — Telephone Encounter (Signed)
 Patient cancelled echocardiogram for the reason below:  05/25/2023 4:57 PM By: Andreas Blower Cancel Rsn: Patient (Pt said something has come up and she will c/b to r/s)  Order will be removed from the echo WQ. If patient calls back we will reinstate the order. Thank you.

## 2023-05-26 NOTE — Telephone Encounter (Signed)
 Detailed instructions left on the patient's answering machine. Asked to call back with any questions.  S.Alessa Mazur CCT

## 2023-05-28 ENCOUNTER — Ambulatory Visit (HOSPITAL_BASED_OUTPATIENT_CLINIC_OR_DEPARTMENT_OTHER): Admitting: Physical Therapy

## 2023-05-28 ENCOUNTER — Encounter (HOSPITAL_BASED_OUTPATIENT_CLINIC_OR_DEPARTMENT_OTHER): Payer: Self-pay | Admitting: Physical Therapy

## 2023-05-28 DIAGNOSIS — M6281 Muscle weakness (generalized): Secondary | ICD-10-CM | POA: Diagnosis not present

## 2023-05-28 DIAGNOSIS — M5459 Other low back pain: Secondary | ICD-10-CM | POA: Diagnosis not present

## 2023-05-28 DIAGNOSIS — R2689 Other abnormalities of gait and mobility: Secondary | ICD-10-CM | POA: Diagnosis not present

## 2023-05-28 DIAGNOSIS — R2681 Unsteadiness on feet: Secondary | ICD-10-CM

## 2023-05-28 NOTE — Therapy (Signed)
 OUTPATIENT PHYSICAL THERAPY THORACOLUMBAR TREATMENT   Patient Name: Becky [name] MRN: [id number] DOB:1950/11/20, 73 y.o., female Today's Date: 05/28/2023  Progress Note   Reporting Period 03/24/23 to 05/28/23   See note below for Objective Data and Assessment of Progress/Goals   END OF SESSION:  PT End of Session - 05/28/23 1439     Visit Number 5    Number of Visits 28    Date for PT Re-Evaluation 07/23/23    Authorization Type [employer] MEDICARE    Progress Note Due on Visit 15    PT Start Time 1437    PT Stop Time 1515    PT Time Calculation (min) 38 min    Activity Tolerance Patient tolerated treatment well    Behavior During Therapy WFL for tasks assessed/performed             Past Medical History:  Diagnosis Date   Chronic kidney disease    CVA (cerebral vascular accident) (HCC) 11/14/2013   Depression    followed by Dr Arletha Pili frequently 10/01/2015   Genital herpes    History of melanoma    followed by derm Dr Emily Filbert   Hyperlipidemia    Hypertension    MCI (mild cognitive impairment) 10/01/2015   Pure hypercholesterolemia 11/14/2013   Stroke (HCC)    Old stroke seen on MRI 12/2009, carotid doppler w minimal plaque, MRI off the head otherwise neg for acute disease.   Thyroid disease    hypothyroid. Followed by Dr Evelene Croon   TIA (transient ischemic attack) 8/08   followed by neurologist Dr Pearlean Brownie   Past Surgical History:  Procedure Laterality Date   ABDOMINAL HYSTERECTOMY     APPENDECTOMY     CHOLECYSTECTOMY     MELANOMA EXCISION     x2   Patient Active Problem List   Diagnosis Date Noted   Angioedema 02/01/2023   Chronic fatigue 09/11/2021   Shortness of breath at rest 08/17/2021   Moderate persistent asthma 05/06/2021   History of COVID-19 03/19/2021   COVID-19 long hauler manifesting chronic dyspnea 03/19/2021   Thyroid disease    Stroke [facility])    Hypertension    Hyperlipidemia    History of melanoma    Genital herpes    Depression     Chronic kidney disease    MCI (mild cognitive impairment) 10/01/2015   Falls frequently 10/01/2015   Essential hypertension, benign 11/14/2013   CVA (cerebral vascular accident) (HCC) 11/14/2013   Pure hypercholesterolemia 11/14/2013   TIA (transient ischemic attack) 10/02/2006    PCP: Jackelyn Poling MD  REFERRING PROVIDER: Luciano Cutter, MD   REFERRING DIAG: M54.50 (ICD-10-CM) - Acute bilateral low back pain without sciatica   Rationale for Evaluation and Treatment: Rehabilitation  THERAPY DIAG:  Muscle weakness (generalized)  Unsteadiness on feet  Other low back pain  Other abnormalities of gait and mobility  ONSET DATE: 3 months  SUBJECTIVE:  SUBJECTIVE STATEMENT: Patient states some days with increased fatigue but unsure why. Did some stairs, was really careful doing them. HEP going well. Dizziness has improved since coming of of Amlodipine, but still present mostly in the morning and intermittently during day. Symptoms of dizziness present for long durations and has it both when sitting and laying. Symptoms not related to head movements. Fatigues quickly though tasks.   PERTINENT HISTORY:  Recert hospitalization due to allergic reaction to med.  Strengthening/deconditioning post hospital. Interested in pool therapy  CVA 2011  PAIN:  Are you having pain? No  PRECAUTIONS: None  RED FLAGS: None   WEIGHT BEARING RESTRICTIONS: No  FALLS:  Has patient fallen in last 6 months? No  LIVING ENVIRONMENT: Lives with: lives alone Lives in: House/apartment Stairs: No Has following equipment at home: Single point cane  OCCUPATION: retired  PLOF: Independent  PATIENT GOALS: improve strength, improve endurance and breathing  NEXT MD VISIT: tomorrow  OBJECTIVE:  Note: Objective  measures were completed at Evaluation unless otherwise noted.  DIAGNOSTIC FINDINGS:  none  PATIENT SURVEYS:  FOTO Primary measure 94% with goal of 81%  COGNITION: Overall cognitive status: Within functional limits for tasks assessed     SENSATION: WFL  MUSCLE LENGTH: Hamstrings: wfl tightness L>R   POSTURE: slight rounded shoulders, slight forward head, and increased thoracic kyphosis  PALPATION: No TTP  LUMBAR ROM:   WFL  LOWER EXTREMITY ROM:     WFL  LOWER EXTREMITY MMT:    MMT Right eval Left eval  Hip flexion 39.0 35.8  Hip extension    Hip abduction 23.6 26.7  Hip adduction    Hip internal rotation    Hip external rotation    Knee flexion    Knee extension 28.3 31.3  Ankle dorsiflexion    Ankle plantarflexion    Ankle inversion    Ankle eversion     (Blank rows = not tested)  LUMBAR SPECIAL TESTS:  Slump test: Negative  FUNCTIONAL TESTS:  5 times sit to stand: 21.45 from pool bench Timed up and go (TUG): 28.03 Berg Balance Scale: 30/56  GAIT: Distance walked: 400 ft Assistive device utilized: Single point cane Level of assistance: Modified independence Comments: Slowed cadence use of cane   Reassessment  05/28/23:  FUNCTIONAL TESTS:  5 times sit to stand: 12.16 seconds without UE support, mild dynamic knee valgus Timed up and go (TUG): 20.41 seconds Berg Balance Scale: 43/56 BERG  1. SITTING TO STANDING  4 able to stand without using hands and stabilize independently  2. STANDING UNSUPPORTED  4 able to stand safely for 2 minutes  If a subject is able to stand 2 minutes unsupported, score full points for sitting unsupported. Proceed to item #4  3. SITTING WITH BACK UNSUPPORTED BUT FEET SUPPORTED ON FLOOR OR ON A STOOL 4 able to sit safely and securely for 2 minutes  4. STANDING TO SITTING 4 sits safely with minimal use of hands  5. TRANSFERS 4 able to transfer safely with minor use of hands  6. STANDING UNSUPPORTED WITH EYES  CLOSED 4 able to stand 10 seconds safely   7. STANDING UNSUPPORTED WITH FEET TOGETHER 4 able to place feet together independently and stand 1 minute safely  8. REACHING FORWARD WITH OUTSTRETCHED ARM WHILE STANDING  3 can reach forward 12 cm (5 inches)  9. PICK UP OBJECT FROM THE FLOOR FROM A STANDING POSITION  3 able to pick up slipper but needs supervision  10. TURNING TO  LOOK BEHIND OVER LEFT AND RIGHT SHOULDERS WHILE STANDING  3 looks behind one side only other side shows less weight shift  11. TURN 360 DEGREES 2 able to turn 360 degrees safely but slowly  12. PLACE ALTERNATE FOOT ON STEP OR STOOL WHILE STANDING UNSUPPORTED 2 able to complete 4 steps without aid with supervision  13. STANDING UNSUPPORTED ONE FOOT IN FRONT 1 needs help to step but can hold 15 seconds  14. STANDING ON ONE LEG 1 tries to lift leg unable to hold 3 seconds but remains standing independently  TOTAL SCORE  43/56 41-56 = independent           TREATMENT  05/28/23 Reassessment  05/14/23 117/95 mmHg HR 79 BPM LAQ 2# 1 x 10 with 5 second holds Seated march 2# 2 x 10 Standing hip abduction  2# 2 x 10 Step up 4 inch 2 x 10 Seated row GTB 2 x 10  3/11 **2 pillows under head with supine** LTR x10ea 3" hold Hooklying PPT 3" x10 Hooklying marches x10ea with TrA Hooklying clams GTB 2x10 Nu-step L 3 x49min RPE level 5  End of session: 64bpm 95-96%O2 121/103mmHg     Previous: Self care: pt instruction and edu on increasing walking activities at home; discussed benefits of pool therapy;completing land based intervention as tolerated ; encouraging comfort level for progressing towards goals.  Pt schedule changed to reflect just land based intervention  Nu-step 2 minute intervals with 1-2 minute rest period between x 20 minutes.  Pt monitored throughout to ensure approp exertion level not exceeded                                                                                                                            PATIENT EDUCATION:  Education details: Discussed eval findings, rehab rationale, aquatic program progression/POC and pools in area. Patient is in agreement  Person educated: Patient Education method: Explanation Education comprehension: verbalized understanding  HOME EXERCISE PROGRAM: Access Code: [email or web address] URL: [email or web address]   ASSESSMENT:  CLINICAL IMPRESSION: Patient has met 1/4 short term goals with functional mobility. Remaining goals not met due to continued deficits in strength, balance, functional mobility. Patient with impaired balance and remains at an increased risk for falls. 2 goals deferred as patient completing land and not aquatic therapy. Patient has made good progress toward remaining goals. Extending POC 2x/week for 8 weeks to work toward remaining goals. Patient will continue to benefit from skilled physical therapy in order to improve function and reduce impairment.      Initial impression Patient is a 73 y.o. f who was seen today for physical therapy evaluation and treatment for LBP which has since resolved from date of referral.  She reports main concern is residual general weakness from bout of LBP that lasted for a few weeks and long covid (2022).  She presents today with strength and balance deficits as indicated by objective and functional testing.  She report some slight SOB  with distance walked from parkinglot and general fatigue.  She is a good candidate for skilled PT to improve all deficits which are decreasing her functional mobility and safety as well as QOL.  Will begin in aquatics then add land based for increased loading once initial strengthening has begun.  OBJECTIVE IMPAIRMENTS: Abnormal gait, decreased activity tolerance, decreased balance, decreased endurance, decreased mobility, decreased strength, obesity, and pain.   ACTIVITY LIMITATIONS: carrying, lifting, stairs, transfers, and locomotion  level  PARTICIPATION LIMITATIONS: shopping, community activity, and yard work  PERSONAL FACTORS: Age, Fitness, Past/current experiences, Time since onset of injury/illness/exacerbation, and 3+ comorbidities: see PmHx   are also affecting patient's functional outcome.   REHAB POTENTIAL: Good  CLINICAL DECISION MAKING: Evolving/moderate complexity  EVALUATION COMPLEXITY: Moderate   GOALS: Goals reviewed with patient? No  SHORT TERM GOALS: Target date: 05/23/23  Pt will tolerate full aquatic sessions consistently without increase in pain and with improving function to demonstrate good toleration and effectiveness of intervention.  Baseline: Goal status: deferred - patient not completing aquatics  2.  Pt will tolerate walking to and from setting along with completion of aquatic session without excessive fatigue or addition of pain Baseline:  Goal status: deferred - patient not completing aquatics  3.  Pt will improve on 5 X STS test to <or=17s ((MDC)  to demonstrate improving functional lower extremity strength, transitional movements, and balance Baseline: 21.45 Goal status: MET  4.  Pt will improve on Berg balance test to >/= 45/56 to demonstrate a decrease in fall risk. Baseline: 30/56 Goal status: INITIAL  5.  Pt will improve on Tug test to <or=  20s to demonstrate improvement in lower extremity function, mobility and decreased fall risk. Baseline: 28.03 Goal status: INITIAL  6.  Pt will be indep with final HEP's (land and aquatic as appropriate) for continued management of condition Baseline: TBA Goal status: INITIAL  LONG TERM GOALS: will se at re-cert as approp  PLAN:  PT FREQUENCY: 1-2x/week  PT DURATION: 8 weeks 12 visits  PLANNED INTERVENTIONS: 97164- PT Re-evaluation, 97110-Therapeutic exercises, 97530- Therapeutic activity, 97112- Neuromuscular re-education, 97535- Self Care, 16109- Manual therapy, [id number]- Gait training, [phone]- Orthotic Fit/training, [phone]-  Aquatic Therapy, 97014- Electrical stimulation (unattended), [phone]- Ionotophoresis 4mg /ml Dexamethasone, Patient/Family education, Balance training, Stair training, Taping, Dry Needling, Joint mobilization, DME instructions, Cryotherapy, and Moist heat.  PLAN FOR NEXT SESSION: aquatic and land: general strengthening; balance and gait retraining; core stabilization; aerobic capacity training    Wyman Songster, PT 05/28/2023, 3:19 PM  [facility] [address] [place], [place], [address] Phone: [phone]   Fax:  [phone]

## 2023-06-02 ENCOUNTER — Ambulatory Visit (HOSPITAL_COMMUNITY)

## 2023-06-02 NOTE — Progress Notes (Signed)
 Yes, her complaints regarding dizziness were not always consistent so this can make things challenging.  I appreciate you taking a look at her and weighing in on her symptoms! Thank you!

## 2023-06-03 ENCOUNTER — Telehealth (HOSPITAL_BASED_OUTPATIENT_CLINIC_OR_DEPARTMENT_OTHER): Payer: Self-pay | Admitting: Physical Therapy

## 2023-06-03 ENCOUNTER — Ambulatory Visit (HOSPITAL_BASED_OUTPATIENT_CLINIC_OR_DEPARTMENT_OTHER): Attending: Pulmonary Disease | Admitting: Physical Therapy

## 2023-06-03 DIAGNOSIS — M6281 Muscle weakness (generalized): Secondary | ICD-10-CM | POA: Insufficient documentation

## 2023-06-03 DIAGNOSIS — R2689 Other abnormalities of gait and mobility: Secondary | ICD-10-CM | POA: Insufficient documentation

## 2023-06-03 DIAGNOSIS — M5459 Other low back pain: Secondary | ICD-10-CM | POA: Insufficient documentation

## 2023-06-03 DIAGNOSIS — R2681 Unsteadiness on feet: Secondary | ICD-10-CM | POA: Insufficient documentation

## 2023-06-03 NOTE — Telephone Encounter (Signed)
 Patient no show, left message for patient to make her aware of missed appointment and reminded of next appointment.   3:09 PM, 06/03/23 Wyman Songster PT, DPT Physical Therapist at [facility]

## 2023-06-05 DIAGNOSIS — H35033 Hypertensive retinopathy, bilateral: Secondary | ICD-10-CM | POA: Diagnosis not present

## 2023-06-05 DIAGNOSIS — H35371 Puckering of macula, right eye: Secondary | ICD-10-CM | POA: Diagnosis not present

## 2023-06-05 DIAGNOSIS — H34831 Tributary (branch) retinal vein occlusion, right eye, with macular edema: Secondary | ICD-10-CM | POA: Diagnosis not present

## 2023-06-05 DIAGNOSIS — H43813 Vitreous degeneration, bilateral: Secondary | ICD-10-CM | POA: Diagnosis not present

## 2023-06-05 DIAGNOSIS — H04123 Dry eye syndrome of bilateral lacrimal glands: Secondary | ICD-10-CM | POA: Diagnosis not present

## 2023-06-06 ENCOUNTER — Encounter (HOSPITAL_BASED_OUTPATIENT_CLINIC_OR_DEPARTMENT_OTHER): Admitting: Physical Therapy

## 2023-06-07 ENCOUNTER — Telehealth: Payer: Self-pay | Admitting: Pulmonary Disease

## 2023-06-07 ENCOUNTER — Encounter: Payer: Self-pay | Admitting: Pulmonary Disease

## 2023-06-07 MED ORDER — PREDNISONE 20 MG PO TABS: 20.0000 mg | ORAL_TABLET | Freq: Every day | ORAL | 0 refills | Status: DC

## 2023-06-07 NOTE — Telephone Encounter (Signed)
 Patient called answering service for feeling more short of breath  Some cough, not really bringing up any secretions Not feeling otherwise acutely ill just more short of breath the last few days  Will call in prescription for prednisone 20 daily for 7 days  Call the office if not feeling better

## 2023-06-09 ENCOUNTER — Encounter (HOSPITAL_COMMUNITY): Payer: Self-pay

## 2023-06-10 ENCOUNTER — Ambulatory Visit (HOSPITAL_BASED_OUTPATIENT_CLINIC_OR_DEPARTMENT_OTHER): Admitting: Physical Therapy

## 2023-06-11 DIAGNOSIS — R140 Abdominal distension (gaseous): Secondary | ICD-10-CM | POA: Diagnosis not present

## 2023-06-11 DIAGNOSIS — K5900 Constipation, unspecified: Secondary | ICD-10-CM | POA: Diagnosis not present

## 2023-06-11 DIAGNOSIS — R109 Unspecified abdominal pain: Secondary | ICD-10-CM | POA: Diagnosis not present

## 2023-06-12 ENCOUNTER — Emergency Department (HOSPITAL_BASED_OUTPATIENT_CLINIC_OR_DEPARTMENT_OTHER)
Admission: EM | Admit: 2023-06-12 | Discharge: 2023-06-12 | Disposition: A | Discharge status: Home / Self Care | Attending: Emergency Medicine | Admitting: Emergency Medicine

## 2023-06-12 ENCOUNTER — Encounter (HOSPITAL_BASED_OUTPATIENT_CLINIC_OR_DEPARTMENT_OTHER): Admitting: Physical Therapy

## 2023-06-12 ENCOUNTER — Encounter (HOSPITAL_BASED_OUTPATIENT_CLINIC_OR_DEPARTMENT_OTHER): Payer: Self-pay | Admitting: Emergency Medicine

## 2023-06-12 ENCOUNTER — Emergency Department (HOSPITAL_BASED_OUTPATIENT_CLINIC_OR_DEPARTMENT_OTHER)

## 2023-06-12 ENCOUNTER — Other Ambulatory Visit: Payer: Self-pay

## 2023-06-12 DIAGNOSIS — K449 Diaphragmatic hernia without obstruction or gangrene: Secondary | ICD-10-CM | POA: Diagnosis not present

## 2023-06-12 DIAGNOSIS — R1084 Generalized abdominal pain: Secondary | ICD-10-CM | POA: Diagnosis not present

## 2023-06-12 DIAGNOSIS — J45909 Unspecified asthma, uncomplicated: Secondary | ICD-10-CM | POA: Diagnosis not present

## 2023-06-12 DIAGNOSIS — Z7982 Long term (current) use of aspirin: Secondary | ICD-10-CM | POA: Diagnosis not present

## 2023-06-12 DIAGNOSIS — Z7951 Long term (current) use of inhaled steroids: Secondary | ICD-10-CM | POA: Diagnosis not present

## 2023-06-12 DIAGNOSIS — R110 Nausea: Secondary | ICD-10-CM | POA: Diagnosis not present

## 2023-06-12 DIAGNOSIS — Z91040 Latex allergy status: Secondary | ICD-10-CM | POA: Insufficient documentation

## 2023-06-12 DIAGNOSIS — R0602 Shortness of breath: Secondary | ICD-10-CM | POA: Insufficient documentation

## 2023-06-12 DIAGNOSIS — N200 Calculus of kidney: Secondary | ICD-10-CM | POA: Diagnosis not present

## 2023-06-12 LAB — CBC WITH DIFFERENTIAL/PLATELET
Abs Immature Granulocytes: 0.04 10*3/uL (ref 0.00–0.07)
Basophils Absolute: 0 10*3/uL (ref 0.0–0.1)
Basophils Relative: 0 %
Eosinophils Absolute: 0.1 10*3/uL (ref 0.0–0.5)
Eosinophils Relative: 1 %
HCT: 41.4 % (ref 36.0–46.0)
Hemoglobin: 14 g/dL (ref 12.0–15.0)
Immature Granulocytes: 0 %
Lymphocytes Relative: 15 %
Lymphs Abs: 1.7 10*3/uL (ref 0.7–4.0)
MCH: 29.9 pg (ref 26.0–34.0)
MCHC: 33.8 g/dL (ref 30.0–36.0)
MCV: 88.3 fL (ref 80.0–100.0)
Monocytes Absolute: 1.1 10*3/uL — ABNORMAL HIGH (ref 0.1–1.0)
Monocytes Relative: 10 %
Neutro Abs: 8 10*3/uL — ABNORMAL HIGH (ref 1.7–7.7)
Neutrophils Relative %: 74 %
Platelets: 292 10*3/uL (ref 150–400)
RBC: 4.69 MIL/uL (ref 3.87–5.11)
RDW: 13.2 % (ref 11.5–15.5)
WBC: 11 10*3/uL — ABNORMAL HIGH (ref 4.0–10.5)
nRBC: 0 % (ref 0.0–0.2)

## 2023-06-12 LAB — COMPREHENSIVE METABOLIC PANEL WITH GFR
ALT: 16 U/L (ref 0–44)
AST: 16 U/L (ref 15–41)
Albumin: 4.1 g/dL (ref 3.5–5.0)
Alkaline Phosphatase: 71 U/L (ref 38–126)
Anion gap: 10 (ref 5–15)
BUN: 16 mg/dL (ref 8–23)
CO2: 28 mmol/L (ref 22–32)
Calcium: 9.3 mg/dL (ref 8.9–10.3)
Chloride: 98 mmol/L (ref 98–111)
Creatinine, Ser: 1.11 mg/dL — ABNORMAL HIGH (ref 0.44–1.00)
GFR, Estimated: 53 mL/min — ABNORMAL LOW (ref >60)
Glucose, Bld: 108 mg/dL — ABNORMAL HIGH (ref 70–99)
Potassium: 3.1 mmol/L — ABNORMAL LOW (ref 3.5–5.1)
Sodium: 136 mmol/L (ref 135–145)
Total Bilirubin: 0.7 mg/dL (ref 0.0–1.2)
Total Protein: 6.9 g/dL (ref 6.5–8.1)

## 2023-06-12 LAB — URINALYSIS, ROUTINE W REFLEX MICROSCOPIC
Bacteria, UA: NONE SEEN
Bilirubin Urine: NEGATIVE
Cellular Cast, UA: 1
Glucose, UA: NEGATIVE mg/dL
Hgb urine dipstick: NEGATIVE
Ketones, ur: NEGATIVE mg/dL
Leukocytes,Ua: NEGATIVE
Nitrite: NEGATIVE
Protein, ur: 100 mg/dL — AB
Specific Gravity, Urine: 1.024 (ref 1.005–1.030)
pH: 6 (ref 5.0–8.0)

## 2023-06-12 LAB — LIPASE, BLOOD: Lipase: 10 U/L — ABNORMAL LOW (ref 11–51)

## 2023-06-12 MED ORDER — ALBUTEROL SULFATE (2.5 MG/3ML) 0.083% IN NEBU
2.5000 mg | INHALATION_SOLUTION | Freq: Once | RESPIRATORY_TRACT | Status: AC
  Administered 2023-06-12: 2.5 mg via RESPIRATORY_TRACT
  Filled 2023-06-12: qty 3

## 2023-06-12 MED ORDER — HYDROMORPHONE HCL 1 MG/ML IJ SOLN
0.5000 mg | Freq: Once | INTRAMUSCULAR | Status: AC
  Administered 2023-06-12: 0.5 mg via INTRAVENOUS
  Filled 2023-06-12: qty 1

## 2023-06-12 MED ORDER — DICYCLOMINE HCL 20 MG PO TABS: 20.0000 mg | ORAL_TABLET | Freq: Two times a day (BID) | ORAL | 0 refills | Status: AC

## 2023-06-12 MED ORDER — METOCLOPRAMIDE HCL 5 MG/ML IJ SOLN
10.0000 mg | Freq: Once | INTRAMUSCULAR | Status: AC
  Administered 2023-06-12: 10 mg via INTRAVENOUS
  Filled 2023-06-12: qty 2

## 2023-06-12 MED ORDER — SODIUM CHLORIDE 0.9 % IV BOLUS
1000.0000 mL | Freq: Once | INTRAVENOUS | Status: AC
  Administered 2023-06-12: 1000 mL via INTRAVENOUS

## 2023-06-12 MED ORDER — PROMETHAZINE HCL 25 MG PO TABS: 25.0000 mg | ORAL_TABLET | Freq: Four times a day (QID) | ORAL | 0 refills | Status: AC | PRN

## 2023-06-12 MED ORDER — ONDANSETRON HCL 4 MG/2ML IJ SOLN
4.0000 mg | Freq: Once | INTRAMUSCULAR | Status: DC
  Filled 2023-06-12: qty 2

## 2023-06-12 NOTE — Discharge Instructions (Addendum)
 Please read and follow all provided instructions.  Your diagnoses today include:  1. Generalized abdominal pain   2. Nausea     Tests performed today include: Complete blood cell count: White blood cell count was slightly high, normal white blood cells Complete metabolic panel: Potassium was a bit low Lipase (pancreas function test): Urinalysis (urine test): Showed a few infection fighting cells but not overwhelming for infection, culture sent and is pending Vital signs. See below for your results today.   Medications prescribed:  Phenergan (promethazine) - for nausea and vomiting  Bentyl - medication for intestinal cramps and spasms  Take any prescribed medications only as directed.  Home care instructions:  Follow any educational materials contained in this packet.  Follow-up instructions: Please follow-up with your primary care provider in the next 3 days for further evaluation of your symptoms.    Return instructions:  SEEK IMMEDIATE MEDICAL ATTENTION IF: The pain does not go away or becomes severe  A temperature above 101F develops  Repeated vomiting occurs (multiple episodes)  The pain becomes localized to portions of the abdomen. The right side could possibly be appendicitis. In an adult, the left lower portion of the abdomen could be colitis or diverticulitis.  Blood is being passed in stools or vomit (bright red or black tarry stools)  You develop chest pain, difficulty breathing, dizziness or fainting, or become confused, poorly responsive, or inconsolable (young children) If you have any other emergent concerns regarding your health  Additional Information: Abdominal (belly) pain can be caused by many things. Your caregiver performed an examination and possibly ordered blood/urine tests and imaging (CT scan, x-rays, ultrasound). Many cases can be observed and treated at home after initial evaluation in the emergency department. Even though you are being discharged  home, abdominal pain can be unpredictable. Therefore, you need a repeated exam if your pain does not resolve, returns, or worsens. Most patients with abdominal pain don't have to be admitted to the hospital or have surgery, but serious problems like appendicitis and gallbladder attacks can start out as nonspecific pain. Many abdominal conditions cannot be diagnosed in one visit, so follow-up evaluations are very important.  Your vital signs today were: BP (!) 180/78   Pulse 93   Temp 98.4 F (36.9 C)   Resp 16   SpO2 95%  If your blood pressure (bp) was elevated above 135/85 this visit, please have this repeated by your doctor within one month. --------------

## 2023-06-12 NOTE — ED Notes (Signed)
 RT Note:  Patient has HX Asthma and long COVID but has not had her breathing treatment today. Patient is requesting one due to SOB

## 2023-06-12 NOTE — ED Notes (Signed)
 Patient transported to CT

## 2023-06-12 NOTE — ED Provider Notes (Signed)
 [place] EMERGENCY DEPARTMENT AT [facility] Provider Note   CSN: [id number] Arrival date & time: 06/12/23  1145     History  Chief Complaint  Patient presents with   Abdominal Pain    Becky [name] is a 73 y.o. female.  Patient presents to the emergency department today for evaluation of abdominal pain over the past 3 days with associated constipation and nausea.  No vomiting.  Pain is generalized across the abdomen.  It has gradually worsened.  She reports history of cholecystectomy and appendectomy.  She went to [facility] walk-in clinic today, reported elevated white blood cell count of 12,000's, sent to the emergency department for evaluation.  Patient reports some shortness of breath that is chronic due to asthma, has not taken a breathing treatment today.  Denies known history of diverticulitis or colitis.      Home Medications Prior to Admission medications   Medication Sig Start Date End Date Taking? Authorizing Provider  acyclovir (ZOVIRAX) 200 MG capsule as needed. 09/05/14   [provider]  albuterol (VENTOLIN HFA) 108 (90 Base) MCG/ACT inhaler SMARTSIG:1 Puff(s) Via Inhaler Every 4 Hours PRN 05/14/23   [provider]  ALPRAZolam (XANAX) 0.25 MG tablet Take 0.25 mg by mouth 3 (three) times daily as needed for anxiety or sleep. 08/10/15   [provider]  ARIPiprazole (ABILIFY) 5 MG tablet Take 5 mg by mouth at bedtime. 05/14/23   [provider]  aspirin EC 325 MG tablet Take 650 mg by mouth at bedtime.    [provider]  desvenlafaxine (PRISTIQ) 100 MG 24 hr tablet Take 100 mg by mouth daily. 09/13/19   [provider]  Dexmethylphenidate HCl 40 MG CP24 Take 1 capsule by mouth every morning. 05/20/23   [provider]  hydrochlorothiazide (HYDRODIURIL) 25 MG tablet Take 25 mg by mouth every morning. 08/01/21   [provider]  levothyroxine (SYNTHROID) 50 MCG tablet Take 50 mcg by mouth daily  before breakfast.    [provider]  nadolol (CORGARD) 20 MG tablet Take 1 tablet (20 mg total) by mouth daily. Patient needs appt for # 90 supply or future refills. Please call office to schedule appt. 07/16/22   Camnitz, Babetta Lesch, MD  NONFORMULARY OR COMPOUNDED ITEM Place 1-4 drops into the right eye See admin instructions. Prednisolone 1% / Moxifloxacin 0.5% / Bromfenac 0.075% Opth Soln.  Instill 1 drop into the right eye four times daily for 4 days, then 1 drop twice a day for 2 days, then 1 drop once a day until finished.    [provider]  OVER THE COUNTER MEDICATION Take 2 tablets by mouth daily as needed. Equate indigestion relief.    [provider]  pramipexole (MIRAPEX) 0.25 MG tablet Take 0.5 mg by mouth at bedtime. 12/26/15   [provider]  pravastatin (PRAVACHOL) 80 MG tablet Take 80 mg by mouth daily. 08/01/21   [provider]  predniSONE (DELTASONE) 20 MG tablet Take 1 tablet (20 mg total) by mouth daily with breakfast. 06/07/23   Myer Artis A, MD  Tiotropium Bromide Monohydrate (SPIRIVA RESPIMAT) 1.25 MCG/ACT AERS Inhale 1 puff into the lungs daily. 05/19/23   Quillian Brunt, MD  traZODone (DESYREL) 50 MG tablet Take 50-150 mg by mouth at bedtime as needed. 03/20/23   [provider]      Allergies    Penicillins, Telmisartan, Iodinated contrast media, Iohexol, Latex, and Nsaids    Review of Systems  Review of Systems  Physical Exam Updated Vital Signs BP (!) 136/102 (BP Location: Right Arm)   Pulse (!) 104   Temp 98.5 F (36.9 C) (Oral)   Resp (!) 22   SpO2 95%    Physical Exam Vitals and nursing note reviewed.  Constitutional:      General: She is not in acute distress.    Appearance: She is well-developed.  HENT:     Head: Normocephalic and atraumatic.     Right Ear: External ear normal.     Left Ear: External ear normal.     Nose: Nose normal.  Eyes:     Conjunctiva/sclera: Conjunctivae  normal.     Comments: Patient with right eye subconjunctival hemorrhage --reports due to recent injections  Cardiovascular:     Rate and Rhythm: Normal rate and regular rhythm.     Heart sounds: No murmur heard. Pulmonary:     Effort: No respiratory distress.     Breath sounds: No wheezing, rhonchi or rales.  Abdominal:     Palpations: Abdomen is soft.     Tenderness: There is generalized abdominal tenderness. There is no guarding or rebound. Negative signs include Murphy's sign and McBurney's sign.  Musculoskeletal:     Cervical back: Normal range of motion and neck supple.     Right lower leg: No edema.     Left lower leg: No edema.  Skin:    General: Skin is warm and dry.     Findings: No rash.  Neurological:     General: No focal deficit present.     Mental Status: She is alert. Mental status is at baseline.     Motor: No weakness.  Psychiatric:        Mood and Affect: Mood normal.    ED Results / Procedures / Treatments   Labs (all labs ordered are listed, but only abnormal results are displayed) Labs Reviewed  CBC WITH DIFFERENTIAL/PLATELET - Abnormal; Notable for the following components:      Result Value   WBC 11.0 (*)    Neutro Abs 8.0 (*)    Monocytes Absolute 1.1 (*)    All other components within normal limits  COMPREHENSIVE METABOLIC PANEL WITH GFR - Abnormal; Notable for the following components:   Potassium 3.1 (*)    Glucose, Bld 108 (*)    Creatinine, Ser 1.11 (*)    GFR, Estimated 53 (*)    All other components within normal limits  LIPASE, BLOOD - Abnormal; Notable for the following components:   Lipase 10 (*)    All other components within normal limits  URINALYSIS, ROUTINE W REFLEX MICROSCOPIC - Abnormal; Notable for the following components:   Protein, ur 100 (*)    All other components within normal limits  URINE CULTURE    EKG None  Radiology CT ABDOMEN PELVIS WO CONTRAST Result Date: 06/12/2023 CLINICAL DATA:  generalized abdominal  pain x 3 days, IV dye allergy. EXAM: CT ABDOMEN AND PELVIS WITHOUT CONTRAST TECHNIQUE: Multidetector CT imaging of the abdomen and pelvis was performed following the standard protocol without IV contrast. RADIATION DOSE REDUCTION: This exam was performed according to the departmental dose-optimization program which includes automated exposure control, adjustment of the mA and/or kV according to patient size and/or use of iterative reconstruction technique. COMPARISON:  CT scan abdomen and pelvis from 11/10/2018. FINDINGS: Lower chest: There are patchy atelectatic changes in the visualized lung bases. No overt consolidation. No pleural effusion. The heart is normal in size. No  pericardial effusion. Hepatobiliary: The liver is normal in size. Non-cirrhotic configuration. No suspicious mass. No intrahepatic or extrahepatic bile duct dilation. Gallbladder is surgically absent. Pancreas: Unremarkable. No pancreatic ductal dilatation or surrounding inflammatory changes. Spleen: Within normal limits. No focal lesion. Adrenals/Urinary Tract: There are bilateral subcentimeter sized hypoattenuating nodules in bilateral adrenal glands, favored to represent multiple adrenal adenomas. No suspicious renal mass. No hydronephrosis. There are 2 to 3, 1 mm nonobstructing calculi in the right kidney. No other renal or ureteric calculi. Urinary bladder is under distended, precluding optimal assessment. However, no large mass or stones identified. No perivesical fat stranding. Stomach/Bowel: There is a small sliding hiatal hernia. No disproportionate dilation of the small or large bowel loops. No evidence of abnormal bowel wall thickening or inflammatory changes. The appendix was not visualized; however there is no acute inflammatory process in the right lower quadrant. There are multiple diverticula mainly in the sigmoid colon, without imaging signs of diverticulitis. Vascular/Lymphatic: No ascites or pneumoperitoneum. No abdominal or  pelvic lymphadenopathy, by size criteria. No aneurysmal dilation of the major abdominal arteries. There are mild peripheral atherosclerotic vascular calcifications of the aorta and its major branches. Reproductive: The uterus is surgically absent. No large adnexal mass. Other: There is a tiny fat containing umbilical hernia. The soft tissues and abdominal wall are otherwise unremarkable. Musculoskeletal: No suspicious osseous lesions. There are mild multilevel degenerative changes in the visualized spine. There is age indeterminate mild compression deformity of inferior endplate of L1 vertebral body, new since the prior study. No significant retropulsion or spinal canal compromise. There is no perivertebral soft tissue stranding. Findings are favored subacute/chronic in etiology. Correlate clinically. IMPRESSION: 1. No acute inflammatory process identified within the abdomen or pelvis. 2. Multiple other nonacute observations, as described above. Aortic Atherosclerosis (ICD10-I70.0). Electronically Signed   By: Beula Brunswick M.D.   On: 06/12/2023 14:33    Procedures Procedures    Medications Ordered in ED Medications  albuterol (PROVENTIL) (2.5 MG/3ML) 0.083% nebulizer solution 2.5 mg (2.5 mg Nebulization Given 06/12/23 1257)  HYDROmorphone (DILAUDID) injection 0.5 mg (0.5 mg Intravenous Given 06/12/23 1338)  metoCLOPramide (REGLAN) injection 10 mg (10 mg Intravenous Given 06/12/23 1523)    ED Course/ Medical Decision Making/ A&P Clinical Course as of 06/12/23 1859  Fri Jun 12, 2023  1823 GFR, Estimated(!): 53 [RT]    Clinical Course User Index [RT] [place], Janell Media, Student-PA    Patient seen and examined. History obtained directly from patient.   Labs/EKG: Ordered CBC, CMP, lipase, UA.  Imaging: Ordered CT abdomen pelvis, without contrast as patient has allergy.  Medications/Fluids: Ordered: Dilaudid, Zofran.   Most recent vital signs reviewed and are as follows: BP (!) 136/102 (BP  Location: Right Arm)   Pulse (!) 104   Temp 98.5 F (36.9 C) (Oral)   Resp (!) 22   SpO2 95%   Initial impression: Generalized abdominal pain  3:48 PM Reassessment performed. Patient appears drowsy after pain medication and additional dose of antiemetic.  Labs personally reviewed and interpreted including: CBC with mildly elevated white blood cell count at 11,000 with neutrophil count 8; CMP potassium of 3.1 and creatinine minimally elevated at 1.11 otherwise unremarkable; lipase low at 10.  UA pending.  Imaging personally visualized and interpreted including: CT abdomen pelvis, agree no acute emergent findings.  Reviewed pertinent lab work and imaging with patient at bedside. Questions answered.   Most current vital signs reviewed and are as follows: BP (!) 146/92   Pulse 96  Temp 98.5 F (36.9 C) (Oral)   Resp 14   SpO2 97%   Plan: Will need UA, p.o. challenge, reassessment.  Will let her metabolize some of the medications.  6:59 PM Reassessment performed. Patient appears stable during several hours in the emergency department.  No rebound or guarding, or tenderness on reexam.  Patient does report continued nausea.  She has had some fluids to drink without vomiting.  Urine finally collected after IV fluid bolus, is concentrated, but no definite signs of infection.  Culture sent.  Labs personally reviewed and interpreted including: UA with some white blood cells but no bacteria, negative nitrite, negative leukocyte esterase.   Reviewed pertinent lab work and imaging with patient at bedside. Questions answered.   Most current vital signs reviewed and are as follows: BP (!) 180/78   Pulse 93   Temp 98.4 F (36.9 C)   Resp 16   SpO2 95%   Plan: Discharge to home.   Prescriptions written for: Phenergan  Other home care instructions discussed: Maintain good hydration, bland diet.  ED return instructions discussed: The patient was urged to return to the Emergency Department  immediately with worsening of current symptoms, worsening abdominal pain, persistent vomiting, blood noted in stools, fever, or any other concerns. The patient verbalized understanding.   Follow-up instructions discussed: Patient encouraged to follow-up with their PCP in 2-3 days.                                 Medical Decision Making Amount and/or Complexity of Data Reviewed Labs: ordered. Radiology: ordered.  Risk Prescription drug management.   For this patient's complaint of abdominal pain, the following conditions were considered on the differential diagnosis: gastritis/PUD, enteritis/duodenitis, appendicitis, cholelithiasis/cholecystitis, cholangitis, pancreatitis, ruptured viscus, colitis, diverticulitis, small/large bowel obstruction, proctitis, cystitis, pyelonephritis, ureteral colic, aortic dissection, aortic aneurysm. In women, pelvic inflammatory disease, ovarian cysts, and tubo-ovarian abscess were also considered. Atypical chest etiologies were also considered including ACS, PE, and pneumonia.  Unclear etiology of the patient's symptoms at this point.  She is having a lot of nausea without vomiting.  Controlled with IV medications.  Suspect mild dehydration, treated with IV fluids as well.  She will continue to treat supportively over the weekend, return with worsening.  She is encouraged to follow-up with PCP next week for recheck.  The patient's vital signs, pertinent lab work and imaging were reviewed and interpreted as discussed in the ED course. Hospitalization was considered for further testing, treatments, or serial exams/observation. However as patient is well-appearing, has a stable exam, and reassuring studies today, I do not feel that they warrant admission at this time. This plan was discussed with the patient who verbalizes agreement and comfort with this plan and seems reliable and able to return to the Emergency Department with worsening or changing symptoms.            Final Clinical Impression(s) / ED Diagnoses Final diagnoses:  Generalized abdominal pain  Nausea    Rx / DC Orders ED Discharge Orders          Ordered    promethazine (PHENERGAN) 25 MG tablet  Every 6 hours PRN        06/12/23 1851    dicyclomine (BENTYL) 20 MG tablet  2 times daily        06/12/23 1851              Roylee Chaffin, PA-C  06/12/23 1902    Hershel Los, MD 06/14/23 2248

## 2023-06-12 NOTE — ED Triage Notes (Signed)
 Abdo pain x 3 day Seen at [facility], called today and told to get checked out due to abnormal labs

## 2023-06-12 NOTE — ED Notes (Addendum)
 Pt is on periwick, Pt has not urinated yet.

## 2023-06-14 LAB — URINE CULTURE: Culture: 30000 — AB

## 2023-06-15 ENCOUNTER — Telehealth (HOSPITAL_BASED_OUTPATIENT_CLINIC_OR_DEPARTMENT_OTHER): Payer: Self-pay

## 2023-06-15 ENCOUNTER — Telehealth (HOSPITAL_COMMUNITY): Payer: Self-pay | Admitting: *Deleted

## 2023-06-15 DIAGNOSIS — R1084 Generalized abdominal pain: Secondary | ICD-10-CM | POA: Diagnosis not present

## 2023-06-15 DIAGNOSIS — K5900 Constipation, unspecified: Secondary | ICD-10-CM | POA: Diagnosis not present

## 2023-06-15 NOTE — Telephone Encounter (Signed)
 Post ED Visit - Positive Culture Follow-up  Culture report reviewed by antimicrobial stewardship pharmacist: [facility] [x]  Argentina Bees, Pharm.D. []  Skeet Duke, Pharm.D., BCPS AQ-ID []  Leslee Rase, Pharm.D., BCPS []  Garland Junk, Pharm.D., BCPS []  Kappa, [address].D., BCPS, AAHIVP []  Alcide Aly, Pharm.D., BCPS, AAHIVP []  Jerri Morale, PharmD, BCPS []  Graham Laws, PharmD, BCPS []  Cleda Curly, PharmD, BCPS []  Tamar Fairly, PharmD []  Ballard Levels, PharmD, BCPS []  Ollen Beverage, PharmD  [facility] []  Arlyne Bering, PharmD []  Sherryle Don, PharmD []  Van Gelinas, PharmD []  Delila Felty, Rph []  [place]una [place]) Cleora Daft, PharmD []  Augustina Block, PharmD []  Arie Kurtz, PharmD []  Sharlyn Deaner, PharmD []  Agnes Hose, PharmD []  Kendall Pauls, PharmD []  Gladstone Lamer, PharmD []  Armanda Bern, PharmD []  Tera Fellows, PharmD   Positive urine culture  Not treated, abd pain, no urinary s/s, 30K colonies. OK, no need to treat and no further patient follow-up is required at this time.  Delena Feil 06/15/2023, 9:34 AM

## 2023-06-15 NOTE — Telephone Encounter (Signed)
 Left message on voicemail per DPR in reference to upcoming appointment scheduled on 06/17/23  with detailed instructions given per Myocardial Perfusion Study Information Sheet for the test. LM to arrive 15 minutes early, and that it is imperative to arrive on time for appointment to keep from having the test rescheduled. If you need to cancel or reschedule your appointment, please call the office within 24 hours of your appointment. Failure to do so may result in a cancellation of your appointment, and a $50 no show fee. Phone number given for call back for any questions. Becky [name]

## 2023-06-16 ENCOUNTER — Ambulatory Visit (HOSPITAL_BASED_OUTPATIENT_CLINIC_OR_DEPARTMENT_OTHER): Admitting: Physical Therapy

## 2023-06-17 ENCOUNTER — Encounter: Payer: Self-pay | Admitting: Pulmonary Disease

## 2023-06-17 ENCOUNTER — Other Ambulatory Visit: Payer: Self-pay | Admitting: Pulmonary Disease

## 2023-06-17 ENCOUNTER — Ambulatory Visit: Payer: Self-pay | Admitting: Pulmonary Disease

## 2023-06-17 ENCOUNTER — Telehealth: Payer: Self-pay | Admitting: *Deleted

## 2023-06-17 ENCOUNTER — Ambulatory Visit (HOSPITAL_COMMUNITY)

## 2023-06-17 MED ORDER — PREDNISONE 20 MG PO TABS
20.0000 mg | ORAL_TABLET | Freq: Every day | ORAL | 0 refills | Status: DC
Start: 2023-06-17 — End: 2023-06-28

## 2023-06-17 MED ORDER — AZITHROMYCIN 250 MG PO TABS: ORAL_TABLET | ORAL | 0 refills | Status: DC

## 2023-06-17 NOTE — Progress Notes (Signed)
 Prescription for Zithromax and prednisone called in

## 2023-06-17 NOTE — Telephone Encounter (Signed)
 She needs to be seen if she still having symptoms even on the prednisone.  Please schedule her for an acute office visit

## 2023-06-17 NOTE — Telephone Encounter (Signed)
 E2C2 Pulmonary Triage - Initial Assessment Questions "Chief Complaint (e.g., cough, sob, wheezing, fever, chills, sweat or additional symptoms) *Go to specific symptom protocol after initial questions. Patient calling with continued complaints of shortness of breath with coughing. Patient states symptoms have been present since 06/07/2023 when she started a seven day prescription of prednisone. Patient is requesting for additional prednisone stating that the seven day run wasn't enough. Patient denies fever, chills and body aches. Has been using her inhalers. Reports nebulizer hasn't been helping. While speaking to patient, orders were placed on pulmonary MD. Discussed with patient the necessity to follow up with pulmonary to be seen in the office.  "How long have symptoms been present?" Symptoms have been going on since 06/07/2023  Have you tested for COVID or Flu? Note: If not, ask patient if a home test can be taken. If so, instruct patient to call back for positive results. No  MEDICINES:   "Have you used any OTC meds to help with symptoms?" Yes If yes, ask "What medications?" Vapor stick  "Have you used your inhalers/maintenance medication?" Yes If yes, "What medications?" Albuterol inhaler and nebulizer Advair Spriva  If inhaler, ask "How many puffs and how often?" Note: Review instructions on medication in the chart. Advair 2 puffs BID Spriva 1 puff DAILY  OXYGEN: "Do you wear supplemental oxygen?" No If yes, "How many liters are you supposed to use?"   "Do you monitor your oxygen levels?" Yes If yes, "What is your reading (oxygen level) today?" 95%  "What is your usual oxygen saturation reading?"  (Note: Pulmonary O2 sats should be 90% or greater) 95%   Copied from CRM [id number]. Topic: Clinical - Medication Question >> Jun 17, 2023  3:50 PM Whitney O wrote: Reason for CRM: patient is calling wanting to speak with a nurse about some medication Prescription has run out .  Thinking needing some prednisone . Having trouble breathing. Tried calling patient back no response  [phone] please give patient a call concerning her symptoms and the request of the medication Reason for Disposition  [1] MODERATE longstanding difficulty breathing (e.g., speaks in phrases, SOB even at rest, pulse 100-120) AND [2] SAME as normal  Answer Assessment - Initial Assessment Questions 1. RESPIRATORY STATUS: "Describe your breathing?" (e.g., wheezing, shortness of breath, unable to speak, severe coughing)      Shortness of breath 2. ONSET: "When did this breathing problem begin?"      Symptoms started back on 06/07/2023 3. PATTERN "Does the difficult breathing come and go, or has it been constant since it started?"      Constant and has been progressing 4. SEVERITY: "How bad is your breathing?" (e.g., mild, moderate, severe)    - MILD: No SOB at rest, mild SOB with walking, speaks normally in sentences, can lie down, no retractions, pulse < 100.    - MODERATE: SOB at rest, SOB with minimal exertion and prefers to sit, cannot lie down flat, speaks in phrases, mild retractions, audible wheezing, pulse 100-120.    - SEVERE: Very SOB at rest, speaks in single words, struggling to breathe, sitting hunched forward, retractions, pulse > 120      Moderate + 5. RECURRENT SYMPTOM: "Have you had difficulty breathing before?" If Yes, ask: "When was the last time?" and "What happened that time?"      yes 6. CARDIAC HISTORY: "Do you have any history of heart disease?" (e.g., heart attack, angina, bypass surgery, angioplasty)      no 7. LUNG  HISTORY: "Do you have any history of lung disease?"  (e.g., pulmonary embolus, asthma, emphysema)     Post COVID lung disease 8. CAUSE: "What do you think is causing the breathing problem?"      unsure  Protocols used: Breathing Difficulty-A-AH

## 2023-06-17 NOTE — Telephone Encounter (Signed)
 Please advise as DOD and due to Dr Gaynell Keeler not here this week.

## 2023-06-17 NOTE — Telephone Encounter (Signed)
 Copied from CRM [id number]. Topic: Clinical - Medication Question >> Jun 17, 2023  4:03 PM Crist Dominion wrote: Reason for CRM: Patient is requesting a phone call back from a nurse regarding a prednisone refill, as she states her prescription is not enough to last her to her next re fill.  Patient is no better, getting worse.  No fever, chills or body aches.  Breathing is worse, she is more sob.  She is using the Advair and Spiriva.  No chest congestion, just sob.  She has been using the albuterol inhaler and nebulizer without much relief. She is asking for further prednisone, she states the 7 days was not enough.  Dr. Gaynell Keeler, please advise.  Thank you.

## 2023-06-17 NOTE — Telephone Encounter (Signed)
 Copied from CRM [id number]. Topic: Clinical - Medication Refill >> Jun 17, 2023  3:59 PM Crist Dominion wrote: Most Recent Primary Care Visit:  Provider: Jerrlyn Morel  Department: [facility]  Visit Type: TELEPHONE OFFICE VISIT  Date: 03/19/2021  Medication: predniSONE (DELTASONE) 20 MG tablet  Has the patient contacted their pharmacy? No, out of refills (Agent: If no, request that the patient contact the pharmacy for the refill. If patient does not wish to contact the pharmacy document the reason why and proceed with request.) (Agent: If yes, when and what did the pharmacy advise?)  Is this the correct pharmacy for this prescription? Yes If no, delete pharmacy and type the correct one.  This is the patient's preferred pharmacy:  [facility] [place], [place] - [address] [place] [address] Phone: [phone] Fax: [phone]   Has the prescription been filled recently? Yes  Is the patient out of the medication? Yes, patient is requesting more than was previously prescribed because it was not enough.   Has the patient been seen for an appointment in the last year OR does the patient have an upcoming appointment?   Can we respond through MyChart? No  Agent: Please be advised that Rx refills may take up to 3 business days. We ask that you follow-up with your pharmacy.

## 2023-06-18 ENCOUNTER — Encounter (HOSPITAL_BASED_OUTPATIENT_CLINIC_OR_DEPARTMENT_OTHER): Payer: Self-pay | Admitting: Physical Therapy

## 2023-06-18 ENCOUNTER — Ambulatory Visit (HOSPITAL_BASED_OUTPATIENT_CLINIC_OR_DEPARTMENT_OTHER): Admitting: Physical Therapy

## 2023-06-18 ENCOUNTER — Telehealth (HOSPITAL_BASED_OUTPATIENT_CLINIC_OR_DEPARTMENT_OTHER): Payer: Self-pay | Admitting: Physical Therapy

## 2023-06-18 NOTE — Therapy (Signed)
 [facility] at [facility] [address] [place], [place], [address] Phone: [phone]   Fax:  [phone]  Patient Details  Name: Becky [name] MRN: [id number] Date of Birth: 04-27-1950 Referring Provider:  No ref. provider found  Encounter Date: 06/18/2023  PHYSICAL THERAPY DISCHARGE SUMMARY  Visits from Start of Care: 5  Current functional level related to goals / functional outcomes: Unknown as patient has not returned. Patient being discharged due to attendance policy. She has been not feeling well  and she has had several no show and cancelled appointments.    Remaining deficits: unknown   Education / Equipment: HEP   Patient agrees to discharge. Patient goals were not met. Patient is being discharged due to not returning since the last visit.   2:29 PM, 06/18/23 Beather Liming PT, DPT Physical Therapist at [facility] at [facility] [address] [place], [place], [address] Phone: [phone]   Fax:  [phone]

## 2023-06-18 NOTE — Telephone Encounter (Signed)
 Pt called back and was aware the dr had called in medication for her.  She has started the medication, and knows she will need to make an appt if this does not help or she does not get better. Pt verbalized understanding

## 2023-06-18 NOTE — Telephone Encounter (Signed)
 Patient no show, spoke with patient regarding missed appointment. Patient has been sick and missed several appointments. Reminded of attendance policy and informed her that she will be discharged due to number of no show and cancelled appointments. Instructed on getting a new referral if she would like to return to PT.   2:26 PM, 06/18/23 Beather Liming PT, DPT Physical Therapist at [facility]

## 2023-06-18 NOTE — Telephone Encounter (Signed)
ATC x1 LVM for patient to call our office back. 

## 2023-06-22 ENCOUNTER — Emergency Department (HOSPITAL_COMMUNITY)

## 2023-06-22 ENCOUNTER — Other Ambulatory Visit: Payer: Self-pay

## 2023-06-22 ENCOUNTER — Emergency Department (HOSPITAL_COMMUNITY)
Admission: EM | Admit: 2023-06-22 | Discharge: 2023-06-23 | Disposition: A | Discharge status: Home / Self Care | Attending: Emergency Medicine | Admitting: Emergency Medicine

## 2023-06-22 ENCOUNTER — Encounter (HOSPITAL_COMMUNITY): Payer: Self-pay | Admitting: Emergency Medicine

## 2023-06-22 ENCOUNTER — Telehealth (HOSPITAL_COMMUNITY): Payer: Self-pay | Admitting: Cardiology

## 2023-06-22 DIAGNOSIS — Z91040 Latex allergy status: Secondary | ICD-10-CM | POA: Insufficient documentation

## 2023-06-22 DIAGNOSIS — S3992XA Unspecified injury of lower back, initial encounter: Secondary | ICD-10-CM | POA: Diagnosis not present

## 2023-06-22 DIAGNOSIS — W06XXXA Fall from bed, initial encounter: Secondary | ICD-10-CM | POA: Diagnosis not present

## 2023-06-22 DIAGNOSIS — N189 Chronic kidney disease, unspecified: Secondary | ICD-10-CM | POA: Diagnosis not present

## 2023-06-22 DIAGNOSIS — Z7982 Long term (current) use of aspirin: Secondary | ICD-10-CM | POA: Insufficient documentation

## 2023-06-22 DIAGNOSIS — I10 Essential (primary) hypertension: Secondary | ICD-10-CM | POA: Diagnosis not present

## 2023-06-22 DIAGNOSIS — M549 Dorsalgia, unspecified: Secondary | ICD-10-CM | POA: Diagnosis not present

## 2023-06-22 DIAGNOSIS — R32 Unspecified urinary incontinence: Secondary | ICD-10-CM | POA: Insufficient documentation

## 2023-06-22 DIAGNOSIS — S299XXA Unspecified injury of thorax, initial encounter: Secondary | ICD-10-CM | POA: Diagnosis not present

## 2023-06-22 DIAGNOSIS — M4316 Spondylolisthesis, lumbar region: Secondary | ICD-10-CM | POA: Diagnosis not present

## 2023-06-22 DIAGNOSIS — S39012A Strain of muscle, fascia and tendon of lower back, initial encounter: Secondary | ICD-10-CM | POA: Insufficient documentation

## 2023-06-22 DIAGNOSIS — R0902 Hypoxemia: Secondary | ICD-10-CM | POA: Diagnosis not present

## 2023-06-22 LAB — CBC WITH DIFFERENTIAL/PLATELET
Abs Immature Granulocytes: 0.06 10*3/uL (ref 0.00–0.07)
Basophils Absolute: 0 10*3/uL (ref 0.0–0.1)
Basophils Relative: 0 %
Eosinophils Absolute: 0.2 10*3/uL (ref 0.0–0.5)
Eosinophils Relative: 1 %
HCT: 34.3 % — ABNORMAL LOW (ref 36.0–46.0)
Hemoglobin: 11 g/dL — ABNORMAL LOW (ref 12.0–15.0)
Immature Granulocytes: 1 %
Lymphocytes Relative: 14 %
Lymphs Abs: 1.6 10*3/uL (ref 0.7–4.0)
MCH: 29.7 pg (ref 26.0–34.0)
MCHC: 32.1 g/dL (ref 30.0–36.0)
MCV: 92.7 fL (ref 80.0–100.0)
Monocytes Absolute: 1.2 10*3/uL — ABNORMAL HIGH (ref 0.1–1.0)
Monocytes Relative: 10 %
Neutro Abs: 8.7 10*3/uL — ABNORMAL HIGH (ref 1.7–7.7)
Neutrophils Relative %: 74 %
Platelets: 305 10*3/uL (ref 150–400)
RBC: 3.7 MIL/uL — ABNORMAL LOW (ref 3.87–5.11)
RDW: 13 % (ref 11.5–15.5)
WBC: 11.9 10*3/uL — ABNORMAL HIGH (ref 4.0–10.5)
nRBC: 0 % (ref 0.0–0.2)

## 2023-06-22 LAB — COMPREHENSIVE METABOLIC PANEL WITH GFR
ALT: 20 U/L (ref 0–44)
AST: 14 U/L — ABNORMAL LOW (ref 15–41)
Albumin: 3.4 g/dL — ABNORMAL LOW (ref 3.5–5.0)
Alkaline Phosphatase: 60 U/L (ref 38–126)
Anion gap: 10 (ref 5–15)
BUN: 18 mg/dL (ref 8–23)
CO2: 30 mmol/L (ref 22–32)
Calcium: 8.9 mg/dL (ref 8.9–10.3)
Chloride: 99 mmol/L (ref 98–111)
Creatinine, Ser: 1.08 mg/dL — ABNORMAL HIGH (ref 0.44–1.00)
GFR, Estimated: 55 mL/min — ABNORMAL LOW (ref >60)
Glucose, Bld: 104 mg/dL — ABNORMAL HIGH (ref 70–99)
Potassium: 3 mmol/L — ABNORMAL LOW (ref 3.5–5.1)
Sodium: 139 mmol/L (ref 135–145)
Total Bilirubin: 0.6 mg/dL (ref 0.0–1.2)
Total Protein: 6.5 g/dL (ref 6.5–8.1)

## 2023-06-22 LAB — LIPASE, BLOOD: Lipase: 22 U/L (ref 11–51)

## 2023-06-22 NOTE — ED Provider Triage Note (Signed)
 Emergency Medicine Provider Triage Evaluation Note  Becky [name] , a 73 y.o. female  was evaluated in triage.  Pt complains of low back pain after sliding off bed last night. Reports "feeling like something exploded" in her back, getting worse today. Reports ambulating a little today but is decreased since it increased pain. Reports chronic urinary incontinence.  Endorses intermittent leg numbness bilaterally (currently not numb), generalized abdominal pain x 1 week accompanied with diarrhea.  Denies fevers, chest pain, n/v, melena, hematochezia, dysuria, fecal incontinence, saddle paresthesia.   Review of Systems  Positive: N/a Negative: N/a  Physical Exam  BP (!) 161/100   Pulse 60   Temp 98.1 F (36.7 C) (Oral)   Resp (!) 22   SpO2 95%  Gen:   Awake, no distress   Resp:  Normal effort  MSK:   Moves extremities without difficulty  Other:    Medical Decision Making  Medically screening exam initiated at 7:12 PM.  Appropriate orders placed.  Becky [name] was informed that the remainder of the evaluation will be completed by another provider, this initial triage assessment does not replace that evaluation, and the importance of remaining in the ED until their evaluation is complete.     Hayes Lipps, PA-C 06/22/23 [phone]

## 2023-06-22 NOTE — Telephone Encounter (Signed)
 Patient cancelled echo and Myoview  due to she is sick. I spoke with patient to reschedule this am and she will call us  back to reschedule when she is feeling better, Order will be removed from the echo/nuc WQ.

## 2023-06-22 NOTE — ED Triage Notes (Addendum)
 Pt from home with reports of lower back pain. PT reports this happened while she was sliding across her bed yesterday and "felt something explode." Pt denies numbness or tingling in lower extremities. Pt was given 100mcg fentanyl en route with improvement of pain.

## 2023-06-23 ENCOUNTER — Encounter (HOSPITAL_BASED_OUTPATIENT_CLINIC_OR_DEPARTMENT_OTHER): Admitting: Physical Therapy

## 2023-06-23 LAB — URINALYSIS, ROUTINE W REFLEX MICROSCOPIC
Bacteria, UA: NONE SEEN
Bilirubin Urine: NEGATIVE
Glucose, UA: NEGATIVE mg/dL
Hgb urine dipstick: NEGATIVE
Ketones, ur: NEGATIVE mg/dL
Nitrite: NEGATIVE
Protein, ur: NEGATIVE mg/dL
Specific Gravity, Urine: 1.009 (ref 1.005–1.030)
pH: 7 (ref 5.0–8.0)

## 2023-06-23 MED ORDER — AMLODIPINE BESYLATE 5 MG PO TABS: 10.0000 mg | ORAL_TABLET | Freq: Once | ORAL | Status: DC

## 2023-06-23 MED ORDER — METOCLOPRAMIDE HCL 5 MG/ML IJ SOLN
5.0000 mg | Freq: Once | INTRAMUSCULAR | Status: AC
  Administered 2023-06-23: 5 mg via INTRAMUSCULAR
  Filled 2023-06-23: qty 2

## 2023-06-23 MED ORDER — POTASSIUM CHLORIDE CRYS ER 20 MEQ PO TBCR
60.0000 meq | EXTENDED_RELEASE_TABLET | Freq: Once | ORAL | Status: AC
  Administered 2023-06-23: 60 meq via ORAL
  Filled 2023-06-23: qty 3

## 2023-06-23 MED ORDER — OXYCODONE-ACETAMINOPHEN 5-325 MG PO TABS: 1.0000 | ORAL_TABLET | Freq: Four times a day (QID) | ORAL | 0 refills | Status: DC | PRN

## 2023-06-23 MED ORDER — LIDOCAINE 5 % EX PTCH
1.0000 | MEDICATED_PATCH | CUTANEOUS | Status: DC
  Administered 2023-06-23: 1 via TRANSDERMAL
  Filled 2023-06-23: qty 1

## 2023-06-23 MED ORDER — OXYCODONE-ACETAMINOPHEN 5-325 MG PO TABS
1.0000 | ORAL_TABLET | ORAL | Status: AC | PRN
  Administered 2023-06-23 (×2): 1 via ORAL
  Filled 2023-06-23 (×2): qty 1

## 2023-06-23 MED ORDER — PROMETHAZINE HCL 25 MG PO TABS
25.0000 mg | ORAL_TABLET | Freq: Once | ORAL | Status: AC
  Administered 2023-06-23: 25 mg via ORAL
  Filled 2023-06-23: qty 1

## 2023-06-23 NOTE — ED Provider Notes (Signed)
 [place] [facility] DEPARTMENT AT [facility] Provider Note   CSN: [id number] Arrival date & time: 06/22/23  4098     History  Chief Complaint  Patient presents with   Back Pain    Becky [name] is a 73 y.o. female with a history of stroke, CKD, and thyroid  disease who presents the ED today for back pain.  Patient reports she was moving over in bed and got up on her elbows to slide over when she fell sharp pain in the middle of her back. Pain worse with movement and improved with pain medication given by EMS. Denies any numbness, tingling, or weakness in the lower extremities. No saddle anesthesia. No additional complaints or concerns at this time.    Home Medications Prior to Admission medications   Medication Sig Start Date End Date Taking? Authorizing Provider  azithromycin  (ZITHROMAX  Z-PAK) 250 MG tablet Take 2 tablets day 1 and then 1 daily for 4 days 06/17/23   Margaretann Sharper, MD  oxyCODONE -acetaminophen  (PERCOCET/ROXICET) 5-325 MG tablet Take 1 tablet by mouth every 6 (six) hours as needed for up to 5 days for severe pain (pain score 7-10). 06/23/23 06/28/23 Yes Sonnie Dusky, PA-C  predniSONE  (DELTASONE ) 20 MG tablet Take 1 tablet (20 mg total) by mouth daily with breakfast. 06/17/23   Myer Artis A, MD  acyclovir (ZOVIRAX) 200 MG capsule as needed. 09/05/14   [provider]  albuterol  (VENTOLIN  HFA) 108 (90 Base) MCG/ACT inhaler SMARTSIG:1 Puff(s) Via Inhaler Every 4 Hours PRN 05/14/23   [provider]  ALPRAZolam  (XANAX ) 0.25 MG tablet Take 0.25 mg by mouth 3 (three) times daily as needed for anxiety or sleep. 08/10/15   [provider]  ARIPiprazole  (ABILIFY ) 5 MG tablet Take 5 mg by mouth at bedtime. 05/14/23   [provider]  aspirin  EC 325 MG tablet Take 650 mg by mouth at bedtime.    [provider]  desvenlafaxine  (PRISTIQ ) 100 MG 24 hr tablet Take 100 mg by mouth daily. 09/13/19   [provider]   Dexmethylphenidate  HCl 40 MG CP24 Take 1 capsule by mouth every morning. 05/20/23   [provider]  dicyclomine  (BENTYL ) 20 MG tablet Take 1 tablet (20 mg total) by mouth 2 (two) times daily. 06/12/23   Geiple, Joshua, PA-C  hydrochlorothiazide  (HYDRODIURIL ) 25 MG tablet Take 25 mg by mouth every morning. 08/01/21   [provider]  levothyroxine  (SYNTHROID ) 50 MCG tablet Take 50 mcg by mouth daily before breakfast.    [provider]  nadolol  (CORGARD ) 20 MG tablet Take 1 tablet (20 mg total) by mouth daily. Patient needs appt for # 90 supply or future refills. Please call office to schedule appt. 07/16/22   Camnitz, Babetta Lesch, MD  NONFORMULARY OR COMPOUNDED ITEM Place 1-4 drops into the right eye See admin instructions. Prednisolone  1% / Moxifloxacin  0.5% / Bromfenac 0.075% Opth Soln.  Instill 1 drop into the right eye four times daily for 4 days, then 1 drop twice a day for 2 days, then 1 drop once a day until finished.    [provider]  OVER THE COUNTER MEDICATION Take 2 tablets by mouth daily as needed. Equate indigestion relief.    [provider]  pramipexole  (MIRAPEX ) 0.25 MG tablet Take 0.5 mg by mouth at bedtime. 12/26/15   [provider]  pravastatin  (PRAVACHOL ) 80 MG tablet Take 80 mg by mouth daily. 08/01/21   [provider]  predniSONE  (DELTASONE ) 20 MG  tablet Take 1 tablet (20 mg total) by mouth daily with breakfast. 06/07/23   Myer Artis A, MD  promethazine  (PHENERGAN ) 25 MG tablet Take 1 tablet (25 mg total) by mouth every 6 (six) hours as needed for nausea or vomiting. 06/12/23   Lyna Sandhoff, PA-C  Tiotropium Bromide  Monohydrate (SPIRIVA  RESPIMAT) 1.25 MCG/ACT AERS Inhale 1 puff into the lungs daily. 05/19/23   Quillian Brunt, MD  traZODone  (DESYREL ) 50 MG tablet Take 50-150 mg by mouth at bedtime as needed. 03/20/23   [provider]      Allergies    Penicillins, Telmisartan, Zofran  [ondansetron ],  Iodinated contrast media, Iohexol , Latex, and Nsaids    Review of Systems   Review of Systems  Musculoskeletal:  Positive for back pain.  All other systems reviewed and are negative.   Physical Exam Updated Vital Signs BP (!) 193/97 (BP Location: Left Arm)   Pulse 66   Temp 98.1 F (36.7 C) (Oral)   Resp 18   SpO2 95%  Physical Exam Vitals and nursing note reviewed.  Constitutional:      General: She is not in acute distress.    Appearance: Normal appearance.  HENT:     Head: Normocephalic and atraumatic.     Mouth/Throat:     Mouth: Mucous membranes are moist.  Eyes:     Conjunctiva/sclera: Conjunctivae normal.     Pupils: Pupils are equal, round, and reactive to light.  Cardiovascular:     Rate and Rhythm: Normal rate and regular rhythm.     Pulses: Normal pulses.     Heart sounds: Normal heart sounds.  Pulmonary:     Effort: Pulmonary effort is normal.     Breath sounds: Normal breath sounds.  Abdominal:     Palpations: Abdomen is soft.     Tenderness: There is no abdominal tenderness.  Musculoskeletal:        General: Tenderness present. Normal range of motion.     Cervical back: Normal range of motion.     Comments: Midline tenderness to the lower thoracic/upper lumbar spine without step-off or deformity.  Range of motion, strength, and sensation of upper and lower extremities intact bilaterally.  Palpable pulses bilaterally.  Skin:    General: Skin is warm and dry.     Findings: No rash.  Neurological:     General: No focal deficit present.     Mental Status: She is alert.     Sensory: No sensory deficit.     Motor: No weakness.  Psychiatric:        Mood and Affect: Mood normal.        Behavior: Behavior normal.    ED Results / Procedures / Treatments   Labs (all labs ordered are listed, but only abnormal results are displayed) Labs Reviewed  CBC WITH DIFFERENTIAL/PLATELET - Abnormal; Notable for the following components:      Result Value   WBC 11.9  (*)    RBC 3.70 (*)    Hemoglobin 11.0 (*)    HCT 34.3 (*)    Neutro Abs 8.7 (*)    Monocytes Absolute 1.2 (*)    All other components within normal limits  COMPREHENSIVE METABOLIC PANEL WITH GFR - Abnormal; Notable for the following components:   Potassium 3.0 (*)    Glucose, Bld 104 (*)    Creatinine, Ser 1.08 (*)    Albumin 3.4 (*)    AST 14 (*)    GFR, Estimated 55 (*)  All other components within normal limits  URINALYSIS, ROUTINE W REFLEX MICROSCOPIC - Abnormal; Notable for the following components:   Color, Urine STRAW (*)    Leukocytes,Ua TRACE (*)    All other components within normal limits  URINE CULTURE  LIPASE, BLOOD    EKG None  Radiology CT Lumbar Spine Wo Contrast Result Date: 06/23/2023 CLINICAL DATA:  Trauma and back pain EXAM: CT THORACIC AND LUMBAR SPINE WITHOUT CONTRAST TECHNIQUE: Multidetector CT imaging of the thoracic and lumbar spine was performed without contrast. Multiplanar CT image reconstructions were also generated. RADIATION DOSE REDUCTION: This exam was performed according to the departmental dose-optimization program which includes automated exposure control, adjustment of the mA and/or kV according to patient size and/or use of iterative reconstruction technique. COMPARISON:  None Available. FINDINGS: CT THORACIC SPINE FINDINGS Alignment: Normal. Vertebrae: No acute fracture or focal pathologic process. Paraspinal and other soft tissues: Negative. Disc levels: No spinal canal stenosis CT LUMBAR SPINE FINDINGS Segmentation: 5 lumbar type vertebrae. Alignment: Grade 1 retrolisthesis at L3-4 and grade 1 anterolisthesis at L4-5 Vertebrae: L1 inferior endplate fracture is likely chronic. No other vertebral abnormality. Paraspinal and other soft tissues: Calcific aortic atherosclerosis. Disc levels: L3-4: Small disc bulge with mild facet hypertrophy. No spinal canal stenosis. L4-5: Severe facet arthrosis with grade 1 anterolisthesis and disc uncovering. No  spinal canal stenosis or neural foraminal stenosis. L5-S1: Moderate facet hypertrophy and small disc bulge. No stenosis. IMPRESSION: 1. No acute fracture or static subluxation of the thoracic or lumbar spine. 2. L1 inferior endplate fracture is likely chronic. 3. Severe L4-5 facet arthrosis with grade 1 anterolisthesis. Aortic Atherosclerosis (ICD10-I70.0). Electronically Signed   By: Juanetta Nordmann M.D.   On: 06/23/2023 00:26   CT Thoracic Spine Wo Contrast Result Date: 06/23/2023 CLINICAL DATA:  Trauma and back pain EXAM: CT THORACIC AND LUMBAR SPINE WITHOUT CONTRAST TECHNIQUE: Multidetector CT imaging of the thoracic and lumbar spine was performed without contrast. Multiplanar CT image reconstructions were also generated. RADIATION DOSE REDUCTION: This exam was performed according to the departmental dose-optimization program which includes automated exposure control, adjustment of the mA and/or kV according to patient size and/or use of iterative reconstruction technique. COMPARISON:  None Available. FINDINGS: CT THORACIC SPINE FINDINGS Alignment: Normal. Vertebrae: No acute fracture or focal pathologic process. Paraspinal and other soft tissues: Negative. Disc levels: No spinal canal stenosis CT LUMBAR SPINE FINDINGS Segmentation: 5 lumbar type vertebrae. Alignment: Grade 1 retrolisthesis at L3-4 and grade 1 anterolisthesis at L4-5 Vertebrae: L1 inferior endplate fracture is likely chronic. No other vertebral abnormality. Paraspinal and other soft tissues: Calcific aortic atherosclerosis. Disc levels: L3-4: Small disc bulge with mild facet hypertrophy. No spinal canal stenosis. L4-5: Severe facet arthrosis with grade 1 anterolisthesis and disc uncovering. No spinal canal stenosis or neural foraminal stenosis. L5-S1: Moderate facet hypertrophy and small disc bulge. No stenosis. IMPRESSION: 1. No acute fracture or static subluxation of the thoracic or lumbar spine. 2. L1 inferior endplate fracture is likely  chronic. 3. Severe L4-5 facet arthrosis with grade 1 anterolisthesis. Aortic Atherosclerosis (ICD10-I70.0). Electronically Signed   By: Juanetta Nordmann M.D.   On: 06/23/2023 00:26    Procedures Procedures    Medications Ordered in ED Medications  lidocaine  (LIDODERM ) 5 % 1 patch (1 patch Transdermal Patch Applied 06/23/23 0756)  oxyCODONE -acetaminophen  (PERCOCET/ROXICET) 5-325 MG per tablet 1 tablet (1 tablet Oral Given 06/23/23 0933)  promethazine  (PHENERGAN ) tablet 25 mg (25 mg Oral Given 06/23/23 0319)  potassium chloride  SA (KLOR-CON  M) CR  tablet 60 mEq (60 mEq Oral Given 06/23/23 0756)  metoCLOPramide  (REGLAN ) injection 5 mg (5 mg Intramuscular Given 06/23/23 0981)    ED Course/ Medical Decision Making/ A&P                                 Medical Decision Making Amount and/or Complexity of Data Reviewed Labs: ordered.  Risk Prescription drug management.   This patient presents to the ED for concern of back pain, this involves an extensive number of treatment options, and is a complaint that carries with it a high risk of complications and morbidity.   Differential diagnosis includes: fracture, dislocation, bulging disc, muscle strain, radiculopathy, etc. Low suspicion for cauda equina syndrome - no neurologic deficits. No urinary or bowel incontinence.   Comorbidities  See HPI above   Additional History  Additional history obtained from prior records   Lab Tests  I ordered and personally interpreted labs.  The pertinent results include:   Potassium of 3.0, otherwise CMP is within normal limits CBC within normal limits for patient UA shows trace leukocytes without bacteria. Culture pending.   Imaging Studies  CT thoracic and lumbar spine ordered.  Imaging shows: No acute fracture or static subluxation of thoracic or lumbar spine. L1 inferior endplate fracture, likely chronic. Severe L4-5 facet arthrosis with grade 1 anterolisthesis. I agree with the radiologist  interpretation   Problem List / ED Course / Critical Interventions / Medication Management  Patient reports she was moving over in bed when she began having back pain in the middle of the back, worse with movement. She is neurovascularly intact. No saddle anesthesia or changes to urinary/bowel habits. Low suspicion for cauda equina syndrome. I ordered medications including: Percocet and lidocaine  patch for pain Potassium for hypokalemia Reglan  for nausea Reevaluation of the patient after these medicines showed that the patient improved I have reviewed the patients home medicines and have made adjustments as needed Blood pressure was elevated throughout ED visit - could be related to pain. She denies chest pain or shortness of breath. Patient declined BP medication, states that she will take it when she gets home. Close PCP follow up recommended for reevaluation.   Social Determinants of Health  Physical activity   Test / Admission - Considered  Discussed findings with patient. All questions answered. She is stable and safe for discharge home. Information for orthopedics given for follow up if symptoms persist. Return precautions given.       Final Clinical Impression(s) / ED Diagnoses Final diagnoses:  Strain of lumbar region, initial encounter    Rx / DC Orders ED Discharge Orders          Ordered    oxyCODONE -acetaminophen  (PERCOCET/ROXICET) 5-325 MG tablet  Every 6 hours PRN        06/23/23 1121              Sonnie Dusky, PA-C 06/23/23 1510    Ninetta Basket, MD 06/28/23 [phone]

## 2023-06-23 NOTE — Discharge Instructions (Addendum)
 Your back imaging does not show any acute changes from your injury last night. Take Percocet every 6 hours as needed for pain. Do not drive while taking this medication because it can cause drowsiness. Use OTC Lidocaine  patches as needed for additional pain as well.   I have provided info for orthopedics to follow up with of pain persists.   Take your BP medications when you get home, since it is elevated in the ED today.  Get help right away if: Your back pain is severe. You are unable to stand or walk. You develop pain in your legs. You have weakness in your buttocks or legs. You have trouble controlling when you urinate or when you have a bowel movement. You have frequent, painful, or bloody urination.

## 2023-06-24 NOTE — Telephone Encounter (Signed)
 See note from 4/16 from Dr. Gaynell Keeler, meds sent I for patient.  Nothing further needed.

## 2023-06-25 ENCOUNTER — Encounter (HOSPITAL_BASED_OUTPATIENT_CLINIC_OR_DEPARTMENT_OTHER)

## 2023-06-25 DIAGNOSIS — R110 Nausea: Secondary | ICD-10-CM | POA: Diagnosis not present

## 2023-06-25 DIAGNOSIS — M545 Low back pain, unspecified: Secondary | ICD-10-CM | POA: Diagnosis not present

## 2023-06-25 DIAGNOSIS — R1084 Generalized abdominal pain: Secondary | ICD-10-CM | POA: Diagnosis not present

## 2023-06-25 DIAGNOSIS — R109 Unspecified abdominal pain: Secondary | ICD-10-CM | POA: Diagnosis not present

## 2023-06-25 LAB — URINE CULTURE: Culture: 80000 — AB

## 2023-06-26 ENCOUNTER — Telehealth (HOSPITAL_BASED_OUTPATIENT_CLINIC_OR_DEPARTMENT_OTHER): Payer: Self-pay | Admitting: *Deleted

## 2023-06-26 NOTE — Telephone Encounter (Signed)
 Post ED Visit - Positive Culture Follow-up  Culture report reviewed by antimicrobial stewardship pharmacist: [facility] []  Court [place], Pharm.D. []  Skeet Duke, Pharm.D., BCPS AQ-ID []  Leslee Rase, Pharm.D., BCPS []  Garland Junk, Pharm.D., BCPS []  [place], [address].D., BCPS, AAHIVP []  Alcide Aly, Pharm.D., BCPS, AAHIVP []  Jerri Morale, PharmD, BCPS []  Graham Laws, PharmD, BCPS []  Cleda Curly, PharmD, BCPS []  Tamar Fairly, PharmD []  Ballard Levels, PharmD, BCPS []  Ollen Beverage, PharmD  [facility] [x]  Estela Held, PharmD []  Sherryle Don, PharmD []  Van Gelinas, PharmD []  Delila Felty, Rph []  [place]) Cleora Daft, PharmD []  Augustina Block, PharmD []  Arie Kurtz, PharmD []  Sharlyn Deaner, PharmD []  Agnes Hose, PharmD []  Kendall Pauls, PharmD []  Gladstone Lamer, PharmD []  Armanda Bern, PharmD []  Tera Fellows, PharmD   Positive urine culture No UTI symptoms.  No further patient follow-up is required at this time.  Jessee Mormon 06/26/2023, 9:48 AM

## 2023-06-27 ENCOUNTER — Emergency Department (HOSPITAL_BASED_OUTPATIENT_CLINIC_OR_DEPARTMENT_OTHER)

## 2023-06-27 ENCOUNTER — Encounter (HOSPITAL_BASED_OUTPATIENT_CLINIC_OR_DEPARTMENT_OTHER): Payer: Self-pay

## 2023-06-27 ENCOUNTER — Inpatient Hospital Stay (HOSPITAL_BASED_OUTPATIENT_CLINIC_OR_DEPARTMENT_OTHER)
Admission: EM | Admit: 2023-06-27 | Discharge: 2023-07-01 | DRG: 641 | Disposition: A | Discharge status: Home Health | Attending: Family Medicine | Admitting: Family Medicine

## 2023-06-27 ENCOUNTER — Other Ambulatory Visit: Payer: Self-pay

## 2023-06-27 DIAGNOSIS — Z886 Allergy status to analgesic agent status: Secondary | ICD-10-CM

## 2023-06-27 DIAGNOSIS — Z83438 Family history of other disorder of lipoprotein metabolism and other lipidemia: Secondary | ICD-10-CM | POA: Diagnosis not present

## 2023-06-27 DIAGNOSIS — K449 Diaphragmatic hernia without obstruction or gangrene: Secondary | ICD-10-CM | POA: Diagnosis not present

## 2023-06-27 DIAGNOSIS — Z85820 Personal history of malignant melanoma of skin: Secondary | ICD-10-CM

## 2023-06-27 DIAGNOSIS — E639 Nutritional deficiency, unspecified: Secondary | ICD-10-CM | POA: Diagnosis present

## 2023-06-27 DIAGNOSIS — Z90710 Acquired absence of both cervix and uterus: Secondary | ICD-10-CM

## 2023-06-27 DIAGNOSIS — Z803 Family history of malignant neoplasm of breast: Secondary | ICD-10-CM

## 2023-06-27 DIAGNOSIS — Z6841 Body Mass Index (BMI) 40.0 and over, adult: Secondary | ICD-10-CM

## 2023-06-27 DIAGNOSIS — Z888 Allergy status to other drugs, medicaments and biological substances status: Secondary | ICD-10-CM

## 2023-06-27 DIAGNOSIS — E871 Hypo-osmolality and hyponatremia: Secondary | ICD-10-CM | POA: Diagnosis not present

## 2023-06-27 DIAGNOSIS — Z91040 Latex allergy status: Secondary | ICD-10-CM

## 2023-06-27 DIAGNOSIS — G9340 Encephalopathy, unspecified: Secondary | ICD-10-CM | POA: Diagnosis not present

## 2023-06-27 DIAGNOSIS — K5900 Constipation, unspecified: Secondary | ICD-10-CM | POA: Diagnosis not present

## 2023-06-27 DIAGNOSIS — Z880 Allergy status to penicillin: Secondary | ICD-10-CM

## 2023-06-27 DIAGNOSIS — E876 Hypokalemia: Secondary | ICD-10-CM | POA: Insufficient documentation

## 2023-06-27 DIAGNOSIS — M479 Spondylosis, unspecified: Secondary | ICD-10-CM | POA: Diagnosis present

## 2023-06-27 DIAGNOSIS — Z79890 Hormone replacement therapy (postmenopausal): Secondary | ICD-10-CM

## 2023-06-27 DIAGNOSIS — E7800 Pure hypercholesterolemia, unspecified: Secondary | ICD-10-CM | POA: Diagnosis not present

## 2023-06-27 DIAGNOSIS — K5730 Diverticulosis of large intestine without perforation or abscess without bleeding: Secondary | ICD-10-CM | POA: Diagnosis not present

## 2023-06-27 DIAGNOSIS — Z9049 Acquired absence of other specified parts of digestive tract: Secondary | ICD-10-CM | POA: Diagnosis not present

## 2023-06-27 DIAGNOSIS — E039 Hypothyroidism, unspecified: Secondary | ICD-10-CM | POA: Diagnosis present

## 2023-06-27 DIAGNOSIS — Z8249 Family history of ischemic heart disease and other diseases of the circulatory system: Secondary | ICD-10-CM

## 2023-06-27 DIAGNOSIS — I10 Essential (primary) hypertension: Secondary | ICD-10-CM | POA: Diagnosis present

## 2023-06-27 DIAGNOSIS — R109 Unspecified abdominal pain: Secondary | ICD-10-CM | POA: Diagnosis not present

## 2023-06-27 DIAGNOSIS — J45909 Unspecified asthma, uncomplicated: Secondary | ICD-10-CM | POA: Diagnosis present

## 2023-06-27 DIAGNOSIS — Z8673 Personal history of transient ischemic attack (TIA), and cerebral infarction without residual deficits: Secondary | ICD-10-CM | POA: Diagnosis not present

## 2023-06-27 DIAGNOSIS — K30 Functional dyspepsia: Secondary | ICD-10-CM | POA: Diagnosis present

## 2023-06-27 DIAGNOSIS — Z79899 Other long term (current) drug therapy: Secondary | ICD-10-CM

## 2023-06-27 DIAGNOSIS — G8929 Other chronic pain: Secondary | ICD-10-CM | POA: Diagnosis not present

## 2023-06-27 DIAGNOSIS — M545 Low back pain, unspecified: Secondary | ICD-10-CM | POA: Diagnosis present

## 2023-06-27 DIAGNOSIS — Z91041 Radiographic dye allergy status: Secondary | ICD-10-CM

## 2023-06-27 DIAGNOSIS — E861 Hypovolemia: Secondary | ICD-10-CM | POA: Diagnosis not present

## 2023-06-27 DIAGNOSIS — E66813 Obesity, class 3: Secondary | ICD-10-CM | POA: Diagnosis present

## 2023-06-27 DIAGNOSIS — I700 Atherosclerosis of aorta: Secondary | ICD-10-CM | POA: Diagnosis not present

## 2023-06-27 DIAGNOSIS — R110 Nausea: Secondary | ICD-10-CM | POA: Diagnosis present

## 2023-06-27 LAB — URINALYSIS, ROUTINE W REFLEX MICROSCOPIC
Bilirubin Urine: NEGATIVE
Glucose, UA: NEGATIVE mg/dL
Hgb urine dipstick: NEGATIVE
Ketones, ur: NEGATIVE mg/dL
Nitrite: NEGATIVE
Specific Gravity, Urine: 1.016 (ref 1.005–1.030)
pH: 6.5 (ref 5.0–8.0)

## 2023-06-27 LAB — CBC
HCT: 36.5 % (ref 36.0–46.0)
Hemoglobin: 12.8 g/dL (ref 12.0–15.0)
MCH: 29.8 pg (ref 26.0–34.0)
MCHC: 35.1 g/dL (ref 30.0–36.0)
MCV: 84.9 fL (ref 80.0–100.0)
Platelets: 380 10*3/uL (ref 150–400)
RBC: 4.3 MIL/uL (ref 3.87–5.11)
RDW: 13 % (ref 11.5–15.5)
WBC: 9.2 10*3/uL (ref 4.0–10.5)
nRBC: 0 % (ref 0.0–0.2)

## 2023-06-27 LAB — SODIUM, URINE, RANDOM: Sodium, Ur: 119 mmol/L

## 2023-06-27 LAB — COMPREHENSIVE METABOLIC PANEL WITH GFR
ALT: 19 U/L (ref 0–44)
AST: 18 U/L (ref 15–41)
Albumin: 4.1 g/dL (ref 3.5–5.0)
Alkaline Phosphatase: 77 U/L (ref 38–126)
Anion gap: 13 (ref 5–15)
BUN: 13 mg/dL (ref 8–23)
CO2: 24 mmol/L (ref 22–32)
Calcium: 9.1 mg/dL (ref 8.9–10.3)
Chloride: 88 mmol/L — ABNORMAL LOW (ref 98–111)
Creatinine, Ser: 0.9 mg/dL (ref 0.44–1.00)
GFR, Estimated: >60 mL/min (ref >60)
Glucose, Bld: 116 mg/dL — ABNORMAL HIGH (ref 70–99)
Potassium: 3.2 mmol/L — ABNORMAL LOW (ref 3.5–5.1)
Sodium: 124 mmol/L — ABNORMAL LOW (ref 135–145)
Total Bilirubin: 0.3 mg/dL (ref 0.0–1.2)
Total Protein: 6.4 g/dL — ABNORMAL LOW (ref 6.5–8.1)

## 2023-06-27 LAB — OSMOLALITY, URINE: Osmolality, Ur: 471 mosm/kg (ref 300–900)

## 2023-06-27 LAB — LIPASE, BLOOD: Lipase: 14 U/L (ref 11–51)

## 2023-06-27 MED ORDER — MORPHINE SULFATE (PF) 2 MG/ML IV SOLN
2.0000 mg | Freq: Once | INTRAVENOUS | Status: AC
  Administered 2023-06-27: 2 mg via INTRAVENOUS
  Filled 2023-06-27: qty 1

## 2023-06-27 MED ORDER — SODIUM CHLORIDE 0.9 % IV BOLUS
500.0000 mL | Freq: Once | INTRAVENOUS | Status: AC
  Administered 2023-06-27: 500 mL via INTRAVENOUS

## 2023-06-27 MED ORDER — HYDRALAZINE HCL 20 MG/ML IJ SOLN
10.0000 mg | Freq: Once | INTRAMUSCULAR | Status: AC
  Administered 2023-06-27: 10 mg via INTRAVENOUS
  Filled 2023-06-27: qty 1

## 2023-06-27 MED ORDER — LORAZEPAM 2 MG/ML IJ SOLN
0.5000 mg | Freq: Once | INTRAMUSCULAR | Status: AC
  Administered 2023-06-27: 0.5 mg via INTRAVENOUS
  Filled 2023-06-27: qty 1

## 2023-06-27 NOTE — ED Triage Notes (Signed)
 Patient presents with generalized abd pain that started 3 days ago. Reports she has not had a bowel movement since. +nausea, vomiting

## 2023-06-27 NOTE — ED Provider Notes (Signed)
  EMERGENCY DEPARTMENT AT [facility] Provider Note   CSN: [id number] Arrival date & time: 06/27/23  1804     History  Chief Complaint  Patient presents with   Abdominal Pain    Becky [name] is a 73 y.o. female.  Is a 73 year old female presenting emergency department for generalized abdominal pain x 7 days.  No bowel movement x 3.  Nausea started today.  History of prior appendectomy cholecystectomy and hysterectomy.  Reports that she has not passed gas today.  No chest pain or shortness of breath.  No fevers.   Abdominal Pain      Home Medications Prior to Admission medications   Medication Sig Start Date End Date Taking? Authorizing Provider  acyclovir (ZOVIRAX) 200 MG capsule as needed. 09/05/14   [provider]  albuterol  (VENTOLIN  HFA) 108 (90 Base) MCG/ACT inhaler SMARTSIG:1 Puff(s) Via Inhaler Every 4 Hours PRN 05/14/23   [provider]  ALPRAZolam  (XANAX ) 0.25 MG tablet Take 0.25 mg by mouth 3 (three) times daily as needed for anxiety or sleep. 08/10/15   [provider]  ARIPiprazole  (ABILIFY ) 5 MG tablet Take 5 mg by mouth at bedtime. 05/14/23   [provider]  aspirin  EC 325 MG tablet Take 650 mg by mouth at bedtime.    [provider]  azithromycin  (ZITHROMAX  Z-PAK) 250 MG tablet Take 2 tablets day 1 and then 1 daily for 4 days 06/17/23   Margaretann Sharper, MD  desvenlafaxine  (PRISTIQ ) 100 MG 24 hr tablet Take 100 mg by mouth daily. 09/13/19   [provider]  Dexmethylphenidate  HCl 40 MG CP24 Take 1 capsule by mouth every morning. 05/20/23   [provider]  dicyclomine  (BENTYL ) 20 MG tablet Take 1 tablet (20 mg total) by mouth 2 (two) times daily. 06/12/23   Lyna Sandhoff, PA-C  hydrochlorothiazide  (HYDRODIURIL ) 25 MG tablet Take 25 mg by mouth every morning. 08/01/21   [provider]  levothyroxine  (SYNTHROID ) 50 MCG tablet Take 50 mcg by mouth daily before breakfast.     [provider]  nadolol  (CORGARD ) 20 MG tablet Take 1 tablet (20 mg total) by mouth daily. Patient needs appt for # 90 supply or future refills. Please call office to schedule appt. 07/16/22   Camnitz, Babetta Lesch, MD  NONFORMULARY OR COMPOUNDED ITEM Place 1-4 drops into the right eye See admin instructions. Prednisolone  1% / Moxifloxacin  0.5% / Bromfenac 0.075% Opth Soln.  Instill 1 drop into the right eye four times daily for 4 days, then 1 drop twice a day for 2 days, then 1 drop once a day until finished.    [provider]  OVER THE COUNTER MEDICATION Take 2 tablets by mouth daily as needed. Equate indigestion relief.    [provider]  oxyCODONE -acetaminophen  (PERCOCET/ROXICET) 5-325 MG tablet Take 1 tablet by mouth every 6 (six) hours as needed for up to 5 days for severe pain (pain score 7-10). 06/23/23 06/28/23  Sonnie Dusky, PA-C  pramipexole  (MIRAPEX ) 0.25 MG tablet Take 0.5 mg by mouth at bedtime. 12/26/15   [provider]  pravastatin  (PRAVACHOL ) 80 MG tablet Take 80 mg by mouth daily. 08/01/21   [provider]  predniSONE  (DELTASONE ) 20 MG tablet Take 1 tablet (20 mg total) by mouth daily with breakfast. 06/07/23   Myer Artis A, MD  predniSONE  (DELTASONE ) 20 MG tablet Take 1 tablet (20 mg total) by mouth daily with breakfast. 06/17/23   Margaretann Sharper, MD  promethazine  (  PHENERGAN ) 25 MG tablet Take 1 tablet (25 mg total) by mouth every 6 (six) hours as needed for nausea or vomiting. 06/12/23   Lyna Sandhoff, PA-C  Tiotropium Bromide Monohydrate  (SPIRIVA  RESPIMAT) 1.25 MCG/ACT AERS Inhale 1 puff into the lungs daily. 05/19/23   Quillian Brunt, MD  traZODone  (DESYREL ) 50 MG tablet Take 50-150 mg by mouth at bedtime as needed. 03/20/23   [provider]      Allergies    Penicillins, Telmisartan, Zofran  [ondansetron ], Iodinated contrast media, Iohexol , Latex, and Nsaids    Review of Systems   Review of Systems   Gastrointestinal:  Positive for abdominal pain.    Physical Exam Updated Vital Signs BP (!) 193/97 (BP Location: Right Arm)   Pulse 78   Temp 98.7 F (37.1 C) (Oral)   Resp (!) 25   SpO2 99%  Physical Exam Vitals and nursing note reviewed.  Constitutional:      Appearance: She is obese.  HENT:     Head: Normocephalic and atraumatic.  Cardiovascular:     Rate and Rhythm: Normal rate and regular rhythm.  Pulmonary:     Effort: Pulmonary effort is normal.     Breath sounds: Normal breath sounds.  Abdominal:     General: There is distension.     Palpations: Abdomen is soft.     Tenderness: There is generalized abdominal tenderness. There is no guarding or rebound.  Skin:    General: Skin is warm and dry.  Neurological:     Mental Status: She is alert.     ED Results / Procedures / Treatments   Labs (all labs ordered are listed, but only abnormal results are displayed) Labs Reviewed  COMPREHENSIVE METABOLIC PANEL WITH GFR - Abnormal; Notable for the following components:      Result Value   Sodium 124 (*)    Potassium 3.2 (*)    Chloride 88 (*)    Glucose, Bld 116 (*)    Total Protein 6.4 (*)    All other components within normal limits  URINALYSIS, ROUTINE W REFLEX MICROSCOPIC - Abnormal; Notable for the following components:   Protein, ur TRACE (*)    Leukocytes,Ua TRACE (*)    Bacteria, UA RARE (*)    All other components within normal limits  LIPASE, BLOOD  CBC  SODIUM, URINE, RANDOM  OSMOLALITY, URINE    EKG EKG Interpretation Date/Time:  Saturday June 27 2023 18:37:52 EDT Ventricular Rate:  65 PR Interval:  138 QRS Duration:  84 QT Interval:  604 QTC Calculation: 628 R Axis:   1  Text Interpretation: Critical Test Result: Long QTc Normal sinus rhythm Cannot rule out Anterior infarct , age undetermined Abnormal ECG When compared with ECG of 13-May-2023 14:23, Questionable change in QRS axis Nonspecific T wave abnormality, improved in Inferior leads  T wave inversion no longer evident in Lateral leads QT has lengthened Confirmed by Elise Guile [phone]) on 06/27/2023 6:56:19 PM  Radiology CT ABDOMEN PELVIS WO CONTRAST Result Date: 06/27/2023 CLINICAL DATA:  Bowel obstruction suspected, abdominal pain EXAM: CT ABDOMEN AND PELVIS WITHOUT CONTRAST TECHNIQUE: Multidetector CT imaging of the abdomen and pelvis was performed following the standard protocol without IV contrast. Unenhanced CT was performed per clinician order. Lack of IV contrast limits sensitivity and specificity, especially for evaluation of abdominal/pelvic solid viscera. RADIATION DOSE REDUCTION: This exam was performed according to the departmental dose-optimization program which includes automated exposure control, adjustment of the mA and/or kV according to patient size and/or  use of iterative reconstruction technique. COMPARISON:  06/12/2023 FINDINGS: Lower chest: No acute pleural or parenchymal lung disease. Hepatobiliary: Cholecystectomy. Unremarkable unenhanced appearance of the liver. No biliary duct dilation. Pancreas: Unremarkable unenhanced appearance. Spleen: Unremarkable unenhanced appearance. Adrenals/Urinary Tract: No urinary tract calculi or obstructive uropathy within either kidney. The adrenals are stable. Bladder is unremarkable. Stomach/Bowel: No bowel obstruction or ileus. The appendix is surgically absent. Scattered diverticulosis of the descending and sigmoid colon without evidence of acute diverticulitis. No bowel wall thickening or inflammatory change. Small hiatal hernia. Vascular/Lymphatic: Aortic atherosclerosis. No enlarged abdominal or pelvic lymph nodes. Reproductive: Status post hysterectomy. No adnexal masses. Other: No free fluid or free intraperitoneal gas. No abdominal wall hernia. Musculoskeletal: No acute or destructive bony abnormalities. Chronic compression deformity inferior endplate L1 vertebral body unchanged. Reconstructed images demonstrate no  additional findings. IMPRESSION: 1. No acute intra-abdominal or intrapelvic process. 2. Distal colonic diverticulosis without diverticulitis. 3.  Aortic Atherosclerosis (ICD10-I70.0). Electronically Signed   By: Bobbye Burrow M.D.   On: 06/27/2023 20:47    Procedures .Critical Care  Performed by: Rolinda Climes, DO Authorized by: Rolinda Climes, DO   Critical care provider statement:    Critical care time (minutes):  30   Critical care was necessary to treat or prevent imminent or life-threatening deterioration of the following conditions:  Metabolic crisis   Critical care was time spent personally by me on the following activities:  Development of treatment plan with patient or surrogate, discussions with consultants, evaluation of patient's response to treatment, examination of patient, ordering and review of laboratory studies, ordering and review of radiographic studies, ordering and performing treatments and interventions, pulse oximetry, re-evaluation of patient's condition and review of old charts   Care discussed with: admitting provider       Medications Ordered in ED Medications  morphine  (PF) 2 MG/ML injection 2 mg (2 mg Intravenous Given 06/27/23 1922)  sodium chloride  0.9 % bolus 500 mL ( Intravenous Stopped 06/27/23 1945)  LORazepam  (ATIVAN ) injection 0.5 mg (0.5 mg Intravenous Given 06/27/23 1933)  hydrALAZINE  (APRESOLINE ) injection 10 mg (10 mg Intravenous Given 06/27/23 2055)  morphine  (PF) 2 MG/ML injection 2 mg (2 mg Intravenous Given 06/27/23 2227)    ED Course/ Medical Decision Making/ A&P Clinical Course as of 06/27/23 2300  Sat Jun 27, 2023  1944 Most recent echo :"1. Left ventricular ejection fraction, by estimation, is 65 to 70%. The  left ventricle has normal function. The left ventricle has no regional  wall motion abnormalities. There is mild concentric left ventricular "  [TY]  2101 CT ABDOMEN PELVIS WO CONTRAST IMPRESSION: 1. No acute intra-abdominal or  intrapelvic process. 2. Distal colonic diverticulosis without diverticulitis. 3.  Aortic Atherosclerosis (ICD10-I70.0).   [TY]  2258 Case discussed with hospitalist, Dr. Achilles Holes who agrees to admit patient for hyponatremia.  Also having elevated blood pressure likely secondary to pain.  This required multiple doses of IV narcotics. [TY]    Clinical Course User Index [TY] Rolinda Climes, DO                                 Medical Decision Making This is an uncomfortable appearing 73 year old female presenting emergency department for generalized abdominal pain with no bowel movement x 3 days with nausea and vomiting.  She is afebrile, nontachycardic, is hypertensive.  Appears uncomfortable on exam, soft abdomen, but is distended and has some generalized tenderness.  Does  have prior surgical history that would predispose her to bowel obstruction.  Will get CT scan.  Per chart review was seen several days ago and prescribed narcotics; this could be driving ileus type picture.  Basic labs ordered as well.  See ED course for interpretation as well as further MDM and disposition.  Amount and/or Complexity of Data Reviewed External Data Reviewed:     Details: Reportedly takes salt tablets per her report; I do not see record of that in chart.  Also appears sodium levels essentially normal close to a week ago Labs: ordered. Decision-making details documented in ED Course. Radiology: ordered and independent interpretation performed. Decision-making details documented in ED Course.    Details: Do not appreciate free air on CT scan.  Risk Prescription drug management. Parenteral controlled substances. Decision regarding hospitalization. Diagnosis or treatment significantly limited by social determinants of health. Risk Details: Poor health literacy.          Final Clinical Impression(s) / ED Diagnoses Final diagnoses:  None    Rx / DC Orders ED Discharge Orders     None          Rolinda Climes, DO 06/27/23 2300

## 2023-06-27 NOTE — Progress Notes (Signed)
 Plan of Care Note for accepted transfer   Patient: Becky [name] MRN: [id number]   DOA: 06/27/2023  Facility requesting transfer: [facility] ED Requesting Provider:  Elise Guile, DO Reason for transfer: Hyponatremia Facility course: Becky [name] is a 73 y.o. female, with history of hypertension, CVA, TIA, asthma, GERD, NAFLD, hypothyroidism, and CKD, presenting emergency department for generalized abdominal pain x 7 days.  No bowel movement x 3.  Nausea started today.  History of prior appendectomy cholecystectomy and hysterectomy.  Reports that she has not passed gas today.  No chest pain or shortness of breath.  No fevers.  Upon presentation to the ER, BP was 187/89 and later 195/126 with otherwise normal vital signs.  Labs revealed sodium 124 and chloride 88, potassium 3.2 glucose 116 with otherwise unremarkable CMP.  CBC was normal.  UA showed 11-20 WBCs with rare bacteria and and trace leukocytes.  The patient was given 10 mg of IV hydralazine , 0.5 mg of IV Ativan , 2 mg of IV morphine  sulfate twice, and 500 mL IV normal saline bolus.  Abdominal pelvic CT scan without contrast revealed aortic atherosclerosis, distal colonic diverticulosis without diverticulitis with no acute process.  The patient will be admitted to an observation medical telemetry bed at [facility].    Plan of care: The patient is accepted for admission to Telemetry unit, at [facility]..  The patient will be under the care and responsibility of the EDP until arrival to [facility].  Author: Virgene Griffin, MD 06/27/2023  Check [email or web address] for on-call coverage.  Nursing staff, Please call [facility] Admits & Consults System-Wide number on Amion as soon as patient's arrival, so appropriate admitting provider can evaluate the pt.

## 2023-06-27 NOTE — ED Notes (Signed)
 Patient to CT via stretcher.

## 2023-06-28 ENCOUNTER — Emergency Department (HOSPITAL_BASED_OUTPATIENT_CLINIC_OR_DEPARTMENT_OTHER)

## 2023-06-28 DIAGNOSIS — E876 Hypokalemia: Secondary | ICD-10-CM | POA: Diagnosis not present

## 2023-06-28 DIAGNOSIS — Z6841 Body Mass Index (BMI) 40.0 and over, adult: Secondary | ICD-10-CM | POA: Diagnosis not present

## 2023-06-28 DIAGNOSIS — Z803 Family history of malignant neoplasm of breast: Secondary | ICD-10-CM | POA: Diagnosis not present

## 2023-06-28 DIAGNOSIS — M47816 Spondylosis without myelopathy or radiculopathy, lumbar region: Secondary | ICD-10-CM | POA: Diagnosis not present

## 2023-06-28 DIAGNOSIS — Z9049 Acquired absence of other specified parts of digestive tract: Secondary | ICD-10-CM | POA: Diagnosis not present

## 2023-06-28 DIAGNOSIS — G9389 Other specified disorders of brain: Secondary | ICD-10-CM | POA: Diagnosis not present

## 2023-06-28 DIAGNOSIS — E871 Hypo-osmolality and hyponatremia: Secondary | ICD-10-CM | POA: Diagnosis not present

## 2023-06-28 DIAGNOSIS — E861 Hypovolemia: Secondary | ICD-10-CM | POA: Diagnosis present

## 2023-06-28 DIAGNOSIS — E7800 Pure hypercholesterolemia, unspecified: Secondary | ICD-10-CM | POA: Diagnosis present

## 2023-06-28 DIAGNOSIS — Z8673 Personal history of transient ischemic attack (TIA), and cerebral infarction without residual deficits: Secondary | ICD-10-CM | POA: Diagnosis not present

## 2023-06-28 DIAGNOSIS — I6782 Cerebral ischemia: Secondary | ICD-10-CM | POA: Diagnosis not present

## 2023-06-28 DIAGNOSIS — Z90710 Acquired absence of both cervix and uterus: Secondary | ICD-10-CM | POA: Diagnosis not present

## 2023-06-28 DIAGNOSIS — Z83438 Family history of other disorder of lipoprotein metabolism and other lipidemia: Secondary | ICD-10-CM | POA: Diagnosis not present

## 2023-06-28 DIAGNOSIS — I10 Essential (primary) hypertension: Secondary | ICD-10-CM | POA: Diagnosis not present

## 2023-06-28 DIAGNOSIS — Z85820 Personal history of malignant melanoma of skin: Secondary | ICD-10-CM | POA: Diagnosis not present

## 2023-06-28 DIAGNOSIS — K5730 Diverticulosis of large intestine without perforation or abscess without bleeding: Secondary | ICD-10-CM | POA: Diagnosis present

## 2023-06-28 DIAGNOSIS — E039 Hypothyroidism, unspecified: Secondary | ICD-10-CM | POA: Diagnosis present

## 2023-06-28 DIAGNOSIS — G8929 Other chronic pain: Secondary | ICD-10-CM | POA: Diagnosis present

## 2023-06-28 DIAGNOSIS — E66813 Obesity, class 3: Secondary | ICD-10-CM | POA: Diagnosis present

## 2023-06-28 DIAGNOSIS — E639 Nutritional deficiency, unspecified: Secondary | ICD-10-CM | POA: Diagnosis present

## 2023-06-28 DIAGNOSIS — M5136 Other intervertebral disc degeneration, lumbar region with discogenic back pain only: Secondary | ICD-10-CM | POA: Diagnosis not present

## 2023-06-28 DIAGNOSIS — M47814 Spondylosis without myelopathy or radiculopathy, thoracic region: Secondary | ICD-10-CM | POA: Diagnosis not present

## 2023-06-28 DIAGNOSIS — Z8249 Family history of ischemic heart disease and other diseases of the circulatory system: Secondary | ICD-10-CM | POA: Diagnosis not present

## 2023-06-28 DIAGNOSIS — Z79890 Hormone replacement therapy (postmenopausal): Secondary | ICD-10-CM | POA: Diagnosis not present

## 2023-06-28 DIAGNOSIS — M5137 Other intervertebral disc degeneration, lumbosacral region with discogenic back pain only: Secondary | ICD-10-CM | POA: Diagnosis not present

## 2023-06-28 DIAGNOSIS — I700 Atherosclerosis of aorta: Secondary | ICD-10-CM | POA: Diagnosis present

## 2023-06-28 DIAGNOSIS — K5900 Constipation, unspecified: Secondary | ICD-10-CM | POA: Diagnosis present

## 2023-06-28 DIAGNOSIS — J45909 Unspecified asthma, uncomplicated: Secondary | ICD-10-CM | POA: Diagnosis present

## 2023-06-28 DIAGNOSIS — G9340 Encephalopathy, unspecified: Secondary | ICD-10-CM | POA: Diagnosis present

## 2023-06-28 LAB — BASIC METABOLIC PANEL WITH GFR
Anion gap: 13 (ref 5–15)
Anion gap: 10 (ref 5–15)
Anion gap: 8 (ref 5–15)
BUN: 11 mg/dL (ref 8–23)
BUN: 14 mg/dL (ref 8–23)
BUN: 14 mg/dL (ref 8–23)
CO2: 26 mmol/L (ref 22–32)
CO2: 27 mmol/L (ref 22–32)
CO2: 28 mmol/L (ref 22–32)
Calcium: 9 mg/dL (ref 8.9–10.3)
Calcium: 9 mg/dL (ref 8.9–10.3)
Calcium: 8.7 mg/dL — ABNORMAL LOW (ref 8.9–10.3)
Chloride: 86 mmol/L — ABNORMAL LOW (ref 98–111)
Chloride: 87 mmol/L — ABNORMAL LOW (ref 98–111)
Chloride: 87 mmol/L — ABNORMAL LOW (ref 98–111)
Creatinine, Ser: 0.73 mg/dL (ref 0.44–1.00)
Creatinine, Ser: 1 mg/dL (ref 0.44–1.00)
Creatinine, Ser: 0.96 mg/dL (ref 0.44–1.00)
GFR, Estimated: >60 mL/min (ref >60)
GFR, Estimated: 60 mL/min — ABNORMAL LOW (ref >60)
GFR, Estimated: >60 mL/min (ref >60)
Glucose, Bld: 91 mg/dL (ref 70–99)
Glucose, Bld: 105 mg/dL — ABNORMAL HIGH (ref 70–99)
Glucose, Bld: 96 mg/dL (ref 70–99)
Potassium: 2.9 mmol/L — ABNORMAL LOW (ref 3.5–5.1)
Potassium: 3.1 mmol/L — ABNORMAL LOW (ref 3.5–5.1)
Potassium: 3 mmol/L — ABNORMAL LOW (ref 3.5–5.1)
Sodium: 124 mmol/L — ABNORMAL LOW (ref 135–145)
Sodium: 124 mmol/L — ABNORMAL LOW (ref 135–145)
Sodium: 123 mmol/L — ABNORMAL LOW (ref 135–145)

## 2023-06-28 LAB — MAGNESIUM
Magnesium: 1.7 mg/dL (ref 1.7–2.4)
Magnesium: 1.6 mg/dL — ABNORMAL LOW (ref 1.7–2.4)

## 2023-06-28 LAB — CBG MONITORING, ED: Glucose-Capillary: 112 mg/dL — ABNORMAL HIGH (ref 70–99)

## 2023-06-28 LAB — AMMONIA: Ammonia: 19 umol/L (ref 9–35)

## 2023-06-28 LAB — OSMOLALITY: Osmolality: 259 mosm/kg — ABNORMAL LOW (ref 275–295)

## 2023-06-28 LAB — TSH: TSH: 0.515 u[IU]/mL (ref 0.350–4.500)

## 2023-06-28 MED ORDER — SODIUM CHLORIDE 0.9 % IV SOLN: INTRAVENOUS | Status: AC

## 2023-06-28 MED ORDER — ALBUTEROL SULFATE (2.5 MG/3ML) 0.083% IN NEBU: 2.5000 mg | INHALATION_SOLUTION | RESPIRATORY_TRACT | Status: DC | PRN

## 2023-06-28 MED ORDER — PROMETHAZINE HCL 25 MG PO TABS
25.0000 mg | ORAL_TABLET | Freq: Once | ORAL | Status: AC
  Administered 2023-06-28: 25 mg via ORAL
  Filled 2023-06-28: qty 1

## 2023-06-28 MED ORDER — LEVOTHYROXINE SODIUM 50 MCG PO TABS
50.0000 ug | ORAL_TABLET | Freq: Every day | ORAL | Status: DC
  Administered 2023-06-29 – 2023-07-01 (×3): 50 ug via ORAL
  Filled 2023-06-28 (×3): qty 1

## 2023-06-28 MED ORDER — TIOTROPIUM BROMIDE MONOHYDRATE 1.25 MCG/ACT IN AERS: 1.0000 | INHALATION_SPRAY | Freq: Every day | RESPIRATORY_TRACT | Status: DC

## 2023-06-28 MED ORDER — MORPHINE SULFATE (PF) 2 MG/ML IV SOLN
1.0000 mg | INTRAVENOUS | Status: DC | PRN
  Administered 2023-06-28 – 2023-06-29 (×2): 1 mg via INTRAVENOUS
  Filled 2023-06-28 (×2): qty 1

## 2023-06-28 MED ORDER — ACETAMINOPHEN 650 MG RE SUPP: 650.0000 mg | Freq: Four times a day (QID) | RECTAL | Status: DC | PRN

## 2023-06-28 MED ORDER — PROMETHAZINE HCL 25 MG PO TABS
12.5000 mg | ORAL_TABLET | Freq: Four times a day (QID) | ORAL | Status: DC | PRN
  Administered 2023-06-29 – 2023-07-01 (×6): 12.5 mg via ORAL
  Filled 2023-06-28 (×6): qty 1

## 2023-06-28 MED ORDER — DICYCLOMINE HCL 20 MG PO TABS
20.0000 mg | ORAL_TABLET | Freq: Two times a day (BID) | ORAL | Status: DC
  Administered 2023-06-28 – 2023-07-01 (×7): 20 mg via ORAL
  Filled 2023-06-28 (×7): qty 1

## 2023-06-28 MED ORDER — UMECLIDINIUM BROMIDE 62.5 MCG/ACT IN AEPB
1.0000 | INHALATION_SPRAY | Freq: Every day | RESPIRATORY_TRACT | Status: DC
  Administered 2023-06-29 – 2023-07-01 (×3): 1 via RESPIRATORY_TRACT
  Filled 2023-06-28: qty 7

## 2023-06-28 MED ORDER — PRAMIPEXOLE DIHYDROCHLORIDE 0.25 MG PO TABS
0.5000 mg | ORAL_TABLET | Freq: Every day | ORAL | Status: DC
  Administered 2023-06-28 – 2023-06-30 (×3): 0.5 mg via ORAL
  Filled 2023-06-28 (×5): qty 2

## 2023-06-28 MED ORDER — OXYCODONE HCL 5 MG PO TABS
5.0000 mg | ORAL_TABLET | ORAL | Status: DC | PRN
  Administered 2023-06-28 – 2023-06-30 (×10): 5 mg via ORAL
  Filled 2023-06-28 (×11): qty 1

## 2023-06-28 MED ORDER — NYSTATIN 100000 UNIT/GM EX POWD
Freq: Three times a day (TID) | CUTANEOUS | Status: DC
  Filled 2023-06-28: qty 15

## 2023-06-28 MED ORDER — ENOXAPARIN SODIUM 40 MG/0.4ML IJ SOSY
40.0000 mg | PREFILLED_SYRINGE | INTRAMUSCULAR | Status: DC
  Administered 2023-06-29 – 2023-06-30 (×2): 40 mg via SUBCUTANEOUS
  Filled 2023-06-28 (×3): qty 0.4

## 2023-06-28 MED ORDER — POTASSIUM CHLORIDE CRYS ER 20 MEQ PO TBCR
40.0000 meq | EXTENDED_RELEASE_TABLET | Freq: Once | ORAL | Status: AC
  Administered 2023-06-28: 40 meq via ORAL
  Filled 2023-06-28: qty 2

## 2023-06-28 MED ORDER — ACETAMINOPHEN 325 MG PO TABS: 650.0000 mg | ORAL_TABLET | Freq: Four times a day (QID) | ORAL | Status: DC | PRN

## 2023-06-28 NOTE — ED Notes (Signed)
 Report called to Christa, RN on inpatient unit.

## 2023-06-28 NOTE — ED Notes (Signed)
 Called [facility] for transport, pt bed assignment is ready

## 2023-06-28 NOTE — Plan of Care (Signed)
  Problem: Education: Goal: Knowledge of General Education information will improve Description: Including pain rating scale, medication(s)/side effects and non-pharmacologic comfort measures Outcome: Progressing   Problem: Clinical Measurements: Goal: Ability to maintain clinical measurements within normal limits will improve Outcome: Progressing   Problem: Clinical Measurements: Goal: Will remain free from infection Outcome: Progressing   Problem: Nutrition: Goal: Adequate nutrition will be maintained Outcome: Progressing   Problem: Elimination: Goal: Will not experience complications related to bowel motility Outcome: Progressing   

## 2023-06-28 NOTE — ED Notes (Signed)
 Pt taken to CT.

## 2023-06-28 NOTE — H&P (Signed)
 History and Physical  Becky [name] NWG:[id number] DOB: Mar 11, 1950 DOA: 06/27/2023  PCP: Mordechai April, DO   Chief Complaint: Abdominal pain  HPI: Becky [name] is a 73 y.o. female with medical history significant for hypertension on HCTZ, CVA, TIA, asthma, GERD hypothyroidism, CKD stage III noted to the emergency department complaining of generalized abdominal pain and constipation found to have hyponatremia.  Patient tells me that she lives alone, she has not had a bowel movement for the last week or so, which is not typical for her.  States that she has some chronic low back pain which is bothering her currently, and for which reason she was recently started on pain medication by her PCP sometime in the last week.  CT scan as noted below showed no acute findings, however she was found to have hyponatremia.  She was given 500 cc of normal saline with no other intervention overnight admitted to the hospitalist service at [facility] this morning.  Of note, she was seen in the ER 4/11 with similar complaints of feeling constipated with nausea without vomiting.  CT scan done on that date also showed no acute process in the abdomen or pelvis.  Currently, the patient is resting comfortably, but states that she has back pain.  Review of Systems: Please see HPI for pertinent positives and negatives. A complete 10 system review of systems are otherwise negative.  Past Medical History:  Diagnosis Date   Chronic kidney disease    CVA (cerebral vascular accident) (HCC) 11/14/2013   Depression    followed by Dr Trilby Fujisawa frequently 10/01/2015   Genital herpes    History of melanoma    followed by derm Dr Joanne Muckle   Hyperlipidemia    Hypertension    MCI (mild cognitive impairment) 10/01/2015   Pure hypercholesterolemia 11/14/2013   Stroke [facility])    Old stroke seen on MRI 12/2009, carotid doppler w minimal plaque, MRI off the head otherwise neg for acute disease.   Thyroid  disease     hypothyroid. Followed by Dr Deborra Falter   TIA (transient ischemic attack) 8/08   followed by neurologist Dr Janett Medin   Past Surgical History:  Procedure Laterality Date   ABDOMINAL HYSTERECTOMY     APPENDECTOMY     CHOLECYSTECTOMY     MELANOMA EXCISION     x2   Social History:  reports that she has never smoked. She has never used smokeless tobacco. She reports that she does not drink alcohol and does not use drugs.  Allergies  Allergen Reactions   Penicillins Anaphylaxis   Telmisartan     Suspected cause of angioedema    Zofran  [Ondansetron ]    Iodinated Contrast Media Other (See Comments)   Iohexol  Other (See Comments)     Desc: iodine    Latex Rash   Nsaids Other (See Comments)    CKD     Family History  Problem Relation Age of Onset   Breast cancer Mother    Hyperlipidemia Father    Hypertension Father    Heart attack Father    Hypertension Sister    Hyperlipidemia Sister    Dementia Maternal Aunt      Prior to Admission medications   Medication Sig Start Date End Date Taking? Authorizing Provider  acyclovir (ZOVIRAX) 200 MG capsule as needed. 09/05/14   [provider]  albuterol  (VENTOLIN  HFA) 108 (90 Base) MCG/ACT inhaler SMARTSIG:1 Puff(s) Via Inhaler Every 4 Hours PRN 05/14/23   [provider]  ALPRAZolam  (XANAX ) 0.25 MG tablet Take 0.25 mg by mouth 3 (three) times daily as needed for anxiety or sleep. 08/10/15   [provider]  ARIPiprazole  (ABILIFY ) 5 MG tablet Take 5 mg by mouth at bedtime. 05/14/23   [provider]  aspirin  EC 325 MG tablet Take 650 mg by mouth at bedtime.    [provider]  azithromycin  (ZITHROMAX  Z-PAK) 250 MG tablet Take 2 tablets day 1 and then 1 daily for 4 days 06/17/23   Margaretann Sharper, MD  desvenlafaxine  (PRISTIQ ) 100 MG 24 hr tablet Take 100 mg by mouth daily. 09/13/19   [provider]  Dexmethylphenidate  HCl 40 MG CP24 Take 1 capsule by mouth every morning. 05/20/23   [provider]  dicyclomine  (BENTYL ) 20 MG tablet Take 1 tablet (20 mg total) by mouth 2 (two) times daily. 06/12/23   Lyna Sandhoff, PA-C  hydrochlorothiazide  (HYDRODIURIL ) 25 MG tablet Take 25 mg by mouth every morning. 08/01/21   [provider]  levothyroxine  (SYNTHROID ) 50 MCG tablet Take 50 mcg by mouth daily before breakfast.    [provider]  nadolol  (CORGARD ) 20 MG tablet Take 1 tablet (20 mg total) by mouth daily. Patient needs appt for # 90 supply or future refills. Please call office to schedule appt. 07/16/22   Camnitz, Babetta Lesch, MD  NONFORMULARY OR COMPOUNDED ITEM Place 1-4 drops into the right eye See admin instructions. Prednisolone  1% / Moxifloxacin  0.5% / Bromfenac 0.075% Opth Soln.  Instill 1 drop into the right eye four times daily for 4 days, then 1 drop twice a day for 2 days, then 1 drop once a day until finished.    [provider]  OVER THE COUNTER MEDICATION Take 2 tablets by mouth daily as needed. Equate indigestion relief.    [provider]  oxyCODONE -acetaminophen  (PERCOCET/ROXICET) 5-325 MG tablet Take 1 tablet by mouth every 6 (six) hours as needed for up to 5 days for severe pain (pain score 7-10). 06/23/23 06/28/23  Sonnie Dusky, PA-C  pramipexole  (MIRAPEX ) 0.25 MG tablet Take 0.5 mg by mouth at bedtime. 12/26/15   [provider]  pravastatin  (PRAVACHOL ) 80 MG tablet Take 80 mg by mouth daily. 08/01/21   [provider]  predniSONE  (DELTASONE ) 20 MG tablet Take 1 tablet (20 mg total) by mouth daily with breakfast. 06/07/23   Margaretann Sharper, MD  predniSONE  (DELTASONE ) 20 MG tablet Take 1 tablet (20 mg total) by mouth daily with breakfast. 06/17/23   Margaretann Sharper, MD  promethazine  (PHENERGAN ) 25 MG tablet Take 1 tablet (25 mg total) by mouth every 6 (six) hours as needed for nausea or vomiting. 06/12/23   Lyna Sandhoff, PA-C  Tiotropium Bromide Monohydrate  (SPIRIVA  RESPIMAT) 1.25 MCG/ACT AERS Inhale 1 puff into  the lungs daily. 05/19/23   Quillian Brunt, MD  traZODone  (DESYREL ) 50 MG tablet Take 50-150 mg by mouth at bedtime as needed. 03/20/23   [provider]    Physical Exam: BP (!) 174/81   Pulse 70   Temp 98.4 F (36.9 C) (Oral)   Resp 11   SpO2 97%  General:  Alert, oriented, calm, in no acute distress, looks somewhat drowsy Cardiovascular: RRR, no murmurs or rubs, no peripheral edema  Respiratory: clear to auscultation bilaterally, no wheezes, no crackles  Abdomen: soft, nontender, slightly distended, normal bowel tones heard  Skin: dry, no rashes  Musculoskeletal: no joint effusions, normal range of motion  Psychiatric: appropriate affect, normal speech  Neurologic: extraocular muscles intact, clear speech, moving all extremities with intact sensorium         Labs on Admission:  Basic Metabolic Panel: Recent Labs  Lab 06/22/23 1920 06/27/23 1828 06/28/23 0731  NA 139 124* 124*  K 3.0* 3.2* 2.9*  CL 99 88* 86*  CO2 30 24 26   GLUCOSE 104* 116* 91  BUN 18 13 11   CREATININE 1.08* 0.90 0.73  CALCIUM 8.9 9.1 9.0   Liver Function Tests: Recent Labs  Lab 06/22/23 1920 06/27/23 1828  AST 14* 18  ALT 20 19  ALKPHOS 60 77  BILITOT 0.6 0.3  PROT 6.5 6.4*  ALBUMIN 3.4* 4.1   Recent Labs  Lab 06/22/23 1920 06/27/23 1828  LIPASE 22 14   Recent Labs  Lab 06/28/23 0335  AMMONIA 19   CBC: Recent Labs  Lab 06/22/23 1920 06/27/23 1828  WBC 11.9* 9.2  NEUTROABS 8.7*  --   HGB 11.0* 12.8  HCT 34.3* 36.5  MCV 92.7 84.9  PLT 305 380   Cardiac Enzymes: No results for input(s): "CKTOTAL", "CKMB", "CKMBINDEX", "TROPONINI" in the last 168 hours. BNP (last 3 results) No results for input(s): "BNP" in the last 8760 hours.  ProBNP (last 3 results) Recent Labs    05/13/23 1602  PROBNP 545*    CBG: Recent Labs  Lab 06/28/23 0215  GLUCAP 112*    Radiological Exams on Admission: CT Head Wo Contrast Result Date: 06/28/2023 CLINICAL DATA:  Initial  evaluation for acute delirium. EXAM: CT HEAD WITHOUT CONTRAST TECHNIQUE: Contiguous axial images were obtained from the base of the skull through the vertex without intravenous contrast. RADIATION DOSE REDUCTION: This exam was performed according to the departmental dose-optimization program which includes automated exposure control, adjustment of the mA and/or kV according to patient size and/or use of iterative reconstruction technique. COMPARISON:  Prior study from 02/01/2023 FINDINGS: Brain: Cerebral volume within normal limits. Patchy hypodensity involving the supratentorial cerebral white matter, consistent chronic small vessel ischemic disease, moderately advanced in nature. Encephalomalacia involving the anterior left frontal lobe noted. No acute intracranial hemorrhage. No acute large vessel territory infarct. No mass lesion or midline shift. No hydrocephalus or extra-axial fluid collection. Vascular: No abnormal hyperdense vessel. Calcified atherosclerosis present at the skull base. Skull: Scalp soft tissues demonstrate no acute finding. Calvarium intact. Sinuses/Orbits: Globes orbital soft tissues within normal limits. Paranasal sinuses and mastoid air cells are clear. Other: None. IMPRESSION: 1. No acute intracranial abnormality. 2. Moderately advanced chronic microvascular ischemic disease with chronic left frontal encephalomalacia. Electronically Signed   By: Virgia Griffins M.D.   On: 06/28/2023 02:44   CT ABDOMEN PELVIS WO CONTRAST Result Date: 06/27/2023 CLINICAL DATA:  Bowel obstruction suspected, abdominal pain EXAM: CT ABDOMEN AND PELVIS WITHOUT CONTRAST TECHNIQUE: Multidetector CT imaging of the abdomen and pelvis was performed following the standard protocol without IV contrast. Unenhanced CT was performed per clinician order. Lack of IV contrast limits sensitivity and specificity, especially for evaluation of abdominal/pelvic solid viscera. RADIATION DOSE REDUCTION: This exam was  performed according to the departmental dose-optimization program which includes automated exposure control, adjustment of the mA and/or kV according to patient size and/or use of iterative reconstruction technique. COMPARISON:  06/12/2023 FINDINGS: Lower chest: No acute pleural or parenchymal lung disease. Hepatobiliary: Cholecystectomy. Unremarkable unenhanced appearance of the liver. No biliary duct dilation. Pancreas: Unremarkable unenhanced appearance. Spleen: Unremarkable unenhanced appearance. Adrenals/Urinary Tract: No urinary tract calculi or obstructive uropathy within either kidney. The adrenals are stable. Bladder is unremarkable.  Stomach/Bowel: No bowel obstruction or ileus. The appendix is surgically absent. Scattered diverticulosis of the descending and sigmoid colon without evidence of acute diverticulitis. No bowel wall thickening or inflammatory change. Small hiatal hernia. Vascular/Lymphatic: Aortic atherosclerosis. No enlarged abdominal or pelvic lymph nodes. Reproductive: Status post hysterectomy. No adnexal masses. Other: No free fluid or free intraperitoneal gas. No abdominal wall hernia. Musculoskeletal: No acute or destructive bony abnormalities. Chronic compression deformity inferior endplate L1 vertebral body unchanged. Reconstructed images demonstrate no additional findings. IMPRESSION: 1. No acute intra-abdominal or intrapelvic process. 2. Distal colonic diverticulosis without diverticulitis. 3.  Aortic Atherosclerosis (ICD10-I70.0). Electronically Signed   By: Bobbye Burrow M.D.   On: 06/27/2023 20:47   Assessment/Plan [place] Janes is a 73 y.o. female with medical history significant for hypertension on HCTZ, CVA, TIA, asthma, GERD hypothyroidism, CKD stage III noted to the emergency department complaining of generalized abdominal pain and constipation found to have hyponatremia.   Hyponatremia-I suspect this is multifactorial, urine sodium and urine osmolality consistent  with SIADH.  She also looks somewhat dry on exam as she has not been feeling well and has not been eating or drinking very much over the last 5 to 7 days.  She is also taking hydrochlorothiazide  which can cause hyponatremia.  She is a little bit drowsy, does not appear confused to me.  Although in the ER she was noted to be asking questions somewhat repeatedly.  She may be drowsy due to narcotics received. -Observation admission -Gentle hydration with normal saline -1200 cc fluid restriction -Discontinue hydrochlorothiazide  another potential causative agents -Monitor sodium with every 6 hours BMP  Hypokalemia-likely nutritional deficit -Replete with oral potassium x 1 now -Check magnesium  level now -Will follow potassium level with morning labs  CKD stage III-patient currently with normal GFR  Hypothyroidism-Synthroid   History of CVA-continue daily aspirin   DVT prophylaxis: Lovenox      Code Status: Full Code  Consults called: None  Admission status: Observation  Time spent: 59 minutes  Gregoire Bennis Rickey Charm MD [facility] Pager [phone]  If 7PM-7AM, please contact night-coverage [email or web address] Password [facility]  06/28/2023, 9:56 AM

## 2023-06-28 NOTE — ED Provider Notes (Signed)
 I was asked to evaluate the patient for confusion. Patient presented with abdominal pain and was found to be hyponatremic.  She is afebrile.  Glucose is appropriate. On my exam she is awake and alert, but does appear to be confused.  She has had repetitive questioning.  CT head was performed which shows chronic encephalomalacia Patient has also recently been on narcotics.  Her delirium could be back multifactorial given the recent attics, underlying encephalomalacia as well as the new hyponatremia.  She has only been given around 500 cc of normal saline. Will add on ammonia level for encephalopathy She is awaiting admission   Eldon Greenland, MD 06/28/23 [id number]

## 2023-06-29 ENCOUNTER — Inpatient Hospital Stay (HOSPITAL_COMMUNITY)

## 2023-06-29 DIAGNOSIS — E876 Hypokalemia: Secondary | ICD-10-CM | POA: Diagnosis not present

## 2023-06-29 DIAGNOSIS — I10 Essential (primary) hypertension: Secondary | ICD-10-CM | POA: Diagnosis not present

## 2023-06-29 DIAGNOSIS — E871 Hypo-osmolality and hyponatremia: Secondary | ICD-10-CM | POA: Diagnosis not present

## 2023-06-29 LAB — BASIC METABOLIC PANEL WITH GFR
Anion gap: 11 (ref 5–15)
Anion gap: 10 (ref 5–15)
Anion gap: 11 (ref 5–15)
Anion gap: 6 (ref 5–15)
BUN: 13 mg/dL (ref 8–23)
BUN: 13 mg/dL (ref 8–23)
BUN: 11 mg/dL (ref 8–23)
BUN: 16 mg/dL (ref 8–23)
CO2: 26 mmol/L (ref 22–32)
CO2: 25 mmol/L (ref 22–32)
CO2: 26 mmol/L (ref 22–32)
CO2: 29 mmol/L (ref 22–32)
Calcium: 8.6 mg/dL — ABNORMAL LOW (ref 8.9–10.3)
Calcium: 8.7 mg/dL — ABNORMAL LOW (ref 8.9–10.3)
Calcium: 8.9 mg/dL (ref 8.9–10.3)
Calcium: 9.5 mg/dL (ref 8.9–10.3)
Chloride: 89 mmol/L — ABNORMAL LOW (ref 98–111)
Chloride: 92 mmol/L — ABNORMAL LOW (ref 98–111)
Chloride: 92 mmol/L — ABNORMAL LOW (ref 98–111)
Chloride: 97 mmol/L — ABNORMAL LOW (ref 98–111)
Creatinine, Ser: 0.91 mg/dL (ref 0.44–1.00)
Creatinine, Ser: 0.94 mg/dL (ref 0.44–1.00)
Creatinine, Ser: 0.89 mg/dL (ref 0.44–1.00)
Creatinine, Ser: 1.01 mg/dL — ABNORMAL HIGH (ref 0.44–1.00)
GFR, Estimated: >60 mL/min (ref >60)
GFR, Estimated: >60 mL/min (ref >60)
GFR, Estimated: >60 mL/min (ref >60)
GFR, Estimated: 59 mL/min — ABNORMAL LOW (ref >60)
Glucose, Bld: 92 mg/dL (ref 70–99)
Glucose, Bld: 93 mg/dL (ref 70–99)
Glucose, Bld: 111 mg/dL — ABNORMAL HIGH (ref 70–99)
Glucose, Bld: 134 mg/dL — ABNORMAL HIGH (ref 70–99)
Potassium: 3 mmol/L — ABNORMAL LOW (ref 3.5–5.1)
Potassium: 3.1 mmol/L — ABNORMAL LOW (ref 3.5–5.1)
Potassium: 3.2 mmol/L — ABNORMAL LOW (ref 3.5–5.1)
Potassium: 3.5 mmol/L (ref 3.5–5.1)
Sodium: 126 mmol/L — ABNORMAL LOW (ref 135–145)
Sodium: 127 mmol/L — ABNORMAL LOW (ref 135–145)
Sodium: 129 mmol/L — ABNORMAL LOW (ref 135–145)
Sodium: 132 mmol/L — ABNORMAL LOW (ref 135–145)

## 2023-06-29 LAB — CBC
HCT: 40.2 % (ref 36.0–46.0)
Hemoglobin: 13.5 g/dL (ref 12.0–15.0)
MCH: 29.9 pg (ref 26.0–34.0)
MCHC: 33.6 g/dL (ref 30.0–36.0)
MCV: 89.1 fL (ref 80.0–100.0)
Platelets: 323 10*3/uL (ref 150–400)
RBC: 4.51 MIL/uL (ref 3.87–5.11)
RDW: 13.2 % (ref 11.5–15.5)
WBC: 11.5 10*3/uL — ABNORMAL HIGH (ref 4.0–10.5)
nRBC: 0 % (ref 0.0–0.2)

## 2023-06-29 MED ORDER — POTASSIUM CHLORIDE CRYS ER 20 MEQ PO TBCR
40.0000 meq | EXTENDED_RELEASE_TABLET | Freq: Two times a day (BID) | ORAL | Status: AC
  Administered 2023-06-29 (×2): 40 meq via ORAL
  Filled 2023-06-29 (×2): qty 2

## 2023-06-29 MED ORDER — LORAZEPAM 1 MG PO TABS: 1.0000 mg | ORAL_TABLET | Freq: Once | ORAL | Status: DC | PRN

## 2023-06-29 MED ORDER — MORPHINE SULFATE (PF) 2 MG/ML IV SOLN
2.0000 mg | INTRAVENOUS | Status: DC | PRN
  Administered 2023-06-30 – 2023-07-01 (×4): 2 mg via INTRAVENOUS
  Filled 2023-06-29 (×4): qty 1

## 2023-06-29 MED ORDER — MAGNESIUM OXIDE -MG SUPPLEMENT 400 (240 MG) MG PO TABS
400.0000 mg | ORAL_TABLET | Freq: Two times a day (BID) | ORAL | Status: AC
  Administered 2023-06-29 (×2): 400 mg via ORAL
  Filled 2023-06-29 (×2): qty 1

## 2023-06-29 MED ORDER — POLYETHYLENE GLYCOL 3350 17 G PO PACK: 17.0000 g | PACK | Freq: Two times a day (BID) | ORAL | Status: DC

## 2023-06-29 NOTE — Progress Notes (Signed)
 [facility] PROGRESS NOTE    Progress Note  Becky [name]  ZSW:[id number] DOB: 1951-01-19 DOA: 06/27/2023 PCP: Mordechai April, DO     Brief Narrative:   Becky [name] is an 73 y.o. female past medical history significant for essential hypertension, CVA TIA hypothyroidism chronic kidney disease stage IIIb presented with generalized abdominal pain constipation, was also found to be hyponatremic.  She relates she has not had a bowel movement in 7 days, she has some new lower back pain for which she was started taking oxycodone .  CT scan of the abdomen pelvis showed no acute findings but she was found to be hyponatremic.   Assessment/Plan:   Hypovolemic hyponatremia: Likely multifactorial the setting of decreased oral intake and diuretic use. On 06/22/2023 her sodium was 139 Urine osmolality is 471 serum osmolarity is 259, with a urine sodium 119.  She is on hydrochlorothiazide  at home, she is not on SSRI. Will start on gentle IV fluid hydration her sodium is improving slowly we will continue IV hydration and continue to monitor sodium closely.  Hypokalemia/hypomagnesemia: Replete orally recheck in the morning Replete magnesium  orally recheck in the morning. For acute potassium greater than 4, magnesium  greater than 2.  Chronic back pain: Straight leg raise negative bilaterally external rotation of the hip is negative. CT scan of the lumbar spine and thoracic spine show no acute fracture or dislocation does show an L1 inferior endplate fracture which is chronic and severe L4-L5 arthrosis with grade 1 anterolisthesis. Hepatic function panel is unremarkable  Chronic kidney disease stage IIIb: Regarding appears to be at baseline.  Hypothyroidism: Continue Synthroid .  History of CVA: Continue aspirin .    DVT prophylaxis: lovenox  Family Communication:none Status is: Inpatient Remains inpatient appropriate because: Hyponatremia    Code Status:     Code  Status Orders  (From admission, onward)           Start     Ordered   06/28/23 0956  Full code  Continuous       Question:  By:  Answer:  Consent: discussion documented in EHR   06/28/23 0956           Code Status History     Date Active Date Inactive Code Status Order ID Comments User Context   02/01/2023 0431 02/04/2023 1705 Full Code [id number]  Maryella Smothers Inpatient   08/18/2021 0549 08/20/2021 2045 Full Code [id number]  Davida Espy, MD Inpatient         IV Access:   Peripheral IV   Procedures and diagnostic studies:   CT Head Wo Contrast Result Date: 06/28/2023 CLINICAL DATA:  Initial evaluation for acute delirium. EXAM: CT HEAD WITHOUT CONTRAST TECHNIQUE: Contiguous axial images were obtained from the base of the skull through the vertex without intravenous contrast. RADIATION DOSE REDUCTION: This exam was performed according to the departmental dose-optimization program which includes automated exposure control, adjustment of the mA and/or kV according to patient size and/or use of iterative reconstruction technique. COMPARISON:  Prior study from 02/01/2023 FINDINGS: Brain: Cerebral volume within normal limits. Patchy hypodensity involving the supratentorial cerebral white matter, consistent chronic small vessel ischemic disease, moderately advanced in nature. Encephalomalacia involving the anterior left frontal lobe noted. No acute intracranial hemorrhage. No acute large vessel territory infarct. No mass lesion or midline shift. No hydrocephalus or extra-axial fluid collection. Vascular: No abnormal hyperdense vessel. Calcified atherosclerosis present at the skull base. Skull: Scalp soft tissues demonstrate no acute finding. Calvarium intact. Sinuses/Orbits: Globes  orbital soft tissues within normal limits. Paranasal sinuses and mastoid air cells are clear. Other: None. IMPRESSION: 1. No acute intracranial abnormality. 2. Moderately advanced chronic microvascular  ischemic disease with chronic left frontal encephalomalacia. Electronically Signed   By: Virgia Griffins M.D.   On: 06/28/2023 02:44   CT ABDOMEN PELVIS WO CONTRAST Result Date: 06/27/2023 CLINICAL DATA:  Bowel obstruction suspected, abdominal pain EXAM: CT ABDOMEN AND PELVIS WITHOUT CONTRAST TECHNIQUE: Multidetector CT imaging of the abdomen and pelvis was performed following the standard protocol without IV contrast. Unenhanced CT was performed per clinician order. Lack of IV contrast limits sensitivity and specificity, especially for evaluation of abdominal/pelvic solid viscera. RADIATION DOSE REDUCTION: This exam was performed according to the departmental dose-optimization program which includes automated exposure control, adjustment of the mA and/or kV according to patient size and/or use of iterative reconstruction technique. COMPARISON:  06/12/2023 FINDINGS: Lower chest: No acute pleural or parenchymal lung disease. Hepatobiliary: Cholecystectomy. Unremarkable unenhanced appearance of the liver. No biliary duct dilation. Pancreas: Unremarkable unenhanced appearance. Spleen: Unremarkable unenhanced appearance. Adrenals/Urinary Tract: No urinary tract calculi or obstructive uropathy within either kidney. The adrenals are stable. Bladder is unremarkable. Stomach/Bowel: No bowel obstruction or ileus. The appendix is surgically absent. Scattered diverticulosis of the descending and sigmoid colon without evidence of acute diverticulitis. No bowel wall thickening or inflammatory change. Small hiatal hernia. Vascular/Lymphatic: Aortic atherosclerosis. No enlarged abdominal or pelvic lymph nodes. Reproductive: Status post hysterectomy. No adnexal masses. Other: No free fluid or free intraperitoneal gas. No abdominal wall hernia. Musculoskeletal: No acute or destructive bony abnormalities. Chronic compression deformity inferior endplate L1 vertebral body unchanged. Reconstructed images demonstrate no  additional findings. IMPRESSION: 1. No acute intra-abdominal or intrapelvic process. 2. Distal colonic diverticulosis without diverticulitis. 3.  Aortic Atherosclerosis (ICD10-I70.0). Electronically Signed   By: Bobbye Burrow M.D.   On: 06/27/2023 20:47     Medical Consultants:   None.   Subjective:    Becky [name] continues to complain of back pain and abdominal pain  Objective:    Vitals:   06/28/23 1842 06/28/23 2326 06/29/23 0006 06/29/23 0626  BP: (!) 189/92 (!) 196/84 (!) 164/90 (!) 186/95  Pulse: 81 72 76 85  Resp: 20 18  18   Temp: 98 F (36.7 C) 97.9 F (36.6 C)  97.8 F (36.6 C)  TempSrc: Oral Oral  Oral  SpO2: 98% 96% 97% 95%   SpO2: 95 %   Intake/Output Summary (Last 24 hours) at 06/29/2023 0849 Last data filed at 06/29/2023 0651 Gross per 24 hour  Intake 555.93 ml  Output 1700 ml  Net -1144.07 ml   There were no vitals filed for this visit.  Exam: General exam: In no acute distress. Respiratory system: Good air movement and clear to auscultation. Cardiovascular system: S1 & S2 heard, RRR. No JVD, murmurs, rubs, gallops or clicks.  Gastrointestinal system: Abdomen is nondistended, soft and nontender.  Central nervous system: Alert and oriented. No focal neurological deficits. Extremities: No pedal edema. Skin: No rashes, lesions or ulcers Psychiatry: Judgement and insight appear normal. Mood & affect appropriate.    Data Reviewed:    Labs: Basic Metabolic Panel: Recent Labs  Lab 06/28/23 0731 06/28/23 1155 06/28/23 1710 06/28/23 1831 06/29/23 0038 06/29/23 0636  NA 124*  --  124* 123* 126* 127*  K 2.9*  --  3.1* 3.0* 3.0* 3.1*  CL 86*  --  87* 87* 89* 92*  CO2 26  --  27 28 26 25   GLUCOSE 91  --  105* 96 92 93  BUN 11  --  14 14 13 13   CREATININE 0.73  --  1.00 0.96 0.91 0.94  CALCIUM 9.0  --  9.0 8.7* 8.6* 8.7*  MG 1.7 1.6*  --   --   --   --    GFR CrCl cannot be calculated (Unknown ideal weight.). Liver Function  Tests: Recent Labs  Lab 06/22/23 1920 06/27/23 1828  AST 14* 18  ALT 20 19  ALKPHOS 60 77  BILITOT 0.6 0.3  PROT 6.5 6.4*  ALBUMIN 3.4* 4.1   Recent Labs  Lab 06/22/23 1920 06/27/23 1828  LIPASE 22 14   Recent Labs  Lab 06/28/23 0335  AMMONIA 19   Coagulation profile No results for input(s): "INR", "PROTIME" in the last 168 hours. COVID-19 Labs  No results for input(s): "DDIMER", "FERRITIN", "LDH", "CRP" in the last 72 hours.  No results found for: "SARSCOV2NAA"  CBC: Recent Labs  Lab 06/22/23 1920 06/27/23 1828 06/29/23 0636  WBC 11.9* 9.2 11.5*  NEUTROABS 8.7*  --   --   HGB 11.0* 12.8 13.5  HCT 34.3* 36.5 40.2  MCV 92.7 84.9 89.1  PLT 305 380 323   Cardiac Enzymes: No results for input(s): "CKTOTAL", "CKMB", "CKMBINDEX", "TROPONINI" in the last 168 hours. BNP (last 3 results) Recent Labs    05/13/23 1602  PROBNP 545*   CBG: Recent Labs  Lab 06/28/23 0215  GLUCAP 112*   D-Dimer: No results for input(s): "DDIMER" in the last 72 hours. Hgb A1c: No results for input(s): "HGBA1C" in the last 72 hours. Lipid Profile: No results for input(s): "CHOL", "HDL", "LDLCALC", "TRIG", "CHOLHDL", "LDLDIRECT" in the last 72 hours. Thyroid  function studies: Recent Labs    06/28/23 1710  TSH 0.515   Anemia work up: No results for input(s): "VITAMINB12", "FOLATE", "FERRITIN", "TIBC", "IRON", "RETICCTPCT" in the last 72 hours. Sepsis Labs: Recent Labs  Lab 06/22/23 1920 06/27/23 1828 06/29/23 0636  WBC 11.9* 9.2 11.5*   Microbiology Recent Results (from the past 240 hours)  Urine Culture     Status: Abnormal   Collection Time: 06/23/23  8:00 AM   Specimen: Urine, Clean Catch  Result Value Ref Range Status   Specimen Description   Final    URINE, CLEAN CATCH Performed at [facility], [address] [place], [place] [address]    Special Requests   Final    NONE Performed at [facility], [address] [place], [place] [address]    Culture (A)  Final    80,000 COLONIES/mL ENTEROCOCCUS FAECALIS 10,000 COLONIES/mL GROUP B STREP(S.AGALACTIAE)ISOLATED TESTING AGAINST S. AGALACTIAE NOT ROUTINELY PERFORMED DUE TO PREDICTABILITY OF AMP/PEN/VAN SUSCEPTIBILITY. Performed at [facility], [address] [place], [place] [address]    Report Status 06/25/2023 FINAL  Final   Organism ID, Bacteria ENTEROCOCCUS FAECALIS (A)  Final      Susceptibility   Enterococcus faecalis - MIC*    AMPICILLIN <=2 SENSITIVE Sensitive     NITROFURANTOIN <=16 SENSITIVE Sensitive     VANCOMYCIN 1 SENSITIVE Sensitive     * 80,000 COLONIES/mL ENTEROCOCCUS FAECALIS     Medications:    dicyclomine   20 mg Oral BID   enoxaparin  (LOVENOX ) injection  40 mg Subcutaneous Q24H   levothyroxine   50 mcg Oral Q0600   nystatin   Topical TID   pramipexole   0.5 mg Oral QHS   umeclidinium bromide  1 puff Inhalation Daily   Continuous Infusions:  LOS: 1 day   Macdonald Savoy  [facility]  06/29/2023, 8:49 AM

## 2023-06-29 NOTE — Plan of Care (Signed)

## 2023-06-29 NOTE — Progress Notes (Signed)
 Patient going down for MRI. Asking to take the electrolyte replacement meds after MRI and some food. Will medicate then.

## 2023-06-29 NOTE — Progress Notes (Signed)
   06/29/23 1320  TOC Brief Assessment  Insurance and Status Reviewed  Patient has primary care physician Yes  Home environment has been reviewed Apartment with family  Prior level of function: Independent  Prior/Current Home Services No current home services  Readmission risk has been reviewed Yes (15%)  Transition of care needs no transition of care needs at this time

## 2023-06-29 NOTE — Plan of Care (Signed)
  Problem: Pain Managment: Goal: General experience of comfort will improve and/or be controlled Outcome: Progressing   Problem: Safety: Goal: Ability to remain free from injury will improve Outcome: Progressing

## 2023-06-29 NOTE — Plan of Care (Signed)
   Problem: Nutrition: Goal: Adequate nutrition will be maintained Outcome: Progressing   Problem: Safety: Goal: Ability to remain free from injury will improve Outcome: Progressing   Problem: Skin Integrity: Goal: Risk for impaired skin integrity will decrease Outcome: Progressing

## 2023-06-30 ENCOUNTER — Encounter (HOSPITAL_BASED_OUTPATIENT_CLINIC_OR_DEPARTMENT_OTHER): Admitting: Physical Therapy

## 2023-06-30 DIAGNOSIS — E871 Hypo-osmolality and hyponatremia: Secondary | ICD-10-CM | POA: Diagnosis not present

## 2023-06-30 DIAGNOSIS — E876 Hypokalemia: Secondary | ICD-10-CM | POA: Diagnosis not present

## 2023-06-30 DIAGNOSIS — I10 Essential (primary) hypertension: Secondary | ICD-10-CM | POA: Diagnosis not present

## 2023-06-30 LAB — BASIC METABOLIC PANEL WITH GFR
Anion gap: 9 (ref 5–15)
BUN: 20 mg/dL (ref 8–23)
CO2: 25 mmol/L (ref 22–32)
Calcium: 9.1 mg/dL (ref 8.9–10.3)
Chloride: 97 mmol/L — ABNORMAL LOW (ref 98–111)
Creatinine, Ser: 1.09 mg/dL — ABNORMAL HIGH (ref 0.44–1.00)
GFR, Estimated: 54 mL/min — ABNORMAL LOW (ref >60)
Glucose, Bld: 106 mg/dL — ABNORMAL HIGH (ref 70–99)
Potassium: 4.1 mmol/L (ref 3.5–5.1)
Sodium: 131 mmol/L — ABNORMAL LOW (ref 135–145)

## 2023-06-30 LAB — MAGNESIUM: Magnesium: 2.2 mg/dL (ref 1.7–2.4)

## 2023-06-30 MED ORDER — POLYETHYLENE GLYCOL 3350 17 G PO PACK
17.0000 g | PACK | Freq: Two times a day (BID) | ORAL | Status: DC
  Administered 2023-06-30 – 2023-07-01 (×3): 17 g via ORAL
  Filled 2023-06-30 (×3): qty 1

## 2023-06-30 NOTE — Evaluation (Signed)
 Physical Therapy Evaluation Patient Details Name: Becky [name] MRN: [id number] DOB: 1951-01-26 Today's Date: 06/30/2023  History of Present Illness  Becky [name] is a 73 y.o. female admitted 06/27/23 with hyponatremia with medical history significant for hypertension on HCTZ, CVA, TIA, asthma, GERD hypothyroidism, CKD stage III noted to the emergency department complaining of generalized abdominal pain and constipation found to have hyponatremia.  Clinical Impression  Pt admitted with above diagnosis. Pt lethargic, but progresses witht he session. She is able to come to sitting EOB HHA CGA. Same assisatnce level required for STS and amb to the bathroom. She reports feeling nauseous while on the toilet, improves over the course of 5 mins. Chair moved to outside bathroom door due to lingering nausea and dec activity tolerance. Pt will likely progress to fulfill activity requirements for home safety and benefit from skilled PT at dc 3 times per week. Pt currently with functional limitations due to the deficits listed below (see PT Problem List). Pt will benefit from acute skilled PT to increase their independence and safety with mobility to allow discharge.           If plan is discharge home, recommend the following: A little help with walking and/or transfers;A little help with bathing/dressing/bathroom;Assistance with cooking/housework;Assist for transportation   Can travel by private vehicle        Equipment Recommendations Rolling walker (2 wheels)  Recommendations for Other Services       Functional Status Assessment Patient has had a recent decline in their functional status and demonstrates the ability to make significant improvements in function in a reasonable and predictable amount of time.     Precautions / Restrictions Precautions Precautions: Fall Recall of Precautions/Restrictions: Intact Restrictions Weight Bearing Restrictions Per Provider Order: No       Mobility  Bed Mobility Overal bed mobility: Needs Assistance Bed Mobility: Supine to Sit     Supine to sit: Contact guard, HOB elevated     General bed mobility comments: inc time, HHA    Transfers Overall transfer level: Needs assistance Equipment used: Rolling walker (2 wheels) Transfers: Sit to/from Stand Sit to Stand: Contact guard assist                Ambulation/Gait Ambulation/Gait assistance: Contact guard assist Gait Distance (Feet): 30 Feet Assistive device: Rolling walker (2 wheels) Gait Pattern/deviations: Step-to pattern, Wide base of support, Trunk flexed Gait velocity: dec     General Gait Details: trunk flexed, non reciporcal pattern and intermittent discontinuous steps, uses 2WW, good directional changes  Stairs            Wheelchair Mobility     Tilt Bed    Modified Rankin (Stroke Patients Only)       Balance Overall balance assessment: Needs assistance Sitting-balance support: No upper extremity supported, Feet supported Sitting balance-Leahy Scale: Fair     Standing balance support: During functional activity, Bilateral upper extremity supported, Reliant on assistive device for balance Standing balance-Leahy Scale: Poor                               Pertinent Vitals/Pain Pain Assessment Pain Assessment: No/denies pain    Home Living Family/patient expects to be discharged to:: Private residence Living Arrangements: Alone Available Help at Discharge: Available PRN/intermittently;Friend(s) Type of Home: House Home Access: Level entry       Home Layout: One level Home Equipment: Cane - single point  Prior Function Prior Level of Function : Independent/Modified Independent                     Extremity/Trunk Assessment        Lower Extremity Assessment Lower Extremity Assessment: Generalized weakness    Cervical / Trunk Assessment Cervical / Trunk Assessment: Kyphotic   Communication   Communication Communication: No apparent difficulties    Cognition Arousal: Lethargic Behavior During Therapy: WFL for tasks assessed/performed   PT - Cognitive impairments: No apparent impairments                         Following commands: Intact       Cueing Cueing Techniques: Verbal cues, Gestural cues, Tactile cues     General Comments      Exercises     Assessment/Plan    PT Assessment Patient needs continued PT services  PT Problem List Decreased strength;Decreased activity tolerance;Decreased mobility;Decreased balance;Decreased coordination       PT Treatment Interventions DME instruction;Functional mobility training;Balance training;Patient/family education;Gait training;Therapeutic activities;Therapeutic exercise    PT Goals (Current goals can be found in the Care Plan section)  Acute Rehab PT Goals Patient Stated Goal: improve activity tolerance and restore ind PT Goal Formulation: With patient Time For Goal Achievement: 07/14/23 Potential to Achieve Goals: Good    Frequency Min 3X/week     Co-evaluation               AM-PAC PT "6 Clicks" Mobility  Outcome Measure Help needed turning from your back to your side while in a flat bed without using bedrails?: A Little Help needed moving from lying on your back to sitting on the side of a flat bed without using bedrails?: A Little Help needed moving to and from a bed to a chair (including a wheelchair)?: A Little Help needed standing up from a chair using your arms (e.g., wheelchair or bedside chair)?: A Little Help needed to walk in hospital room?: A Little Help needed climbing 3-5 steps with a railing? : Total 6 Click Score: 16    End of Session Equipment Utilized During Treatment: Gait belt Activity Tolerance: Patient limited by lethargy Patient left: in chair;with call bell/phone within reach;with chair alarm set;with family/visitor present Nurse Communication:  Mobility status PT Visit Diagnosis: Difficulty in walking, not elsewhere classified (R26.2)    Time: 1610-9604 PT Time Calculation (min) (ACUTE ONLY): 40 min   Charges:   PT Evaluation $PT Eval Low Complexity: 1 Low PT Treatments $Gait Training: 8-22 mins $Therapeutic Activity: 8-22 mins PT General Charges $$ ACUTE PT VISIT: 1 Visit         Darien Eden PT Acute Rehabilitation Services Office: [phone] 06/30/2023   Serafin Dames 06/30/2023, 4:24 PM

## 2023-06-30 NOTE — Progress Notes (Addendum)
 [facility] PROGRESS NOTE    Progress Note  Becky [name]  ZOX:[id number] DOB: [birth year] DOA: 06/27/2023 PCP: Mordechai April, DO     Brief Narrative:   Becky [name] is an 73 y.o. female past medical history significant for essential hypertension, CVA TIA hypothyroidism chronic kidney disease stage IIIb presented with generalized abdominal pain constipation, was also found to be hyponatremic.  She relates she has not had a bowel movement in 7 days, she has some new lower back pain for which she was started taking oxycodone .  CT scan of the abdomen pelvis showed no acute findings but she was found to be hyponatremic.   Assessment/Plan:   Hypovolemic hyponatremia: Likely multifactorial the setting of decreased oral intake and diuretic use. On 06/22/2023 her sodium was 139 started on IV fluids her sodium this morning was 131. KVO IV fluid she is tolerating her diet. Can resume HCTZ as an outpatient. PT OT eval is pending.  Yesterday she could not work with physical therapy due to her pain. Will need skilled nursing facility placement.  Hypokalemia/hypomagnesemia: Replete orally, try to keep potassium greater than 4 magnesium  greater than 2.  Chronic back pain: Straight leg raise negative bilaterally, external rotation of the hip is negative. CT scan of the lumbar spine and thoracic spine show no acute fracture or dislocation does show an L1 inferior endplate fracture which is chronic and severe L4-L5 arthrosis with grade 1 anterolisthesis. MRI of the lumbar spine showed no acute findings, just some chronic inferior endplate compression fracture and L1 and multiple spondylosis and facet arthropathy.  No significant stenosis. Pain is controlled having bowel movements.  Chronic kidney disease stage IIIb: Regarding appears to be at baseline.  Hypothyroidism: Continue Synthroid .  Essential hypertension: Hold home regimen.  History of CVA: Continue  aspirin .    DVT prophylaxis: lovenox  Family Communication:none Status is: Inpatient Remains inpatient appropriate because: Hyponatremia    Code Status:     Code Status Orders  (From admission, onward)           Start     Ordered   06/28/23 0956  Full code  Continuous       Question:  By:  Answer:  Consent: discussion documented in EHR   06/28/23 0956           Code Status History     Date Active Date Inactive Code Status Order ID Comments User Context   02/01/2023 0431 02/04/2023 1705 Full Code [id number]  Maryella Smothers Inpatient   08/18/2021 0549 08/20/2021 2045 Full Code [id number]  Davida Espy, MD Inpatient         IV Access:   Peripheral IV   Procedures and diagnostic studies:   MR LUMBAR SPINE WO CONTRAST Result Date: 06/29/2023 CLINICAL DATA:  Lower back pain, chronic.  No recent injury. EXAM: MRI LUMBAR SPINE WITHOUT CONTRAST TECHNIQUE: Multiplanar, multisequence MR imaging of the lumbar spine was performed. No intravenous contrast was administered. COMPARISON:  CT of the thoracolumbar spine 06/22/2023. Abdominopelvic CT 06/27/2023. FINDINGS: Segmentation: Conventional anatomy assumed, with the last open disc space designated L5-S1.Concordant with prior imaging. Alignment:  Degenerative grade 1 anterolisthesis at L4-5. Vertebrae: No worrisome osseous lesion, acute fracture or pars defect. Unchanged chronic inferior endplate compression fracture at L1 with approximately 25% loss of vertebral body height. No associated marrow edema lower osseous retropulsion. Multilevel facet arthropathy. Chronic sacroiliac degenerative changes bilaterally. Conus medullaris: Extends to the L1-2 level. The conus and cauda equina appear normal. Paraspinal  and other soft tissues: No significant paraspinal findings. Unchanged mild extrahepatic biliary dilatation, within physiologic limits post cholecystectomy. Disc levels: Sagittal images demonstrate spondylosis at T10-11 and  T11-12 with loss of disc height and disc bulging. No associated cord deformity. There is some foraminal narrowing at T10-11, greater on the left. T12-L1: Normal interspace. L1-2: Chronic inferior endplate compression deformity at L1. Chronic disc bulging with mild bilateral facet hypertrophy. No significant spinal stenosis or foraminal narrowing. L2-3: Preserved disc height with mild disc bulging and mild to moderate facet and ligamentous hypertrophy, greater on the right. Mild asymmetric right lateral recess and right foraminal narrowing without nerve root encroachment. No central stenosis. L3-4: Mild loss of disc height with annular disc bulging eccentric to the right. Moderate facet and ligamentous hypertrophy. No significant central spinal stenosis. There is mild narrowing of the right lateral recess and both foramina without definite nerve root encroachment. L4-5: Advanced bilateral facet hypertrophy accounting for the grade 1 anterolisthesis. Resulting disc bulging and uncovering with preserved disc height. No significant central spinal stenosis. Mild foraminal narrowing bilaterally. L5-S1: Chronic disc bulging eccentric to the left with moderate bilateral facet hypertrophy. No spinal stenosis or significant foraminal narrowing. IMPRESSION: 1. No acute findings or clear explanation for the patient's symptoms in the lumbar spine. 2. Chronic inferior endplate compression deformity at L1. No acute osseous findings. 3. Multilevel spondylosis with disc bulging and facet arthropathy as described. No significant central spinal stenosis or definite nerve root encroachment in the lumbar spine. Mild foraminal narrowing bilaterally at L4-5. 4. Sagittal images demonstrate spondylosis at T10-11 and T11-12 with foraminal narrowing at T10-11, greater on the left. Consider further evaluation with dedicated thoracic MRI. Electronically Signed   By: Elmon Hagedorn M.D.   On: 06/29/2023 15:43     Medical Consultants:    None.   Subjective:    Becky [name] back pain with nominal pain improved she is tolerating her diet this morning.  Objective:    Vitals:   06/29/23 0006 06/29/23 0626 06/29/23 2109 06/30/23 0508  BP: (!) 164/90 (!) 186/95 132/61 (!) 157/84  Pulse: 76 85 80 69  Resp:  18 17 18   Temp:  97.8 F (36.6 C) 98.6 F (37 C) 98.1 F (36.7 C)  TempSrc:  Oral Oral Oral  SpO2: 97% 95% 94% 95%   SpO2: 95 %   Intake/Output Summary (Last 24 hours) at 06/30/2023 1610 Last data filed at 06/30/2023 0853 Gross per 24 hour  Intake 480 ml  Output 1300 ml  Net -820 ml   There were no vitals filed for this visit.  Exam: General exam: In no acute distress. Respiratory system: Good air movement and clear to auscultation. Cardiovascular system: S1 & S2 heard, RRR. No JVD. Gastrointestinal system: Abdomen is nondistended, soft and nontender.  Extremities: No pedal edema. Skin: No rashes, lesions or ulcers Psychiatry: Judgement and insight appear normal. Mood & affect appropriate.  Data Reviewed:    Labs: Basic Metabolic Panel: Recent Labs  Lab 06/28/23 0731 06/28/23 1155 06/28/23 1710 06/29/23 0038 06/29/23 0636 06/29/23 1241 06/29/23 1920 06/30/23 0502  NA 124*  --    < > 126* 127* 129* 132* 131*  K 2.9*  --    < > 3.0* 3.1* 3.2* 3.5 4.1  CL 86*  --    < > 89* 92* 92* 97* 97*  CO2 26  --    < > 26 25 26 29 25   GLUCOSE 91  --    < >  92 93 111* 134* 106*  BUN 11  --    < > 13 13 11 16 20   CREATININE 0.73  --    < > 0.91 0.94 0.89 1.01* 1.09*  CALCIUM 9.0  --    < > 8.6* 8.7* 8.9 9.5 9.1  MG 1.7 1.6*  --   --   --   --   --  2.2   < > = values in this interval not displayed.   GFR CrCl cannot be calculated (Unknown ideal weight.). Liver Function Tests: Recent Labs  Lab 06/27/23 1828  AST 18  ALT 19  ALKPHOS 77  BILITOT 0.3  PROT 6.4*  ALBUMIN 4.1   Recent Labs  Lab 06/27/23 1828  LIPASE 14   Recent Labs  Lab 06/28/23 0335  AMMONIA 19    Coagulation profile No results for input(s): "INR", "PROTIME" in the last 168 hours. COVID-19 Labs  No results for input(s): "DDIMER", "FERRITIN", "LDH", "CRP" in the last 72 hours.  No results found for: "SARSCOV2NAA"  CBC: Recent Labs  Lab 06/27/23 1828 06/29/23 0636  WBC 9.2 11.5*  HGB 12.8 13.5  HCT 36.5 40.2  MCV 84.9 89.1  PLT 380 323   Cardiac Enzymes: No results for input(s): "CKTOTAL", "CKMB", "CKMBINDEX", "TROPONINI" in the last 168 hours. BNP (last 3 results) Recent Labs    05/13/23 1602  PROBNP 545*   CBG: Recent Labs  Lab 06/28/23 0215  GLUCAP 112*   D-Dimer: No results for input(s): "DDIMER" in the last 72 hours. Hgb A1c: No results for input(s): "HGBA1C" in the last 72 hours. Lipid Profile: No results for input(s): "CHOL", "HDL", "LDLCALC", "TRIG", "CHOLHDL", "LDLDIRECT" in the last 72 hours. Thyroid  function studies: Recent Labs    06/28/23 1710  TSH 0.515   Anemia work up: No results for input(s): "VITAMINB12", "FOLATE", "FERRITIN", "TIBC", "IRON", "RETICCTPCT" in the last 72 hours. Sepsis Labs: Recent Labs  Lab 06/27/23 1828 06/29/23 0636  WBC 9.2 11.5*   Microbiology Recent Results (from the past 240 hours)  Urine Culture     Status: Abnormal   Collection Time: 06/23/23  8:00 AM   Specimen: Urine, Clean Catch  Result Value Ref Range Status   Specimen Description   Final    URINE, CLEAN CATCH Performed at [facility], [address] [place], [place] [address]    Special Requests   Final    NONE Performed at [facility], [address], [place], [place] [address]    Culture (A)  Final    80,000 COLONIES/mL ENTEROCOCCUS FAECALIS 10,000 COLONIES/mL GROUP B STREP(S.AGALACTIAE)ISOLATED TESTING AGAINST S. AGALACTIAE NOT ROUTINELY PERFORMED DUE TO PREDICTABILITY OF AMP/PEN/VAN SUSCEPTIBILITY. Performed at [facility], [address] [place], [place] [address]    Report Status  06/25/2023 FINAL  Final   Organism ID, Bacteria ENTEROCOCCUS FAECALIS (A)  Final      Susceptibility   Enterococcus faecalis - MIC*    AMPICILLIN <=2 SENSITIVE Sensitive     NITROFURANTOIN <=16 SENSITIVE Sensitive     VANCOMYCIN 1 SENSITIVE Sensitive     * 80,000 COLONIES/mL ENTEROCOCCUS FAECALIS     Medications:    dicyclomine   20 mg Oral BID   enoxaparin  (LOVENOX ) injection  40 mg Subcutaneous Q24H   levothyroxine   50 mcg Oral Q0600   nystatin   Topical TID   polyethylene glycol  17 g Oral BID   pramipexole   0.5 mg Oral QHS   umeclidinium bromide  1 puff Inhalation Daily   Continuous Infusions:    LOS: 2 days   Macdonald Savoy  [facility]  06/30/2023, 9:21 AM

## 2023-07-01 ENCOUNTER — Inpatient Hospital Stay (HOSPITAL_COMMUNITY)

## 2023-07-01 DIAGNOSIS — E871 Hypo-osmolality and hyponatremia: Secondary | ICD-10-CM | POA: Diagnosis not present

## 2023-07-01 LAB — BASIC METABOLIC PANEL WITH GFR
Anion gap: 8 (ref 5–15)
BUN: 21 mg/dL (ref 8–23)
CO2: 27 mmol/L (ref 22–32)
Calcium: 9.9 mg/dL (ref 8.9–10.3)
Chloride: 99 mmol/L (ref 98–111)
Creatinine, Ser: 1.23 mg/dL — ABNORMAL HIGH (ref 0.44–1.00)
GFR, Estimated: 47 mL/min — ABNORMAL LOW (ref >60)
Glucose, Bld: 98 mg/dL (ref 70–99)
Potassium: 4 mmol/L (ref 3.5–5.1)
Sodium: 134 mmol/L — ABNORMAL LOW (ref 135–145)

## 2023-07-01 MED ORDER — DESVENLAFAXINE SUCCINATE ER 25 MG PO TB24: 25.0000 mg | ORAL_TABLET | Freq: Every morning | ORAL | 0 refills | Status: AC

## 2023-07-01 MED ORDER — PROCHLORPERAZINE MALEATE 5 MG PO TABS: 5.0000 mg | ORAL_TABLET | Freq: Four times a day (QID) | ORAL | 0 refills | Status: AC | PRN

## 2023-07-01 NOTE — TOC Transition Note (Signed)
 Transition of Care [facility]) - Discharge Note   Patient Details  Name: Becky [name] MRN: [id number] Date of Birth: November 18, 1950  Transition of Care [facility] CM/SW Contact:  Jonni Nettle, LCSW Phone Number: 07/01/2023, 2:35 PM   Clinical Narrative:    CSW met with pt at bedside to discuss discharge plans. CSW advised pt of PT/OT recommendations for Home Health services. Pt and cousin, [name], agreeable with [facility] PT/OT services. Pt did not report any preference for [facility] agency. CSW sent referral to [facility] for [facility] PT/OT services. Per Shelvy Dickens with [facility], pt approved for services. CSW ordered RW through [employer]. [employer] to deliver RW to pt's room prior to discharge today. No further TOC needs at this time.   Patient Goals and CMS Choice  To return home with [facility] PT/OT        Discharge Placement  Home   Discharge Plan and Services Additional resources added to the After Visit Summary for [facility] PT/OT with [facility] and DME with [employer]      Social Drivers of Health (SDOH) Interventions SDOH Screenings   Food Insecurity: No Food Insecurity (06/28/2023)  Housing: Low Risk  (06/28/2023)  Transportation Needs: No Transportation Needs (06/28/2023)  Utilities: Not At Risk (06/28/2023)  Social Connections: Unknown (06/28/2023)  Tobacco Use: Low Risk  (06/27/2023)     Readmission Risk Interventions    06/29/2023    1:16 PM  Readmission Risk Prevention Plan  Transportation Screening Complete  PCP or Specialist Appt within 5-7 Days Complete  Home Care Screening Complete  Medication Review (RN CM) Complete   [place], LCSW 07/01/2023 2:40 PM

## 2023-07-01 NOTE — Discharge Summary (Signed)
 Physician Discharge Summary   Patient: Becky [name] MRN: [id number] DOB: 01-23-1951  Admit date:     06/27/2023  Discharge date: 07/01/23  Discharge Physician: Ephriam Hashimoto   PCP: Mordechai April, DO     Recommendations at discharge:  Follow up with PCP Dr. Donalynn Fry in 1 week for hyponatremia, low back pain Dr. Donalynn Fry: Please check BMP in 1 week (discharge Na 134, Cr 1.2) Please resume prior dose desvenlafaxine  if appropriate Please resume hydrochlorothiazide  or substitute alternative as appropriate Please re-evaluate low back pain and response to PT     Discharge Diagnoses: Principal Problem:   Hyponatremia Active Problems:   Essential hypertension, benign   Hypokalemia   Low back pain    Class III obesity   CKD IIIb ruled out   Cerebrovascular disease   Hypothyroidism     Hospital Course: 73 y.o. F with MO, HTN, CVA, hypothyroidism who presented with 1 week abdominal discomfort and constipation.  Initially presented 4/21 with low back pain.  CT lumbar spine showed no acute findings.  Na 139 and K 3.  Given oxycodone , potassium and discharge instructions to follow up with Orthopedics.  Went home, couldn't have a bowel movement, developed abdominal pain and returned.  In the ER, CT abdomen and pelvis showed no acute intra-abdominal process.  Incidentally noted to have sodium 126, and admitted for hyponatremia.    Hyponatremia Long term desvenlafaxine , Abilify  and hydrochlorothiazide .  Only new medicine was oxycodone , which is a likely exacerbating factor.  These were held and sodium corrected.  Discharged with HCTZ held and desvenlafaxine  reduced.  Check BMP in 1 week and if Na stable, resume normal desvenlafaxine  and substitute or resume hydrochlorothiazide /HCTZ.     Low back pain CT spine normal. MRI spine showed no evidence of cord impingement.  Shows old endplate changes and spondylosis/arthritis.  Physical exam normal.  No red flag  symptoms of cancer history, fever, leukocytosis, weight loss.  MRI ruled out infection or malignancy.  Discussed with Neurosurgery, no intervention recommended. - PT/OT - NSAIDs in limited duration safe given normal renal function here    Urine culture contamination Urine culture from 4/21 with <100K colonies of GBS and enterococcus.  In absence of symptoms of UTI or pyuria/hematuria, I suspect this is contaminant.          The [place]  Controlled Substances Registry was reviewed for this patient prior to discharge.   Procedures performed: MRI spine  Disposition: Home Diet recommendation:  Regular diet  DISCHARGE MEDICATION: Allergies as of 07/01/2023       Reactions   Penicillins Anaphylaxis   Telmisartan Other (See Comments)   Suspected cause of angioedema    Zofran  [ondansetron ]    Iodinated Contrast Media Hives   Iohexol  Other (See Comments)    Desc: iodine   Latex Rash   Nsaids Other (See Comments)   CKD        Medication List     PAUSE taking these medications    hydrochlorothiazide  25 MG tablet Wait to take this until your doctor or other care provider tells you to start again. Commonly known as: HYDRODIURIL  Take 25 mg by mouth every morning.       STOP taking these medications    oxyCODONE -acetaminophen  5-325 MG tablet Commonly known as: PERCOCET/ROXICET       TAKE these medications    acyclovir 200 MG capsule Commonly known as: ZOVIRAX Take 200 mg by mouth as needed.   albuterol  108 (90 Base) MCG/ACT inhaler  Commonly known as: VENTOLIN  HFA Inhale 1 puff into the lungs every 4 (four) hours as needed for wheezing or shortness of breath.   ALPRAZolam  0.25 MG tablet Commonly known as: XANAX  Take 0.25 mg by mouth 3 (three) times daily as needed for anxiety or sleep.   ARIPiprazole  5 MG tablet Commonly known as: ABILIFY  Take 2.5 mg by mouth at bedtime.   aspirin  EC 325 MG tablet Take 650 mg by mouth at bedtime.    desvenlafaxine  25 MG 24 hr tablet Commonly known as: PRISTIQ  Take 1 tablet (25 mg total) by mouth in the morning. What changed:  medication strength how much to take   Dexmethylphenidate  HCl 40 MG Cp24 Take 1 capsule by mouth every morning.   dicyclomine  20 MG tablet Commonly known as: BENTYL  Take 1 tablet (20 mg total) by mouth 2 (two) times daily.   levothyroxine  25 MCG tablet Commonly known as: SYNTHROID  Take 25 mcg by mouth daily before breakfast.   nadolol  20 MG tablet Commonly known as: CORGARD  Take 1 tablet (20 mg total) by mouth daily. Patient needs appt for # 90 supply or future refills. Please call office to schedule appt.   OVER THE COUNTER MEDICATION Take 1 tablet by mouth daily. Equate indigestion relief.   pramipexole  0.25 MG tablet Commonly known as: MIRAPEX  Take 0.5 mg by mouth at bedtime.   pravastatin  80 MG tablet Commonly known as: PRAVACHOL  Take 80 mg by mouth at bedtime.   prochlorperazine  5 MG tablet Commonly known as: COMPAZINE  Take 1 tablet (5 mg total) by mouth every 6 (six) hours as needed for refractory nausea / vomiting. If promethazine  ineffective   promethazine  25 MG tablet Commonly known as: PHENERGAN  Take 1 tablet (25 mg total) by mouth every 6 (six) hours as needed for nausea or vomiting.   Spiriva  Respimat 1.25 MCG/ACT Aers Generic drug: Tiotropium Bromide Monohydrate  Inhale 1 puff into the lungs daily. What changed: when to take this   traZODone  50 MG tablet Commonly known as: DESYREL  Take 50-150 mg by mouth at bedtime as needed.               Durable Medical Equipment  (From admission, onward)           Start     Ordered   07/01/23 1128  DME Walker  Once       Question Answer Comment  Walker: With 5 Inch Wheels   Patient needs a walker to treat with the following condition Lumbago      07/01/23 1130            Follow-up Information     Mordechai April, DO. Schedule an appointment as soon as possible  for a visit in 1 week(s).   Specialty: Family Medicine Contact information: [address] [place] [address] [phone]         [facility], [facility] Follow up.   Why: Please follow up with this provider for DME needs. Contact information: [address] [place] [address] [phone]         [facility] [place], [place] Follow up.   Why: This provider will follow up with you regarding home health PT/OT services upon discharge. Contact information: [address] [place] [address] [phone]                 Discharge Instructions     Discharge instructions   Complete by: As directed    **IMPORTANT  DISCHARGE INSTRUCTIONS**   From Dr. Darlyn Eke: You were admitted for low sodium  When you presented with abdominal pain and constipation, we did  CT abdomen to look for causes.  This was reassuring and showed no abnormality in the abdomen.  Here, you had loose stools, and so I would eat a high fiber diet, rich in insoluble fibers like apples and oats, to try to keep bowel movements regular.  The sodium improved with holding your hydrochlorothiazide /HCTZ  HOLD this medicine until you see Dr. Donalynn Fry  In addition, sometimes desvenlafaxine  can exacerbate low sodium, so REDUCE your desvenlafaxine  dose and go see Dr. Donalynn Fry  Resume your other home medicines  For nausea, you may take Phenergan  or compazine   For the low back pain, do the exercise taught by PT You may take naproxen 220 mg twice daily for 5 days (this is reasonably safe with your kidneys)  Go see Dr. Donalynn Fry in 1 week and get labs checked   Increase activity slowly   Complete by: As directed        Discharge Exam: Filed Weights   07/01/23 0511  Weight: 94.2 kg    General: Pt is alert, awake, not in acute distress Cardiovascular: RRR, nl S1-S2, no murmurs appreciated.   No LE edema.   Respiratory: Normal respiratory rate and rhythm.   CTAB without rales or wheezes. Abdominal: Abdomen soft and non-tender.  No distension or HSM.   Neuro/Psych: Strength symmetric in upper and lower extremities.  Judgment and insight appear normal.  Bilateral LE strength normal.  Sensation intact to cold and light touch.     Condition at discharge: fair  The results of significant diagnostics from this hospitalization (including imaging, microbiology, ancillary and laboratory) are listed below for reference.   Imaging Studies: DG CHEST PORT 1 VIEW Result Date: 07/01/2023 CLINICAL DATA:  Hyponatremia EXAM: PORTABLE CHEST 1 VIEW COMPARISON:  04/20/2023 FINDINGS: Cardiac shadow is mildly prominent but accentuated by the portable technique. Lungs are well aerated. No focal infiltrate or effusion is noted. No bony abnormality is seen. IMPRESSION: No acute abnormality noted. Electronically Signed   By: Violeta Grey M.D.   On: 07/01/2023 10:58   MR LUMBAR SPINE WO CONTRAST Result Date: 06/29/2023 CLINICAL DATA:  Lower back pain, chronic.  No recent injury. EXAM: MRI LUMBAR SPINE WITHOUT CONTRAST TECHNIQUE: Multiplanar, multisequence MR imaging of the lumbar spine was performed. No intravenous contrast was administered. COMPARISON:  CT of the thoracolumbar spine 06/22/2023. Abdominopelvic CT 06/27/2023. FINDINGS: Segmentation: Conventional anatomy assumed, with the last open disc space designated L5-S1.Concordant with prior imaging. Alignment:  Degenerative grade 1 anterolisthesis at L4-5. Vertebrae: No worrisome osseous lesion, acute fracture or pars defect. Unchanged chronic inferior endplate compression fracture at L1 with approximately 25% loss of vertebral body height. No associated marrow edema lower osseous retropulsion. Multilevel facet arthropathy. Chronic sacroiliac degenerative changes bilaterally. Conus medullaris: Extends to the L1-2 level. The conus and cauda equina appear normal. Paraspinal and other soft tissues: No significant paraspinal  findings. Unchanged mild extrahepatic biliary dilatation, within physiologic limits post cholecystectomy. Disc levels: Sagittal images demonstrate spondylosis at T10-11 and T11-12 with loss of disc height and disc bulging. No associated cord deformity. There is some foraminal narrowing at T10-11, greater on the left. T12-L1: Normal interspace. L1-2: Chronic inferior endplate compression deformity at L1. Chronic disc bulging with mild bilateral facet hypertrophy. No significant spinal stenosis or foraminal narrowing. L2-3: Preserved disc height with mild disc bulging and mild to moderate facet and ligamentous hypertrophy,  greater on the right. Mild asymmetric right lateral recess and right foraminal narrowing without nerve root encroachment. No central stenosis. L3-4: Mild loss of disc height with annular disc bulging eccentric to the right. Moderate facet and ligamentous hypertrophy. No significant central spinal stenosis. There is mild narrowing of the right lateral recess and both foramina without definite nerve root encroachment. L4-5: Advanced bilateral facet hypertrophy accounting for the grade 1 anterolisthesis. Resulting disc bulging and uncovering with preserved disc height. No significant central spinal stenosis. Mild foraminal narrowing bilaterally. L5-S1: Chronic disc bulging eccentric to the left with moderate bilateral facet hypertrophy. No spinal stenosis or significant foraminal narrowing. IMPRESSION: 1. No acute findings or clear explanation for the patient's symptoms in the lumbar spine. 2. Chronic inferior endplate compression deformity at L1. No acute osseous findings. 3. Multilevel spondylosis with disc bulging and facet arthropathy as described. No significant central spinal stenosis or definite nerve root encroachment in the lumbar spine. Mild foraminal narrowing bilaterally at L4-5. 4. Sagittal images demonstrate spondylosis at T10-11 and T11-12 with foraminal narrowing at T10-11, greater on  the left. Consider further evaluation with dedicated thoracic MRI. Electronically Signed   By: Elmon Hagedorn M.D.   On: 06/29/2023 15:43   CT Head Wo Contrast Result Date: 06/28/2023 CLINICAL DATA:  Initial evaluation for acute delirium. EXAM: CT HEAD WITHOUT CONTRAST TECHNIQUE: Contiguous axial images were obtained from the base of the skull through the vertex without intravenous contrast. RADIATION DOSE REDUCTION: This exam was performed according to the departmental dose-optimization program which includes automated exposure control, adjustment of the mA and/or kV according to patient size and/or use of iterative reconstruction technique. COMPARISON:  Prior study from 02/01/2023 FINDINGS: Brain: Cerebral volume within normal limits. Patchy hypodensity involving the supratentorial cerebral white matter, consistent chronic small vessel ischemic disease, moderately advanced in nature. Encephalomalacia involving the anterior left frontal lobe noted. No acute intracranial hemorrhage. No acute large vessel territory infarct. No mass lesion or midline shift. No hydrocephalus or extra-axial fluid collection. Vascular: No abnormal hyperdense vessel. Calcified atherosclerosis present at the skull base. Skull: Scalp soft tissues demonstrate no acute finding. Calvarium intact. Sinuses/Orbits: Globes orbital soft tissues within normal limits. Paranasal sinuses and mastoid air cells are clear. Other: None. IMPRESSION: 1. No acute intracranial abnormality. 2. Moderately advanced chronic microvascular ischemic disease with chronic left frontal encephalomalacia. Electronically Signed   By: Virgia Griffins M.D.   On: 06/28/2023 02:44   CT ABDOMEN PELVIS WO CONTRAST Result Date: 06/27/2023 CLINICAL DATA:  Bowel obstruction suspected, abdominal pain EXAM: CT ABDOMEN AND PELVIS WITHOUT CONTRAST TECHNIQUE: Multidetector CT imaging of the abdomen and pelvis was performed following the standard protocol without IV contrast.  Unenhanced CT was performed per clinician order. Lack of IV contrast limits sensitivity and specificity, especially for evaluation of abdominal/pelvic solid viscera. RADIATION DOSE REDUCTION: This exam was performed according to the departmental dose-optimization program which includes automated exposure control, adjustment of the mA and/or kV according to patient size and/or use of iterative reconstruction technique. COMPARISON:  06/12/2023 FINDINGS: Lower chest: No acute pleural or parenchymal lung disease. Hepatobiliary: Cholecystectomy. Unremarkable unenhanced appearance of the liver. No biliary duct dilation. Pancreas: Unremarkable unenhanced appearance. Spleen: Unremarkable unenhanced appearance. Adrenals/Urinary Tract: No urinary tract calculi or obstructive uropathy within either kidney. The adrenals are stable. Bladder is unremarkable. Stomach/Bowel: No bowel obstruction or ileus. The appendix is surgically absent. Scattered diverticulosis of the descending and sigmoid colon without evidence of acute diverticulitis. No bowel wall thickening or inflammatory change. Small  hiatal hernia. Vascular/Lymphatic: Aortic atherosclerosis. No enlarged abdominal or pelvic lymph nodes. Reproductive: Status post hysterectomy. No adnexal masses. Other: No free fluid or free intraperitoneal gas. No abdominal wall hernia. Musculoskeletal: No acute or destructive bony abnormalities. Chronic compression deformity inferior endplate L1 vertebral body unchanged. Reconstructed images demonstrate no additional findings. IMPRESSION: 1. No acute intra-abdominal or intrapelvic process. 2. Distal colonic diverticulosis without diverticulitis. 3.  Aortic Atherosclerosis (ICD10-I70.0). Electronically Signed   By: Bobbye Burrow M.D.   On: 06/27/2023 20:47   CT Lumbar Spine Wo Contrast Result Date: 06/23/2023 CLINICAL DATA:  Trauma and back pain EXAM: CT THORACIC AND LUMBAR SPINE WITHOUT CONTRAST TECHNIQUE: Multidetector CT imaging of  the thoracic and lumbar spine was performed without contrast. Multiplanar CT image reconstructions were also generated. RADIATION DOSE REDUCTION: This exam was performed according to the departmental dose-optimization program which includes automated exposure control, adjustment of the mA and/or kV according to patient size and/or use of iterative reconstruction technique. COMPARISON:  None Available. FINDINGS: CT THORACIC SPINE FINDINGS Alignment: Normal. Vertebrae: No acute fracture or focal pathologic process. Paraspinal and other soft tissues: Negative. Disc levels: No spinal canal stenosis CT LUMBAR SPINE FINDINGS Segmentation: 5 lumbar type vertebrae. Alignment: Grade 1 retrolisthesis at L3-4 and grade 1 anterolisthesis at L4-5 Vertebrae: L1 inferior endplate fracture is likely chronic. No other vertebral abnormality. Paraspinal and other soft tissues: Calcific aortic atherosclerosis. Disc levels: L3-4: Small disc bulge with mild facet hypertrophy. No spinal canal stenosis. L4-5: Severe facet arthrosis with grade 1 anterolisthesis and disc uncovering. No spinal canal stenosis or neural foraminal stenosis. L5-S1: Moderate facet hypertrophy and small disc bulge. No stenosis. IMPRESSION: 1. No acute fracture or static subluxation of the thoracic or lumbar spine. 2. L1 inferior endplate fracture is likely chronic. 3. Severe L4-5 facet arthrosis with grade 1 anterolisthesis. Aortic Atherosclerosis (ICD10-I70.0). Electronically Signed   By: Juanetta Nordmann M.D.   On: 06/23/2023 00:26   CT Thoracic Spine Wo Contrast Result Date: 06/23/2023 CLINICAL DATA:  Trauma and back pain EXAM: CT THORACIC AND LUMBAR SPINE WITHOUT CONTRAST TECHNIQUE: Multidetector CT imaging of the thoracic and lumbar spine was performed without contrast. Multiplanar CT image reconstructions were also generated. RADIATION DOSE REDUCTION: This exam was performed according to the departmental dose-optimization program which includes automated  exposure control, adjustment of the mA and/or kV according to patient size and/or use of iterative reconstruction technique. COMPARISON:  None Available. FINDINGS: CT THORACIC SPINE FINDINGS Alignment: Normal. Vertebrae: No acute fracture or focal pathologic process. Paraspinal and other soft tissues: Negative. Disc levels: No spinal canal stenosis CT LUMBAR SPINE FINDINGS Segmentation: 5 lumbar type vertebrae. Alignment: Grade 1 retrolisthesis at L3-4 and grade 1 anterolisthesis at L4-5 Vertebrae: L1 inferior endplate fracture is likely chronic. No other vertebral abnormality. Paraspinal and other soft tissues: Calcific aortic atherosclerosis. Disc levels: L3-4: Small disc bulge with mild facet hypertrophy. No spinal canal stenosis. L4-5: Severe facet arthrosis with grade 1 anterolisthesis and disc uncovering. No spinal canal stenosis or neural foraminal stenosis. L5-S1: Moderate facet hypertrophy and small disc bulge. No stenosis. IMPRESSION: 1. No acute fracture or static subluxation of the thoracic or lumbar spine. 2. L1 inferior endplate fracture is likely chronic. 3. Severe L4-5 facet arthrosis with grade 1 anterolisthesis. Aortic Atherosclerosis (ICD10-I70.0). Electronically Signed   By: Juanetta Nordmann M.D.   On: 06/23/2023 00:26   CT ABDOMEN PELVIS WO CONTRAST Result Date: 06/12/2023 CLINICAL DATA:  generalized abdominal pain x 3 days, IV dye allergy. EXAM: CT ABDOMEN AND  PELVIS WITHOUT CONTRAST TECHNIQUE: Multidetector CT imaging of the abdomen and pelvis was performed following the standard protocol without IV contrast. RADIATION DOSE REDUCTION: This exam was performed according to the departmental dose-optimization program which includes automated exposure control, adjustment of the mA and/or kV according to patient size and/or use of iterative reconstruction technique. COMPARISON:  CT scan abdomen and pelvis from 11/10/2018. FINDINGS: Lower chest: There are patchy atelectatic changes in the visualized  lung bases. No overt consolidation. No pleural effusion. The heart is normal in size. No pericardial effusion. Hepatobiliary: The liver is normal in size. Non-cirrhotic configuration. No suspicious mass. No intrahepatic or extrahepatic bile duct dilation. Gallbladder is surgically absent. Pancreas: Unremarkable. No pancreatic ductal dilatation or surrounding inflammatory changes. Spleen: Within normal limits. No focal lesion. Adrenals/Urinary Tract: There are bilateral subcentimeter sized hypoattenuating nodules in bilateral adrenal glands, favored to represent multiple adrenal adenomas. No suspicious renal mass. No hydronephrosis. There are 2 to 3, 1 mm nonobstructing calculi in the right kidney. No other renal or ureteric calculi. Urinary bladder is under distended, precluding optimal assessment. However, no large mass or stones identified. No perivesical fat stranding. Stomach/Bowel: There is a small sliding hiatal hernia. No disproportionate dilation of the small or large bowel loops. No evidence of abnormal bowel wall thickening or inflammatory changes. The appendix was not visualized; however there is no acute inflammatory process in the right lower quadrant. There are multiple diverticula mainly in the sigmoid colon, without imaging signs of diverticulitis. Vascular/Lymphatic: No ascites or pneumoperitoneum. No abdominal or pelvic lymphadenopathy, by size criteria. No aneurysmal dilation of the major abdominal arteries. There are mild peripheral atherosclerotic vascular calcifications of the aorta and its major branches. Reproductive: The uterus is surgically absent. No large adnexal mass. Other: There is a tiny fat containing umbilical hernia. The soft tissues and abdominal wall are otherwise unremarkable. Musculoskeletal: No suspicious osseous lesions. There are mild multilevel degenerative changes in the visualized spine. There is age indeterminate mild compression deformity of inferior endplate of L1  vertebral body, new since the prior study. No significant retropulsion or spinal canal compromise. There is no perivertebral soft tissue stranding. Findings are favored subacute/chronic in etiology. Correlate clinically. IMPRESSION: 1. No acute inflammatory process identified within the abdomen or pelvis. 2. Multiple other nonacute observations, as described above. Aortic Atherosclerosis (ICD10-I70.0). Electronically Signed   By: Beula Brunswick M.D.   On: 06/12/2023 14:33    Microbiology: Results for orders placed or performed during the hospital encounter of 06/22/23  Urine Culture     Status: Abnormal   Collection Time: 06/23/23  8:00 AM   Specimen: Urine, Clean Catch  Result Value Ref Range Status   Specimen Description   Final    URINE, CLEAN CATCH Performed at [facility], [address], [place], [place] [address]    Special Requests   Final    NONE Performed at [facility], [address], [place], [place] [address]    Culture (A)  Final    80,000 COLONIES/mL ENTEROCOCCUS FAECALIS 10,000 COLONIES/mL GROUP B STREP(S.AGALACTIAE)ISOLATED TESTING AGAINST S. AGALACTIAE NOT ROUTINELY PERFORMED DUE TO PREDICTABILITY OF AMP/PEN/VAN SUSCEPTIBILITY. Performed at [facility], [address], [place], [place] [address]    Report Status 06/25/2023 FINAL  Final   Organism ID, Bacteria ENTEROCOCCUS FAECALIS (A)  Final      Susceptibility   Enterococcus faecalis - MIC*    AMPICILLIN <=2 SENSITIVE Sensitive     NITROFURANTOIN <=16 SENSITIVE Sensitive  VANCOMYCIN 1 SENSITIVE Sensitive     * 80,000 COLONIES/mL ENTEROCOCCUS FAECALIS    Labs: CBC: Recent Labs  Lab 06/27/23 1828 06/29/23 0636  WBC 9.2 11.5*  HGB 12.8 13.5  HCT 36.5 40.2  MCV 84.9 89.1  PLT 380 323   Basic Metabolic Panel: Recent Labs  Lab 06/28/23 0731 06/28/23 1155 06/28/23 1710 06/29/23 0636 06/29/23 1241 06/29/23 1920 06/30/23 0502 07/01/23 0531  NA 124*   --    < > 127* 129* 132* 131* 134*  K 2.9*  --    < > 3.1* 3.2* 3.5 4.1 4.0  CL 86*  --    < > 92* 92* 97* 97* 99  CO2 26  --    < > 25 26 29 25 27   GLUCOSE 91  --    < > 93 111* 134* 106* 98  BUN 11  --    < > 13 11 16 20 21   CREATININE 0.73  --    < > 0.94 0.89 1.01* 1.09* 1.23*  CALCIUM 9.0  --    < > 8.7* 8.9 9.5 9.1 9.9  MG 1.7 1.6*  --   --   --   --  2.2  --    < > = values in this interval not displayed.   Liver Function Tests: Recent Labs  Lab 06/27/23 1828  AST 18  ALT 19  ALKPHOS 77  BILITOT 0.3  PROT 6.4*  ALBUMIN 4.1   CBG: Recent Labs  Lab 06/28/23 0215  GLUCAP 112*    Discharge time spent: approximately 45 minutes spent on discharge counseling, evaluation of patient on day of discharge, and coordination of discharge planning with nursing, social work, pharmacy and case management  Signed: Ephriam Hashimoto, MD [facility] 07/01/2023

## 2023-07-01 NOTE — Evaluation (Signed)
 Occupational Therapy Evaluation Patient Details Name: Becky [name] MRN: [id number] DOB: 1950-03-17 Today's Date: 07/01/2023   History of Present Illness   Becky [name] is a 73 yr old female admitted 06/27/23 with abdominal pain and constipation. She was found to have hyponatremia and hypokalemia. PMH: hypertension, CVA, TIA, asthma, GERD, hypothyroidism, CKD stage III    Clinical Impressions The pt is currently presenting with the below listed deficits (see OT problem list). As such, her ADL performance is compromised and she requires assistance for tasks, such as lower body dressing and toileting at bathroom level. During the session, she reported having chronic low back pain. She had some difficulty with performing lower body dressing, therefore she was educated on the use of a reacher and sock aid to donn and doff socks. She also had difficulty with performing posterior hygiene after a bowel movement, with OT then educating her on use of a toilet bidet and toilet aid/bottom buddy. She will continue to benefit from further OT services to maximize her independence with self-care tasks and to facilitate her safe return home. Home health OT is recommended.      If plan is discharge home, recommend the following:   Assist for transportation;A little help with bathing/dressing/bathroom;Assistance with cooking/housework     Functional Status Assessment   Patient has had a recent decline in their functional status and demonstrates the ability to make significant improvements in function in a reasonable and predictable amount of time.     Equipment Recommendations   Other (comment) (Rolling walker)     Recommendations for Other Services         Precautions/Restrictions   Restrictions Weight Bearing Restrictions Per Provider Order: No     Mobility Bed Mobility Overal bed mobility: Needs Assistance Bed Mobility: Supine to Sit     Supine to sit: Supervision,  Used rails, HOB elevated          Transfers Overall transfer level: Needs assistance Equipment used: Rolling walker (2 wheels) Transfers: Sit to/from Stand Sit to Stand: Contact guard assist                  Balance       Sitting balance - Comments: static sitting-good. dynamic sitting-fair+       Standing balance comment: CGA with RW                           ADL either performed or assessed with clinical judgement   ADL Overall ADL's : Needs assistance/impaired Eating/Feeding: Independent;Sitting   Grooming: Set up;Contact guard assist Grooming Details (indicate cue type and reason): Set-up seated or CGA standing         Upper Body Dressing : Set up;Sitting   Lower Body Dressing: Moderate assistance;With adaptive equipment Lower Body Dressing Details (indicate cue type and reason): The pt presented with moderate difficulty doffing and donning her socks seated EOB. As such, OT educated her on using a sock aid to donn socks and reacher to doff socks, for improved ease with task. Toilet Transfer: Contact guard assist;Regular Toilet;Rolling walker (2 wheels);Grab bars Toilet Transfer Details (indicate cue type and reason): The pt ambulated into the bathroom using a RW. She required use of a grab bar for added support, as well as min verbal cues for walker placement. Toileting- Clothing Manipulation and Hygiene: Moderate assistance;Sit to/from stand Toileting - Clothing Manipulation Details (indicate cue type and reason): Toileting tasks performed at bathroom level. She  required assistance for clothing management, assist for steadying in standing and increased assist with posterior peri-hygiene after she had a bowel movement. She reported occasional difficulty with managing hygiene at her baseline. OT subsequently educated her on use of a toilet bidet and toilet aid/bottom buddy to assist with hygiene management.             Vision   Additional  Comments: She correctly read the time depicted on the wall clock.     Perception         Praxis         Pertinent Vitals/Pain Pain Assessment Pain Assessment: 0-10 Pain Score: 4  Pain Location: chronic low back pain Pain Intervention(s): Monitored during session, Repositioned     Extremity/Trunk Assessment Upper Extremity Assessment Upper Extremity Assessment: Overall WFL for tasks assessed;Right hand dominant   Lower Extremity Assessment Lower Extremity Assessment: Overall WFL for tasks assessed       Communication Communication Communication: No apparent difficulties   Cognition Arousal: Alert Behavior During Therapy: WFL for tasks assessed/performed               OT - Cognition Comments: Oriented x4                         Home Living Family/patient expects to be discharged to:: Private residence Living Arrangements: Alone Available Help at Discharge: Available PRN/intermittently Type of Home: Other(Comment) (Condo) Home Access: Level entry     Home Layout: One level     Bathroom Shower/Tub: Runner, broadcasting/film/video: Shower seat - built in;Shower seat;Cane - single point          Prior Functioning/Environment Prior Level of Function : Independent/Modified Independent;Driving             Mobility Comments: Use of a cane for ambulation. ADLs Comments: Modified independent to independent with ADLs, cooking, and cleaning.    OT Problem List: Decreased strength;Impaired balance (sitting and/or standing);Decreased knowledge of use of DME or AE;Pain   OT Treatment/Interventions: Self-care/ADL training;Therapeutic exercise;Therapeutic activities;Energy conservation;DME and/or AE instruction;Patient/family education;Balance training      OT Goals(Current goals can be found in the care plan section)   Acute Rehab OT Goals OT Goal Formulation: With patient Time For Goal Achievement: 07/15/23 Potential to Achieve Goals:  Good ADL Goals Pt Will Perform Grooming: with supervision;standing Pt Will Perform Lower Body Dressing: with supervision;with adaptive equipment;sitting/lateral leans;sit to/from stand Pt Will Transfer to Toilet: with supervision;ambulating Pt Will Perform Toileting - Clothing Manipulation and hygiene: with supervision;sit to/from stand   OT Frequency:  Min 2X/week       AM-PAC OT "6 Clicks" Daily Activity     Outcome Measure Help from another person eating meals?: None Help from another person taking care of personal grooming?: A Little Help from another person toileting, which includes using toliet, bedpan, or urinal?: A Lot Help from another person bathing (including washing, rinsing, drying)?: A Little Help from another person to put on and taking off regular upper body clothing?: A Little Help from another person to put on and taking off regular lower body clothing?: A Lot 6 Click Score: 17   End of Session Equipment Utilized During Treatment: Rolling walker (2 wheels) Nurse Communication: Mobility status  Activity Tolerance: Patient tolerated treatment well Patient left: in chair;with call bell/phone within reach;with family/visitor present  OT Visit Diagnosis: Unsteadiness on feet (R26.81);Muscle weakness (generalized) (M62.81);Pain Pain - part  of body:  (back)                Time: 9147-8295 OT Time Calculation (min): 32 min Charges:  OT General Charges $OT Visit: 1 Visit OT Evaluation $OT Eval Moderate Complexity: 1 Mod OT Treatments $Self Care/Home Management : 8-22 mins    Sheralyn Dies, OTR/L 07/01/2023, 12:39 PM

## 2023-07-02 ENCOUNTER — Encounter (HOSPITAL_BASED_OUTPATIENT_CLINIC_OR_DEPARTMENT_OTHER): Admitting: Physical Therapy

## 2023-07-02 DIAGNOSIS — J4540 Moderate persistent asthma, uncomplicated: Secondary | ICD-10-CM | POA: Diagnosis not present

## 2023-07-02 DIAGNOSIS — E876 Hypokalemia: Secondary | ICD-10-CM | POA: Diagnosis not present

## 2023-07-02 DIAGNOSIS — E871 Hypo-osmolality and hyponatremia: Secondary | ICD-10-CM | POA: Diagnosis not present

## 2023-07-02 DIAGNOSIS — N183 Chronic kidney disease, stage 3 unspecified: Secondary | ICD-10-CM | POA: Diagnosis not present

## 2023-07-02 DIAGNOSIS — R0609 Other forms of dyspnea: Secondary | ICD-10-CM | POA: Diagnosis not present

## 2023-07-02 DIAGNOSIS — G3184 Mild cognitive impairment, so stated: Secondary | ICD-10-CM | POA: Diagnosis not present

## 2023-07-02 DIAGNOSIS — Z7982 Long term (current) use of aspirin: Secondary | ICD-10-CM | POA: Diagnosis not present

## 2023-07-02 DIAGNOSIS — U099 Post covid-19 condition, unspecified: Secondary | ICD-10-CM | POA: Diagnosis not present

## 2023-07-02 DIAGNOSIS — I129 Hypertensive chronic kidney disease with stage 1 through stage 4 chronic kidney disease, or unspecified chronic kidney disease: Secondary | ICD-10-CM | POA: Diagnosis not present

## 2023-07-02 DIAGNOSIS — E039 Hypothyroidism, unspecified: Secondary | ICD-10-CM | POA: Diagnosis not present

## 2023-07-08 DIAGNOSIS — Z09 Encounter for follow-up examination after completed treatment for conditions other than malignant neoplasm: Secondary | ICD-10-CM | POA: Diagnosis not present

## 2023-07-08 DIAGNOSIS — I10 Essential (primary) hypertension: Secondary | ICD-10-CM | POA: Diagnosis not present

## 2023-07-08 DIAGNOSIS — M545 Low back pain, unspecified: Secondary | ICD-10-CM | POA: Diagnosis not present

## 2023-07-08 DIAGNOSIS — E871 Hypo-osmolality and hyponatremia: Secondary | ICD-10-CM | POA: Diagnosis not present

## 2023-07-15 DIAGNOSIS — I129 Hypertensive chronic kidney disease with stage 1 through stage 4 chronic kidney disease, or unspecified chronic kidney disease: Secondary | ICD-10-CM | POA: Diagnosis not present

## 2023-07-15 DIAGNOSIS — E871 Hypo-osmolality and hyponatremia: Secondary | ICD-10-CM | POA: Diagnosis not present

## 2023-07-15 DIAGNOSIS — E039 Hypothyroidism, unspecified: Secondary | ICD-10-CM | POA: Diagnosis not present

## 2023-07-15 DIAGNOSIS — N183 Chronic kidney disease, stage 3 unspecified: Secondary | ICD-10-CM | POA: Diagnosis not present

## 2023-07-15 DIAGNOSIS — J4540 Moderate persistent asthma, uncomplicated: Secondary | ICD-10-CM | POA: Diagnosis not present

## 2023-07-15 DIAGNOSIS — E876 Hypokalemia: Secondary | ICD-10-CM | POA: Diagnosis not present

## 2023-07-16 ENCOUNTER — Other Ambulatory Visit (HOSPITAL_BASED_OUTPATIENT_CLINIC_OR_DEPARTMENT_OTHER): Payer: Self-pay | Admitting: Pulmonary Disease

## 2023-07-17 DIAGNOSIS — E871 Hypo-osmolality and hyponatremia: Secondary | ICD-10-CM | POA: Diagnosis not present

## 2023-07-17 DIAGNOSIS — N183 Chronic kidney disease, stage 3 unspecified: Secondary | ICD-10-CM | POA: Diagnosis not present

## 2023-07-17 DIAGNOSIS — E039 Hypothyroidism, unspecified: Secondary | ICD-10-CM | POA: Diagnosis not present

## 2023-07-17 DIAGNOSIS — E876 Hypokalemia: Secondary | ICD-10-CM | POA: Diagnosis not present

## 2023-07-17 DIAGNOSIS — J4540 Moderate persistent asthma, uncomplicated: Secondary | ICD-10-CM | POA: Diagnosis not present

## 2023-07-17 DIAGNOSIS — I129 Hypertensive chronic kidney disease with stage 1 through stage 4 chronic kidney disease, or unspecified chronic kidney disease: Secondary | ICD-10-CM | POA: Diagnosis not present

## 2023-07-20 NOTE — Telephone Encounter (Signed)
**Note De-identified  [place] Obfuscation** Please advise 

## 2023-07-22 DIAGNOSIS — I129 Hypertensive chronic kidney disease with stage 1 through stage 4 chronic kidney disease, or unspecified chronic kidney disease: Secondary | ICD-10-CM | POA: Diagnosis not present

## 2023-07-22 DIAGNOSIS — G8929 Other chronic pain: Secondary | ICD-10-CM | POA: Diagnosis not present

## 2023-07-22 DIAGNOSIS — E871 Hypo-osmolality and hyponatremia: Secondary | ICD-10-CM | POA: Diagnosis not present

## 2023-07-22 DIAGNOSIS — E876 Hypokalemia: Secondary | ICD-10-CM | POA: Diagnosis not present

## 2023-07-22 DIAGNOSIS — N183 Chronic kidney disease, stage 3 unspecified: Secondary | ICD-10-CM | POA: Diagnosis not present

## 2023-07-22 DIAGNOSIS — I10 Essential (primary) hypertension: Secondary | ICD-10-CM | POA: Diagnosis not present

## 2023-07-22 DIAGNOSIS — M545 Low back pain, unspecified: Secondary | ICD-10-CM | POA: Diagnosis not present

## 2023-07-22 DIAGNOSIS — J4540 Moderate persistent asthma, uncomplicated: Secondary | ICD-10-CM | POA: Diagnosis not present

## 2023-07-22 DIAGNOSIS — E039 Hypothyroidism, unspecified: Secondary | ICD-10-CM | POA: Diagnosis not present

## 2023-07-24 DIAGNOSIS — N183 Chronic kidney disease, stage 3 unspecified: Secondary | ICD-10-CM | POA: Diagnosis not present

## 2023-07-24 DIAGNOSIS — I129 Hypertensive chronic kidney disease with stage 1 through stage 4 chronic kidney disease, or unspecified chronic kidney disease: Secondary | ICD-10-CM | POA: Diagnosis not present

## 2023-07-24 DIAGNOSIS — E039 Hypothyroidism, unspecified: Secondary | ICD-10-CM | POA: Diagnosis not present

## 2023-07-24 DIAGNOSIS — E876 Hypokalemia: Secondary | ICD-10-CM | POA: Diagnosis not present

## 2023-07-24 DIAGNOSIS — E871 Hypo-osmolality and hyponatremia: Secondary | ICD-10-CM | POA: Diagnosis not present

## 2023-07-24 DIAGNOSIS — J4540 Moderate persistent asthma, uncomplicated: Secondary | ICD-10-CM | POA: Diagnosis not present

## 2023-07-29 DIAGNOSIS — J4540 Moderate persistent asthma, uncomplicated: Secondary | ICD-10-CM | POA: Diagnosis not present

## 2023-07-29 DIAGNOSIS — E039 Hypothyroidism, unspecified: Secondary | ICD-10-CM | POA: Diagnosis not present

## 2023-07-29 DIAGNOSIS — I129 Hypertensive chronic kidney disease with stage 1 through stage 4 chronic kidney disease, or unspecified chronic kidney disease: Secondary | ICD-10-CM | POA: Diagnosis not present

## 2023-07-29 DIAGNOSIS — N183 Chronic kidney disease, stage 3 unspecified: Secondary | ICD-10-CM | POA: Diagnosis not present

## 2023-07-29 DIAGNOSIS — E871 Hypo-osmolality and hyponatremia: Secondary | ICD-10-CM | POA: Diagnosis not present

## 2023-07-29 DIAGNOSIS — E876 Hypokalemia: Secondary | ICD-10-CM | POA: Diagnosis not present

## 2023-07-30 DIAGNOSIS — E871 Hypo-osmolality and hyponatremia: Secondary | ICD-10-CM | POA: Diagnosis not present

## 2023-07-30 DIAGNOSIS — N183 Chronic kidney disease, stage 3 unspecified: Secondary | ICD-10-CM | POA: Diagnosis not present

## 2023-07-30 DIAGNOSIS — E039 Hypothyroidism, unspecified: Secondary | ICD-10-CM | POA: Diagnosis not present

## 2023-07-30 DIAGNOSIS — E876 Hypokalemia: Secondary | ICD-10-CM | POA: Diagnosis not present

## 2023-07-30 DIAGNOSIS — I129 Hypertensive chronic kidney disease with stage 1 through stage 4 chronic kidney disease, or unspecified chronic kidney disease: Secondary | ICD-10-CM | POA: Diagnosis not present

## 2023-07-30 DIAGNOSIS — J4540 Moderate persistent asthma, uncomplicated: Secondary | ICD-10-CM | POA: Diagnosis not present

## 2023-07-31 DIAGNOSIS — H34831 Tributary (branch) retinal vein occlusion, right eye, with macular edema: Secondary | ICD-10-CM | POA: Diagnosis not present

## 2023-07-31 DIAGNOSIS — H04123 Dry eye syndrome of bilateral lacrimal glands: Secondary | ICD-10-CM | POA: Diagnosis not present

## 2023-07-31 DIAGNOSIS — H35033 Hypertensive retinopathy, bilateral: Secondary | ICD-10-CM | POA: Diagnosis not present

## 2023-07-31 DIAGNOSIS — H43813 Vitreous degeneration, bilateral: Secondary | ICD-10-CM | POA: Diagnosis not present

## 2023-07-31 DIAGNOSIS — H35371 Puckering of macula, right eye: Secondary | ICD-10-CM | POA: Diagnosis not present

## 2023-08-03 DIAGNOSIS — N183 Chronic kidney disease, stage 3 unspecified: Secondary | ICD-10-CM | POA: Diagnosis not present

## 2023-08-03 DIAGNOSIS — J4540 Moderate persistent asthma, uncomplicated: Secondary | ICD-10-CM | POA: Diagnosis not present

## 2023-08-03 DIAGNOSIS — I129 Hypertensive chronic kidney disease with stage 1 through stage 4 chronic kidney disease, or unspecified chronic kidney disease: Secondary | ICD-10-CM | POA: Diagnosis not present

## 2023-08-03 DIAGNOSIS — E871 Hypo-osmolality and hyponatremia: Secondary | ICD-10-CM | POA: Diagnosis not present

## 2023-08-03 DIAGNOSIS — E876 Hypokalemia: Secondary | ICD-10-CM | POA: Diagnosis not present

## 2023-08-03 DIAGNOSIS — E039 Hypothyroidism, unspecified: Secondary | ICD-10-CM | POA: Diagnosis not present

## 2023-08-06 ENCOUNTER — Encounter: Payer: Self-pay | Admitting: Cardiology

## 2023-08-06 DIAGNOSIS — E039 Hypothyroidism, unspecified: Secondary | ICD-10-CM | POA: Diagnosis not present

## 2023-08-06 DIAGNOSIS — N183 Chronic kidney disease, stage 3 unspecified: Secondary | ICD-10-CM | POA: Diagnosis not present

## 2023-08-06 DIAGNOSIS — J4540 Moderate persistent asthma, uncomplicated: Secondary | ICD-10-CM | POA: Diagnosis not present

## 2023-08-06 DIAGNOSIS — I129 Hypertensive chronic kidney disease with stage 1 through stage 4 chronic kidney disease, or unspecified chronic kidney disease: Secondary | ICD-10-CM | POA: Diagnosis not present

## 2023-08-06 DIAGNOSIS — E876 Hypokalemia: Secondary | ICD-10-CM | POA: Diagnosis not present

## 2023-08-06 DIAGNOSIS — E871 Hypo-osmolality and hyponatremia: Secondary | ICD-10-CM | POA: Diagnosis not present

## 2023-08-10 ENCOUNTER — Other Ambulatory Visit (HOSPITAL_BASED_OUTPATIENT_CLINIC_OR_DEPARTMENT_OTHER): Payer: Self-pay | Admitting: Pulmonary Disease

## 2023-08-11 NOTE — Telephone Encounter (Signed)
 Please advise on [facility] you saw them in November,can you advise on filling this prescription if able

## 2023-08-12 ENCOUNTER — Encounter: Payer: Self-pay | Admitting: Pulmonary Disease

## 2023-08-12 NOTE — Telephone Encounter (Signed)
 Please advise,you have seen patient and alva told me to send her to pcp but they dont fill it ,[facility] but she is out

## 2023-08-14 DIAGNOSIS — M7989 Other specified soft tissue disorders: Secondary | ICD-10-CM | POA: Diagnosis not present

## 2023-08-14 DIAGNOSIS — N183 Chronic kidney disease, stage 3 unspecified: Secondary | ICD-10-CM | POA: Diagnosis not present

## 2023-08-14 DIAGNOSIS — E876 Hypokalemia: Secondary | ICD-10-CM | POA: Diagnosis not present

## 2023-08-14 DIAGNOSIS — E039 Hypothyroidism, unspecified: Secondary | ICD-10-CM | POA: Diagnosis not present

## 2023-08-14 DIAGNOSIS — I129 Hypertensive chronic kidney disease with stage 1 through stage 4 chronic kidney disease, or unspecified chronic kidney disease: Secondary | ICD-10-CM | POA: Diagnosis not present

## 2023-08-14 DIAGNOSIS — J4540 Moderate persistent asthma, uncomplicated: Secondary | ICD-10-CM | POA: Diagnosis not present

## 2023-08-14 DIAGNOSIS — E871 Hypo-osmolality and hyponatremia: Secondary | ICD-10-CM | POA: Diagnosis not present

## 2023-08-18 ENCOUNTER — Emergency Department (HOSPITAL_COMMUNITY)

## 2023-08-18 ENCOUNTER — Inpatient Hospital Stay (HOSPITAL_COMMUNITY)
Admission: EM | Admit: 2023-08-18 | Discharge: 2023-08-23 | DRG: 291 | Disposition: A | Discharge status: Home Health | Attending: Family Medicine | Admitting: Family Medicine

## 2023-08-18 ENCOUNTER — Other Ambulatory Visit: Payer: Self-pay

## 2023-08-18 DIAGNOSIS — Z8673 Personal history of transient ischemic attack (TIA), and cerebral infarction without residual deficits: Secondary | ICD-10-CM | POA: Diagnosis not present

## 2023-08-18 DIAGNOSIS — E039 Hypothyroidism, unspecified: Secondary | ICD-10-CM | POA: Diagnosis not present

## 2023-08-18 DIAGNOSIS — R0902 Hypoxemia: Secondary | ICD-10-CM | POA: Diagnosis not present

## 2023-08-18 DIAGNOSIS — G4730 Sleep apnea, unspecified: Secondary | ICD-10-CM | POA: Diagnosis not present

## 2023-08-18 DIAGNOSIS — Z79890 Hormone replacement therapy (postmenopausal): Secondary | ICD-10-CM | POA: Diagnosis not present

## 2023-08-18 DIAGNOSIS — Z1152 Encounter for screening for COVID-19: Secondary | ICD-10-CM | POA: Diagnosis not present

## 2023-08-18 DIAGNOSIS — Z803 Family history of malignant neoplasm of breast: Secondary | ICD-10-CM

## 2023-08-18 DIAGNOSIS — R600 Localized edema: Secondary | ICD-10-CM | POA: Diagnosis not present

## 2023-08-18 DIAGNOSIS — J441 Chronic obstructive pulmonary disease with (acute) exacerbation: Secondary | ICD-10-CM | POA: Diagnosis present

## 2023-08-18 DIAGNOSIS — J45901 Unspecified asthma with (acute) exacerbation: Secondary | ICD-10-CM | POA: Diagnosis present

## 2023-08-18 DIAGNOSIS — Z91041 Radiographic dye allergy status: Secondary | ICD-10-CM

## 2023-08-18 DIAGNOSIS — I5041 Acute combined systolic (congestive) and diastolic (congestive) heart failure: Secondary | ICD-10-CM | POA: Diagnosis not present

## 2023-08-18 DIAGNOSIS — N1832 Chronic kidney disease, stage 3b: Secondary | ICD-10-CM | POA: Diagnosis present

## 2023-08-18 DIAGNOSIS — Z880 Allergy status to penicillin: Secondary | ICD-10-CM

## 2023-08-18 DIAGNOSIS — U099 Post covid-19 condition, unspecified: Secondary | ICD-10-CM | POA: Diagnosis not present

## 2023-08-18 DIAGNOSIS — G4733 Obstructive sleep apnea (adult) (pediatric): Secondary | ICD-10-CM | POA: Diagnosis not present

## 2023-08-18 DIAGNOSIS — J4541 Moderate persistent asthma with (acute) exacerbation: Secondary | ICD-10-CM | POA: Diagnosis not present

## 2023-08-18 DIAGNOSIS — Z7951 Long term (current) use of inhaled steroids: Secondary | ICD-10-CM

## 2023-08-18 DIAGNOSIS — E876 Hypokalemia: Secondary | ICD-10-CM | POA: Diagnosis not present

## 2023-08-18 DIAGNOSIS — Z6841 Body Mass Index (BMI) 40.0 and over, adult: Secondary | ICD-10-CM

## 2023-08-18 DIAGNOSIS — E871 Hypo-osmolality and hyponatremia: Secondary | ICD-10-CM | POA: Diagnosis not present

## 2023-08-18 DIAGNOSIS — E7800 Pure hypercholesterolemia, unspecified: Secondary | ICD-10-CM | POA: Diagnosis not present

## 2023-08-18 DIAGNOSIS — Z8249 Family history of ischemic heart disease and other diseases of the circulatory system: Secondary | ICD-10-CM | POA: Diagnosis not present

## 2023-08-18 DIAGNOSIS — Z79899 Other long term (current) drug therapy: Secondary | ICD-10-CM

## 2023-08-18 DIAGNOSIS — J9601 Acute respiratory failure with hypoxia: Secondary | ICD-10-CM | POA: Diagnosis not present

## 2023-08-18 DIAGNOSIS — M8580 Other specified disorders of bone density and structure, unspecified site: Secondary | ICD-10-CM | POA: Diagnosis not present

## 2023-08-18 DIAGNOSIS — F32A Depression, unspecified: Secondary | ICD-10-CM | POA: Diagnosis present

## 2023-08-18 DIAGNOSIS — I700 Atherosclerosis of aorta: Secondary | ICD-10-CM | POA: Diagnosis present

## 2023-08-18 DIAGNOSIS — I5033 Acute on chronic diastolic (congestive) heart failure: Secondary | ICD-10-CM | POA: Diagnosis not present

## 2023-08-18 DIAGNOSIS — Z91040 Latex allergy status: Secondary | ICD-10-CM

## 2023-08-18 DIAGNOSIS — Z886 Allergy status to analgesic agent status: Secondary | ICD-10-CM

## 2023-08-18 DIAGNOSIS — I130 Hypertensive heart and chronic kidney disease with heart failure and stage 1 through stage 4 chronic kidney disease, or unspecified chronic kidney disease: Secondary | ICD-10-CM | POA: Diagnosis not present

## 2023-08-18 DIAGNOSIS — R0600 Dyspnea, unspecified: Secondary | ICD-10-CM | POA: Diagnosis not present

## 2023-08-18 DIAGNOSIS — R0602 Shortness of breath: Secondary | ICD-10-CM | POA: Diagnosis not present

## 2023-08-18 DIAGNOSIS — Z90710 Acquired absence of both cervix and uterus: Secondary | ICD-10-CM

## 2023-08-18 DIAGNOSIS — R0609 Other forms of dyspnea: Secondary | ICD-10-CM | POA: Diagnosis not present

## 2023-08-18 DIAGNOSIS — I10 Essential (primary) hypertension: Secondary | ICD-10-CM | POA: Diagnosis not present

## 2023-08-18 DIAGNOSIS — I5031 Acute diastolic (congestive) heart failure: Secondary | ICD-10-CM | POA: Diagnosis not present

## 2023-08-18 DIAGNOSIS — K7581 Nonalcoholic steatohepatitis (NASH): Secondary | ICD-10-CM | POA: Diagnosis not present

## 2023-08-18 DIAGNOSIS — R9431 Abnormal electrocardiogram [ECG] [EKG]: Secondary | ICD-10-CM | POA: Diagnosis present

## 2023-08-18 DIAGNOSIS — Z83438 Family history of other disorder of lipoprotein metabolism and other lipidemia: Secondary | ICD-10-CM

## 2023-08-18 DIAGNOSIS — Z888 Allergy status to other drugs, medicaments and biological substances status: Secondary | ICD-10-CM

## 2023-08-18 DIAGNOSIS — Z85820 Personal history of malignant melanoma of skin: Secondary | ICD-10-CM

## 2023-08-18 LAB — CBC WITH DIFFERENTIAL/PLATELET
Abs Immature Granulocytes: 0.08 10*3/uL — ABNORMAL HIGH (ref 0.00–0.07)
Basophils Absolute: 0.1 10*3/uL (ref 0.0–0.1)
Basophils Relative: 0 %
Eosinophils Absolute: 0.3 10*3/uL (ref 0.0–0.5)
Eosinophils Relative: 3 %
HCT: 38.1 % (ref 36.0–46.0)
Hemoglobin: 12 g/dL (ref 12.0–15.0)
Immature Granulocytes: 1 %
Lymphocytes Relative: 15 %
Lymphs Abs: 1.8 10*3/uL (ref 0.7–4.0)
MCH: 29.3 pg (ref 26.0–34.0)
MCHC: 31.5 g/dL (ref 30.0–36.0)
MCV: 92.9 fL (ref 80.0–100.0)
Monocytes Absolute: 1.2 10*3/uL — ABNORMAL HIGH (ref 0.1–1.0)
Monocytes Relative: 11 %
Neutro Abs: 8.2 10*3/uL — ABNORMAL HIGH (ref 1.7–7.7)
Neutrophils Relative %: 70 %
Platelets: 295 10*3/uL (ref 150–400)
RBC: 4.1 MIL/uL (ref 3.87–5.11)
RDW: 13.3 % (ref 11.5–15.5)
WBC: 11.6 10*3/uL — ABNORMAL HIGH (ref 4.0–10.5)
nRBC: 0 % (ref 0.0–0.2)

## 2023-08-18 LAB — BLOOD GAS, ARTERIAL
Acid-Base Excess: 4 mmol/L — ABNORMAL HIGH (ref 0.0–2.0)
Bicarbonate: 29.2 mmol/L — ABNORMAL HIGH (ref 20.0–28.0)
Drawn by: 30136
FIO2: 36 %
O2 Content: 4 L/min
O2 Saturation: 95.8 %
Patient temperature: 37.2
pCO2 arterial: 45 mmHg (ref 32–48)
pH, Arterial: 7.42 (ref 7.35–7.45)
pO2, Arterial: 67 mmHg — ABNORMAL LOW (ref 83–108)

## 2023-08-18 LAB — RESP PANEL BY RT-PCR (RSV, FLU A&B, COVID)  RVPGX2
Influenza A by PCR: NEGATIVE
Influenza B by PCR: NEGATIVE
Resp Syncytial Virus by PCR: NEGATIVE
SARS Coronavirus 2 by RT PCR: NEGATIVE

## 2023-08-18 LAB — BASIC METABOLIC PANEL WITH GFR
Anion gap: 11 (ref 5–15)
BUN: 22 mg/dL (ref 8–23)
CO2: 26 mmol/L (ref 22–32)
Calcium: 8.8 mg/dL — ABNORMAL LOW (ref 8.9–10.3)
Chloride: 103 mmol/L (ref 98–111)
Creatinine, Ser: 1.4 mg/dL — ABNORMAL HIGH (ref 0.44–1.00)
GFR, Estimated: 40 mL/min — ABNORMAL LOW (ref >60)
Glucose, Bld: 102 mg/dL — ABNORMAL HIGH (ref 70–99)
Potassium: 3.2 mmol/L — ABNORMAL LOW (ref 3.5–5.1)
Sodium: 140 mmol/L (ref 135–145)

## 2023-08-18 LAB — LACTIC ACID, PLASMA: Lactic Acid, Venous: 1.1 mmol/L (ref 0.5–1.9)

## 2023-08-18 LAB — BRAIN NATRIURETIC PEPTIDE: B Natriuretic Peptide: 233.2 pg/mL — ABNORMAL HIGH (ref 0.0–100.0)

## 2023-08-18 MED ORDER — FUROSEMIDE 10 MG/ML IJ SOLN
40.0000 mg | Freq: Once | INTRAMUSCULAR | Status: AC
  Administered 2023-08-18: 40 mg via INTRAVENOUS
  Filled 2023-08-18: qty 4

## 2023-08-18 MED ORDER — ALBUTEROL SULFATE (2.5 MG/3ML) 0.083% IN NEBU
5.0000 mg | INHALATION_SOLUTION | Freq: Once | RESPIRATORY_TRACT | Status: AC
  Administered 2023-08-18: 5 mg via RESPIRATORY_TRACT
  Filled 2023-08-18: qty 6

## 2023-08-18 MED ORDER — ENOXAPARIN SODIUM 40 MG/0.4ML IJ SOSY
40.0000 mg | PREFILLED_SYRINGE | Freq: Every day | INTRAMUSCULAR | Status: DC
  Administered 2023-08-18 – 2023-08-22 (×4): 40 mg via SUBCUTANEOUS
  Filled 2023-08-18 (×5): qty 0.4

## 2023-08-18 MED ORDER — SODIUM CHLORIDE 0.9 % IV SOLN
250.0000 mL | INTRAVENOUS | Status: AC | PRN
Start: 2023-08-18 — End: 2023-08-19

## 2023-08-18 MED ORDER — DICYCLOMINE HCL 20 MG PO TABS
20.0000 mg | ORAL_TABLET | Freq: Two times a day (BID) | ORAL | Status: DC
  Administered 2023-08-18 – 2023-08-23 (×8): 20 mg via ORAL
  Filled 2023-08-18 (×11): qty 1

## 2023-08-18 MED ORDER — LEVOTHYROXINE SODIUM 25 MCG PO TABS: 25.0000 ug | ORAL_TABLET | Freq: Every day | ORAL | Status: DC

## 2023-08-18 MED ORDER — LEVOTHYROXINE SODIUM 25 MCG PO TABS
25.0000 ug | ORAL_TABLET | Freq: Every day | ORAL | Status: DC
  Administered 2023-08-19 – 2023-08-23 (×4): 25 ug via ORAL
  Filled 2023-08-18 (×5): qty 1

## 2023-08-18 MED ORDER — LEVOFLOXACIN IN D5W 500 MG/100ML IV SOLN
500.0000 mg | Freq: Once | INTRAVENOUS | Status: AC
  Administered 2023-08-18: 500 mg via INTRAVENOUS
  Filled 2023-08-18: qty 100

## 2023-08-18 MED ORDER — ACETAMINOPHEN 650 MG RE SUPP: 650.0000 mg | Freq: Four times a day (QID) | RECTAL | Status: DC | PRN

## 2023-08-18 MED ORDER — ACETAMINOPHEN 325 MG PO TABS
650.0000 mg | ORAL_TABLET | Freq: Four times a day (QID) | ORAL | Status: DC | PRN
Start: 2023-08-18 — End: 2023-08-23

## 2023-08-18 MED ORDER — POTASSIUM CHLORIDE CRYS ER 20 MEQ PO TBCR
40.0000 meq | EXTENDED_RELEASE_TABLET | Freq: Once | ORAL | Status: AC
  Administered 2023-08-18: 40 meq via ORAL
  Filled 2023-08-18: qty 2

## 2023-08-18 MED ORDER — SODIUM CHLORIDE 0.9% FLUSH
3.0000 mL | Freq: Two times a day (BID) | INTRAVENOUS | Status: DC
Start: 2023-08-18 — End: 2023-08-23
  Administered 2023-08-18 – 2023-08-22 (×7): 3 mL via INTRAVENOUS

## 2023-08-18 MED ORDER — DEXMETHYLPHENIDATE HCL ER 40 MG PO CP24: 1.0000 | ORAL_CAPSULE | Freq: Every morning | ORAL | Status: DC

## 2023-08-18 MED ORDER — PRAMIPEXOLE DIHYDROCHLORIDE 0.25 MG PO TABS
0.5000 mg | ORAL_TABLET | Freq: Every day | ORAL | Status: DC
  Administered 2023-08-18 – 2023-08-22 (×4): 0.5 mg via ORAL
  Filled 2023-08-18 (×6): qty 2

## 2023-08-18 MED ORDER — BUPROPION HCL ER (XL) 150 MG PO TB24
150.0000 mg | ORAL_TABLET | Freq: Every morning | ORAL | Status: DC
  Administered 2023-08-19 – 2023-08-23 (×4): 150 mg via ORAL
  Filled 2023-08-18 (×5): qty 1

## 2023-08-18 MED ORDER — SODIUM CHLORIDE 0.9% FLUSH: 3.0000 mL | INTRAVENOUS | Status: DC | PRN

## 2023-08-18 MED ORDER — IPRATROPIUM-ALBUTEROL 0.5-2.5 (3) MG/3ML IN SOLN
3.0000 mL | Freq: Four times a day (QID) | RESPIRATORY_TRACT | Status: DC | PRN
  Administered 2023-08-19: 3 mL via RESPIRATORY_TRACT
  Filled 2023-08-18: qty 3

## 2023-08-18 MED ORDER — ARIPIPRAZOLE 5 MG PO TABS
5.0000 mg | ORAL_TABLET | Freq: Every day | ORAL | Status: DC
  Administered 2023-08-18 – 2023-08-22 (×4): 5 mg via ORAL
  Filled 2023-08-18 (×5): qty 1

## 2023-08-18 MED ORDER — NADOLOL 20 MG PO TABS
20.0000 mg | ORAL_TABLET | Freq: Every day | ORAL | Status: DC
  Administered 2023-08-19 – 2023-08-23 (×4): 20 mg via ORAL
  Filled 2023-08-18 (×5): qty 1

## 2023-08-18 NOTE — ED Notes (Addendum)
 Pt ambulated down the hall on room air and destat to 81%.

## 2023-08-18 NOTE — H&P (Signed)
 [facility] H&P    Patient Demographics:    Becky [name], is a 73 y.o. female  MRN: [id number]  DOB - 01/14/1951  Admit Date - 08/18/2023  Referring MD/NP/PA: Sueellen Emery  Outpatient Primary MD for the patient is Mordechai April, DO  Patient coming from: PCP office  Chief complaint-shortness of breath   HPI:    Becky [name]  is a 73 y.o. female, with history of hypertension, TIA, hyperlipidemia, CVA, melanoma, CKD stage IIIb, hypothyroidism, depression, NASH, long COVID syndrome, osteopenia.  Patient says that she has been having shortness of breath for past few days and also has bilateral leg swelling, has gained 12 pounds weight over past 1 month.  Her PCP had put her on Lasix 20 mg daily.  Today she went to PCP office, she was given Solu-Medrol , magnesium  and nebulizer with some improvement but remained short of breath.  She was sent to ED for further evaluation.  Patient O2 sats was 88% on room air per EMS. In the ED patient was hypoxic on ambulation, with O2 sats dropped to 81% on ambulation. Chest x-ray was clear, BNP 233.2 Denies chest pain Denies nausea vomiting or diarrhea Denies abdominal pain Denies coughing up phlegm, no fever or chills    Review of systems:    In addition to the HPI above,    All other systems reviewed and are negative.    Past History of the following :    Past Medical History:  Diagnosis Date   Chronic kidney disease    CVA (cerebral vascular accident) (HCC) 11/14/2013   Depression    followed by Dr Trilby Fujisawa frequently 10/01/2015   Genital herpes    History of melanoma    followed by derm Dr Joanne Muckle   Hyperlipidemia    Hypertension    MCI (mild cognitive impairment) 10/01/2015   Pure hypercholesterolemia 11/14/2013   Stroke [facility])    Old stroke seen on MRI 12/2009, carotid doppler w minimal plaque, MRI off the head otherwise neg for acute disease.   Thyroid  disease     hypothyroid. Followed by Dr Deborra Falter   TIA (transient ischemic attack) 8/08   followed by neurologist Dr Janett Medin      Past Surgical History:  Procedure Laterality Date   ABDOMINAL HYSTERECTOMY     APPENDECTOMY     CHOLECYSTECTOMY     MELANOMA EXCISION     x2      Social History:      Social History   Tobacco Use   Smoking status: Never   Smokeless tobacco: Never  Substance Use Topics   Alcohol use: No       Family History :     Family History  Problem Relation Age of Onset   Breast cancer Mother    Hyperlipidemia Father    Hypertension Father    Heart attack Father    Hypertension Sister    Hyperlipidemia Sister    Dementia Maternal Aunt       Home Medications:   Prior to Admission medications  Medication Sig Start Date End Date Taking? Authorizing Provider  acyclovir (ZOVIRAX) 200 MG capsule Take 200 mg by mouth as needed. 09/05/14  Yes [provider]  albuterol  (VENTOLIN  HFA) 108 (90 Base) MCG/ACT inhaler Inhale 1 puff into the lungs every 4 (four) hours as needed for wheezing or shortness of breath. 05/14/23  Yes [provider]  ALPRAZolam  (XANAX ) 0.25 MG tablet Take 0.25 mg by mouth 3 (three) times daily as needed for anxiety or sleep. 08/10/15  Yes [provider]  amLODipine  (NORVASC ) 10 MG tablet Take 10 mg by mouth daily. 07/08/23  Yes [provider]  ARIPiprazole  (ABILIFY ) 5 MG tablet Take 5 mg by mouth at bedtime. 05/14/23  Yes [provider]  aspirin  EC 325 MG tablet Take 650 mg by mouth at bedtime.   Yes [provider]  buPROPion (WELLBUTRIN XL) 150 MG 24 hr tablet Take 150 mg by mouth every morning. 07/01/23  Yes [provider]  desvenlafaxine  (PRISTIQ ) 25 MG 24 hr tablet Take 1 tablet (25 mg total) by mouth in the morning. 07/01/23  Yes Danford, Willis Harter, MD  Dexmethylphenidate  HCl 40 MG CP24 Take 1 capsule by mouth every morning. 05/20/23  Yes [provider]  dicyclomine   (BENTYL ) 20 MG tablet Take 1 tablet (20 mg total) by mouth 2 (two) times daily. 06/12/23  Yes Lyna Sandhoff, PA-C  fluticasone -salmeterol (ADVAIR ) 500-50 MCG/ACT AEPB Inhale 1 puff into the lungs in the morning and at bedtime. 08/04/23  Yes [provider]  furosemide (LASIX) 20 MG tablet Take 20 mg by mouth daily. 08/14/23  Yes [provider]  levothyroxine  (SYNTHROID ) 25 MCG tablet Take 25 mcg by mouth daily before breakfast.   Yes [provider]  nadolol  (CORGARD ) 20 MG tablet Take 1 tablet (20 mg total) by mouth daily. Patient needs appt for # 90 supply or future refills. Please call office to schedule appt. 07/16/22  Yes Camnitz, Babetta Lesch, MD  OVER THE COUNTER MEDICATION Take 1 tablet by mouth daily. Equate indigestion relief.   Yes [provider]  pramipexole  (MIRAPEX ) 0.25 MG tablet Take 0.5 mg by mouth at bedtime. 12/26/15  Yes [provider]  pravastatin  (PRAVACHOL ) 80 MG tablet Take 80 mg by mouth at bedtime. 08/01/21  Yes [provider]  prochlorperazine  (COMPAZINE ) 5 MG tablet Take 1 tablet (5 mg total) by mouth every 6 (six) hours as needed for refractory nausea / vomiting. If promethazine  ineffective 07/01/23  Yes Danford, Willis Harter, MD  promethazine  (PHENERGAN ) 25 MG tablet Take 1 tablet (25 mg total) by mouth every 6 (six) hours as needed for nausea or vomiting. 06/12/23  Yes Lyna Sandhoff, PA-C  Tiotropium Bromide  Monohydrate (SPIRIVA  RESPIMAT) 1.25 MCG/ACT AERS Inhale 1 puff into the lungs daily. Patient taking differently: Inhale 1 puff into the lungs in the morning. 05/19/23  Yes Quillian Brunt, MD  traZODone  (DESYREL ) 50 MG tablet Take 50-150 mg by mouth at bedtime as needed for sleep. 03/20/23  Yes [provider]  hydrochlorothiazide  (HYDRODIURIL ) 25 MG tablet Take 25 mg by mouth every morning. Patient not taking: Reported on 08/18/2023 08/01/21   [provider]     Allergies:     Allergies   Allergen Reactions   Penicillins Anaphylaxis   Telmisartan Other (See Comments)    Suspected cause of angioedema    Zofran  [Ondansetron ]    Iodinated Contrast Media Hives   Iohexol  Other (See Comments)     Desc: iodine  Latex Rash   Nsaids Other (See Comments)    CKD      Physical Exam:   Vitals  Blood pressure (!) 141/86, pulse 63, temperature 99 F (37.2 C), temperature source Oral, resp. rate 17, height 5' (1.524 m), weight 96.6 kg, SpO2 94%.  1.  General: Appears in moderate respiratory distress  2. Psychiatric: Alert , Oriented x 3, intact insight and judgment  3. Neurologic: Cranial nerves II through XII grossly intact  4. HEENMT:  Atraumatic normocephalic, extraocular muscles are intact  5. Respiratory : Decreased breath sounds bilaterally  6. Cardiovascular : S1-S2, regular, no murmur auscultated, 2+ pitting edema of the lower extremities  7. Gastrointestinal:  Abdomen is soft, nontender, no organomegaly  8. Skin:  No rashes noted      Data Review:    CBC Recent Labs  Lab 08/18/23 1304  WBC 11.6*  HGB 12.0  HCT 38.1  PLT 295  MCV 92.9  MCH 29.3  MCHC 31.5  RDW 13.3  LYMPHSABS 1.8  MONOABS 1.2*  EOSABS 0.3  BASOSABS 0.1   ------------------------------------------------------------------------------------------------------------------  Results for orders placed or performed during the hospital encounter of 08/18/23 (from the past 48 hours)  Basic metabolic panel     Status: Abnormal   Collection Time: 08/18/23  1:04 PM  Result Value Ref Range   Sodium 140 135 - 145 mmol/L   Potassium 3.2 (L) 3.5 - 5.1 mmol/L   Chloride 103 98 - 111 mmol/L   CO2 26 22 - 32 mmol/L   Glucose, Bld 102 (H) 70 - 99 mg/dL    Comment: Glucose reference range applies only to samples taken after fasting for at least 8 hours.   BUN 22 8 - 23 mg/dL   Creatinine, Ser 4.09 (H) 0.44 - 1.00 mg/dL   Calcium 8.8 (L) 8.9 - 10.3 mg/dL   GFR, Estimated 40 (L)  >60 mL/min    Comment: (NOTE) Calculated using the CKD-EPI Creatinine Equation (2021)    Anion gap 11 5 - 15    Comment: Performed at [facility], [address] [place], [place] [address]  Brain natriuretic peptide     Status: Abnormal   Collection Time: 08/18/23  1:04 PM  Result Value Ref Range   B Natriuretic Peptide 233.2 (H) 0.0 - 100.0 pg/mL    Comment: Performed at [facility], [address], [place], [place] [address]  CBC with Differential     Status: Abnormal   Collection Time: 08/18/23  1:04 PM  Result Value Ref Range   WBC 11.6 (H) 4.0 - 10.5 K/uL   RBC 4.10 3.87 - 5.11 MIL/uL   Hemoglobin 12.0 12.0 - 15.0 g/dL   HCT 56.2 13.0 - 86.5 %   MCV 92.9 80.0 - 100.0 fL   MCH 29.3 26.0 - 34.0 pg   MCHC 31.5 30.0 - 36.0 g/dL   RDW 78.4 69.6 - 29.5 %   Platelets 295 150 - 400 K/uL   nRBC 0.0 0.0 - 0.2 %   Neutrophils Relative % 70 %   Neutro Abs 8.2 (H) 1.7 - 7.7 K/uL   Lymphocytes Relative 15 %   Lymphs Abs 1.8 0.7 - 4.0 K/uL   Monocytes Relative 11 %   Monocytes Absolute 1.2 (H) 0.1 - 1.0 K/uL   Eosinophils Relative 3 %   Eosinophils Absolute 0.3 0.0 - 0.5 K/uL   Basophils Relative 0 %   Basophils Absolute 0.1 0.0 - 0.1 K/uL   Immature  Granulocytes 1 %   Abs Immature Granulocytes 0.08 (H) 0.00 - 0.07 K/uL    Comment: Performed at [facility], [address], [place], [place] [address]  Resp panel by RT-PCR (RSV, Flu A&B, Covid) Anterior Nasal Swab     Status: None   Collection Time: 08/18/23  1:09 PM   Specimen: Anterior Nasal Swab  Result Value Ref Range   SARS Coronavirus 2 by RT PCR NEGATIVE NEGATIVE    Comment: (NOTE) SARS-CoV-2 target nucleic acids are NOT DETECTED.  The SARS-CoV-2 RNA is generally detectable in upper respiratory specimens during the acute phase of infection. The lowest concentration of SARS-CoV-2 viral copies this assay can detect is 138 copies/mL. A negative result does not  preclude SARS-Cov-2 infection and should not be used as the sole basis for treatment or other patient management decisions. A negative result may occur with  improper specimen collection/handling, submission of specimen other than nasopharyngeal swab, presence of viral mutation(s) within the areas targeted by this assay, and inadequate number of viral copies(<138 copies/mL). A negative result must be combined with clinical observations, patient history, and epidemiological information. The expected result is Negative.  Fact Sheet for Patients:  BloggerCourse.com  Fact Sheet for Healthcare Providers:  SeriousBroker.it  This test is no t yet approved or cleared by the [place]  FDA and  has been authorized for detection and/or diagnosis of SARS-CoV-2 by FDA under an Emergency Use Authorization (EUA). This EUA will remain  in effect (meaning this test can be used) for the duration of the COVID-19 declaration under Section 564(b)(1) of the Act, 21 U.S.C.section 360bbb-3(b)(1), unless the authorization is terminated  or revoked sooner.       Influenza A by PCR NEGATIVE NEGATIVE   Influenza B by PCR NEGATIVE NEGATIVE    Comment: (NOTE) The Xpert Xpress SARS-CoV-2/FLU/RSV plus assay is intended as an aid in the diagnosis of influenza from Nasopharyngeal swab specimens and should not be used as a sole basis for treatment. Nasal washings and aspirates are unacceptable for Xpert Xpress SARS-CoV-2/FLU/RSV testing.  Fact Sheet for Patients: BloggerCourse.com  Fact Sheet for Healthcare Providers: SeriousBroker.it  This test is not yet approved or cleared by the [place]  FDA and has been authorized for detection and/or diagnosis of SARS-CoV-2 by FDA under an Emergency Use Authorization (EUA). This EUA will remain in effect (meaning this test can be used) for the duration of  the COVID-19 declaration under Section 564(b)(1) of the Act, 21 U.S.C. section 360bbb-3(b)(1), unless the authorization is terminated or revoked.     Resp Syncytial Virus by PCR NEGATIVE NEGATIVE    Comment: (NOTE) Fact Sheet for Patients: BloggerCourse.com  Fact Sheet for Healthcare Providers: SeriousBroker.it  This test is not yet approved or cleared by the [place]  FDA and has been authorized for detection and/or diagnosis of SARS-CoV-2 by FDA under an Emergency Use Authorization (EUA). This EUA will remain in effect (meaning this test can be used) for the duration of the COVID-19 declaration under Section 564(b)(1) of the Act, 21 U.S.C. section 360bbb-3(b)(1), unless the authorization is terminated or revoked.  Performed at [facility], [address] [place], [place] [address]   Lactic acid, plasma     Status: None   Collection Time: 08/18/23  4:20 PM  Result Value Ref Range   Lactic Acid, Venous 1.1 0.5 - 1.9 mmol/L    Comment: Performed at [facility], [address], [place], [place] [address]  Chemistries  Recent Labs  Lab 08/18/23 1304  NA 140  K 3.2*  CL 103  CO2 26  GLUCOSE 102*  BUN 22  CREATININE 1.40*  CALCIUM 8.8*   ------------------------------------------------------------------------------------------------------------------  ------------------------------------------------------------------------------------------------------------------ GFR: Estimated Creatinine Clearance: 37.8 mL/min (A) (by C-G formula based on SCr of 1.4 mg/dL (H)). Liver Function Tests: No results for input(s): AST, ALT, ALKPHOS, BILITOT, PROT, ALBUMIN in the last 168 hours. No results for input(s): LIPASE, AMYLASE in the last 168 hours. No results for input(s): AMMONIA in the last 168 hours. Coagulation Profile: No results for input(s): INR, PROTIME in  the last 168 hours. Cardiac Enzymes: No results for input(s): CKTOTAL, CKMB, CKMBINDEX, TROPONINI in the last 168 hours. BNP (last 3 results) Recent Labs    05/13/23 1602  PROBNP 545*   HbA1C: No results for input(s): HGBA1C in the last 72 hours. CBG: No results for input(s): GLUCAP in the last 168 hours. Lipid Profile: No results for input(s): CHOL, HDL, LDLCALC, TRIG, CHOLHDL, LDLDIRECT in the last 72 hours. Thyroid  Function Tests: No results for input(s): TSH, T4TOTAL, FREET4, T3FREE, THYROIDAB in the last 72 hours. Anemia Panel: No results for input(s): VITAMINB12, FOLATE, FERRITIN, TIBC, IRON, RETICCTPCT in the last 72 hours.  --------------------------------------------------------------------------------------------------------------- Urine analysis:    Component Value Date/Time   COLORURINE YELLOW 06/27/2023 1952   APPEARANCEUR CLEAR 06/27/2023 1952   APPEARANCEUR Clear 06/30/2013 1836   LABSPEC 1.016 06/27/2023 1952   LABSPEC 1.015 06/30/2013 1836   PHURINE 6.5 06/27/2023 1952   GLUCOSEU NEGATIVE 06/27/2023 1952   GLUCOSEU Negative 06/30/2013 1836   HGBUR NEGATIVE 06/27/2023 1952   BILIRUBINUR NEGATIVE 06/27/2023 1952   BILIRUBINUR Negative 06/30/2013 1836   KETONESUR NEGATIVE 06/27/2023 1952   PROTEINUR TRACE (A) 06/27/2023 1952   UROBILINOGEN 0.2 12/17/2009 1932   NITRITE NEGATIVE 06/27/2023 1952   LEUKOCYTESUR TRACE (A) 06/27/2023 1952   LEUKOCYTESUR Trace 06/30/2013 1836      Imaging Results:    DG Chest Port 1 View Result Date: 08/18/2023 CLINICAL DATA:  Short of breath, hypoxia EXAM: PORTABLE CHEST 1 VIEW COMPARISON:  07/01/2023 FINDINGS: Single frontal view of the chest demonstrates an enlarged cardiac silhouette. No acute airspace disease, effusion, or pneumothorax. No acute bony abnormalities. IMPRESSION: 1. Enlarged cardiac silhouette. 2. No acute airspace disease. Electronically Signed   By: Bobbye Burrow M.D.   On: 08/18/2023 14:37    My personal review of EKG: Rhythm NSR, no ST-T changes   Assessment & Plan:    Principal Problem:   Acute hypoxemic respiratory failure (HCC)   Acute hypoxemic respiratory failure-multifactorial in setting of long COVID, diastolic heart failure.  Patient's last echo from 2023 showed EF of 65 to 70% with grade 1 diastolic dysfunction.  Will give 1 dose of Lasix 40 mg IV and assess response.  Start DuoNeb nebulizers every 6 hours as needed Acute on chronic diastolic heart failure-patient has gained 12 pounds over past few weeks, has bilateral lower extremity edema.  Lasix given as above.  Will monitor strict intake and output.  BNP 233. Hypothyroidism-continue Synthroid  Hyperlipidemia-continue pravastatin  Depression-continue Abilify , bupropion    DVT Prophylaxis-   Lovenox    AM Labs Ordered, also please review Full Orders  Family Communication: Admission, patients condition and plan of care including tests being ordered have been discussed with the patient and her daughter at bedside who indicate understanding and agree with the plan and Code Status.  Code Status: Full code  Admission status: Inpatient :The appropriate admission status for this patient  is INPATIENT. Inpatient status is judged to be reasonable and necessary in order to provide the required intensity of service to ensure the patient's safety. The patient's presenting symptoms, physical exam findings, and initial radiographic and laboratory data in the context of their chronic comorbidities is felt to place them at high risk for further clinical deterioration. Furthermore, it is not anticipated that the patient will be medically stable for discharge from the hospital within 2 midnights of admission. The following factors support the admission status of inpatient.          * I certify that at the point of admission it is my clinical judgment that the patient will require inpatient  hospital care spanning beyond 2 midnights from the point of admission due to high intensity of service, high risk for further deterioration and high frequency of surveillance required.*  Time spent in minutes : 60 minutes   Angus Amini S Emmanuell Kantz M.D

## 2023-08-18 NOTE — ED Provider Notes (Signed)
 [place] EMERGENCY DEPARTMENT AT [facility] Provider Note   CSN: [id number] Arrival date & time: 08/18/23  1239     Patient presents with: Shortness of Breath   Becky [name] is a 73 y.o. female.   Pt is a 73 yo female with pmhx significant for HTN, TIA, HLD, CVA, melanoma, CKD, hypothyroidism, depression, NASH, long covid and osteopenia. Pt presents to the ED today with SOB.  She came from her doctor's office.  Pt was given solumedrol, mag and nebs and has improved some, but remains sob.  88% RA per EMS.        Prior to Admission medications   Medication Sig Start Date End Date Taking? Authorizing Provider  acyclovir (ZOVIRAX) 200 MG capsule Take 200 mg by mouth as needed. 09/05/14  Yes [provider]  albuterol  (VENTOLIN  HFA) 108 (90 Base) MCG/ACT inhaler Inhale 1 puff into the lungs every 4 (four) hours as needed for wheezing or shortness of breath. 05/14/23  Yes [provider]  ALPRAZolam  (XANAX ) 0.25 MG tablet Take 0.25 mg by mouth 3 (three) times daily as needed for anxiety or sleep. 08/10/15  Yes [provider]  amLODipine  (NORVASC ) 10 MG tablet Take 10 mg by mouth daily. 07/08/23  Yes [provider]  ARIPiprazole  (ABILIFY ) 5 MG tablet Take 5 mg by mouth at bedtime. 05/14/23  Yes [provider]  aspirin  EC 325 MG tablet Take 650 mg by mouth at bedtime.   Yes [provider]  buPROPion (WELLBUTRIN XL) 150 MG 24 hr tablet Take 150 mg by mouth every morning. 07/01/23  Yes [provider]  desvenlafaxine  (PRISTIQ ) 25 MG 24 hr tablet Take 1 tablet (25 mg total) by mouth in the morning. 07/01/23  Yes Danford, Willis Harter, MD  Dexmethylphenidate  HCl 40 MG CP24 Take 1 capsule by mouth every morning. 05/20/23  Yes [provider]  dicyclomine  (BENTYL ) 20 MG tablet Take 1 tablet (20 mg total) by mouth 2 (two) times daily. 06/12/23  Yes Lyna Sandhoff, PA-C  fluticasone -salmeterol  (ADVAIR ) 500-50 MCG/ACT AEPB Inhale 1 puff into the lungs in the morning and at bedtime. 08/04/23  Yes [provider]  furosemide (LASIX) 20 MG tablet Take 20 mg by mouth daily. 08/14/23  Yes [provider]  levothyroxine  (SYNTHROID ) 25 MCG tablet Take 25 mcg by mouth daily before breakfast.   Yes [provider]  nadolol  (CORGARD ) 20 MG tablet Take 1 tablet (20 mg total) by mouth daily. Patient needs appt for # 90 supply or future refills. Please call office to schedule appt. 07/16/22  Yes Camnitz, Babetta Lesch, MD  OVER THE COUNTER MEDICATION Take 1 tablet by mouth daily. Equate indigestion relief.   Yes [provider]  pramipexole  (MIRAPEX ) 0.25 MG tablet Take 0.5 mg by mouth at bedtime. 12/26/15  Yes [provider]  pravastatin  (PRAVACHOL ) 80 MG tablet Take 80 mg by mouth at bedtime. 08/01/21  Yes [provider]  prochlorperazine  (COMPAZINE ) 5 MG tablet Take 1 tablet (5 mg total) by mouth every 6 (six) hours as needed for refractory nausea / vomiting. If promethazine  ineffective 07/01/23  Yes Danford, Willis Harter, MD  promethazine  (PHENERGAN ) 25 MG tablet Take 1 tablet (25 mg total) by mouth every 6 (six) hours as needed for nausea or vomiting. 06/12/23  Yes Lyna Sandhoff, PA-C  Tiotropium Bromide  Monohydrate (SPIRIVA  RESPIMAT) 1.25 MCG/ACT AERS Inhale 1 puff into the lungs daily. Patient taking differently: Inhale 1 puff into the lungs  in the morning. 05/19/23  Yes Quillian Brunt, MD  traZODone  (DESYREL ) 50 MG tablet Take 50-150 mg by mouth at bedtime as needed for sleep. 03/20/23  Yes [provider]  hydrochlorothiazide  (HYDRODIURIL ) 25 MG tablet Take 25 mg by mouth every morning. Patient not taking: Reported on 08/18/2023 08/01/21   [provider]    Allergies: Penicillins, Telmisartan, Zofran  [ondansetron ], Iodinated contrast media, Iohexol , Latex, and Nsaids    Review of Systems  Respiratory:  Positive for shortness of  breath and wheezing.   All other systems reviewed and are negative.   Updated Vital Signs BP (!) 141/86 (BP Location: Right Arm)   Pulse 63   Temp 99 F (37.2 C) (Oral)   Resp 17   Ht 5' (1.524 m)   Wt 96.6 kg   SpO2 94%   BMI 41.60 kg/m   Physical Exam Vitals and nursing note reviewed.  Constitutional:      Appearance: Normal appearance. She is obese.  HENT:     Head: Normocephalic and atraumatic.     Right Ear: External ear normal.     Left Ear: External ear normal.     Nose: Nose normal.     Mouth/Throat:     Mouth: Mucous membranes are dry.   Eyes:     Extraocular Movements: Extraocular movements intact.     Conjunctiva/sclera: Conjunctivae normal.     Pupils: Pupils are equal, round, and reactive to light.    Cardiovascular:     Rate and Rhythm: Normal rate and regular rhythm.     Pulses: Normal pulses.     Heart sounds: Normal heart sounds.  Pulmonary:     Effort: Tachypnea present.     Breath sounds: Wheezing present.  Abdominal:     General: Abdomen is flat.     Palpations: Abdomen is soft.   Musculoskeletal:        General: Normal range of motion.     Cervical back: Normal range of motion and neck supple.   Skin:    General: Skin is warm.     Capillary Refill: Capillary refill takes less than 2 seconds.   Neurological:     General: No focal deficit present.     Mental Status: She is alert and oriented to person, place, and time.   Psychiatric:        Mood and Affect: Mood normal.        Behavior: Behavior normal.     (all labs ordered are listed, but only abnormal results are displayed) Labs Reviewed  BASIC METABOLIC PANEL WITH GFR - Abnormal; Notable for the following components:      Result Value   Potassium 3.2 (*)    Glucose, Bld 102 (*)    Creatinine, Ser 1.40 (*)    Calcium 8.8 (*)    GFR, Estimated 40 (*)    All other components within normal limits  BRAIN NATRIURETIC PEPTIDE - Abnormal; Notable for the following components:    B Natriuretic Peptide 233.2 (*)    All other components within normal limits  CBC WITH DIFFERENTIAL/PLATELET - Abnormal; Notable for the following components:   WBC 11.6 (*)    Neutro Abs 8.2 (*)    Monocytes Absolute 1.2 (*)    Abs Immature Granulocytes 0.08 (*)    All other components within normal limits  RESP PANEL BY RT-PCR (RSV, FLU A&B, COVID)  [id number]  CULTURE, BLOOD (ROUTINE X 2)  CULTURE, BLOOD (ROUTINE X 2)  LACTIC ACID,  PLASMA  LACTIC ACID, PLASMA    EKG: EKG Interpretation Date/Time:  Tuesday August 18 2023 12:50:31 EDT Ventricular Rate:  61 PR Interval:    QRS Duration:  99 QT Interval:  465 QTC Calculation: 469 R Axis:   25  Text Interpretation: Normal sinus rhythm Borderline repolarization abnormality Confirmed by Sueellen Emery [phone]) on 08/18/2023 2:47:55 PM  Radiology: [facility] Query Chest Port 1 View Result Date: 08/18/2023 CLINICAL DATA:  Short of breath, hypoxia EXAM: PORTABLE CHEST 1 VIEW COMPARISON:  07/01/2023 FINDINGS: Single frontal view of the chest demonstrates an enlarged cardiac silhouette. No acute airspace disease, effusion, or pneumothorax. No acute bony abnormalities. IMPRESSION: 1. Enlarged cardiac silhouette. 2. No acute airspace disease. Electronically Signed   By: Bobbye Burrow M.D.   On: 08/18/2023 14:37     Procedures   Medications Ordered in the ED  levofloxacin (LEVAQUIN) IVPB 500 mg (500 mg Intravenous New Bag/Given 08/18/23 1651)  albuterol  (PROVENTIL ) (2.5 MG/3ML) 0.083% nebulizer solution 5 mg (5 mg Nebulization Given 08/18/23 1324)  potassium chloride  SA (KLOR-CON  M) CR tablet 40 mEq (40 mEq Oral Given 08/18/23 1653)  albuterol  (PROVENTIL ) (2.5 MG/3ML) 0.083% nebulizer solution 5 mg (5 mg Nebulization Given 08/18/23 1653)  furosemide (LASIX) injection 40 mg (40 mg Intravenous Given 08/18/23 1700)                                    Medical Decision Making Amount and/or Complexity of Data Reviewed Labs: ordered. Radiology:  ordered.  Risk Prescription drug management. Decision regarding hospitalization.   This patient presents to the ED for concern of sob, this involves an extensive number of treatment options, and is a complaint that carries with it a high risk of complications and morbidity.  The differential diagnosis includes copd exac, pna, covid/flu/rsv, bronchitis   Co morbidities that complicate the patient evaluation  HTN, TIA, HLD, CVA, COPD, melanoma, CKD, hypothyroidism, depression, NASH, and osteopenia   Additional history obtained:  Additional history obtained from epic chart review External records from outside source obtained and reviewed including EMS report   Lab Tests:  I Ordered, and personally interpreted labs.  The pertinent results include:  cbc with wbc elevated at 11.6, bmp with k low at 3.2 and cr 1.4 (cr 1.23 in April); covid/flu/rsv neg   Imaging Studies ordered:  I ordered imaging studies including cxr  I independently visualized and interpreted imaging which showed  . Enlarged cardiac silhouette.  2. No acute airspace disease.   I agree with the radiologist interpretation   Cardiac Monitoring:  The patient was maintained on a cardiac monitor.  I personally viewed and interpreted the cardiac monitored which showed an underlying rhythm of: nsr   Medicines ordered and prescription drug management:  I ordered medication including nebs  for sx  Reevaluation of the patient after these medicines showed that the patient improved I have reviewed the patients home medicines and have made adjustments as needed   Test Considered:  ct   Critical Interventions:  Nebs/oxygen   Consultations Obtained:  I requested consultation with the hospitalist,  and discussed lab and imaging findings as well as pertinent plan - they will admit   Problem List / ED Course:  RAD exac:  no formal dx of COPD or asthma.  Pt said she's been treated for long covid with meds  used to tx COPD and asthma.  Pt has improved, but when she gets  up and moves, she drops to the mid-80s on RA.  Pt brought back to her room and put back on 2L and is given another neb.   Reevaluation:  After the interventions noted above, I reevaluated the patient and found that they have :improved   Social Determinants of Health:  Lives at home   Dispostion:  After consideration of the diagnostic results and the patients response to treatment, I feel that the patent would benefit from admission.       Final diagnoses:  Moderate persistent reactive airway disease with acute exacerbation    ED Discharge Orders     None          Sueellen Emery, MD 08/18/23 1706

## 2023-08-18 NOTE — ED Notes (Signed)
 ED TO INPATIENT HANDOFF REPORT  ED Nurse Name and Phone #: Marilee Showers [phone]  S Name/Age/Gender Wilda Handsome 73 y.o. female Room/Bed: WA12/WA12  Code Status   Code Status: Full Code  Home/SNF/Other Home Patient oriented to: self, place, time, and situation Is this baseline? Yes   Triage Complete: Triage complete  Chief Complaint Acute hypoxemic respiratory failure (HCC) [J96.01]  Triage Note Pt BIBA from doctor's office, RA sat was around 90%, was being seen for recent breathing problems. Given albulterol with little improvement. EMS got 88% RA, gave duoneg, 125mg  solumedrol, 2g mag. Rhonchi all fields, pt reports tightness. Denies CP, no new cough but has had one. 22ga LH     Allergies Allergies  Allergen Reactions   Penicillins Anaphylaxis   Telmisartan Other (See Comments)    Suspected cause of angioedema    Zofran  [Ondansetron ]    Iodinated Contrast Media Hives   Iohexol  Other (See Comments)     Desc: iodine    Latex Rash   Nsaids Other (See Comments)    CKD     Level of Care/Admitting Diagnosis ED Disposition     ED Disposition  Admit   Condition  --   Comment  Hospital Area: [facility] [[id number]]  Level of Care: Progressive [102]  Admit to Progressive based on following criteria: RESPIRATORY PROBLEMS hypoxemic/hypercapnic respiratory failure that is responsive to NIPPV (BiPAP) or High Flow Nasal Cannula (6-80 lpm). Frequent assessment/intervention, no > Q2 hrs < Q4 hrs, to maintain oxygenation and pulmonary hygiene.  May admit patient to [facility] or [facility] Smalling if equivalent level of care is available:: No  Covid Evaluation: Confirmed COVID Negative  Diagnosis: Acute hypoxemic respiratory failure [facility]) [[id number]]  Admitting Physician: Ozell Blunt [[id number]]  Attending Physician: Ozell Blunt [[id number]]  Certification:: I certify this patient will need inpatient services for at least 2 midnights  Expected Medical Readiness:  08/20/2023          B Medical/Surgery History Past Medical History:  Diagnosis Date   Chronic kidney disease    CVA (cerebral vascular accident) (HCC) 11/14/2013   Depression    followed by Dr Trilby Fujisawa frequently 10/01/2015   Genital herpes    History of melanoma    followed by derm Dr Joanne Muckle   Hyperlipidemia    Hypertension    MCI (mild cognitive impairment) 10/01/2015   Pure hypercholesterolemia 11/14/2013   Stroke [facility])    Old stroke seen on MRI 12/2009, carotid doppler w minimal plaque, MRI off the head otherwise neg for acute disease.   Thyroid  disease    hypothyroid. Followed by Dr Deborra Falter   TIA (transient ischemic attack) 8/08   followed by neurologist Dr Janett Medin   Past Surgical History:  Procedure Laterality Date   ABDOMINAL HYSTERECTOMY     APPENDECTOMY     CHOLECYSTECTOMY     MELANOMA EXCISION     x2     A IV Location/Drains/Wounds Patient Lines/Drains/Airways Status     Active Line/Drains/Airways     Name Placement date Placement time Site Days   Peripheral IV 08/18/23 22 G Anterior;Left Hand 08/18/23  --  Hand  less than 1            Intake/Output Last 24 hours No intake or output data in the 24 hours ending 08/18/23 1726  Labs/Imaging Results for orders placed or performed during the hospital encounter of 08/18/23 (from the past 48 hours)  Basic metabolic panel  Status: Abnormal   Collection Time: 08/18/23  1:04 PM  Result Value Ref Range   Sodium 140 135 - 145 mmol/L   Potassium 3.2 (L) 3.5 - 5.1 mmol/L   Chloride 103 98 - 111 mmol/L   CO2 26 22 - 32 mmol/L   Glucose, Bld 102 (H) 70 - 99 mg/dL    Comment: Glucose reference range applies only to samples taken after fasting for at least 8 hours.   BUN 22 8 - 23 mg/dL   Creatinine, Ser 9.14 (H) 0.44 - 1.00 mg/dL   Calcium 8.8 (L) 8.9 - 10.3 mg/dL   GFR, Estimated 40 (L) >60 mL/min    Comment: (NOTE) Calculated using the CKD-EPI Creatinine Equation (2021)    Anion gap 11 5 - 15     Comment: Performed at [facility], [address], [place], [place] [address]  Brain natriuretic peptide     Status: Abnormal   Collection Time: 08/18/23  1:04 PM  Result Value Ref Range   B Natriuretic Peptide 233.2 (H) 0.0 - 100.0 pg/mL    Comment: Performed at [facility], [address], [place], [place] [address]  CBC with Differential     Status: Abnormal   Collection Time: 08/18/23  1:04 PM  Result Value Ref Range   WBC 11.6 (H) 4.0 - 10.5 K/uL   RBC 4.10 3.87 - 5.11 MIL/uL   Hemoglobin 12.0 12.0 - 15.0 g/dL   HCT 86.5 78.4 - 69.6 %   MCV 92.9 80.0 - 100.0 fL   MCH 29.3 26.0 - 34.0 pg   MCHC 31.5 30.0 - 36.0 g/dL   RDW 29.5 28.4 - 13.2 %   Platelets 295 150 - 400 K/uL   nRBC 0.0 0.0 - 0.2 %   Neutrophils Relative % 70 %   Neutro Abs 8.2 (H) 1.7 - 7.7 K/uL   Lymphocytes Relative 15 %   Lymphs Abs 1.8 0.7 - 4.0 K/uL   Monocytes Relative 11 %   Monocytes Absolute 1.2 (H) 0.1 - 1.0 K/uL   Eosinophils Relative 3 %   Eosinophils Absolute 0.3 0.0 - 0.5 K/uL   Basophils Relative 0 %   Basophils Absolute 0.1 0.0 - 0.1 K/uL   Immature Granulocytes 1 %   Abs Immature Granulocytes 0.08 (H) 0.00 - 0.07 K/uL    Comment: Performed at [facility], [address] [place], [place] [address]  Resp panel by RT-PCR (RSV, Flu A&B, Covid) Anterior Nasal Swab     Status: None   Collection Time: 08/18/23  1:09 PM   Specimen: Anterior Nasal Swab  Result Value Ref Range   SARS Coronavirus 2 by RT PCR NEGATIVE NEGATIVE    Comment: (NOTE) SARS-CoV-2 target nucleic acids are NOT DETECTED.  The SARS-CoV-2 RNA is generally detectable in upper respiratory specimens during the acute phase of infection. The lowest concentration of SARS-CoV-2 viral copies this assay can detect is 138 copies/mL. A negative result does not preclude SARS-Cov-2 infection and should not be used as the sole basis for treatment or other patient management  decisions. A negative result may occur with  improper specimen collection/handling, submission of specimen other than nasopharyngeal swab, presence of viral mutation(s) within the areas targeted by this assay, and inadequate number of viral copies(<138 copies/mL). A negative result must be combined with clinical observations, patient history, and epidemiological information. The expected result is Negative.  Fact Sheet for Patients:  BloggerCourse.com  Fact Sheet for Healthcare Providers:  SeriousBroker.it  This test is no t yet approved or cleared by the [place]  FDA and  has been authorized for detection and/or diagnosis of SARS-CoV-2 by FDA under an Emergency Use Authorization (EUA). This EUA will remain  in effect (meaning this test can be used) for the duration of the COVID-19 declaration under Section 564(b)(1) of the Act, 21 U.S.C.section 360bbb-3(b)(1), unless the authorization is terminated  or revoked sooner.       Influenza A by PCR NEGATIVE NEGATIVE   Influenza B by PCR NEGATIVE NEGATIVE    Comment: (NOTE) The Xpert Xpress SARS-CoV-2/FLU/RSV plus assay is intended as an aid in the diagnosis of influenza from Nasopharyngeal swab specimens and should not be used as a sole basis for treatment. Nasal washings and aspirates are unacceptable for Xpert Xpress SARS-CoV-2/FLU/RSV testing.  Fact Sheet for Patients: BloggerCourse.com  Fact Sheet for Healthcare Providers: SeriousBroker.it  This test is not yet approved or cleared by the [place]  FDA and has been authorized for detection and/or diagnosis of SARS-CoV-2 by FDA under an Emergency Use Authorization (EUA). This EUA will remain in effect (meaning this test can be used) for the duration of the COVID-19 declaration under Section 564(b)(1) of the Act, 21 U.S.C. section 360bbb-3(b)(1), unless the authorization  is terminated or revoked.     Resp Syncytial Virus by PCR NEGATIVE NEGATIVE    Comment: (NOTE) Fact Sheet for Patients: BloggerCourse.com  Fact Sheet for Healthcare Providers: SeriousBroker.it  This test is not yet approved or cleared by the [place]  FDA and has been authorized for detection and/or diagnosis of SARS-CoV-2 by FDA under an Emergency Use Authorization (EUA). This EUA will remain in effect (meaning this test can be used) for the duration of the COVID-19 declaration under Section 564(b)(1) of the Act, 21 U.S.C. section 360bbb-3(b)(1), unless the authorization is terminated or revoked.  Performed at [facility], [address], [place], [place] [address]   Lactic acid, plasma     Status: None   Collection Time: 08/18/23  4:20 PM  Result Value Ref Range   Lactic Acid, Venous 1.1 0.5 - 1.9 mmol/L    Comment: Performed at [facility], [address], [place], [place] [address]   DG Chest Port 1 View Result Date: 08/18/2023 CLINICAL DATA:  Short of breath, hypoxia EXAM: PORTABLE CHEST 1 VIEW COMPARISON:  07/01/2023 FINDINGS: Single frontal view of the chest demonstrates an enlarged cardiac silhouette. No acute airspace disease, effusion, or pneumothorax. No acute bony abnormalities. IMPRESSION: 1. Enlarged cardiac silhouette. 2. No acute airspace disease. Electronically Signed   By: Bobbye Burrow M.D.   On: 08/18/2023 14:37    Pending Labs Unresulted Labs (From admission, onward)     Start     Ordered   08/18/23 1715  Blood gas, arterial  Once,   R        08/18/23 1714   08/18/23 1548  Culture, blood (routine x 2)  BLOOD CULTURE X 2,   R (with STAT occurrences)      08/18/23 1547   08/18/23 1548  Lactic acid, plasma  (Lactic Acid)  Now then every 2 hours,   R (with STAT occurrences)      08/18/23 1547   Signed and Held  CBC  (enoxaparin  (LOVENOX )    CrCl >/= 30 ml/min)   Once,   R       Comments: Baseline for enoxaparin  therapy IF NOT ALREADY DRAWN.  Notify MD if PLT < 100  K.    Signed and Held   Signed and Held  Creatinine, serum  (enoxaparin  (LOVENOX )    CrCl >/= 30 ml/min)  Once,   R       Comments: Baseline for enoxaparin  therapy IF NOT ALREADY DRAWN.    Signed and Held   Signed and Held  Creatinine, serum  (enoxaparin  (LOVENOX )    CrCl >/= 30 ml/min)  Weekly,   R     Comments: while on enoxaparin  therapy    Signed and Held   Signed and Held  CBC  Tomorrow morning,   R        Signed and Held   Signed and Held  Comprehensive metabolic panel  Tomorrow morning,   R        Signed and Held            Vitals/Pain Today's Vitals   08/18/23 1253 08/18/23 1254  BP: (!) 141/86   Pulse: 63   Resp: 17   Temp: 99 F (37.2 C)   TempSrc: Oral   SpO2: 94%   Weight:  96.6 kg  Height:  5' (1.524 m)  PainSc:  0-No pain    Isolation Precautions No active isolations  Medications Medications  levofloxacin (LEVAQUIN) IVPB 500 mg (500 mg Intravenous New Bag/Given 08/18/23 1651)  albuterol  (PROVENTIL ) (2.5 MG/3ML) 0.083% nebulizer solution 5 mg (5 mg Nebulization Given 08/18/23 1324)  potassium chloride  SA (KLOR-CON  M) CR tablet 40 mEq (40 mEq Oral Given 08/18/23 1653)  albuterol  (PROVENTIL ) (2.5 MG/3ML) 0.083% nebulizer solution 5 mg (5 mg Nebulization Given 08/18/23 1653)  furosemide (LASIX) injection 40 mg (40 mg Intravenous Given 08/18/23 1700)    Mobility walks     Focused Assessments    R Recommendations: See Admitting Provider Note  Report given to:   Additional Notes:

## 2023-08-18 NOTE — ED Triage Notes (Signed)
 Pt BIBA from doctor's office, RA sat was around 90%, was being seen for recent breathing problems. Given albulterol with little improvement. EMS got 88% RA, gave duoneg, 125mg  solumedrol, 2g mag. Rhonchi all fields, pt reports tightness. Denies CP, no new cough but has had one. 22ga LH

## 2023-08-19 DIAGNOSIS — J9601 Acute respiratory failure with hypoxia: Secondary | ICD-10-CM | POA: Diagnosis not present

## 2023-08-19 DIAGNOSIS — J4541 Moderate persistent asthma with (acute) exacerbation: Secondary | ICD-10-CM | POA: Diagnosis not present

## 2023-08-19 LAB — COMPREHENSIVE METABOLIC PANEL WITH GFR
ALT: 18 U/L (ref 0–44)
AST: 19 U/L (ref 15–41)
Albumin: 4.1 g/dL (ref 3.5–5.0)
Alkaline Phosphatase: 85 U/L (ref 38–126)
Anion gap: 12 (ref 5–15)
BUN: 28 mg/dL — ABNORMAL HIGH (ref 8–23)
CO2: 26 mmol/L (ref 22–32)
Calcium: 9.4 mg/dL (ref 8.9–10.3)
Chloride: 100 mmol/L (ref 98–111)
Creatinine, Ser: 1.4 mg/dL — ABNORMAL HIGH (ref 0.44–1.00)
GFR, Estimated: 40 mL/min — ABNORMAL LOW (ref >60)
Glucose, Bld: 158 mg/dL — ABNORMAL HIGH (ref 70–99)
Potassium: 4.6 mmol/L (ref 3.5–5.1)
Sodium: 138 mmol/L (ref 135–145)
Total Bilirubin: 0.6 mg/dL (ref 0.0–1.2)
Total Protein: 7.5 g/dL (ref 6.5–8.1)

## 2023-08-19 LAB — CBC
HCT: 44.2 % (ref 36.0–46.0)
Hemoglobin: 13.8 g/dL (ref 12.0–15.0)
MCH: 29.5 pg (ref 26.0–34.0)
MCHC: 31.2 g/dL (ref 30.0–36.0)
MCV: 94.4 fL (ref 80.0–100.0)
Platelets: 309 10*3/uL (ref 150–400)
RBC: 4.68 MIL/uL (ref 3.87–5.11)
RDW: 13.2 % (ref 11.5–15.5)
WBC: 14.2 10*3/uL — ABNORMAL HIGH (ref 4.0–10.5)
nRBC: 0 % (ref 0.0–0.2)

## 2023-08-19 MED ORDER — ALPRAZOLAM 0.25 MG PO TABS
0.2500 mg | ORAL_TABLET | Freq: Three times a day (TID) | ORAL | Status: DC | PRN
  Administered 2023-08-19: 0.25 mg via ORAL
  Filled 2023-08-19: qty 1

## 2023-08-19 MED ORDER — IPRATROPIUM-ALBUTEROL 0.5-2.5 (3) MG/3ML IN SOLN
3.0000 mL | Freq: Three times a day (TID) | RESPIRATORY_TRACT | Status: DC
  Administered 2023-08-21 – 2023-08-22 (×3): 3 mL via RESPIRATORY_TRACT
  Filled 2023-08-19 (×7): qty 3

## 2023-08-19 MED ORDER — FLUTICASONE FUROATE-VILANTEROL 200-25 MCG/ACT IN AEPB
1.0000 | INHALATION_SPRAY | Freq: Every day | RESPIRATORY_TRACT | Status: DC
  Administered 2023-08-19 – 2023-08-23 (×4): 1 via RESPIRATORY_TRACT
  Filled 2023-08-19: qty 28

## 2023-08-19 MED ORDER — METHYLPREDNISOLONE SODIUM SUCC 40 MG IJ SOLR
40.0000 mg | Freq: Two times a day (BID) | INTRAMUSCULAR | Status: DC
  Administered 2023-08-19 – 2023-08-23 (×7): 40 mg via INTRAVENOUS
  Filled 2023-08-19 (×9): qty 1

## 2023-08-19 MED ORDER — TRAZODONE HCL 100 MG PO TABS
100.0000 mg | ORAL_TABLET | Freq: Every day | ORAL | Status: AC
  Administered 2023-08-19 – 2023-08-21 (×2): 100 mg via ORAL
  Filled 2023-08-19 (×3): qty 1

## 2023-08-19 MED ORDER — FUROSEMIDE 20 MG PO TABS
20.0000 mg | ORAL_TABLET | Freq: Every day | ORAL | Status: DC
  Administered 2023-08-19: 20 mg via ORAL
  Filled 2023-08-19: qty 1

## 2023-08-19 MED ORDER — IPRATROPIUM-ALBUTEROL 0.5-2.5 (3) MG/3ML IN SOLN
3.0000 mL | Freq: Four times a day (QID) | RESPIRATORY_TRACT | Status: DC
  Administered 2023-08-19 (×2): 3 mL via RESPIRATORY_TRACT
  Filled 2023-08-19 (×2): qty 3

## 2023-08-19 NOTE — Evaluation (Signed)
 Physical Therapy Evaluation Patient Details Name: Becky [name] MRN: [id number] DOB: Jul 18, 1950 Today's Date: 08/19/2023  History of Present Illness  73 yo female admitted wtih acute respiratory failure, HF. Hx of TIA, CVA, CKD, hypothyroidism, CKD, NASH  Clinical Impression  On eval, pt was Supv-CGA for mobility. She walked ~120 feet with a RW. Dyspnea 2/4. O2 90% on RA. Pt tolerated activity well but did have some fatigue towards end of distance. Will plan to follow and progress activity as tolerated. Recommend HHPT.        If plan is discharge home, recommend the following: A little help with walking and/or transfers;A little help with bathing/dressing/bathroom;Assistance with cooking/housework;Assist for transportation;Help with stairs or ramp for entrance   Can travel by private vehicle        Equipment Recommendations None recommended by PT  Recommendations for Other Services       Functional Status Assessment Patient has had a recent decline in their functional status and demonstrates the ability to make significant improvements in function in a reasonable and predictable amount of time.     Precautions / Restrictions Precautions Precautions: Fall Precaution/Restrictions Comments: monitorO2 Restrictions Weight Bearing Restrictions Per Provider Order: No      Mobility  Bed Mobility Overal bed mobility: Modified Independent                  Transfers Overall transfer level: Needs assistance Equipment used: Rolling walker (2 wheels) Transfers: Sit to/from Stand Sit to Stand: Supervision           General transfer comment: Cues for safety    Ambulation/Gait Ambulation/Gait assistance: Contact guard assist Gait Distance (Feet): 120 Feet Assistive device: Rolling walker (2 wheels)         General Gait Details: O2 90% on RA, dyspnea 2/4. Fatigues fairly easily  Careers information officer     Tilt Bed    Modified Rankin  (Stroke Patients Only)       Balance Overall balance assessment: Needs assistance         Standing balance support: During functional activity, Reliant on assistive device for balance Standing balance-Leahy Scale: Fair                               Pertinent Vitals/Pain Pain Assessment Pain Assessment: No/denies pain    Home Living Family/patient expects to be discharged to:: Private residence Living Arrangements: Alone Available Help at Discharge: Available PRN/intermittently Type of Home: Apartment Home Access: Level entry       Home Layout: One level Home Equipment: Shower seat - built in;Shower seat;Cane - single Librarian, academic (2 wheels)      Prior Function Prior Level of Function : Independent/Modified Independent;Driving             Mobility Comments: Use of a cane for ambulation.       Extremity/Trunk Assessment   Upper Extremity Assessment Upper Extremity Assessment: Generalized weakness    Lower Extremity Assessment Lower Extremity Assessment: Generalized weakness    Cervical / Trunk Assessment Cervical / Trunk Assessment: Normal  Communication   Communication Communication: No apparent difficulties    Cognition Arousal: Alert Behavior During Therapy: WFL for tasks assessed/performed   PT - Cognitive impairments: No apparent impairments  Following commands: Intact       Cueing Cueing Techniques: Verbal cues     General Comments      Exercises     Assessment/Plan    PT Assessment Patient needs continued PT services  PT Problem List Decreased strength;Decreased range of motion;Decreased activity tolerance;Decreased balance;Decreased mobility;Decreased knowledge of use of DME;Pain       PT Treatment Interventions DME instruction;Gait training;Therapeutic activities;Therapeutic exercise;Patient/family education;Balance training    PT Goals (Current goals can be found in the  Care Plan section)  Acute Rehab PT Goals Patient Stated Goal: home PT Goal Formulation: With patient Time For Goal Achievement: 09/02/23 Potential to Achieve Goals: Good    Frequency Min 3X/week     Co-evaluation               AM-PAC PT 6 Clicks Mobility  Outcome Measure Help needed turning from your back to your side while in a flat bed without using bedrails?: None Help needed moving from lying on your back to sitting on the side of a flat bed without using bedrails?: None Help needed moving to and from a bed to a chair (including a wheelchair)?: A Little Help needed standing up from a chair using your arms (e.g., wheelchair or bedside chair)?: A Little Help needed to walk in hospital room?: A Little Help needed climbing 3-5 steps with a railing? : A Little 6 Click Score: 20    End of Session Equipment Utilized During Treatment: Gait belt Activity Tolerance: Patient tolerated treatment well Patient left: in bed;with call bell/phone within reach;with bed alarm set   PT Visit Diagnosis: Difficulty in walking, not elsewhere classified (R26.2)    Time: 1610-9604 PT Time Calculation (min) (ACUTE ONLY): 20 min   Charges:   PT Evaluation $PT Eval Low Complexity: 1 Low   PT General Charges $$ ACUTE PT VISIT: 1 Visit           Tanda Falter, PT Acute Rehabilitation  Office: [phone]

## 2023-08-19 NOTE — Progress Notes (Signed)
 Mobility Specialist - Progress Note Nurse requested Mobility Specialist to perform oxygen saturation test with pt which includes removing pt from oxygen both at rest and while ambulating.  Below are the results from that testing.     Patient Saturations on Room Air at Rest = spO2 96%  Patient Saturations on Room Air while Ambulating = sp02 91% .  Patient Saturations on n/a Liters of oxygen while Ambulating = sp02 n/a%  Reported results to nurse.     08/19/23 1048  Mobility  Activity Ambulated with assistance in hallway  Level of Assistance Contact guard assist, steadying assist  Assistive Device Front wheel walker  Distance Ambulated (ft) 100 ft  Range of Motion/Exercises Active  Activity Response Tolerated well  Mobility Referral Yes  Mobility visit 1 Mobility  Mobility Specialist Start Time (ACUTE ONLY) 1020  Mobility Specialist Stop Time (ACUTE ONLY) 1048  Mobility Specialist Time Calculation (min) (ACUTE ONLY) 28 min   Pt was found in bed and agreeable to ambulate. C/o feeling SOB throughout session. SPO2 decreased upon returning to recliner chair to 88% but able to increase within a second to 95%. At EOS was left on recliner chair with all needs met. Call bell in reach.   Lorna Rose,  Mobility Specialist Can be reached via Secure Chat

## 2023-08-19 NOTE — Progress Notes (Signed)
 [facility]  PROGRESS NOTE  Becky [name] ZOX:[id number] DOB: March 18, 1950 DOA: 08/18/2023 PCP: Mordechai April, DO   Brief HPI:   73 y.o. female, with history of hypertension, TIA, hyperlipidemia, CVA, melanoma, CKD stage IIIb, hypothyroidism, depression, NASH, long COVID syndrome, osteopenia.  Patient says that she has been having shortness of breath for past few days and also has bilateral leg swelling, has gained 12 pounds weight over past 1 month.  Her PCP had put her on Lasix 20 mg daily.  Today she went to PCP office, she was given Solu-Medrol , magnesium  and nebulizer with some improvement but remained short of breath.  She was sent to ED for further evaluation.  Patient O2 sats was 88% on room air per EMS.     Assessment/Plan:   Acute hypoxemic respiratory failure-multifactorial in setting of long COVID, diastolic heart failure.  Patient's last echo from 2023 showed EF of 65 to 70% with grade 1 diastolic dysfunction.  She was given 1 dose of Lasix 40 mg IV, did not have much urine output.  Start DuoNeb nebulizers every 6 hours, will start Solu-Medrol  40 mg IV every 12 hours.  Pulse ox on ambulation showed 91% on room air.  Acute on chronic diastolic heart failure-patient has gained 12 pounds over past few weeks, has bilateral lower extremity edema.  Lasix given as above.  Will monitor strict intake and output.  BNP 233.  Hypothyroidism-continue Synthroid   Hyperlipidemia-continue pravastatin   Depression-continue Abilify , bupropion  CKD stage IIIb-creatinine at baseline    Medications     ARIPiprazole   5 mg Oral QHS   buPROPion  150 mg Oral q morning   dicyclomine   20 mg Oral BID   enoxaparin  (LOVENOX ) injection  40 mg Subcutaneous QHS   levothyroxine   25 mcg Oral Q0600   nadolol   20 mg Oral Daily   pramipexole   0.5 mg Oral QHS   sodium chloride  flush  3 mL Intravenous Q12H     Data Reviewed:   CBG:  No results for input(s): GLUCAP in the last 168  hours.  SpO2: 100 % O2 Flow Rate (L/min): 3 L/min    Vitals:   08/18/23 2242 08/19/23 0248 08/19/23 0300 08/19/23 0706  BP: (!) 145/63 (!) 128/57  136/74  Pulse: 62 62  64  Resp: 20     Temp: 98.3 F (36.8 C) 97.7 F (36.5 C)  97.8 F (36.6 C)  TempSrc: Oral Oral  Oral  SpO2: 96% (!) 89% 96% 100%  Weight:      Height:          Data Reviewed:  Basic Metabolic Panel: Recent Labs  Lab 08/18/23 1304 08/19/23 0359  NA 140 138  K 3.2* 4.6  CL 103 100  CO2 26 26  GLUCOSE 102* 158*  BUN 22 28*  CREATININE 1.40* 1.40*  CALCIUM 8.8* 9.4    CBC: Recent Labs  Lab 08/18/23 1304 08/19/23 0359  WBC 11.6* 14.2*  NEUTROABS 8.2*  --   HGB 12.0 13.8  HCT 38.1 44.2  MCV 92.9 94.4  PLT 295 309    LFT Recent Labs  Lab 08/19/23 0359  AST 19  ALT 18  ALKPHOS 85  BILITOT 0.6  PROT 7.5  ALBUMIN 4.1     Antibiotics: Anti-infectives (From admission, onward)    Start     Dose/Rate Route Frequency Ordered Stop   08/18/23 1600  levofloxacin (LEVAQUIN) IVPB 500 mg        500 mg 100 mL/hr over 60  Minutes Intravenous  Once 08/18/23 1547 08/18/23 1751        DVT prophylaxis: Lovenox   Code Status: Full code  Family Communication:    CONSULTS    Subjective   Breathing has improved today.   Objective    Physical Examination:   General-appears in no acute distress Heart-S1-S2, regular, no murmur auscultated Lungs-clear to auscultation bilaterally, no wheezing or crackles auscultated Abdomen-soft, nontender, no organomegaly Extremities-1+ edema in the lower extremities bilaterally  Neuro-alert, oriented x3, no focal deficit noted   Status is: Inpatient:             Becky [name]   [facility] If 7PM-7AM, please contact night-coverage at [email or web address], Office  [phone]   08/19/2023, 9:16 AM  LOS: 1 day

## 2023-08-19 NOTE — TOC Initial Note (Signed)
 Transition of Care [facility]) - Initial/Assessment Note   Patient Details  Name: Becky [name] MRN: [id number] Date of Birth: 1950/06/25  Transition of [facility]) CM/SW Contact:    Zenon Hilda, LCSW Phone Number: 08/19/2023, 10:30 AM  Clinical Narrative: Patient is from home alone. Patient is currently requiring 3L/min oxygen. TOC to follow for possible home oxygen.  Barriers to Discharge: Continued Medical Work up  Expected Discharge Plan and Services In-house Referral: Clinical Social Work Living arrangements for the past 2 months: Apartment  Prior Living Arrangements/Services Living arrangements for the past 2 months: Apartment Lives with:: Self Patient language and need for interpreter reviewed:: Yes Do you feel safe going back to the place where you live?: Yes      Need for Family Participation in Patient Care: No (Comment) Care giver support system in place?: Yes (comment) Criminal Activity/Legal Involvement Pertinent to Current Situation/Hospitalization: No - Comment as needed  Activities of Daily Living ADL Screening (condition at time of admission) Independently performs ADLs?: Yes (appropriate for developmental age) Is the patient deaf or have difficulty hearing?: Yes Does the patient have difficulty seeing, even when wearing glasses/contacts?: No Does the patient have difficulty concentrating, remembering, or making decisions?: No  Emotional Assessment Alcohol / Substance Use: Not Applicable Psych Involvement: No (comment)  Admission diagnosis:  Moderate persistent reactive airway disease with acute exacerbation [J45.41] Acute hypoxemic respiratory failure (HCC) [J96.01] Patient Active Problem List   Diagnosis Date Noted   Acute hypoxemic respiratory failure (HCC) 08/18/2023   Hypokalemia 06/29/2023   Hyponatremia 06/27/2023   Angioedema 02/01/2023   Chronic fatigue 09/11/2021   Shortness of breath at rest 08/17/2021   Moderate persistent asthma  05/06/2021   History of COVID-19 03/19/2021   COVID-19 long hauler manifesting chronic dyspnea 03/19/2021   Thyroid  disease    Stroke (HCC)    Hypertension    Hyperlipidemia    History of melanoma    Genital herpes    Depression    CKD (chronic kidney disease) stage 3, GFR 30-59 ml/min (HCC)    MCI (mild cognitive impairment) 10/01/2015   Falls frequently 10/01/2015   Essential hypertension, benign 11/14/2013   CVA (cerebral vascular accident) (HCC) 11/14/2013   Pure hypercholesterolemia 11/14/2013   TIA (transient ischemic attack) 10/02/2006   PCP:  Mordechai April, DO Pharmacy:   [facility] [place], [place] - [address] [facility] [address] [place] [address] Phone: [phone] Fax: [phone]  Social Drivers of Health (SDOH) Social History: SDOH Screenings   Food Insecurity: No Food Insecurity (08/18/2023)  Housing: Low Risk  (08/18/2023)  Transportation Needs: No Transportation Needs (08/18/2023)  Utilities: Not At Risk (08/18/2023)  Social Connections: Patient Declined (08/18/2023)  Tobacco Use: Low Risk  (06/27/2023)   SDOH Interventions:    Readmission Risk Interventions    08/19/2023   10:28 AM 06/29/2023    1:16 PM  Readmission Risk Prevention Plan  Transportation Screening Complete Complete  PCP or Specialist Appt within 5-7 Days  Complete  Home Care Screening  Complete  Medication Review (RN CM)  Complete  HRI or Home Care Consult Complete   Social Work Consult for Recovery Care Planning/Counseling Complete   Palliative Care Screening Not Applicable   Medication Review Oceanographer) Complete

## 2023-08-20 ENCOUNTER — Inpatient Hospital Stay (HOSPITAL_COMMUNITY)

## 2023-08-20 ENCOUNTER — Encounter (HOSPITAL_COMMUNITY): Payer: Self-pay | Admitting: Family Medicine

## 2023-08-20 DIAGNOSIS — R0609 Other forms of dyspnea: Secondary | ICD-10-CM

## 2023-08-20 DIAGNOSIS — J9601 Acute respiratory failure with hypoxia: Secondary | ICD-10-CM | POA: Diagnosis not present

## 2023-08-20 DIAGNOSIS — I10 Essential (primary) hypertension: Secondary | ICD-10-CM

## 2023-08-20 DIAGNOSIS — J4541 Moderate persistent asthma with (acute) exacerbation: Secondary | ICD-10-CM | POA: Diagnosis not present

## 2023-08-20 DIAGNOSIS — I5031 Acute diastolic (congestive) heart failure: Secondary | ICD-10-CM

## 2023-08-20 DIAGNOSIS — Z8673 Personal history of transient ischemic attack (TIA), and cerebral infarction without residual deficits: Secondary | ICD-10-CM

## 2023-08-20 LAB — BASIC METABOLIC PANEL WITH GFR
Anion gap: 10 (ref 5–15)
BUN: 34 mg/dL — ABNORMAL HIGH (ref 8–23)
CO2: 25 mmol/L (ref 22–32)
Calcium: 9 mg/dL (ref 8.9–10.3)
Chloride: 99 mmol/L (ref 98–111)
Creatinine, Ser: 1.69 mg/dL — ABNORMAL HIGH (ref 0.44–1.00)
GFR, Estimated: 32 mL/min — ABNORMAL LOW (ref >60)
Glucose, Bld: 149 mg/dL — ABNORMAL HIGH (ref 70–99)
Potassium: 4.3 mmol/L (ref 3.5–5.1)
Sodium: 134 mmol/L — ABNORMAL LOW (ref 135–145)

## 2023-08-20 LAB — ECHOCARDIOGRAM COMPLETE
Area-P 1/2: 4.68 cm2
Height: 60 in
MV M vel: 4.25 m/s
MV Peak grad: 72.3 mmHg
S' Lateral: 3.8 cm
Weight: 3389.79 [oz_av]

## 2023-08-20 MED ORDER — FUROSEMIDE 10 MG/ML IJ SOLN
80.0000 mg | Freq: Two times a day (BID) | INTRAMUSCULAR | Status: DC
  Administered 2023-08-21 – 2023-08-22 (×3): 80 mg via INTRAVENOUS
  Filled 2023-08-20 (×4): qty 8

## 2023-08-20 MED ORDER — HYDRALAZINE HCL 25 MG PO TABS
25.0000 mg | ORAL_TABLET | Freq: Three times a day (TID) | ORAL | Status: DC
  Administered 2023-08-21 – 2023-08-23 (×8): 25 mg via ORAL
  Filled 2023-08-20 (×10): qty 1

## 2023-08-20 MED ORDER — CLOPIDOGREL BISULFATE 75 MG PO TABS
75.0000 mg | ORAL_TABLET | Freq: Every day | ORAL | Status: DC
  Administered 2023-08-21 – 2023-08-23 (×3): 75 mg via ORAL
  Filled 2023-08-20 (×3): qty 1

## 2023-08-20 NOTE — Consult Note (Addendum)
 Cardiology Consultation  Patient ID: Becky [name] MRN: [id number]; DOB: 12-08-1950  Admit date: 08/18/2023 Date of Consult: 08/20/2023  PCP:  Mordechai April, DO   [facility] Providers Cardiologist:  Olinda Bertrand, DO  Electrophysiologist:  Will Cortland Ding, MD    Patient Profile: Becky [name] is a 73 y.o. female with a hx of hypertension, hyperlipidemia, history of CVA, CKD stage 3b, hypothyroidism, NASH, long COVID, asthma osteopenia, melanoma, depression, prolonged QT,  who is being seen 08/20/2023 for the evaluation of acute on chronic HFpEF at the request of Dr. Alfonse Angle.  History of Present Illness: Becky [name] has past medical history as stated above. He presented to [facility] on 08/18/2023 complaining of shortness of breath. She was sent from her PCP, Dr. Mordechai April at [place]. She presented for follow up and was noted to have dyspnea and hypoxia. She was given albuterol  nebulizer treatments while at her PCP but was noted to have increased work of breathing and SpO2 of 90% without a history of COPD. EMS was activated to take patient from PCP to ED. She also reports a weight gain of 12 lb in the past few weeks.   Relevant workup in the ED/since admission includes: BNP 233, CBC showed stable leukocytosis, negative respiratory panel, BMP showed creatinine elevation at 1.40 (baseline ~0.9) , CXR showed no acute airspace disease, cardiomegaly.   Since being admitted she has been started on IV Lasix, nebulizing treatments and most of her home medications. She received IV Lasix 40 mg x 1 followed by 20 mg PO x 1. Without impressive urinary response.  She follows with Dr. Albert Huff as well as EP as an outpatient. She saw Dr. Albert Huff last 05/2023 where she was complaining about dizziness and shortness of breath. His plan for her included Lexiscan  to evaluate for any ischemia, proBNP (545) and d-dimer (negative) along with an updated echocardiogram. Of note, she never got her  updated echocardiogram or Lexiscan  completed.   Her orthostatic vital signs were positive, and therefore she was instructed to take hydrochlorothiazide  every morning, and follow with her PCP to be evaluated for benign positional vertigo and also neurology to rule out peripheral/central etiologies. She followed with Mertha Abrahams, PA-C shortly after for prolonged QT. It was noted that her QT looked okay at this follow up, she was complaint with her nadolol  and no changes were made.  Her home medications include: hydrochlorothiazide  25 mg every morning, nadolol  20 mg daily, pravastatin  80 mg daily, reported to be taking ASA 650 mg nightly.   At my assessment she is seen with her sister and family.  SHe experiences shortness of breath and significant swelling in his legs, which has increased recently, although his weight has remained stable. No chest pain, shortness of breath, or palpitations. No current dizziness, which he experienced earlier this year.   Past Medical History:  Diagnosis Date   Chronic kidney disease    CVA (cerebral vascular accident) (HCC) 11/14/2013   Depression    followed by Dr Trilby Fujisawa frequently 10/01/2015   Genital herpes    History of melanoma    followed by derm Dr Joanne Muckle   Hyperlipidemia    Hypertension    MCI (mild cognitive impairment) 10/01/2015   Pure hypercholesterolemia 11/14/2013   Stroke [facility])    Old stroke seen on MRI 12/2009, carotid doppler w minimal plaque, MRI off the head otherwise neg for acute disease.   Thyroid  disease    hypothyroid. Followed by Dr  Deborra Falter   TIA (transient ischemic attack) 8/08   followed by neurologist Dr Janett Medin   Past Surgical History:  Procedure Laterality Date   ABDOMINAL HYSTERECTOMY     APPENDECTOMY     CHOLECYSTECTOMY     MELANOMA EXCISION     x2    Home Medications:  Prior to Admission medications   Medication Sig Start Date End Date Taking? Authorizing Provider  acyclovir (ZOVIRAX) 200 MG capsule Take 200 mg by  mouth as needed. 09/05/14  Yes [provider]  albuterol  (VENTOLIN  HFA) 108 (90 Base) MCG/ACT inhaler Inhale 1 puff into the lungs every 4 (four) hours as needed for wheezing or shortness of breath. 05/14/23  Yes [provider]  ALPRAZolam  (XANAX ) 0.25 MG tablet Take 0.25 mg by mouth 3 (three) times daily as needed for anxiety or sleep. 08/10/15  Yes [provider]  amLODipine  (NORVASC ) 10 MG tablet Take 10 mg by mouth daily. 07/08/23  Yes [provider]  ARIPiprazole  (ABILIFY ) 5 MG tablet Take 5 mg by mouth at bedtime. 05/14/23  Yes [provider]  aspirin  EC 325 MG tablet Take 650 mg by mouth at bedtime.   Yes [provider]  buPROPion (WELLBUTRIN XL) 150 MG 24 hr tablet Take 150 mg by mouth every morning. 07/01/23  Yes [provider]  desvenlafaxine  (PRISTIQ ) 25 MG 24 hr tablet Take 1 tablet (25 mg total) by mouth in the morning. 07/01/23  Yes Danford, Willis Harter, MD  Dexmethylphenidate  HCl 40 MG CP24 Take 1 capsule by mouth every morning. 05/20/23  Yes [provider]  dicyclomine  (BENTYL ) 20 MG tablet Take 1 tablet (20 mg total) by mouth 2 (two) times daily. 06/12/23  Yes Lyna Sandhoff, PA-C  fluticasone -salmeterol (ADVAIR ) 500-50 MCG/ACT AEPB Inhale 1 puff into the lungs in the morning and at bedtime. 08/04/23  Yes [provider]  furosemide (LASIX) 20 MG tablet Take 20 mg by mouth daily. 08/14/23  Yes [provider]  levothyroxine  (SYNTHROID ) 25 MCG tablet Take 25 mcg by mouth daily before breakfast.   Yes [provider]  nadolol  (CORGARD ) 20 MG tablet Take 1 tablet (20 mg total) by mouth daily. Patient needs appt for # 90 supply or future refills. Please call office to schedule appt. 07/16/22  Yes Camnitz, Babetta Lesch, MD  OVER THE COUNTER MEDICATION Take 1 tablet by mouth daily. Equate indigestion relief.   Yes [provider]  pramipexole  (MIRAPEX ) 0.25 MG tablet Take 0.5 mg by mouth at  bedtime. 12/26/15  Yes [provider]  pravastatin  (PRAVACHOL ) 80 MG tablet Take 80 mg by mouth at bedtime. 08/01/21  Yes [provider]  prochlorperazine  (COMPAZINE ) 5 MG tablet Take 1 tablet (5 mg total) by mouth every 6 (six) hours as needed for refractory nausea / vomiting. If promethazine  ineffective 07/01/23  Yes Danford, Willis Harter, MD  promethazine  (PHENERGAN ) 25 MG tablet Take 1 tablet (25 mg total) by mouth every 6 (six) hours as needed for nausea or vomiting. 06/12/23  Yes Lyna Sandhoff, PA-C  Tiotropium Bromide  Monohydrate (SPIRIVA  RESPIMAT) 1.25 MCG/ACT AERS Inhale 1 puff into the lungs daily. Patient taking differently: Inhale 1 puff into the lungs in the morning. 05/19/23  Yes Quillian Brunt, MD  traZODone  (DESYREL ) 50 MG tablet Take 50-150 mg by mouth at bedtime as needed for sleep. 03/20/23  Yes [provider]  hydrochlorothiazide  (HYDRODIURIL ) 25 MG tablet Take 25 mg by mouth every morning. Patient not taking: Reported on 08/18/2023 08/01/21  [provider]   Scheduled Meds:  ARIPiprazole   5 mg Oral QHS   buPROPion  150 mg Oral q morning   dicyclomine   20 mg Oral BID   enoxaparin  (LOVENOX ) injection  40 mg Subcutaneous QHS   fluticasone  furoate-vilanterol  1 puff Inhalation Daily   ipratropium-albuterol   3 mL Nebulization TID   levothyroxine   25 mcg Oral Q0600   methylPREDNISolone  (SOLU-MEDROL ) injection  40 mg Intravenous Q12H   nadolol   20 mg Oral Daily   pramipexole   0.5 mg Oral QHS   sodium chloride  flush  3 mL Intravenous Q12H   traZODone   100 mg Oral QHS   Continuous Infusions:  PRN Meds: acetaminophen  **OR** acetaminophen , ALPRAZolam , sodium chloride  flush  Allergies:    Allergies  Allergen Reactions   Penicillins Anaphylaxis   Telmisartan Other (See Comments)    Suspected cause of angioedema    Zofran  [Ondansetron ]    Iodinated Contrast Media Hives   Iohexol  Other (See Comments)     Desc: iodine    Latex Rash    Nsaids Other (See Comments)    CKD    Social History:   Social History   Socioeconomic History   Marital status: Single    Spouse name: Not on file   Number of children: Not on file   Years of education: Not on file   Highest education level: Not on file  Occupational History   Not on file  Tobacco Use   Smoking status: Never   Smokeless tobacco: Never  Vaping Use   Vaping status: Never Used  Substance and Sexual Activity   Alcohol use: No   Drug use: No   Sexual activity: Never  Other Topics Concern   Not on file  Social History Narrative   Not on file   Social Drivers of Health   Financial Resource Strain: Not on file  Food Insecurity: No Food Insecurity (08/18/2023)   Hunger Vital Sign    Worried About Running Out of Food in the Last Year: Never true    Ran Out of Food in the Last Year: Never true  Transportation Needs: No Transportation Needs (08/18/2023)   PRAPARE - Administrator, Civil Service (Medical): No    Lack of Transportation (Non-Medical): No  Physical Activity: Not on file  Stress: Not on file  Social Connections: Patient Declined (08/18/2023)   Social Connection and Isolation Panel    Frequency of Communication with Friends and Family: Patient declined    Frequency of Social Gatherings with Friends and Family: Patient declined    Attends Religious Services: Patient declined    Database administrator or Organizations: Patient declined    Attends Banker Meetings: Patient declined    Marital Status: Patient declined  Intimate Partner Violence: Not At Risk (08/18/2023)   Humiliation, Afraid, Rape, and Kick questionnaire    Fear of Current or Ex-Partner: No    Emotionally Abused: No    Physically Abused: No    Sexually Abused: No    Family History:   Family History  Problem Relation Age of Onset   Breast cancer Mother    Hyperlipidemia Father    Hypertension Father    Heart attack Father    Hypertension Sister     Hyperlipidemia Sister    Dementia Maternal Aunt     ROS:  Please see the history of present illness.  All other ROS reviewed and negative.     Physical Exam/Data: Vitals:  August 24, 2023 1943 08/20/23 0413 08/20/23 0816 08/20/23 0817  BP:  (!) 159/81    Pulse:  62    Resp:  18    Temp:  (!) 97.5 F (36.4 C)    TempSrc:  Oral    SpO2: 94% 97% 91% 91%  Weight:  96.1 kg    Height:       Intake/Output Summary (Last 24 hours) at 08/20/2023 1105 Last data filed at 08/20/2023 0859 Gross per 24 hour  Intake 1083 ml  Output 1200 ml  Net -117 ml      08/20/2023    4:13 AM 08/18/2023    6:23 PM 08/18/2023   12:54 PM  Last 3 Weights  Weight (lbs) 211 lb 13.8 oz 215 lb 13.3 oz 213 lb  Weight (kg) 96.1 kg 97.9 kg 96.616 kg     Body mass index is 41.38 kg/m.   General:  Morbid Obesity on 2 L Neck: Thick neck Cardiac:  normal S1, S2; RRR; distant heart sounds Lungs:  decreased breath sounds in bases bilaterally, no wheezing, rhonchi or rales  Abd: soft, nontender, fluid wave Ext: non pitting edema Musculoskeletal:  No deformities, BUE and BLE strength normal and equal Skin: warm and dry  Neuro:  CNs 2-12 intact, no focal abnormalities noted Psych:  Normal affect   EKG:  The EKG was personally reviewed and demonstrates:  SR Telemetry:  Telemetry was personally reviewed and demonstrates:  SR to Sinus bradycardia- Type II block is inaccurate  Relevant CV Studies:  LABS Creatinine: 1.34 (August 24, 2023) Creatinine: 1.64 (08/20/2023)  RADIOLOGY CT scan: Aortic atherosclerosis, faint calcification in left anterior descending artery (2023)  DIAGNOSTIC Stress test: No evidence of ischemia (2021) Echocardiogram: Normal cardiac function, no evidence of congestive heart failure (2023) Sleep study: Severe obstructive sleep apnea (2023) AHI 81  Echocardiogram, 2021-08-23 Left ventricular ejection fraction, by estimation, is 65 to 70% . The left ventricle has normal function. The left  ventricle has no regional wall motion abnormalities. There is mild concentric left ventricular hypertrophy. Left ventricular diastolic parameters are consistent with Grade I diastolic dysfunction ( impaired relaxation) . Elevated left ventricular end- diastolic pressure.  Right ventricular systolic function is normal. The right ventricular size is normal. There is normal pulmonary artery systolic pressure.  The mitral valve is normal in structure. Trivial mitral valve regurgitation. No evidence of mitral stenosis.  The aortic valve is normal in structure. Aortic valve regurgitation is not visualized. No aortic stenosis is present.  The inferior vena cava is normal in size with greater than 50% respiratory variability, suggesting right atrial pressure of 3 mmHg.  Lexiscan  10/06/2018 The left ventricular ejection fraction is hyperdynamic (>65%). Nuclear stress EF: 67%. There was no ST segment deviation noted during stress. The study is normal. This is a low risk study. No prior study for comparison.  Laboratory Data: High Sensitivity Troponin:  No results for input(s): TROPONINIHS in the last 720 hours.   Chemistry Recent Labs  Lab 08/18/23 1304 08/24/23 0359 08/20/23 0406  NA 140 138 134*  K 3.2* 4.6 4.3  CL 103 100 99  CO2 26 26 25   GLUCOSE 102* 158* 149*  BUN 22 28* 34*  CREATININE 1.40* 1.40* 1.69*  CALCIUM 8.8* 9.4 9.0  GFRNONAA 40* 40* 32*  ANIONGAP 11 12 10     Recent Labs  Lab 2023-08-24 0359  PROT 7.5  ALBUMIN 4.1  AST 19  ALT 18  ALKPHOS 85  BILITOT 0.6     Hematology Recent Labs  Lab 08/18/23 1304 08/19/23 0359  WBC 11.6* 14.2*  RBC 4.10 4.68  HGB 12.0 13.8  HCT 38.1 44.2  MCV 92.9 94.4  MCH 29.3 29.5  MCHC 31.5 31.2  RDW 13.3 13.2  PLT 295 309    BNP Recent Labs  Lab 08/18/23 1304  BNP 233.2*      Radiology/Studies:  DG Chest Port 1 View Result Date: 08/18/2023 CLINICAL DATA:  Short of breath, hypoxia EXAM: PORTABLE CHEST 1 VIEW COMPARISON:   07/01/2023 FINDINGS: Single frontal view of the chest demonstrates an enlarged cardiac silhouette. No acute airspace disease, effusion, or pneumothorax. No acute bony abnormalities. IMPRESSION: 1. Enlarged cardiac silhouette. 2. No acute airspace disease. Electronically Signed   By: Bobbye Burrow M.D.   On: 08/18/2023 14:37   Assessment and Plan:  Acute HFpEF Complicated by hyponatermia, AKI, and edema - Significant leg swelling. Increased furosemide dose to manage fluid overload. Echocardiogram ordered to assess heart function. Fluid removal expected to improve symptoms, including dyspnea, and kidney function. - Increase furosemide dose to 80 mg IV BID - PM echocardiogram to assess heart function.  Hypertension Blood pressure is elevated. Amlodipine  is discontinued during hospitalization due to potential contribution to leg swelling. A new antihypertensive medication is initiated to manage blood pressure without affecting kidney function, temporarily addressing fluid overload. - Discontinue amlodipine  during hospitalization. - Administer new antihypertensive hydralazine  25 mg PO three times a day; when AKI improves will transition to appropriate therapy based on echo  Hx Stroke and TIA TIA in 2016 and stroke in 2019. Current aspirin  dose is high. Plan to switch to clopidogrel for antiplatelet therapy due to cost-effectiveness and reduced bleeding risk, providing adequate stroke prevention while minimizing side effects. - Switch from aspirin  to clopidogrel for antiplatelet therapy.  Aortic atherosclerosis Mild aortic atherosclerosis noted in previous cardiac workup. Current cholesterol management is adequate. Further management to be addressed in outpatient setting. Long-term goal is to reduce cardiovascular risk. - will resume outpatient pravastatin  80 mg at DC  Morbid obesity with no MEN2a or Medullary thyroid  carcinoma hx Obstructive sleep apnea - Severe obstructive sleep apnea  diagnosed in 2023. CPAP not tolerated. Consideration for alternative treatments in the future. Repeating a sleep study may provide additional management options. - Consider repeating sleep study to explore alternative treatments such as GLP-1 therapy or Inspire  Prolonged Qtc - home Nadolol   Rest as per primary   Risk Assessment/Risk Scores:      [place]  Heart Association (NYHA) Functional Class NYHA Class IV  For questions or updates, please contact  Heart[facility] Please consult [email or web address] for contact info under    Gloriann Larger, MD FASE [facility] Cardiologist [facility]  [address] [place], [place] [address] [phone]  12:49 PM

## 2023-08-20 NOTE — TOC Progression Note (Signed)
 Transition of Care [facility]) - Progression Note   Patient Details  Name: Becky [name] MRN: [id number] Date of Birth: 20-May-1950  Transition of Care [facility] CM/SW Contact  Zenon Hilda, LCSW Phone Number: 08/20/2023, 12:39 PM  Clinical Narrative: Patient is active with [facility] for HHPT/OT. PT evaluation recommended HH. CSW notified patient PT recommended resuming HH services and patient is agreeable. Patient reported she has a cane at home that she uses. HH orders requested. CSW updated Shelvy Dickens with [facility]. Patient still requiring 3L/min oxygen.  Expected Discharge Plan: Home w Home Health Services Barriers to Discharge: Continued Medical Work up  Expected Discharge Plan and Services In-house Referral: Clinical Social Work Post Acute Care Choice: Home Health Living arrangements for the past 2 months: Apartment HH Agency: Other - See comment Producer, television/film/video) Date HH Agency Contacted: 08/20/23 Time HH Agency Contacted: 1131 Representative spoke with at [facility] Agency: Shelvy Dickens  Social Determinants of Health (SDOH) Interventions SDOH Screenings   Food Insecurity: No Food Insecurity (08/18/2023)  Housing: Low Risk  (08/18/2023)  Transportation Needs: No Transportation Needs (08/18/2023)  Utilities: Not At Risk (08/18/2023)  Social Connections: Patient Declined (08/18/2023)  Tobacco Use: Low Risk  (08/20/2023)   Readmission Risk Interventions    08/20/2023   12:36 PM 08/19/2023   10:28 AM 06/29/2023    1:16 PM  Readmission Risk Prevention Plan  Transportation Screening Complete Complete Complete  PCP or Specialist Appt within 5-7 Days   Complete  Home Care Screening   Complete  Medication Review (RN CM)   Complete  HRI or Home Care Consult Complete Complete   Social Work Consult for Recovery Care Planning/Counseling Complete Complete   Palliative Care Screening Complete Not Applicable   Medication Review Oceanographer) Complete Complete

## 2023-08-20 NOTE — Progress Notes (Signed)
 Echocardiogram 2D Echocardiogram has been performed.  Farley Honer, RDCS 08/20/2023, 2:57 PM

## 2023-08-20 NOTE — Progress Notes (Addendum)
 [facility]  PROGRESS NOTE  Becky [name] JYN:[id number] DOB: [birth year] DOA: 08/18/2023 PCP: Mordechai April, DO   Brief HPI:   73 y.o. female, with history of hypertension, TIA, hyperlipidemia, CVA, melanoma, CKD stage IIIb, hypothyroidism, depression, NASH, long COVID syndrome, osteopenia.  Patient says that she has been having shortness of breath for past few days and also has bilateral leg swelling, has gained 12 pounds weight over past 1 month.  Her PCP had put her on Lasix 20 mg daily.  Today she went to PCP office, she was given Solu-Medrol , magnesium  and nebulizer with some improvement but remained short of breath.  She was sent to ED for further evaluation.  Patient O2 sats was 88% on room air per EMS.     Assessment/Plan:   Acute hypoxemic respiratory failure-multifactorial in setting of long COVID, diastolic heart failure.  Patient's last echo from 2023 showed EF of 65 to 70% with grade 1 diastolic dysfunction.  She was given 1 dose of Lasix 40 mg IV, did not have much urine output.  Start DuoNeb nebulizers every 6 hours, will start Solu-Medrol  40 mg IV every 12 hours.  Pulse ox on ambulation showed 91% on room air.  Acute on chronic diastolic heart failure-patient has gained 12 pounds over past few weeks, has bilateral lower extremity edema.  Lasix given as above. BNP 233.  Lasix currently held due to worsening renal function.  Will consult cardiology for further help with diuresis.  Hypothyroidism-continue Synthroid   Hyperlipidemia-continue pravastatin   Depression-continue Abilify , bupropion  Acute on CKD stage IIIb-creatinine is worse with diuresis.  Lasix on hold.  Cardiology consulted.  Follow renal function in AM.    Medications     ARIPiprazole   5 mg Oral QHS   buPROPion  150 mg Oral q morning   dicyclomine   20 mg Oral BID   enoxaparin  (LOVENOX ) injection  40 mg Subcutaneous QHS   fluticasone  furoate-vilanterol  1 puff Inhalation Daily    ipratropium-albuterol   3 mL Nebulization TID   levothyroxine   25 mcg Oral Q0600   methylPREDNISolone  (SOLU-MEDROL ) injection  40 mg Intravenous Q12H   nadolol   20 mg Oral Daily   pramipexole   0.5 mg Oral QHS   sodium chloride  flush  3 mL Intravenous Q12H   traZODone   100 mg Oral QHS     Data Reviewed:   CBG:  No results for input(s): GLUCAP in the last 168 hours.  SpO2: 97 % O2 Flow Rate (L/min): 2 L/min    Vitals:   08/19/23 1442 08/19/23 1936 08/19/23 1943 08/20/23 0413  BP:  133/61  (!) 159/81  Pulse:  75  62  Resp:  20  18  Temp:  99.1 F (37.3 C)  (!) 97.5 F (36.4 C)  TempSrc:  Oral  Oral  SpO2: 94% 95% 94% 97%  Weight:    96.1 kg  Height:          Data Reviewed:  Basic Metabolic Panel: Recent Labs  Lab 08/18/23 1304 08/19/23 0359 08/20/23 0406  NA 140 138 134*  K 3.2* 4.6 4.3  CL 103 100 99  CO2 26 26 25   GLUCOSE 102* 158* 149*  BUN 22 28* 34*  CREATININE 1.40* 1.40* 1.69*  CALCIUM 8.8* 9.4 9.0    CBC: Recent Labs  Lab 08/18/23 1304 08/19/23 0359  WBC 11.6* 14.2*  NEUTROABS 8.2*  --   HGB 12.0 13.8  HCT 38.1 44.2  MCV 92.9 94.4  PLT 295 309    LFT  Recent Labs  Lab 08/19/23 0359  AST 19  ALT 18  ALKPHOS 85  BILITOT 0.6  PROT 7.5  ALBUMIN 4.1     Antibiotics: Anti-infectives (From admission, onward)    Start     Dose/Rate Route Frequency Ordered Stop   08/18/23 1600  levofloxacin (LEVAQUIN) IVPB 500 mg        500 mg 100 mL/hr over 60 Minutes Intravenous  Once 08/18/23 1547 08/18/23 1751        DVT prophylaxis: Lovenox   Code Status: Full code  Family Communication:    CONSULTS    Subjective    Still has shortness of breath, lower extremity swelling.  Renal function getting worse after getting Lasix.  Objective    Physical Examination:  General-appears in no acute distress Heart-S1-S2, regular, no murmur auscultated Lungs-clear to auscultation bilaterally, no wheezing or crackles  auscultated Abdomen-soft, nontender, no organomegaly Extremities-bilateral 2+  edema in the lower extremities Neuro-alert, oriented x3, no focal deficit noted   Status is: Inpatient:             Becky [name]   [facility] If 7PM-7AM, please contact night-coverage at [email or web address], Office  [phone]   08/20/2023, 8:01 AM  LOS: 2 days

## 2023-08-21 DIAGNOSIS — J9601 Acute respiratory failure with hypoxia: Secondary | ICD-10-CM | POA: Diagnosis not present

## 2023-08-21 LAB — BASIC METABOLIC PANEL WITH GFR
Anion gap: 11 (ref 5–15)
BUN: 41 mg/dL — ABNORMAL HIGH (ref 8–23)
CO2: 28 mmol/L (ref 22–32)
Calcium: 9.5 mg/dL (ref 8.9–10.3)
Chloride: 99 mmol/L (ref 98–111)
Creatinine, Ser: 1.38 mg/dL — ABNORMAL HIGH (ref 0.44–1.00)
GFR, Estimated: 41 mL/min — ABNORMAL LOW (ref >60)
Glucose, Bld: 154 mg/dL — ABNORMAL HIGH (ref 70–99)
Potassium: 3.8 mmol/L (ref 3.5–5.1)
Sodium: 138 mmol/L (ref 135–145)

## 2023-08-21 LAB — CULTURE, BLOOD (ROUTINE X 2)

## 2023-08-21 MED ORDER — SPIRONOLACTONE 25 MG PO TABS
25.0000 mg | ORAL_TABLET | Freq: Every day | ORAL | Status: DC
  Administered 2023-08-21 – 2023-08-23 (×3): 25 mg via ORAL
  Filled 2023-08-21 (×3): qty 1

## 2023-08-21 MED ORDER — SODIUM CHLORIDE 0.9% FLUSH
10.0000 mL | Freq: Two times a day (BID) | INTRAVENOUS | Status: DC
  Administered 2023-08-21 – 2023-08-23 (×4): 10 mL

## 2023-08-21 MED ORDER — MENTHOL 3 MG MT LOZG
1.0000 | LOZENGE | OROMUCOSAL | Status: DC | PRN
  Administered 2023-08-21: 3 mg via ORAL
  Filled 2023-08-21: qty 9

## 2023-08-21 MED ORDER — PRAVASTATIN SODIUM 40 MG PO TABS
80.0000 mg | ORAL_TABLET | Freq: Every day | ORAL | Status: DC
  Administered 2023-08-21 – 2023-08-22 (×2): 80 mg via ORAL
  Filled 2023-08-21 (×2): qty 2

## 2023-08-21 MED ORDER — SODIUM CHLORIDE 0.9% FLUSH: 10.0000 mL | INTRAVENOUS | Status: DC | PRN

## 2023-08-21 NOTE — Progress Notes (Signed)
 Physical Therapy Treatment Patient Details Name: Becky [name] MRN: [id number] DOB: [birth year] Today's Date: 08/21/2023   History of Present Illness 73 yo female admitted wtih acute respiratory failure, HF. Hx of TIA, CVA, CKD, hypothyroidism, CKD, NASH    PT Comments  Pt assisted with ambulating in hallway and able to improve distance today.  Pt also used baseline SPC from home.  SpO2 95-98% on room air during session so RN notified and okay with leaving off O2 [place].  Current d/c plan to return home with HHPT.  Pt very agreeable to ambulate again today with mobility specialist - will request MS to see.     If plan is discharge home, recommend the following: A little help with walking and/or transfers;A little help with bathing/dressing/bathroom;Assistance with cooking/housework;Assist for transportation;Help with stairs or ramp for entrance   Can travel by private vehicle        Equipment Recommendations  None recommended by PT    Recommendations for Other Services       Precautions / Restrictions Precautions Precautions: Fall Precaution/Restrictions Comments: monitor SpO2     Mobility  Bed Mobility Overal bed mobility: Modified Independent                  Transfers Overall transfer level: Needs assistance Equipment used: None Transfers: Sit to/from Stand Sit to Stand: Supervision           General transfer comment: Cues for safety    Ambulation/Gait Ambulation/Gait assistance: Contact guard assist, Min assist Gait Distance (Feet): 140 Feet Assistive device: Straight cane Gait Pattern/deviations: Step-through pattern, Decreased stride length Gait velocity: decr     General Gait Details: slow but overall steady with SPC until turning back into room (had LOB but able to self correct with door frame); SpO2 95-98% on room air   Stairs             Wheelchair Mobility     Tilt Bed    Modified Rankin (Stroke Patients Only)        Balance Overall balance assessment: Needs assistance, Mild deficits observed, not formally tested         Standing balance support: No upper extremity supported Standing balance-Leahy Scale: Fair Standing balance comment: static fair                            Communication Communication Communication: No apparent difficulties  Cognition Arousal: Alert Behavior During Therapy: WFL for tasks assessed/performed   PT - Cognitive impairments: No apparent impairments                         Following commands: Intact      Cueing Cueing Techniques: Verbal cues  Exercises      General Comments        Pertinent Vitals/Pain Pain Assessment Pain Assessment: No/denies pain    Home Living                          Prior Function            PT Goals (current goals can now be found in the care plan section) Progress towards PT goals: Progressing toward goals    Frequency    Min 3X/week      PT Plan      Co-evaluation              AM-PAC PT  6 Clicks Mobility   Outcome Measure  Help needed turning from your back to your side while in a flat bed without using bedrails?: None Help needed moving from lying on your back to sitting on the side of a flat bed without using bedrails?: None Help needed moving to and from a bed to a chair (including a wheelchair)?: A Little Help needed standing up from a chair using your arms (e.g., wheelchair or bedside chair)?: A Little Help needed to walk in hospital room?: A Little Help needed climbing 3-5 steps with a railing? : A Little 6 Click Score: 20    End of Session Equipment Utilized During Treatment: Gait belt Activity Tolerance: Patient tolerated treatment well Patient left: in bed;with call bell/phone within reach Nurse Communication: Mobility status PT Visit Diagnosis: Difficulty in walking, not elsewhere classified (R26.2)     Time: 1610-9604 PT Time Calculation (min) (ACUTE  ONLY): 13 min  Charges:    $Gait Training: 8-22 mins PT General Charges $$ ACUTE PT VISIT: 1 Visit                    Blanch Bunde, DPT Physical Therapist Acute Rehabilitation Services Office: [phone]    [name] Payson 08/21/2023, 3:38 PM

## 2023-08-21 NOTE — Progress Notes (Signed)
 Mobility Specialist - Progress Note  (RA) Pre-mobility: 63 bpm HR, 64% SpO2 During mobility: 71 bpm HR, 90% SpO2 Post-mobility: 62 bpm HR, 92% SPO2   08/21/23 1443  Mobility  Activity Ambulated with assistance in hallway  Level of Assistance Contact guard assist, steadying assist  Assistive Device Cane  Distance Ambulated (ft) 150 ft  Range of Motion/Exercises Active  Activity Response Tolerated well  Mobility Referral Yes  Mobility visit 1 Mobility  Mobility Specialist Start Time (ACUTE ONLY) 1430  Mobility Specialist Stop Time (ACUTE ONLY) 1443  Mobility Specialist Time Calculation (min) (ACUTE ONLY) 13 min   Pt was found in bed and agreeable to ambulate. Grew fatigued with session. At EOS returned to use bathroom and understood to pull call bell when finished.   Lorna Rose,  Mobility Specialist Can be reached via Secure Chat

## 2023-08-21 NOTE — Progress Notes (Signed)

## 2023-08-21 NOTE — Progress Notes (Signed)
 [facility]  PROGRESS NOTE  Becky [name] RUE:[id number] DOB: 06/01/1950 DOA: 08/18/2023 PCP: Mordechai April, DO   Brief HPI:   73 y.o. female, with history of hypertension, TIA, hyperlipidemia, CVA, melanoma, CKD stage IIIb, hypothyroidism, depression, NASH, long COVID syndrome, osteopenia.  Patient says that she has been having shortness of breath for past few days and also has bilateral leg swelling, has gained 12 pounds weight over past 1 month.  Her PCP had put her on Lasix 20 mg daily.  Today she went to PCP office, she was given Solu-Medrol , magnesium  and nebulizer with some improvement but remained short of breath.  She was sent to ED for further evaluation.  Patient O2 sats was 88% on room air per EMS.     Assessment/Plan:   Acute hypoxemic respiratory failure-multifactorial in setting of long COVID, diastolic heart failure.  Patient's last echo from 2023 showed EF of 65 to 70% with grade 1 diastolic dysfunction.    Started on high-dose Lasix 80 mg IV twice daily per cardiology.  Diuresing very well.    COPD exacerbation-  Started on  DuoNeb nebulizers every 6 hours, continue Solu-Medrol  40 mg IV every 12 hours.  Pulse ox on ambulation showed 91% on room air.  Acute on chronic diastolic heart failure-patient has gained 12 pounds over past few weeks, has bilateral lower extremity edema.  Started on Lasix but developed renal insufficiency.  Cardiology consulted for helping with diuresis.  Started on high-dose Lasix 80 IV twice daily with good diuresis.  Will continue with Lasix 80 IV twice daily for 1 more day, likely to change to Lasix 40 mg p.o. daily from tomorrow morning. Repeat echo from yesterday shows EF of 70 to 75%.  Hypothyroidism-continue Synthroid   Hyperlipidemia-continue pravastatin   Depression-continue Abilify , bupropion  Acute on CKD stage IIIb-creatinine is improving with diuresis.  Today creatinine is 1.38 .  Follow renal function in  a.m.   Medications     ARIPiprazole   5 mg Oral QHS   buPROPion  150 mg Oral q morning   clopidogrel  75 mg Oral Daily   dicyclomine   20 mg Oral BID   enoxaparin  (LOVENOX ) injection  40 mg Subcutaneous QHS   fluticasone  furoate-vilanterol  1 puff Inhalation Daily   furosemide  80 mg Intravenous BID   hydrALAZINE   25 mg Oral Q8H   ipratropium-albuterol   3 mL Nebulization TID   levothyroxine   25 mcg Oral Q0600   methylPREDNISolone  (SOLU-MEDROL ) injection  40 mg Intravenous Q12H   nadolol   20 mg Oral Daily   pramipexole   0.5 mg Oral QHS   sodium chloride  flush  3 mL Intravenous Q12H   traZODone   100 mg Oral QHS     Data Reviewed:   CBG:  No results for input(s): GLUCAP in the last 168 hours.  SpO2: 95 % O2 Flow Rate (L/min): 3 L/min FiO2 (%): 32 %    Vitals:   08/20/23 1823 08/20/23 1938 08/21/23 0204 08/21/23 0533  BP:  135/67 (!) 149/72   Pulse:  75 62   Resp:  20 20   Temp:  98.6 F (37 C) 98.6 F (37 C)   TempSrc:  Oral Oral   SpO2: 92% 93% 95%   Weight:    98 kg  Height:          Data Reviewed:  Basic Metabolic Panel: Recent Labs  Lab 08/18/23 1304 08/19/23 0359 08/20/23 0406 08/21/23 0345  NA 140 138 134* 138  K 3.2* 4.6 4.3  3.8  CL 103 100 99 99  CO2 26 26 25 28   GLUCOSE 102* 158* 149* 154*  BUN 22 28* 34* 41*  CREATININE 1.40* 1.40* 1.69* 1.38*  CALCIUM 8.8* 9.4 9.0 9.5    CBC: Recent Labs  Lab 08/18/23 1304 08/19/23 0359  WBC 11.6* 14.2*  NEUTROABS 8.2*  --   HGB 12.0 13.8  HCT 38.1 44.2  MCV 92.9 94.4  PLT 295 309    LFT Recent Labs  Lab 08/19/23 0359  AST 19  ALT 18  ALKPHOS 85  BILITOT 0.6  PROT 7.5  ALBUMIN 4.1     Antibiotics: Anti-infectives (From admission, onward)    Start     Dose/Rate Route Frequency Ordered Stop   08/18/23 1600  levofloxacin (LEVAQUIN) IVPB 500 mg        500 mg 100 mL/hr over 60 Minutes Intravenous  Once 08/18/23 1547 08/18/23 1751        DVT prophylaxis: Lovenox   Code  Status: Full code  Family Communication:    CONSULTS cardiology   Subjective   Diuresed very well with IV Lasix 80 mg twice daily given yesterday.  No longer requiring oxygen this morning.  Complains of dry cough.   Objective    Physical Examination:  General-appears in no acute distress Heart-S1-S2, regular, no murmur auscultated Lungs-clear to auscultation bilaterally, no wheezing or crackles auscultated Abdomen-soft, nontender, no organomegaly Extremities-no edema in the lower extremities Neuro-alert, oriented x3, no focal deficit noted  Status is: Inpatient:             Becky [name]   [facility] If 7PM-7AM, please contact night-coverage at [email or web address], Office  [phone]   08/21/2023, 8:14 AM  LOS: 3 days

## 2023-08-21 NOTE — Progress Notes (Signed)
 Progress Note  Patient Name: Becky [name] Date of Encounter: 08/21/2023 Primary Cardiologist: Olinda Bertrand, DO   Subjective   Overnight echo showed no decrease in function. Patient notes that she feels better.  Still has some tightness when she is off of O2.  Vital Signs    Vitals:   08/20/23 1823 08/20/23 1938 08/21/23 0204 08/21/23 0533  BP:  135/67 (!) 149/72   Pulse:  75 62   Resp:  20 20   Temp:  98.6 F (37 C) 98.6 F (37 C)   TempSrc:  Oral Oral   SpO2: 92% 93% 95%   Weight:    98 kg  Height:        Intake/Output Summary (Last 24 hours) at 08/21/2023 0812 Last data filed at 08/21/2023 0216 Gross per 24 hour  Intake 843 ml  Output 2300 ml  Net -1457 ml   Filed Weights   08/18/23 1823 08/20/23 0413 08/21/23 0533  Weight: 97.9 kg 96.1 kg 98 kg    Physical Exam   GEN: No acute distress.   Cardiac: Regular bradycardia, no murmurs, rubs, or gallops.  Respiratory: Improved basilar breath sounds, mild tachypnea GI: Soft, nontender, non-distended  MS: Non pitting edema  Labs   Telemetry: Sinus bradycardia   Chemistry Recent Labs  Lab 08/19/23 0359 08/20/23 0406 08/21/23 0345  NA 138 134* 138  K 4.6 4.3 3.8  CL 100 99 99  CO2 26 25 28   GLUCOSE 158* 149* 154*  BUN 28* 34* 41*  CREATININE 1.40* 1.69* 1.38*  CALCIUM 9.4 9.0 9.5  PROT 7.5  --   --   ALBUMIN 4.1  --   --   AST 19  --   --   ALT 18  --   --   ALKPHOS 85  --   --   BILITOT 0.6  --   --   GFRNONAA 40* 32* 41*  ANIONGAP 12 10 11      Hematology Recent Labs  Lab 08/18/23 1304 08/19/23 0359  WBC 11.6* 14.2*  RBC 4.10 4.68  HGB 12.0 13.8  HCT 38.1 44.2  MCV 92.9 94.4  MCH 29.3 29.5  MCHC 31.5 31.2  RDW 13.3 13.2  PLT 295 309    BNP Recent Labs  Lab 08/18/23 1304  BNP 233.2*     Cardiac Studies   Cardiac Studies & Procedures   ______________________________________________________________________________________________   STRESS TESTS  MYOCARDIAL  PERFUSION IMAGING 10/06/2018  Narrative  The left ventricular ejection fraction is hyperdynamic (>65%).  Nuclear stress EF: 67%.  There was no ST segment deviation noted during stress.  The study is normal.  This is a low risk study.  No prior study for comparison.   ECHOCARDIOGRAM  ECHOCARDIOGRAM COMPLETE 08/20/2023  Narrative ECHOCARDIOGRAM REPORT    Patient Name:   Becky [name] Date of Exam: 08/20/2023 Medical Rec #:  [id number]            Height:       60.0 in Accession #:    [id number]           Weight:       211.9 lb Date of Birth:  1950-09-22            BSA:          1.913 m Patient Age:    73 years             BP:           159/81  mmHg Patient Gender: F                    HR:           64 bpm. Exam Location:  Inpatient  Procedure: 2D Echo, Cardiac Doppler and Color Doppler (Both Spectral and Color Flow Doppler were utilized during procedure).  Indications:    Dyspnea R06.00  History:        Patient has prior history of Echocardiogram examinations, most recent 08/18/2021. Chronic Kidney Disease, Signs/Symptoms:Shortness of Breath and Dyspnea; Risk Factors:Hypertension and Dyslipidemia.  Sonographer:    Kip Peon RDCS Referring Phys: [id number] TAYLOR A PARCELLS  IMPRESSIONS   1. Left ventricular ejection fraction, by estimation, is 70 to 75%. The left ventricle has hyperdynamic function. The left ventricle has no regional wall motion abnormalities. There is mild concentric left ventricular hypertrophy. Left ventricular diastolic parameters are consistent with Grade II diastolic dysfunction (pseudonormalization). 2. Right ventricular systolic function is normal. The right ventricular size is normal. There is mildly elevated pulmonary artery systolic pressure. The estimated right ventricular systolic pressure is 43.0 mmHg. 3. Left atrial size was mildly dilated. 4. The mitral valve is normal in structure. Trivial mitral valve regurgitation. No evidence of  mitral stenosis. 5. The aortic valve is tricuspid. There is mild calcification of the aortic valve. Aortic valve regurgitation is not visualized. No aortic stenosis is present. 6. The inferior vena cava is normal in size with <50% respiratory variability, suggesting right atrial pressure of 8 mmHg.  FINDINGS Left Ventricle: Left ventricular ejection fraction, by estimation, is 70 to 75%. The left ventricle has hyperdynamic function. The left ventricle has no regional wall motion abnormalities. The left ventricular internal cavity size was normal in size. There is mild concentric left ventricular hypertrophy. Left ventricular diastolic parameters are consistent with Grade II diastolic dysfunction (pseudonormalization).  Right Ventricle: The right ventricular size is normal. No increase in right ventricular wall thickness. Right ventricular systolic function is normal. There is mildly elevated pulmonary artery systolic pressure. The tricuspid regurgitant velocity is 2.96 m/s, and with an assumed right atrial pressure of 8 mmHg, the estimated right ventricular systolic pressure is 43.0 mmHg.  Left Atrium: Left atrial size was mildly dilated.  Right Atrium: Right atrial size was normal in size.  Pericardium: There is no evidence of pericardial effusion.  Mitral Valve: The mitral valve is normal in structure. Trivial mitral valve regurgitation. No evidence of mitral valve stenosis.  Tricuspid Valve: The tricuspid valve is normal in structure. Tricuspid valve regurgitation is trivial. No evidence of tricuspid stenosis.  Aortic Valve: The aortic valve is tricuspid. There is mild calcification of the aortic valve. Aortic valve regurgitation is not visualized. No aortic stenosis is present.  Pulmonic Valve: The pulmonic valve was normal in structure. Pulmonic valve regurgitation is not visualized. No evidence of pulmonic stenosis.  Aorta: The aortic root is normal in size and structure.  Venous:  The inferior vena cava is normal in size with less than 50% respiratory variability, suggesting right atrial pressure of 8 mmHg.  IAS/Shunts: No atrial level shunt detected by color flow Doppler.   LEFT VENTRICLE PLAX 2D LVIDd:         5.90 cm   Diastology LVIDs:         3.80 cm   LV e' medial:    5.43 cm/s LV PW:         1.10 cm   LV E/e' medial:  19.5 LV IVS:  1.10 cm   LV e' lateral:   4.26 cm/s LVOT diam:     2.10 cm   LV E/e' lateral: 24.9 LV SV:         92 LV SV Index:   48 LVOT Area:     3.46 cm   RIGHT VENTRICLE             IVC RV S prime:     15.30 cm/s  IVC diam: 1.90 cm TAPSE (M-mode): 2.1 cm  LEFT ATRIUM             Index LA diam:        4.80 cm 2.51 cm/m LA Vol (A2C):   42.6 ml 22.27 ml/m LA Vol (A4C):   52.1 ml 27.24 ml/m LA Biplane Vol: 47.9 ml 25.04 ml/m AORTIC VALVE LVOT Vmax:   141.00 cm/s LVOT Vmean:  84.500 cm/s LVOT VTI:    0.267 m  AORTA Ao Root diam: 3.30 cm Ao Asc diam:  3.50 cm  MITRAL VALVE                TRICUSPID VALVE MV Area (PHT): 4.68 cm     TR Peak grad:   35.0 mmHg MV Decel Time: 162 msec     TR Vmax:        296.00 cm/s MR Peak grad: 72.2 mmHg MR Vmax:      425.00 cm/s   SHUNTS MV E velocity: 106.00 cm/s  Systemic VTI:  0.27 m MV A velocity: 86.30 cm/s   Systemic Diam: 2.10 cm MV E/A ratio:  1.23  Jules Oar MD Electronically signed by Jules Oar MD Signature Date/Time: 08/20/2023/4:02:16 PM    Final          ______________________________________________________________________________________________         Assessment & Plan   Acute HFpEF Complicated by hyponatermia, AKI, and edema (improving with diuresis) - Continue furosemide dose to 80 mg IV BID - HTN: add spironolactone 25 mg PO daily - AKI: improving; when resolves, stop hydralazine  and add irebsartan - BNP ordered for tomorrow (if euvolemic transition to lasix 40 mg PO Daily)    Hx Stroke and TIA - Switched from aspirin  to  clopidogrel for antiplatelet therapy this admission   Aortic atherosclerosis - will resume outpatient pravastatin  80 mg today   Morbid obesity with no MEN2a or Medullary thyroid  carcinoma hx Obstructive sleep apnea - Severe obstructive sleep apnea diagnosed in 2023. CPAP not tolerated. - Consider repeating sleep study to explore alternative treatments such as GLP-1 therapy or Inspire as outpatient   Prolonged Qtc - home Nadolol    Rest as per primary    For questions or updates, please contact [facility] Please consult [email or web address] for contact info under Cardiology/STEMI.      Gloriann Larger, MD FASE [facility] Cardiologist [facility]  [address] [place], [address] [place], [place] [address] [phone]  8:12 AM

## 2023-08-22 DIAGNOSIS — J9601 Acute respiratory failure with hypoxia: Secondary | ICD-10-CM | POA: Diagnosis not present

## 2023-08-22 DIAGNOSIS — J4541 Moderate persistent asthma with (acute) exacerbation: Secondary | ICD-10-CM | POA: Diagnosis not present

## 2023-08-22 DIAGNOSIS — G4730 Sleep apnea, unspecified: Secondary | ICD-10-CM

## 2023-08-22 DIAGNOSIS — E876 Hypokalemia: Secondary | ICD-10-CM

## 2023-08-22 LAB — BASIC METABOLIC PANEL WITH GFR
Anion gap: 12 (ref 5–15)
BUN: 43 mg/dL — ABNORMAL HIGH (ref 8–23)
CO2: 29 mmol/L (ref 22–32)
Calcium: 9.1 mg/dL (ref 8.9–10.3)
Chloride: 96 mmol/L — ABNORMAL LOW (ref 98–111)
Creatinine, Ser: 1.3 mg/dL — ABNORMAL HIGH (ref 0.44–1.00)
GFR, Estimated: 44 mL/min — ABNORMAL LOW (ref >60)
Glucose, Bld: 148 mg/dL — ABNORMAL HIGH (ref 70–99)
Potassium: 3.2 mmol/L — ABNORMAL LOW (ref 3.5–5.1)
Sodium: 137 mmol/L (ref 135–145)

## 2023-08-22 LAB — BRAIN NATRIURETIC PEPTIDE: B Natriuretic Peptide: 171 pg/mL — ABNORMAL HIGH (ref 0.0–100.0)

## 2023-08-22 MED ORDER — POTASSIUM CHLORIDE CRYS ER 20 MEQ PO TBCR
40.0000 meq | EXTENDED_RELEASE_TABLET | Freq: Once | ORAL | Status: AC
  Administered 2023-08-22: 40 meq via ORAL
  Filled 2023-08-22: qty 2

## 2023-08-22 MED ORDER — FUROSEMIDE 40 MG PO TABS: 60.0000 mg | ORAL_TABLET | Freq: Every day | ORAL | Status: DC

## 2023-08-22 MED ORDER — IPRATROPIUM-ALBUTEROL 0.5-2.5 (3) MG/3ML IN SOLN: 3.0000 mL | Freq: Four times a day (QID) | RESPIRATORY_TRACT | Status: DC | PRN

## 2023-08-22 MED ORDER — TRAZODONE HCL 50 MG PO TABS
150.0000 mg | ORAL_TABLET | Freq: Every day | ORAL | Status: DC
  Administered 2023-08-22: 150 mg via ORAL
  Filled 2023-08-22: qty 1

## 2023-08-22 MED ORDER — POTASSIUM CHLORIDE CRYS ER 20 MEQ PO TBCR
20.0000 meq | EXTENDED_RELEASE_TABLET | Freq: Once | ORAL | Status: AC
  Administered 2023-08-22: 20 meq via ORAL
  Filled 2023-08-22: qty 1

## 2023-08-22 MED ORDER — FUROSEMIDE 40 MG PO TABS
60.0000 mg | ORAL_TABLET | Freq: Every day | ORAL | Status: DC
  Administered 2023-08-22 – 2023-08-23 (×2): 60 mg via ORAL
  Filled 2023-08-22 (×2): qty 1

## 2023-08-22 NOTE — Plan of Care (Signed)
   Problem: Education: Goal: Knowledge of General Education information will improve Description Including pain rating scale, medication(s)/side effects and non-pharmacologic comfort measures Outcome: Progressing   Problem: Clinical Measurements: Goal: Will remain free from infection Outcome: Progressing  Goal: Diagnostic test results will improve Outcome: Progressing Goal: Respiratory complications will improve Outcome: Progressing Goal: Cardiovascular complication will be avoided Outcome: Progressing   Problem: Activity: Goal: Risk for activity intolerance will decrease Outcome: Progressing

## 2023-08-22 NOTE — TOC Progression Note (Signed)
 Transition of Care [facility]) - Progression Note   Patient Details  Name: Becky [name] MRN: [id number] Date of Birth: Jun 17, 1950  [facility] CM/SW Contact  Duwaine GORMAN Aran, LCSW Phone Number: 08/22/2023, 1:13 PM  Clinical Narrative: HHPT/OT orders placed by hospitalist. CSW notified Jon with [facility] of orders. Patient weaned to room air. No DME needs expected at this time.  Expected Discharge Plan: Home w Home Health Services Barriers to Discharge: Continued Medical Work up  Expected Discharge Plan and Services In-house Referral: Clinical Social Work Post Acute Care Choice: Home Health Living arrangements for the past 2 months: Apartment HH Agency: Other - See comment Producer, television/film/video) Date HH Agency Contacted: 08/20/23 Time HH Agency Contacted: 1131 Representative spoke with at [facility] Agency: Jon  Social Determinants of Health (SDOH) Interventions SDOH Screenings   Food Insecurity: No Food Insecurity (08/18/2023)  Housing: Low Risk  (08/18/2023)  Transportation Needs: No Transportation Needs (08/18/2023)  Utilities: Not At Risk (08/18/2023)  Social Connections: Patient Declined (08/18/2023)  Tobacco Use: Low Risk  (08/20/2023)   Readmission Risk Interventions    08/20/2023   12:36 PM 08/19/2023   10:28 AM 06/29/2023    1:16 PM  Readmission Risk Prevention Plan  Transportation Screening Complete Complete Complete  PCP or Specialist Appt within 5-7 Days   Complete  Home Care Screening   Complete  Medication Review (RN CM)   Complete  HRI or Home Care Consult Complete Complete   Social Work Consult for Recovery Care Planning/Counseling Complete Complete   Palliative Care Screening Complete Not Applicable   Medication Review Oceanographer) Complete Complete

## 2023-08-22 NOTE — Progress Notes (Signed)
 [facility]  PROGRESS NOTE  Becky [name] FMW:[id number] DOB: April 20, 1950 DOA: 08/18/2023 PCP: Dayna Motto, DO   Brief HPI:   73 y.o. female, with history of hypertension, TIA, hyperlipidemia, CVA, melanoma, CKD stage IIIb, hypothyroidism, depression, NASH, long COVID syndrome, osteopenia.  Patient says that she has been having shortness of breath for past few days and also has bilateral leg swelling, has gained 12 pounds weight over past 1 month.  Her PCP had put her on Lasix  20 mg daily.  Today she went to PCP office, she was given Solu-Medrol , magnesium  and nebulizer with some improvement but remained short of breath.  She was sent to ED for further evaluation.  Patient O2 sats was 88% on room air per EMS.     Assessment/Plan:   Acute hypoxemic respiratory failure-multifactorial in setting of long COVID, diastolic heart failure.  Patient's last echo from 2023 showed EF of 65 to 70% with grade 1 diastolic dysfunction.    Started on high-dose Lasix  80 mg IV twice daily per cardiology.  Diuresed very well.  Will switch to Lasix  60 mg daily continue Aldactone  25 mg daily as per cardiology recommendation.  COPD exacerbation-  Started on  DuoNeb nebulizers every 6 hours, continue Solu-Medrol  40 mg IV every 12 hours.  Pulse ox on ambulation showed 98% on room air.  Acute on chronic diastolic heart failure-patient has gained 12 pounds over past few weeks, has bilateral lower extremity edema.  Started on Lasix  but developed renal insufficiency.  Cardiology consulted for helping with diuresis.  Started on high-dose Lasix  80 IV twice daily with good diuresis.  Will switch to Lasix  60 mg daily as per cardiology recommendation.  Continue Aldactone  25 mg p.o. daily.   Repeat echo from yesterday shows EF of 70 to 75%.  Hypothyroidism-continue Synthroid   Hyperlipidemia-continue pravastatin   Depression-continue Abilify , bupropion   Acute on CKD stage IIIb-creatinine is improving with diuresis.   Today creatinine is 1.30 .  Follow renal function in a.m.  Hypokalemia-potassium is 3.2.  Replace potassium and follow BMP in am.   Medications     ARIPiprazole   5 mg Oral QHS   buPROPion   150 mg Oral q morning   clopidogrel   75 mg Oral Daily   dicyclomine   20 mg Oral BID   enoxaparin  (LOVENOX ) injection  40 mg Subcutaneous QHS   fluticasone  furoate-vilanterol  1 puff Inhalation Daily   furosemide   60 mg Oral Daily   hydrALAZINE   25 mg Oral Q8H   levothyroxine   25 mcg Oral Q0600   methylPREDNISolone  (SOLU-MEDROL ) injection  40 mg Intravenous Q12H   nadolol   20 mg Oral Daily   potassium chloride   20 mEq Oral Once   pramipexole   0.5 mg Oral QHS   pravastatin   80 mg Oral q1800   sodium chloride  flush  10-40 mL Intracatheter Q12H   sodium chloride  flush  3 mL Intravenous Q12H   spironolactone   25 mg Oral Daily     Data Reviewed:   CBG:  No results for input(s): GLUCAP in the last 168 hours.  SpO2: 94 % O2 Flow Rate (L/min): 2 L/min FiO2 (%): 32 %    Vitals:   08/21/23 2058 08/22/23 0518 08/22/23 0856 08/22/23 0859  BP:  (!) 155/76    Pulse:  64    Resp:      Temp:  98 F (36.7 C)    TempSrc:  Axillary    SpO2: 94% 94% 94% 94%  Weight:  96.8 kg  Height:          Data Reviewed:  Basic Metabolic Panel: Recent Labs  Lab 08/18/23 1304 08/19/23 0359 08/20/23 0406 08/21/23 0345 08/22/23 0414  NA 140 138 134* 138 137  K 3.2* 4.6 4.3 3.8 3.2*  CL 103 100 99 99 96*  CO2 26 26 25 28 29   GLUCOSE 102* 158* 149* 154* 148*  BUN 22 28* 34* 41* 43*  CREATININE 1.40* 1.40* 1.69* 1.38* 1.30*  CALCIUM 8.8* 9.4 9.0 9.5 9.1    CBC: Recent Labs  Lab 08/18/23 1304 08/19/23 0359  WBC 11.6* 14.2*  NEUTROABS 8.2*  --   HGB 12.0 13.8  HCT 38.1 44.2  MCV 92.9 94.4  PLT 295 309    LFT Recent Labs  Lab 08/19/23 0359  AST 19  ALT 18  ALKPHOS 85  BILITOT 0.6  PROT 7.5  ALBUMIN 4.1     Antibiotics: Anti-infectives (From admission, onward)    Start      Dose/Rate Route Frequency Ordered Stop   08/18/23 1600  levofloxacin  (LEVAQUIN ) IVPB 500 mg        500 mg 100 mL/hr over 60 Minutes Intravenous  Once 08/18/23 1547 08/18/23 1751        DVT prophylaxis: Lovenox   Code Status: Full code  Family Communication:    CONSULTS cardiology   Subjective   Complains of dry cough/dry mouth.  Diuresed significantly with IV Lasix .   Objective    Physical Examination:  Appears in no acute distress S1-S2, regular Lungs clear to auscultation bilaterally Abdomen soft, nontender Extremities trace edema bilaterally  Status is: Inpatient:             Becky [name]   [facility] If 7PM-7AM, please contact night-coverage at [email or web address], Office  [phone]   08/22/2023, 11:45 AM  LOS: 4 days

## 2023-08-22 NOTE — Progress Notes (Signed)
 Mobility Specialist - Progress Note   08/22/23 1322  Mobility  Activity Ambulated with assistance in hallway  Level of Assistance Standby assist, set-up cues, supervision of patient - no hands on  Assistive Device Cane  Distance Ambulated (ft) 160 ft  Activity Response Tolerated well  Mobility Referral Yes  Mobility visit 1 Mobility  Mobility Specialist Start Time (ACUTE ONLY) 1312  Mobility Specialist Stop Time (ACUTE ONLY) 1322  Mobility Specialist Time Calculation (min) (ACUTE ONLY) 10 min   Pt received EOB and agreeable to mobility. No complaints during session. Pt to EOB after session with all needs met.    [facility]

## 2023-08-22 NOTE — Progress Notes (Signed)
 Progress Note  Patient Name: Becky [name] Date of Encounter: 08/22/2023  Primary Cardiologist: Madonna Large, DO   Subjective   Dyspnea is improved.   Inpatient Medications    Scheduled Meds:  ARIPiprazole   5 mg Oral QHS   buPROPion   150 mg Oral q morning   clopidogrel   75 mg Oral Daily   dicyclomine   20 mg Oral BID   enoxaparin  (LOVENOX ) injection  40 mg Subcutaneous QHS   fluticasone  furoate-vilanterol  1 puff Inhalation Daily   hydrALAZINE   25 mg Oral Q8H   levothyroxine   25 mcg Oral Q0600   methylPREDNISolone  (SOLU-MEDROL ) injection  40 mg Intravenous Q12H   nadolol   20 mg Oral Daily   pramipexole   0.5 mg Oral QHS   pravastatin   80 mg Oral q1800   sodium chloride  flush  10-40 mL Intracatheter Q12H   sodium chloride  flush  3 mL Intravenous Q12H   spironolactone   25 mg Oral Daily   Continuous Infusions:  PRN Meds: acetaminophen  **OR** acetaminophen , ALPRAZolam , ipratropium-albuterol , menthol -cetylpyridinium, sodium chloride  flush, sodium chloride  flush   Vital Signs    Vitals:   08/21/23 2058 08/22/23 0518 08/22/23 0856 08/22/23 0859  BP:  (!) 155/76    Pulse:  64    Resp:      Temp:  98 F (36.7 C)    TempSrc:  Axillary    SpO2: 94% 94% 94% 94%  Weight:  96.8 kg    Height:        Intake/Output Summary (Last 24 hours) at 08/22/2023 1107 Last data filed at 08/22/2023 0900 Gross per 24 hour  Intake 250 ml  Output 1750 ml  Net -1500 ml   Filed Weights   08/20/23 0413 08/21/23 0533 08/22/23 0518  Weight: 96.1 kg 98 kg 96.8 kg    Telemetry    NSR at 74 with PVC's. - Personally Reviewed  ECG    none - Personally Reviewed  Physical Exam   GEN: morbidly obese, no acute distress.   Neck: No JVD Cardiac: RRR, no murmurs, rubs, or gallops.  Respiratory: Clear to auscultation bilaterally. GI: Soft, nontender, non-distended  MS: No edema; No deformity. Neuro:  Nonfocal  Psych: Normal affect   Labs    Chemistry Recent Labs  Lab  08/19/23 0359 08/20/23 0406 08/21/23 0345 08/22/23 0414  NA 138 134* 138 137  K 4.6 4.3 3.8 3.2*  CL 100 99 99 96*  CO2 26 25 28 29   GLUCOSE 158* 149* 154* 148*  BUN 28* 34* 41* 43*  CREATININE 1.40* 1.69* 1.38* 1.30*  CALCIUM 9.4 9.0 9.5 9.1  PROT 7.5  --   --   --   ALBUMIN 4.1  --   --   --   AST 19  --   --   --   ALT 18  --   --   --   ALKPHOS 85  --   --   --   BILITOT 0.6  --   --   --   GFRNONAA 40* 32* 41* 44*  ANIONGAP 12 10 11 12      Hematology Recent Labs  Lab 08/18/23 1304 08/19/23 0359  WBC 11.6* 14.2*  RBC 4.10 4.68  HGB 12.0 13.8  HCT 38.1 44.2  MCV 92.9 94.4  MCH 29.3 29.5  MCHC 31.5 31.2  RDW 13.3 13.2  PLT 295 309    Cardiac EnzymesNo results for input(s): TROPONINI in the last 168 hours. No results for input(s): TROPIPOC in the  last 168 hours.   BNP Recent Labs  Lab 08/18/23 1304 08/22/23 0413  BNP 233.2* 171.0*     DDimer No results for input(s): DDIMER in the last 168 hours.   Radiology    ECHOCARDIOGRAM COMPLETE Result Date: 08/20/2023    ECHOCARDIOGRAM REPORT   Patient Name:   Becky [name] Date of Exam: 08/20/2023 Medical Rec #:  [id number]            Height:       60.0 in Accession #:    [id number]           Weight:       211.9 lb Date of Birth:  [birth year]            BSA:          1.913 m Patient Age:    72 years             BP:           159/81 mmHg Patient Gender: F                    HR:           64 bpm. Exam Location:  Inpatient Procedure: 2D Echo, Cardiac Doppler and Color Doppler (Both Spectral and Color            Flow Doppler were utilized during procedure). Indications:    Dyspnea R06.00  History:        Patient has prior history of Echocardiogram examinations, most                 recent 08/18/2021. Chronic Kidney Disease,                 Signs/Symptoms:Shortness of Breath and Dyspnea; Risk                 Factors:Hypertension and Dyslipidemia.  Sonographer:    Koleen Popper RDCS Referring Phys: [id number] Lameeka Schleifer A  PARCELLS IMPRESSIONS  1. Left ventricular ejection fraction, by estimation, is 70 to 75%. The left ventricle has hyperdynamic function. The left ventricle has no regional wall motion abnormalities. There is mild concentric left ventricular hypertrophy. Left ventricular diastolic parameters are consistent with Grade II diastolic dysfunction (pseudonormalization).  2. Right ventricular systolic function is normal. The right ventricular size is normal. There is mildly elevated pulmonary artery systolic pressure. The estimated right ventricular systolic pressure is 43.0 mmHg.  3. Left atrial size was mildly dilated.  4. The mitral valve is normal in structure. Trivial mitral valve regurgitation. No evidence of mitral stenosis.  5. The aortic valve is tricuspid. There is mild calcification of the aortic valve. Aortic valve regurgitation is not visualized. No aortic stenosis is present.  6. The inferior vena cava is normal in size with <50% respiratory variability, suggesting right atrial pressure of 8 mmHg. FINDINGS  Left Ventricle: Left ventricular ejection fraction, by estimation, is 70 to 75%. The left ventricle has hyperdynamic function. The left ventricle has no regional wall motion abnormalities. The left ventricular internal cavity size was normal in size. There is mild concentric left ventricular hypertrophy. Left ventricular diastolic parameters are consistent with Grade II diastolic dysfunction (pseudonormalization). Right Ventricle: The right ventricular size is normal. No increase in right ventricular wall thickness. Right ventricular systolic function is normal. There is mildly elevated pulmonary artery systolic pressure. The tricuspid regurgitant velocity is 2.96  m/s, and with an assumed right atrial pressure of 8 mmHg, the estimated right ventricular  systolic pressure is 43.0 mmHg. Left Atrium: Left atrial size was mildly dilated. Right Atrium: Right atrial size was normal in size. Pericardium: There is  no evidence of pericardial effusion. Mitral Valve: The mitral valve is normal in structure. Trivial mitral valve regurgitation. No evidence of mitral valve stenosis. Tricuspid Valve: The tricuspid valve is normal in structure. Tricuspid valve regurgitation is trivial. No evidence of tricuspid stenosis. Aortic Valve: The aortic valve is tricuspid. There is mild calcification of the aortic valve. Aortic valve regurgitation is not visualized. No aortic stenosis is present. Pulmonic Valve: The pulmonic valve was normal in structure. Pulmonic valve regurgitation is not visualized. No evidence of pulmonic stenosis. Aorta: The aortic root is normal in size and structure. Venous: The inferior vena cava is normal in size with less than 50% respiratory variability, suggesting right atrial pressure of 8 mmHg. IAS/Shunts: No atrial level shunt detected by color flow Doppler.  LEFT VENTRICLE PLAX 2D LVIDd:         5.90 cm   Diastology LVIDs:         3.80 cm   LV e' medial:    5.43 cm/s LV PW:         1.10 cm   LV E/e' medial:  19.5 LV IVS:        1.10 cm   LV e' lateral:   4.26 cm/s LVOT diam:     2.10 cm   LV E/e' lateral: 24.9 LV SV:         92 LV SV Index:   48 LVOT Area:     3.46 cm  RIGHT VENTRICLE             IVC RV S prime:     15.30 cm/s  IVC diam: 1.90 cm TAPSE (M-mode): 2.1 cm LEFT ATRIUM             Index LA diam:        4.80 cm 2.51 cm/m LA Vol (A2C):   42.6 ml 22.27 ml/m LA Vol (A4C):   52.1 ml 27.24 ml/m LA Biplane Vol: 47.9 ml 25.04 ml/m  AORTIC VALVE LVOT Vmax:   141.00 cm/s LVOT Vmean:  84.500 cm/s LVOT VTI:    0.267 m  AORTA Ao Root diam: 3.30 cm Ao Asc diam:  3.50 cm MITRAL VALVE                TRICUSPID VALVE MV Area (PHT): 4.68 cm     TR Peak grad:   35.0 mmHg MV Decel Time: 162 msec     TR Vmax:        296.00 cm/s MR Peak grad: 72.2 mmHg MR Vmax:      425.00 cm/s   SHUNTS MV E velocity: 106.00 cm/s  Systemic VTI:  0.27 m MV A velocity: 86.30 cm/s   Systemic Diam: 2.10 cm MV E/A ratio:  1.23 Toribio Fuel MD Electronically signed by Toribio Fuel MD Signature Date/Time: 08/20/2023/4:02:16 PM    Final     Cardiac Studies   See above  Patient Profile     73 y.o. female admitted with HFPEF, acute  Assessment & Plan    Acute HFPEF - her weight is down and symtoms improved. As per Dr. REY, transition to oral lasix , 60 mg daily, and add aldactone . Consider DC tomorrow. Morbid obesity - consider GLP as an outpatient. Sleep apnea - weight loss would help. She needs to followup in sleep med as an outpatient.    For  questions or updates, please contact [facility] Please consult [email or web address] for contact info under Cardiology/STEMI.      Signed, Danelle Birmingham, MD  08/22/2023, 11:07 AM

## 2023-08-23 DIAGNOSIS — I5041 Acute combined systolic (congestive) and diastolic (congestive) heart failure: Secondary | ICD-10-CM

## 2023-08-23 DIAGNOSIS — J9601 Acute respiratory failure with hypoxia: Secondary | ICD-10-CM | POA: Diagnosis not present

## 2023-08-23 LAB — CULTURE, BLOOD (ROUTINE X 2)
Culture: NO GROWTH
Culture: NO GROWTH
Special Requests: ADEQUATE

## 2023-08-23 LAB — BASIC METABOLIC PANEL WITH GFR
Anion gap: 11 (ref 5–15)
BUN: 47 mg/dL — ABNORMAL HIGH (ref 8–23)
CO2: 29 mmol/L (ref 22–32)
Calcium: 9 mg/dL (ref 8.9–10.3)
Chloride: 96 mmol/L — ABNORMAL LOW (ref 98–111)
Creatinine, Ser: 1.36 mg/dL — ABNORMAL HIGH (ref 0.44–1.00)
GFR, Estimated: 41 mL/min — ABNORMAL LOW (ref >60)
Glucose, Bld: 149 mg/dL — ABNORMAL HIGH (ref 70–99)
Potassium: 3.5 mmol/L (ref 3.5–5.1)
Sodium: 136 mmol/L (ref 135–145)

## 2023-08-23 MED ORDER — FUROSEMIDE 20 MG PO TABS: 60.0000 mg | ORAL_TABLET | Freq: Every day | ORAL | 3 refills | Status: DC

## 2023-08-23 MED ORDER — PREDNISONE 10 MG PO TABS: ORAL_TABLET | ORAL | 0 refills | Status: DC

## 2023-08-23 MED ORDER — SPIRONOLACTONE 25 MG PO TABS: 25.0000 mg | ORAL_TABLET | Freq: Every day | ORAL | 2 refills | Status: DC

## 2023-08-23 MED ORDER — CLOPIDOGREL BISULFATE 75 MG PO TABS: 75.0000 mg | ORAL_TABLET | Freq: Every day | ORAL | 3 refills | Status: AC

## 2023-08-23 NOTE — TOC Transition Note (Signed)
 Transition of Care [facility]) - Discharge Note   Patient Details  Name: Becky [name] MRN: [id number] Date of Birth: 09/10/1950  Transition of Care [facility]) CM/SW Contact:  Jon ONEIDA Anon, RN Phone Number: 08/23/2023, 12:55 PM   Clinical Narrative:    Pt to discharge home with home health. Pt has HH orders in place for HHPT/OT with [facility]. Pt family member will provide transportation at discharge. No DME needs at this time. There are no other TOC needs at this time.   Final next level of care: Home w Home Health Services Barriers to Discharge: No Barriers Identified   Patient Goals and CMS Choice Patient states their goals for this hospitalization and ongoing recovery are:: To return home with Home health CMS Medicare.gov Compare Post Acute Care list provided to:: Patient Choice offered to / list presented to : Patient [place] ownership interest in [facility]provided to:: Patient    Discharge Placement                       Discharge Plan and Services Additional resources added to the After Visit Summary for   In-house Referral: Clinical Social Work   Post Acute Care Choice: Home Health          DME Arranged: N/A DME Agency: NA       HH Arranged: PT, OT HH Agency: Other - See comment Producer, television/film/video HH) Date HH Agency Contacted: 08/20/23 Time HH Agency Contacted: 1131 Representative spoke with at [facility] Agency: Jon  Social Drivers of Health (SDOH) Interventions SDOH Screenings   Food Insecurity: No Food Insecurity (08/18/2023)  Housing: Low Risk  (08/18/2023)  Transportation Needs: No Transportation Needs (08/18/2023)  Utilities: Not At Risk (08/18/2023)  Social Connections: Patient Declined (08/18/2023)  Tobacco Use: Low Risk  (08/20/2023)     Readmission Risk Interventions    08/20/2023   12:36 PM 08/19/2023   10:28 AM 06/29/2023    1:16 PM  Readmission Risk Prevention Plan  Transportation Screening Complete Complete Complete  PCP or  Specialist Appt within 5-7 Days   Complete  Home Care Screening   Complete  Medication Review (RN CM)   Complete  HRI or Home Care Consult Complete Complete   Social Work Consult for Recovery Care Planning/Counseling Complete Complete   Palliative Care Screening Complete Not Applicable   Medication Review Oceanographer) Complete Complete

## 2023-08-23 NOTE — Progress Notes (Addendum)
 Discharge instructions given to patient. No concerns verbalized. Pt awaiting ride to discharge to home.

## 2023-08-23 NOTE — Progress Notes (Signed)
 Progress Note  Patient Name: Becky [name] Date of Encounter: 08/23/2023  Primary Cardiologist: Madonna Large, DO   Subjective    I'm ready to go home. I feel better.  Inpatient Medications    Scheduled Meds:  ARIPiprazole   5 mg Oral QHS   buPROPion   150 mg Oral q morning   clopidogrel   75 mg Oral Daily   dicyclomine   20 mg Oral BID   enoxaparin  (LOVENOX ) injection  40 mg Subcutaneous QHS   fluticasone  furoate-vilanterol  1 puff Inhalation Daily   furosemide   60 mg Oral Daily   hydrALAZINE   25 mg Oral Q8H   levothyroxine   25 mcg Oral Q0600   methylPREDNISolone  (SOLU-MEDROL ) injection  40 mg Intravenous Q12H   nadolol   20 mg Oral Daily   pramipexole   0.5 mg Oral QHS   pravastatin   80 mg Oral q1800   sodium chloride  flush  10-40 mL Intracatheter Q12H   sodium chloride  flush  3 mL Intravenous Q12H   spironolactone   25 mg Oral Daily   traZODone   150 mg Oral QHS   Continuous Infusions:  PRN Meds: acetaminophen  **OR** acetaminophen , ALPRAZolam , ipratropium-albuterol , menthol -cetylpyridinium, sodium chloride  flush, sodium chloride  flush   Vital Signs    Vitals:   08/22/23 1201 08/22/23 2030 08/23/23 0556 08/23/23 0934  BP: (!) 144/70 (!) 147/91 (!) 155/83   Pulse: (!) 57 68 (!) 46   Resp: 18     Temp: 98.4 F (36.9 C) 98.8 F (37.1 C) 97.7 F (36.5 C)   TempSrc: Oral Oral Oral   SpO2: 95% 97% 93% 92%  Weight:   95.6 kg   Height:        Intake/Output Summary (Last 24 hours) at 08/23/2023 1025 Last data filed at 08/23/2023 0600 Gross per 24 hour  Intake 120 ml  Output 2900 ml  Net -2780 ml   Filed Weights   08/21/23 0533 08/22/23 0518 08/23/23 0556  Weight: 98 kg 96.8 kg 95.6 kg    Telemetry    NSR at 57/min - Personally Reviewed  ECG    none - Personally Reviewed  Physical Exam   GEN: No acute distress.   Neck: no JVD Cardiac: RRR, no murmurs, rubs, or gallops.  Respiratory: Clear to auscultation bilaterally. GI: Soft, nontender,  non-distended  MS: No edema; No deformity. Neuro:  Nonfocal  Psych: Normal affect   Labs    Chemistry Recent Labs  Lab 08/19/23 0359 08/20/23 0406 08/21/23 0345 08/22/23 0414 08/23/23 0339  NA 138   < > 138 137 136  K 4.6   < > 3.8 3.2* 3.5  CL 100   < > 99 96* 96*  CO2 26   < > 28 29 29   GLUCOSE 158*   < > 154* 148* 149*  BUN 28*   < > 41* 43* 47*  CREATININE 1.40*   < > 1.38* 1.30* 1.36*  CALCIUM 9.4   < > 9.5 9.1 9.0  PROT 7.5  --   --   --   --   ALBUMIN 4.1  --   --   --   --   AST 19  --   --   --   --   ALT 18  --   --   --   --   ALKPHOS 85  --   --   --   --   BILITOT 0.6  --   --   --   --   GFRNONAA 40*   < >  41* 44* 41*  ANIONGAP 12   < > 11 12 11    < > = values in this interval not displayed.     Hematology Recent Labs  Lab 08/18/23 1304 08/19/23 0359  WBC 11.6* 14.2*  RBC 4.10 4.68  HGB 12.0 13.8  HCT 38.1 44.2  MCV 92.9 94.4  MCH 29.3 29.5  MCHC 31.5 31.2  RDW 13.3 13.2  PLT 295 309    Cardiac EnzymesNo results for input(s): TROPONINI in the last 168 hours. No results for input(s): TROPIPOC in the last 168 hours.   BNP Recent Labs  Lab 08/18/23 1304 08/22/23 0413  BNP 233.2* 171.0*     DDimer No results for input(s): DDIMER in the last 168 hours.   Radiology    No results found.  Cardiac Studies   none  Patient Profile     73 y.o. female admitted with acute HFPEF  Assessment & Plan    Acute HFPEF - Her weight is down. Renal function is stable. Ok for [employer] home on oral lasix  as noted yesterday.  Obesity - she will need to see her primary MD.   [facility] will sign off.   The patient is ready for discharge today from a cardiac standpoint. Medication Recommendations:  see above Other recommendations (labs, testing, etc):  none Follow up as an outpatient:  Dr. Michele  For questions or updates, please contact [facility] Please consult [email or web address] for contact info under Cardiology/STEMI.       Signed, Danelle Birmingham, MD  08/23/2023, 10:25 AM

## 2023-08-23 NOTE — Progress Notes (Signed)
Pt discharged to home. Left unit in wheelchair pushed by NT. Left in stable condition.  ?

## 2023-08-23 NOTE — Discharge Summary (Signed)
 Physician Discharge Summary   Patient: Becky [name] MRN: [id number] DOB: [birth year]  Admit date:     08/18/2023  Discharge date: 08/23/23  Discharge Physician: Sabas GORMAN Brod   PCP: Dayna Motto, DO   Recommendations at discharge:   Follow-up PCP as outpatient Follow-up cardiology as outpatient  Discharge Diagnoses: Principal Problem:   Acute hypoxemic respiratory failure (HCC)  Resolved Problems:   * No resolved hospital problems. *  Hospital Course: 73 y.o. female, with history of hypertension, TIA, hyperlipidemia, CVA, melanoma, CKD stage IIIb, hypothyroidism, depression, NASH, long COVID syndrome, osteopenia. Patient says that she has been having shortness of breath for past few days and also has bilateral leg swelling, has gained 12 pounds weight over past 1 month. Her PCP had put her on Lasix  20 mg daily. Today she went to PCP office, she was given Solu-Medrol , magnesium  and nebulizer with some improvement but remained short of breath. She was sent to ED for further evaluation. Patient O2 sats was 88% on room air per EMS.   Assessment and Plan:  Acute hypoxemic respiratory failure-resolved, no longer requiring oxygen.  Multifactorial in setting of long COVID, diastolic heart failure.  Patient's last echo from 2023 showed EF of 65 to 70% with grade 1 diastolic dysfunction.    Started on high-dose Lasix  80 mg IV twice daily per cardiology.  Diuresed very well.  Switch to Lasix  60 mg daily continue Aldactone  25 mg daily as per cardiology recommendation.   COPD exacerbation-  Started on  DuoNeb nebulizers every 6 hours, continue Solu-Medrol  40 mg IV every 12 hours.  Pulse ox on ambulation showed 98% on room air.  Will discharge on prednisone  taper for 3 more days   Acute on chronic diastolic heart failure-significantly improved with diuresis.  Patient has gained 12 pounds over past few weeks, has bilateral lower extremity edema.  Started on Lasix  but developed renal  insufficiency.  Cardiology consulted for helping with diuresis.  Started on high-dose Lasix  80 IV twice daily with good diuresis.  Will switch to Lasix  60 mg daily as per cardiology recommendation.  Continue Aldactone  25 mg p.o. daily.   Repeat echo from yesterday shows EF of 70 to 75%.  Will discharge on Lasix  60 mg daily.  No longer requiring oxygen.  Weight is down to 210 pounds.   Hypothyroidism-continue Synthroid    Hyperlipidemia-continue pravastatin    Depression-continue Abilify , bupropion    Acute on CKD stage IIIb-creatinine is improving with diuresis.  Today creatinine is 1.36 .     Hypokalemia-Replete          Consultants: cardiology Procedures performed:  Disposition: Home Diet recommendation:  Discharge Diet Orders (From admission, onward)     Start     Ordered   08/23/23 0000  Diet - low sodium heart healthy        08/23/23 1122           Cardiac diet DISCHARGE MEDICATION: Allergies as of 08/23/2023       Reactions   Penicillins Anaphylaxis   Telmisartan Other (See Comments)   Suspected cause of angioedema    Zofran  [ondansetron ]    Iodinated Contrast Media Hives   Iohexol  Other (See Comments)    Desc: iodine   Latex Rash   Nsaids Other (See Comments)   CKD        Medication List     STOP taking these medications    amLODipine  10 MG tablet Commonly known as: NORVASC    aspirin  EC 325 MG tablet  hydrochlorothiazide  25 MG tablet Commonly known as: HYDRODIURIL        TAKE these medications    acyclovir 200 MG capsule Commonly known as: ZOVIRAX Take 200 mg by mouth as needed.   albuterol  108 (90 Base) MCG/ACT inhaler Commonly known as: VENTOLIN  HFA Inhale 1 puff into the lungs every 4 (four) hours as needed for wheezing or shortness of breath.   ALPRAZolam  0.25 MG tablet Commonly known as: XANAX  Take 0.25 mg by mouth 3 (three) times daily as needed for anxiety or sleep.   ARIPiprazole  5 MG tablet Commonly known as: ABILIFY  Take  5 mg by mouth at bedtime.   buPROPion  150 MG 24 hr tablet Commonly known as: WELLBUTRIN  XL Take 150 mg by mouth every morning.   clopidogrel  75 MG tablet Commonly known as: PLAVIX  Take 1 tablet (75 mg total) by mouth daily. Start taking on: August 24, 2023   desvenlafaxine  25 MG 24 hr tablet Commonly known as: PRISTIQ  Take 1 tablet (25 mg total) by mouth in the morning.   Dexmethylphenidate  HCl 40 MG Cp24 Take 1 capsule by mouth every morning.   dicyclomine  20 MG tablet Commonly known as: BENTYL  Take 1 tablet (20 mg total) by mouth 2 (two) times daily.   fluticasone -salmeterol 500-50 MCG/ACT Aepb Commonly known as: ADVAIR  Inhale 1 puff into the lungs in the morning and at bedtime.   furosemide  20 MG tablet Commonly known as: LASIX  Take 3 tablets (60 mg total) by mouth daily. Start taking on: August 24, 2023 What changed: how much to take   levothyroxine  25 MCG tablet Commonly known as: SYNTHROID  Take 25 mcg by mouth daily before breakfast.   nadolol  20 MG tablet Commonly known as: CORGARD  Take 1 tablet (20 mg total) by mouth daily. Patient needs appt for # 90 supply or future refills. Please call office to schedule appt.   OVER THE COUNTER MEDICATION Take 1 tablet by mouth daily. Equate indigestion relief.   pramipexole  0.25 MG tablet Commonly known as: MIRAPEX  Take 0.5 mg by mouth at bedtime.   pravastatin  80 MG tablet Commonly known as: PRAVACHOL  Take 80 mg by mouth at bedtime.   predniSONE  10 MG tablet Commonly known as: DELTASONE  Prednisone  30 mg po daily x 1 day then Prednisone  20 mg po daily x 1 day then Prednisone  10 mg daily x 1 day then stop...   prochlorperazine  5 MG tablet Commonly known as: COMPAZINE  Take 1 tablet (5 mg total) by mouth every 6 (six) hours as needed for refractory nausea / vomiting. If promethazine  ineffective   promethazine  25 MG tablet Commonly known as: PHENERGAN  Take 1 tablet (25 mg total) by mouth every 6 (six) hours as needed  for nausea or vomiting.   Spiriva  Respimat 1.25 MCG/ACT Aers Generic drug: Tiotropium Bromide  Monohydrate Inhale 1 puff into the lungs daily. What changed: when to take this   spironolactone  25 MG tablet Commonly known as: ALDACTONE  Take 1 tablet (25 mg total) by mouth daily. Start taking on: August 24, 2023   traZODone  50 MG tablet Commonly known as: DESYREL  Take 50-150 mg by mouth at bedtime as needed for sleep.        Follow-up Information     [facility] Follow up.   Why: [facility] will resume PT and OT in the home after discharge.               Discharge Exam: Filed Weights   08/21/23 0533 08/22/23 0518 08/23/23 0556  Weight: 98 kg 96.8  kg 95.6 kg   General-appears in no acute distress Heart-S1-S2, regular, no murmur auscultated Lungs-clear to auscultation bilaterally, no wheezing or crackles auscultated Abdomen-soft, nontender, no organomegaly Extremities-no edema in the lower extremities Neuro-alert, oriented x3, no focal deficit noted  Condition at discharge: good  The results of significant diagnostics from this hospitalization (including imaging, microbiology, ancillary and laboratory) are listed below for reference.   Imaging Studies: ECHOCARDIOGRAM COMPLETE Result Date: 08/20/2023    ECHOCARDIOGRAM REPORT   Patient Name:   Monzerat CATHERINE Silveria Date of Exam: 08/20/2023 Medical Rec #:  [id number]            Height:       60.0 in Accession #:    [id number]           Weight:       211.9 lb Date of Birth:  1951-01-12            BSA:          1.913 m Patient Age:    72 years             BP:           159/81 mmHg Patient Gender: F                    HR:           64 bpm. Exam Location:  Inpatient Procedure: 2D Echo, Cardiac Doppler and Color Doppler (Both Spectral and Color            Flow Doppler were utilized during procedure). Indications:    Dyspnea R06.00  History:        Patient has prior history of Echocardiogram examinations, most                  recent 08/18/2021. Chronic Kidney Disease,                 Signs/Symptoms:Shortness of Breath and Dyspnea; Risk                 Factors:Hypertension and Dyslipidemia.  Sonographer:    Koleen Popper RDCS Referring Phys: [id number] TAYLOR A PARCELLS IMPRESSIONS  1. Left ventricular ejection fraction, by estimation, is 70 to 75%. The left ventricle has hyperdynamic function. The left ventricle has no regional wall motion abnormalities. There is mild concentric left ventricular hypertrophy. Left ventricular diastolic parameters are consistent with Grade II diastolic dysfunction (pseudonormalization).  2. Right ventricular systolic function is normal. The right ventricular size is normal. There is mildly elevated pulmonary artery systolic pressure. The estimated right ventricular systolic pressure is 43.0 mmHg.  3. Left atrial size was mildly dilated.  4. The mitral valve is normal in structure. Trivial mitral valve regurgitation. No evidence of mitral stenosis.  5. The aortic valve is tricuspid. There is mild calcification of the aortic valve. Aortic valve regurgitation is not visualized. No aortic stenosis is present.  6. The inferior vena cava is normal in size with <50% respiratory variability, suggesting right atrial pressure of 8 mmHg. FINDINGS  Left Ventricle: Left ventricular ejection fraction, by estimation, is 70 to 75%. The left ventricle has hyperdynamic function. The left ventricle has no regional wall motion abnormalities. The left ventricular internal cavity size was normal in size. There is mild concentric left ventricular hypertrophy. Left ventricular diastolic parameters are consistent with Grade II diastolic dysfunction (pseudonormalization). Right Ventricle: The right ventricular size is normal. No increase in right ventricular wall thickness. Right ventricular systolic function is  normal. There is mildly elevated pulmonary artery systolic pressure. The tricuspid regurgitant velocity is 2.96  m/s, and  with an assumed right atrial pressure of 8 mmHg, the estimated right ventricular systolic pressure is 43.0 mmHg. Left Atrium: Left atrial size was mildly dilated. Right Atrium: Right atrial size was normal in size. Pericardium: There is no evidence of pericardial effusion. Mitral Valve: The mitral valve is normal in structure. Trivial mitral valve regurgitation. No evidence of mitral valve stenosis. Tricuspid Valve: The tricuspid valve is normal in structure. Tricuspid valve regurgitation is trivial. No evidence of tricuspid stenosis. Aortic Valve: The aortic valve is tricuspid. There is mild calcification of the aortic valve. Aortic valve regurgitation is not visualized. No aortic stenosis is present. Pulmonic Valve: The pulmonic valve was normal in structure. Pulmonic valve regurgitation is not visualized. No evidence of pulmonic stenosis. Aorta: The aortic root is normal in size and structure. Venous: The inferior vena cava is normal in size with less than 50% respiratory variability, suggesting right atrial pressure of 8 mmHg. IAS/Shunts: No atrial level shunt detected by color flow Doppler.  LEFT VENTRICLE PLAX 2D LVIDd:         5.90 cm   Diastology LVIDs:         3.80 cm   LV e' medial:    5.43 cm/s LV PW:         1.10 cm   LV E/e' medial:  19.5 LV IVS:        1.10 cm   LV e' lateral:   4.26 cm/s LVOT diam:     2.10 cm   LV E/e' lateral: 24.9 LV SV:         92 LV SV Index:   48 LVOT Area:     3.46 cm  RIGHT VENTRICLE             IVC RV S prime:     15.30 cm/s  IVC diam: 1.90 cm TAPSE (M-mode): 2.1 cm LEFT ATRIUM             Index LA diam:        4.80 cm 2.51 cm/m LA Vol (A2C):   42.6 ml 22.27 ml/m LA Vol (A4C):   52.1 ml 27.24 ml/m LA Biplane Vol: 47.9 ml 25.04 ml/m  AORTIC VALVE LVOT Vmax:   141.00 cm/s LVOT Vmean:  84.500 cm/s LVOT VTI:    0.267 m  AORTA Ao Root diam: 3.30 cm Ao Asc diam:  3.50 cm MITRAL VALVE                TRICUSPID VALVE MV Area (PHT): 4.68 cm     TR Peak grad:   35.0 mmHg MV Decel  Time: 162 msec     TR Vmax:        296.00 cm/s MR Peak grad: 72.2 mmHg MR Vmax:      425.00 cm/s   SHUNTS MV E velocity: 106.00 cm/s  Systemic VTI:  0.27 m MV A velocity: 86.30 cm/s   Systemic Diam: 2.10 cm MV E/A ratio:  1.23 Toribio Fuel MD Electronically signed by Toribio Fuel MD Signature Date/Time: 08/20/2023/4:02:16 PM    Final    DG Chest Port 1 View Result Date: 08/18/2023 CLINICAL DATA:  Short of breath, hypoxia EXAM: PORTABLE CHEST 1 VIEW COMPARISON:  07/01/2023 FINDINGS: Single frontal view of the chest demonstrates an enlarged cardiac silhouette. No acute airspace disease, effusion, or pneumothorax. No acute bony abnormalities. IMPRESSION: 1. Enlarged cardiac silhouette. 2. No  acute airspace disease. Electronically Signed   By: Ozell Daring M.D.   On: 08/18/2023 14:37    Microbiology: Results for orders placed or performed during the hospital encounter of 08/18/23  Resp panel by RT-PCR (RSV, Flu A&B, Covid) Anterior Nasal Swab     Status: None   Collection Time: 08/18/23  1:09 PM   Specimen: Anterior Nasal Swab  Result Value Ref Range Status   SARS Coronavirus 2 by RT PCR NEGATIVE NEGATIVE Final    Comment: (NOTE) SARS-CoV-2 target nucleic acids are NOT DETECTED.  The SARS-CoV-2 RNA is generally detectable in upper respiratory specimens during the acute phase of infection. The lowest concentration of SARS-CoV-2 viral copies this assay can detect is 138 copies/mL. A negative result does not preclude SARS-Cov-2 infection and should not be used as the sole basis for treatment or other patient management decisions. A negative result may occur with  improper specimen collection/handling, submission of specimen other than nasopharyngeal swab, presence of viral mutation(s) within the areas targeted by this assay, and inadequate number of viral copies(<138 copies/mL). A negative result must be combined with clinical observations, patient history, and  epidemiological information. The expected result is Negative.  Fact Sheet for Patients:  BloggerCourse.com  Fact Sheet for Healthcare Providers:  SeriousBroker.it  This test is no t yet approved or cleared by the [place]  FDA and  has been authorized for detection and/or diagnosis of SARS-CoV-2 by FDA under an Emergency Use Authorization (EUA). This EUA will remain  in effect (meaning this test can be used) for the duration of the COVID-19 declaration under Section 564(b)(1) of the Act, 21 U.S.C.section 360bbb-3(b)(1), unless the authorization is terminated  or revoked sooner.       Influenza A by PCR NEGATIVE NEGATIVE Final   Influenza B by PCR NEGATIVE NEGATIVE Final    Comment: (NOTE) The Xpert Xpress SARS-CoV-2/FLU/RSV plus assay is intended as an aid in the diagnosis of influenza from Nasopharyngeal swab specimens and should not be used as a sole basis for treatment. Nasal washings and aspirates are unacceptable for Xpert Xpress SARS-CoV-2/FLU/RSV testing.  Fact Sheet for Patients: BloggerCourse.com  Fact Sheet for Healthcare Providers: SeriousBroker.it  This test is not yet approved or cleared by the [place]  FDA and has been authorized for detection and/or diagnosis of SARS-CoV-2 by FDA under an Emergency Use Authorization (EUA). This EUA will remain in effect (meaning this test can be used) for the duration of the COVID-19 declaration under Section 564(b)(1) of the Act, 21 U.S.C. section 360bbb-3(b)(1), unless the authorization is terminated or revoked.     Resp Syncytial Virus by PCR NEGATIVE NEGATIVE Final    Comment: (NOTE) Fact Sheet for Patients: BloggerCourse.com  Fact Sheet for Healthcare Providers: SeriousBroker.it  This test is not yet approved or cleared by the [place]  FDA and has been  authorized for detection and/or diagnosis of SARS-CoV-2 by FDA under an Emergency Use Authorization (EUA). This EUA will remain in effect (meaning this test can be used) for the duration of the COVID-19 declaration under Section 564(b)(1) of the Act, 21 U.S.C. section 360bbb-3(b)(1), unless the authorization is terminated or revoked.  Performed at [facility], [address], [place], [place] [address]   Culture, blood (routine x 2)     Status: None   Collection Time: 08/18/23  4:17 PM   Specimen: BLOOD LEFT HAND  Result Value Ref Range Status   Specimen Description   Final    BLOOD LEFT HAND Performed  at [facility], [address], [place], [place] [address]    Special Requests   Final    BOTTLES DRAWN AEROBIC AND ANAEROBIC Blood Culture results may not be optimal due to an inadequate volume of blood received in culture bottles Performed at [facility], [address], [place], [place] [address]    Culture   Final    NO GROWTH 5 DAYS Performed at [facility], [address], [place], [place] [address]    Report Status 08/23/2023 FINAL  Final  Culture, blood (routine x 2)     Status: None   Collection Time: 08/18/23  7:09 PM   Specimen: BLOOD  Result Value Ref Range Status   Specimen Description   Final    BLOOD BLOOD LEFT ARM Performed at [name], [address], [place], [place] [address]    Special Requests   Final    BOTTLES DRAWN AEROBIC AND ANAEROBIC Blood Culture adequate volume Performed at [facility], [address] [place], [place] [address]    Culture   Final    NO GROWTH 5 DAYS Performed at [facility], [address], [place], [place] [address]    Report Status 08/23/2023 FINAL  Final    Labs: CBC: Recent Labs  Lab 08/18/23 1304 08/19/23 0359  WBC 11.6* 14.2*  NEUTROABS 8.2*  --   HGB 12.0 13.8  HCT 38.1 44.2  MCV 92.9 94.4  PLT  295 309   Basic Metabolic Panel: Recent Labs  Lab 08/19/23 0359 08/20/23 0406 08/21/23 0345 08/22/23 0414 08/23/23 0339  NA 138 134* 138 137 136  K 4.6 4.3 3.8 3.2* 3.5  CL 100 99 99 96* 96*  CO2 26 25 28 29 29   GLUCOSE 158* 149* 154* 148* 149*  BUN 28* 34* 41* 43* 47*  CREATININE 1.40* 1.69* 1.38* 1.30* 1.36*  CALCIUM 9.4 9.0 9.5 9.1 9.0   Liver Function Tests: Recent Labs  Lab 08/19/23 0359  AST 19  ALT 18  ALKPHOS 85  BILITOT 0.6  PROT 7.5  ALBUMIN 4.1   CBG: No results for input(s): GLUCAP in the last 168 hours.  Discharge time spent: greater than 30 minutes.  Signed: Sabas GORMAN Brod, MD [facility] 08/23/2023

## 2023-08-28 DIAGNOSIS — Z09 Encounter for follow-up examination after completed treatment for conditions other than malignant neoplasm: Secondary | ICD-10-CM | POA: Diagnosis not present

## 2023-08-28 DIAGNOSIS — I5032 Chronic diastolic (congestive) heart failure: Secondary | ICD-10-CM | POA: Diagnosis not present

## 2023-08-28 DIAGNOSIS — E876 Hypokalemia: Secondary | ICD-10-CM | POA: Diagnosis not present

## 2023-08-31 ENCOUNTER — Other Ambulatory Visit (HOSPITAL_BASED_OUTPATIENT_CLINIC_OR_DEPARTMENT_OTHER): Payer: Self-pay | Admitting: Pulmonary Disease

## 2023-08-31 DIAGNOSIS — N1831 Chronic kidney disease, stage 3a: Secondary | ICD-10-CM | POA: Diagnosis not present

## 2023-08-31 DIAGNOSIS — E039 Hypothyroidism, unspecified: Secondary | ICD-10-CM | POA: Diagnosis not present

## 2023-08-31 DIAGNOSIS — N183 Chronic kidney disease, stage 3 unspecified: Secondary | ICD-10-CM | POA: Diagnosis not present

## 2023-08-31 NOTE — Telephone Encounter (Signed)
 Please advise on controlled substance for ellison patient

## 2023-09-01 DIAGNOSIS — E7800 Pure hypercholesterolemia, unspecified: Secondary | ICD-10-CM | POA: Diagnosis not present

## 2023-09-01 DIAGNOSIS — I5032 Chronic diastolic (congestive) heart failure: Secondary | ICD-10-CM | POA: Diagnosis not present

## 2023-09-01 DIAGNOSIS — Z713 Dietary counseling and surveillance: Secondary | ICD-10-CM | POA: Diagnosis not present

## 2023-09-01 DIAGNOSIS — N183 Chronic kidney disease, stage 3 unspecified: Secondary | ICD-10-CM | POA: Diagnosis not present

## 2023-09-01 DIAGNOSIS — I10 Essential (primary) hypertension: Secondary | ICD-10-CM | POA: Diagnosis not present

## 2023-09-01 DIAGNOSIS — E039 Hypothyroidism, unspecified: Secondary | ICD-10-CM | POA: Diagnosis not present

## 2023-09-03 DIAGNOSIS — N1832 Chronic kidney disease, stage 3b: Secondary | ICD-10-CM | POA: Diagnosis not present

## 2023-09-03 DIAGNOSIS — I5032 Chronic diastolic (congestive) heart failure: Secondary | ICD-10-CM | POA: Diagnosis not present

## 2023-09-03 DIAGNOSIS — R0902 Hypoxemia: Secondary | ICD-10-CM | POA: Diagnosis not present

## 2023-09-03 DIAGNOSIS — J449 Chronic obstructive pulmonary disease, unspecified: Secondary | ICD-10-CM | POA: Diagnosis not present

## 2023-09-03 DIAGNOSIS — E039 Hypothyroidism, unspecified: Secondary | ICD-10-CM | POA: Diagnosis not present

## 2023-09-03 DIAGNOSIS — I5033 Acute on chronic diastolic (congestive) heart failure: Secondary | ICD-10-CM | POA: Diagnosis not present

## 2023-09-03 DIAGNOSIS — J441 Chronic obstructive pulmonary disease with (acute) exacerbation: Secondary | ICD-10-CM | POA: Diagnosis not present

## 2023-09-03 DIAGNOSIS — E876 Hypokalemia: Secondary | ICD-10-CM | POA: Diagnosis not present

## 2023-09-03 DIAGNOSIS — I130 Hypertensive heart and chronic kidney disease with heart failure and stage 1 through stage 4 chronic kidney disease, or unspecified chronic kidney disease: Secondary | ICD-10-CM | POA: Diagnosis not present

## 2023-09-03 DIAGNOSIS — U099 Post covid-19 condition, unspecified: Secondary | ICD-10-CM | POA: Diagnosis not present

## 2023-09-03 DIAGNOSIS — K7581 Nonalcoholic steatohepatitis (NASH): Secondary | ICD-10-CM | POA: Diagnosis not present

## 2023-09-03 DIAGNOSIS — Z9181 History of falling: Secondary | ICD-10-CM | POA: Diagnosis not present

## 2023-09-03 NOTE — Telephone Encounter (Signed)
 Defer to her PCP for further refills

## 2023-09-03 NOTE — Telephone Encounter (Signed)
 Pharmacy notified.

## 2023-09-04 DIAGNOSIS — I5033 Acute on chronic diastolic (congestive) heart failure: Secondary | ICD-10-CM | POA: Diagnosis not present

## 2023-09-04 DIAGNOSIS — N1832 Chronic kidney disease, stage 3b: Secondary | ICD-10-CM | POA: Diagnosis not present

## 2023-09-04 DIAGNOSIS — U099 Post covid-19 condition, unspecified: Secondary | ICD-10-CM | POA: Diagnosis not present

## 2023-09-04 DIAGNOSIS — I130 Hypertensive heart and chronic kidney disease with heart failure and stage 1 through stage 4 chronic kidney disease, or unspecified chronic kidney disease: Secondary | ICD-10-CM | POA: Diagnosis not present

## 2023-09-04 DIAGNOSIS — J441 Chronic obstructive pulmonary disease with (acute) exacerbation: Secondary | ICD-10-CM | POA: Diagnosis not present

## 2023-09-04 DIAGNOSIS — K7581 Nonalcoholic steatohepatitis (NASH): Secondary | ICD-10-CM | POA: Diagnosis not present

## 2023-09-07 ENCOUNTER — Ambulatory Visit: Admitting: Cardiology

## 2023-09-07 ENCOUNTER — Emergency Department (HOSPITAL_COMMUNITY)
Admission: EM | Admit: 2023-09-07 | Discharge: 2023-09-07 | Discharge status: Against Medical Advice | Attending: Emergency Medicine | Admitting: Emergency Medicine

## 2023-09-07 ENCOUNTER — Encounter (HOSPITAL_COMMUNITY): Payer: Self-pay | Admitting: *Deleted

## 2023-09-07 ENCOUNTER — Other Ambulatory Visit: Payer: Self-pay

## 2023-09-07 DIAGNOSIS — R531 Weakness: Secondary | ICD-10-CM | POA: Diagnosis not present

## 2023-09-07 DIAGNOSIS — R630 Anorexia: Secondary | ICD-10-CM | POA: Diagnosis present

## 2023-09-07 DIAGNOSIS — Z5321 Procedure and treatment not carried out due to patient leaving prior to being seen by health care provider: Secondary | ICD-10-CM | POA: Diagnosis not present

## 2023-09-07 DIAGNOSIS — R000 Tachycardia, unspecified: Secondary | ICD-10-CM | POA: Diagnosis not present

## 2023-09-07 NOTE — ED Triage Notes (Signed)
 BIB EMS released from hospital last Tuesday, CHF pt, 94% RA at present. Decreased appetite. When she walked outside became weaker. 141/84-103-24

## 2023-09-07 NOTE — ED Notes (Signed)
 Pt states that she was brought here by mistake and that she is leaving.

## 2023-09-11 DIAGNOSIS — I5032 Chronic diastolic (congestive) heart failure: Secondary | ICD-10-CM | POA: Diagnosis not present

## 2023-09-15 ENCOUNTER — Ambulatory Visit: Admitting: Cardiology

## 2023-09-16 ENCOUNTER — Encounter: Payer: Self-pay | Admitting: Cardiology

## 2023-09-23 DIAGNOSIS — K7581 Nonalcoholic steatohepatitis (NASH): Secondary | ICD-10-CM | POA: Diagnosis not present

## 2023-09-23 DIAGNOSIS — I5033 Acute on chronic diastolic (congestive) heart failure: Secondary | ICD-10-CM | POA: Diagnosis not present

## 2023-09-23 DIAGNOSIS — N1832 Chronic kidney disease, stage 3b: Secondary | ICD-10-CM | POA: Diagnosis not present

## 2023-09-23 DIAGNOSIS — I130 Hypertensive heart and chronic kidney disease with heart failure and stage 1 through stage 4 chronic kidney disease, or unspecified chronic kidney disease: Secondary | ICD-10-CM | POA: Diagnosis not present

## 2023-09-23 DIAGNOSIS — U099 Post covid-19 condition, unspecified: Secondary | ICD-10-CM | POA: Diagnosis not present

## 2023-09-23 DIAGNOSIS — J441 Chronic obstructive pulmonary disease with (acute) exacerbation: Secondary | ICD-10-CM | POA: Diagnosis not present

## 2023-09-24 ENCOUNTER — Ambulatory Visit: Admitting: Cardiology

## 2023-09-25 DIAGNOSIS — U099 Post covid-19 condition, unspecified: Secondary | ICD-10-CM | POA: Diagnosis not present

## 2023-09-25 DIAGNOSIS — N1832 Chronic kidney disease, stage 3b: Secondary | ICD-10-CM | POA: Diagnosis not present

## 2023-09-25 DIAGNOSIS — I5033 Acute on chronic diastolic (congestive) heart failure: Secondary | ICD-10-CM | POA: Diagnosis not present

## 2023-09-25 DIAGNOSIS — K7581 Nonalcoholic steatohepatitis (NASH): Secondary | ICD-10-CM | POA: Diagnosis not present

## 2023-09-25 DIAGNOSIS — J441 Chronic obstructive pulmonary disease with (acute) exacerbation: Secondary | ICD-10-CM | POA: Diagnosis not present

## 2023-09-25 DIAGNOSIS — I130 Hypertensive heart and chronic kidney disease with heart failure and stage 1 through stage 4 chronic kidney disease, or unspecified chronic kidney disease: Secondary | ICD-10-CM | POA: Diagnosis not present

## 2023-09-28 DIAGNOSIS — J441 Chronic obstructive pulmonary disease with (acute) exacerbation: Secondary | ICD-10-CM | POA: Diagnosis not present

## 2023-09-28 DIAGNOSIS — N1832 Chronic kidney disease, stage 3b: Secondary | ICD-10-CM | POA: Diagnosis not present

## 2023-09-28 DIAGNOSIS — I5033 Acute on chronic diastolic (congestive) heart failure: Secondary | ICD-10-CM | POA: Diagnosis not present

## 2023-09-28 DIAGNOSIS — I130 Hypertensive heart and chronic kidney disease with heart failure and stage 1 through stage 4 chronic kidney disease, or unspecified chronic kidney disease: Secondary | ICD-10-CM | POA: Diagnosis not present

## 2023-09-28 DIAGNOSIS — K7581 Nonalcoholic steatohepatitis (NASH): Secondary | ICD-10-CM | POA: Diagnosis not present

## 2023-09-28 DIAGNOSIS — U099 Post covid-19 condition, unspecified: Secondary | ICD-10-CM | POA: Diagnosis not present

## 2023-09-29 ENCOUNTER — Ambulatory Visit: Attending: Cardiology | Admitting: Cardiology

## 2023-09-29 ENCOUNTER — Encounter: Payer: Self-pay | Admitting: Cardiology

## 2023-09-29 ENCOUNTER — Ambulatory Visit: Admitting: Cardiology

## 2023-09-29 VITALS — BP 125/83 | HR 78 | Resp 16 | Ht 60.0 in | Wt 205.0 lb

## 2023-09-29 DIAGNOSIS — E7800 Pure hypercholesterolemia, unspecified: Secondary | ICD-10-CM | POA: Diagnosis not present

## 2023-09-29 DIAGNOSIS — I5032 Chronic diastolic (congestive) heart failure: Secondary | ICD-10-CM

## 2023-09-29 DIAGNOSIS — R9431 Abnormal electrocardiogram [ECG] [EKG]: Secondary | ICD-10-CM | POA: Diagnosis not present

## 2023-09-29 DIAGNOSIS — I10 Essential (primary) hypertension: Secondary | ICD-10-CM | POA: Diagnosis not present

## 2023-09-29 DIAGNOSIS — R0602 Shortness of breath: Secondary | ICD-10-CM

## 2023-09-29 NOTE — Progress Notes (Unsigned)
 Cardiology Office Note:  .   Date:  09/29/2023  ID:  Becky [name], DOB 06/14/1950, MRN [id number] PCP:  Dayna Motto, DO  Former Cardiology Providers: Dr. Inocencio. [facility] Providers Cardiologist:  Madonna Large, DO, [facility] (established care 05/13/23) Electrophysiologist:  Will Gladis Inocencio, MD  Electrophysiologist:  Soyla Gladis Inocencio, MD  Click to update primary MD,subspecialty MD or APP then REFRESH:1}    No chief complaint on file.   History of Present Illness: .   Becky [name] is a 73 y.o. Caucasian  female whose past medical history and cardiovascular risk factors includes: Hypertension, hyperlipidemia, CKD, stroke, TIA, hypothyroidism, history of prolonged QT.  Patient was referred to general cardiology in March 2025 due to dizziness and shortness of breath.  Given her shortness of breath patient was recommended to have an echocardiogram as well as a Lexi stress test.  Both of these are pending.  She also had labs including NT proBNP and D-dimer which were within acceptable limits.  With regards to her dizziness likely multifactorial/orthostatic.  Patient was recommended to take hydrochlorothiazide  in the morning and to hold amlodipine .  She continues to follow-up with EP given her history of long QT interval and on nadolol .  She presents today for follow-up.  Since last office visit patient was hospitalized in June 2025 for acute hypoxic respiratory failure which were multifactorial along with COPD exacerbation and acute on chronic diastolic heart failure.  Patient was diuresed with parenteral medications as she had gained approximately 12 pounds over several weeks prior to arrival to ED.  Patient is in diuretics and GDMT were titrated prior to discharge.  Discharge summary dated 08/23/2023 reviewed.  *** In wheelchair  LHD and dizzy w/ change in position  Hold off stress test now as sher symptoms have imprved.    Review of Systems: .   Review of  Systems  Cardiovascular:  Positive for dyspnea on exertion (chronic and stable). Negative for chest pain, claudication, irregular heartbeat, leg swelling, near-syncope, orthopnea, palpitations, paroxysmal nocturnal dyspnea and syncope.  Hematologic/Lymphatic: Negative for bleeding problem.    Studies Reviewed:    Echocardiogram: 08/18/2021 1. Left ventricular ejection fraction, by estimation, is 65 to 70%. The  left ventricle has normal function. The left ventricle has no regional  wall motion abnormalities. There is mild concentric left ventricular  hypertrophy. Left ventricular diastolic  parameters are consistent with Grade I diastolic dysfunction (impaired  relaxation). Elevated left ventricular end-diastolic pressure.   2. Right ventricular systolic function is normal. The right ventricular  size is normal. There is normal pulmonary artery systolic pressure.   3. The mitral valve is normal in structure. Trivial mitral valve  regurgitation. No evidence of mitral stenosis.   4. The aortic valve is normal in structure. Aortic valve regurgitation is  not visualized. No aortic stenosis is present.   5. The inferior vena cava is normal in size with greater than 50%  respiratory variability, suggesting right atrial pressure of 3 mmHg.   08/20/2023 LVEF 70 to 75%, hyperdynamic. No regional wall motion normalities. Mild LVH. Grade 2 diastolic dysfunction. Right ventricular size and function are normal, elevated PASP at 43 mmHg. Mild left atrial enlargement. Estimated RAP 8 mmHg  RADIOLOGY: NA  Risk Assessment/Calculations:   NA   Labs:       Latest Ref Rng & Units 08/19/2023    3:59 AM 08/18/2023    1:04 PM 06/29/2023    6:36 AM  CBC  WBC 4.0 - 10.5  K/uL 14.2  11.6  11.5   Hemoglobin 12.0 - 15.0 g/dL 86.1  87.9  86.4   Hematocrit 36.0 - 46.0 % 44.2  38.1  40.2   Platelets 150 - 400 K/uL 309  295  323        Latest Ref Rng & Units 08/23/2023    3:39 AM 08/22/2023     4:14 AM 08/21/2023    3:45 AM  BMP  Glucose 70 - 99 mg/dL 850  851  845   BUN 8 - 23 mg/dL 47  43  41   Creatinine 0.44 - 1.00 mg/dL 8.63  8.69  8.61   Sodium 135 - 145 mmol/L 136  137  138   Potassium 3.5 - 5.1 mmol/L 3.5  3.2  3.8   Chloride 98 - 111 mmol/L 96  96  99   CO2 22 - 32 mmol/L 29  29  28    Calcium 8.9 - 10.3 mg/dL 9.0  9.1  9.5       Latest Ref Rng & Units 08/23/2023    3:39 AM 08/22/2023    4:14 AM 08/21/2023    3:45 AM  CMP  Glucose 70 - 99 mg/dL 850  851  845   BUN 8 - 23 mg/dL 47  43  41   Creatinine 0.44 - 1.00 mg/dL 8.63  8.69  8.61   Sodium 135 - 145 mmol/L 136  137  138   Potassium 3.5 - 5.1 mmol/L 3.5  3.2  3.8   Chloride 98 - 111 mmol/L 96  96  99   CO2 22 - 32 mmol/L 29  29  28    Calcium 8.9 - 10.3 mg/dL 9.0  9.1  9.5    No components found for: NTPROBNP Recent Labs    05/13/23 1602  PROBNP 545*   Recent Labs    02/01/23 0810 06/28/23 1710  TSH 1.052 0.515    External Labs: Collected: 04/28/2023 [facility] database. Total cholesterol 187, HDL 66, LDL 101, triglycerides 111.  Collected: March 25, 2023. Hemoglobin 12.9 g/dL. Creatinine 1.04.  Physical Exam:    Today's Vitals   09/29/23 1630  BP: 125/83  Pulse: 78  Resp: 16  SpO2: 93%  Height: 5' (1.524 m)   Body mass index is 41.16 kg/m. Wt Readings from Last 3 Encounters:  09/07/23 210 lb 12.2 oz (95.6 kg)  08/23/23 210 lb 12.2 oz (95.6 kg)  07/01/23 207 lb 10.8 oz (94.2 kg)    Physical Exam  Constitutional: No distress. She appears chronically ill.  hemodynamically stable, ambulate w/ cane  Neck: No JVD present.  Cardiovascular: Normal rate, regular rhythm, S1 normal and S2 normal. Exam reveals no gallop, no S3 and no S4.  No murmur heard. Pulmonary/Chest: Effort normal and breath sounds normal. No stridor. She has no wheezes. She has no rales.  Musculoskeletal:        General: No edema.     Cervical back: Neck supple.  Neurological:  Alert and oriented x 4, strength is  intact equally in upper and lower extremities, cranial nerves II to XII are also grossly intact.  Skin: Skin is warm.     Impression & Recommendation(s):  Impression: No diagnosis found.    Recommendation(s):  ***   Orders Placed:  No orders of the defined types were placed in this encounter.   Final Medication List:   No orders of the defined types were placed in this encounter.   Medications Discontinued During This Encounter  Medication Reason  . predniSONE  (DELTASONE ) 10 MG tablet Patient Preference     Current Outpatient Medications:  .  acyclovir (ZOVIRAX) 200 MG capsule, Take 200 mg by mouth as needed., Disp: , Rfl:  .  albuterol  (VENTOLIN  HFA) 108 (90 Base) MCG/ACT inhaler, Inhale 1 puff into the lungs every 4 (four) hours as needed for wheezing or shortness of breath., Disp: , Rfl:  .  ALPRAZolam  (XANAX ) 0.25 MG tablet, Take 0.25 mg by mouth 3 (three) times daily as needed for anxiety or sleep., Disp: , Rfl: 1 .  ARIPiprazole  (ABILIFY ) 5 MG tablet, Take 5 mg by mouth at bedtime., Disp: , Rfl:  .  buPROPion  (WELLBUTRIN  XL) 150 MG 24 hr tablet, Take 150 mg by mouth every morning., Disp: , Rfl:  .  clopidogrel  (PLAVIX ) 75 MG tablet, Take 1 tablet (75 mg total) by mouth daily., Disp: 30 tablet, Rfl: 3 .  desvenlafaxine  (PRISTIQ ) 25 MG 24 hr tablet, Take 1 tablet (25 mg total) by mouth in the morning., Disp: 30 tablet, Rfl: 0 .  Dexmethylphenidate  HCl 40 MG CP24, Take 1 capsule by mouth every morning., Disp: , Rfl:  .  dicyclomine  (BENTYL ) 20 MG tablet, Take 1 tablet (20 mg total) by mouth 2 (two) times daily., Disp: 20 tablet, Rfl: 0 .  fluticasone -salmeterol (ADVAIR ) 500-50 MCG/ACT AEPB, Inhale 1 puff into the lungs in the morning and at bedtime., Disp: , Rfl:  .  furosemide  (LASIX ) 20 MG tablet, Take 3 tablets (60 mg total) by mouth daily., Disp: 30 tablet, Rfl: 3 .  levothyroxine  (SYNTHROID ) 25 MCG tablet, Take 25 mcg by mouth daily before breakfast., Disp: , Rfl:  .   nadolol  (CORGARD ) 20 MG tablet, Take 1 tablet (20 mg total) by mouth daily. Patient needs appt for # 90 supply or future refills. Please call office to schedule appt., Disp: 30 tablet, Rfl: 0 .  OVER THE COUNTER MEDICATION, Take 1 tablet by mouth daily. Equate indigestion relief., Disp: , Rfl:  .  pramipexole  (MIRAPEX ) 0.25 MG tablet, Take 0.5 mg by mouth at bedtime., Disp: , Rfl: 11 .  pravastatin  (PRAVACHOL ) 80 MG tablet, Take 80 mg by mouth at bedtime., Disp: , Rfl:  .  prochlorperazine  (COMPAZINE ) 5 MG tablet, Take 1 tablet (5 mg total) by mouth every 6 (six) hours as needed for refractory nausea / vomiting. If promethazine  ineffective, Disp: 30 tablet, Rfl: 0 .  promethazine  (PHENERGAN ) 25 MG tablet, Take 1 tablet (25 mg total) by mouth every 6 (six) hours as needed for nausea or vomiting., Disp: 12 tablet, Rfl: 0 .  spironolactone  (ALDACTONE ) 25 MG tablet, Take 1 tablet (25 mg total) by mouth daily., Disp: 30 tablet, Rfl: 2 .  Tiotropium Bromide  Monohydrate (SPIRIVA  RESPIMAT) 1.25 MCG/ACT AERS, Inhale 1 puff into the lungs daily. (Patient taking differently: Inhale 1 puff into the lungs in the morning.), Disp: 4 g, Rfl: 11 .  traZODone  (DESYREL ) 50 MG tablet, Take 50-150 mg by mouth at bedtime as needed for sleep., Disp: , Rfl:   Consent:   Informed Consent   Shared Decision Making/Informed Consent The risks [chest pain, shortness of breath, cardiac arrhythmias, dizziness, blood pressure fluctuations, myocardial infarction, stroke/transient ischemic attack, nausea, vomiting, allergic reaction, radiation exposure, metallic taste sensation and life-threatening complications (estimated to be 1 in 10,000)], benefits (risk stratification, diagnosing coronary artery disease, treatment guidance) and alternatives of a nuclear stress test were discussed in detail with Ms. Kloos and she agrees to proceed.     Disposition:  6 months sooner if needed.  Her questions and concerns were addressed to her  satisfaction. She voices understanding of the recommendations provided during this encounter.    Signed, Madonna Large, DO, [facility]  [facility]  [address] [place], [place] [address] 09/29/2023 5:23 PM

## 2023-09-29 NOTE — Patient Instructions (Signed)
 Medication Instructions:  No medication changes were made at this visit. Continue current regimen.   *If you need a refill on your cardiac medications before your next appointment, please call your pharmacy*  Lab Work: None ordered today. If you have labs (blood work) drawn today and your tests are completely normal, you will receive your results only by: MyChart Message (if you have MyChart) OR A paper copy in the mail If you have any lab test that is abnormal or we need to change your treatment, we will call you to review the results.  Testing/Procedures: None ordered today.  Follow-Up: At [facility], you and your health needs are our priority.  As part of our continuing mission to provide you with exceptional heart care, our providers are all part of one team.  This team includes your primary Cardiologist (physician) and Advanced Practice Providers or APPs (Physician Assistants and Nurse Practitioners) who all work together to provide you with the care you need, when you need it.  Your next appointment:   1 year(s)  Provider:   Olinda Bertrand, DO

## 2023-09-30 ENCOUNTER — Encounter: Payer: Self-pay | Admitting: Cardiology

## 2023-10-01 DIAGNOSIS — K7581 Nonalcoholic steatohepatitis (NASH): Secondary | ICD-10-CM | POA: Diagnosis not present

## 2023-10-01 DIAGNOSIS — E039 Hypothyroidism, unspecified: Secondary | ICD-10-CM | POA: Diagnosis not present

## 2023-10-01 DIAGNOSIS — N1832 Chronic kidney disease, stage 3b: Secondary | ICD-10-CM | POA: Diagnosis not present

## 2023-10-01 DIAGNOSIS — J441 Chronic obstructive pulmonary disease with (acute) exacerbation: Secondary | ICD-10-CM | POA: Diagnosis not present

## 2023-10-01 DIAGNOSIS — N183 Chronic kidney disease, stage 3 unspecified: Secondary | ICD-10-CM | POA: Diagnosis not present

## 2023-10-01 DIAGNOSIS — I5033 Acute on chronic diastolic (congestive) heart failure: Secondary | ICD-10-CM | POA: Diagnosis not present

## 2023-10-01 DIAGNOSIS — N1831 Chronic kidney disease, stage 3a: Secondary | ICD-10-CM | POA: Diagnosis not present

## 2023-10-01 DIAGNOSIS — U099 Post covid-19 condition, unspecified: Secondary | ICD-10-CM | POA: Diagnosis not present

## 2023-10-01 DIAGNOSIS — I130 Hypertensive heart and chronic kidney disease with heart failure and stage 1 through stage 4 chronic kidney disease, or unspecified chronic kidney disease: Secondary | ICD-10-CM | POA: Diagnosis not present

## 2023-10-07 DIAGNOSIS — J441 Chronic obstructive pulmonary disease with (acute) exacerbation: Secondary | ICD-10-CM | POA: Diagnosis not present

## 2023-10-07 DIAGNOSIS — U099 Post covid-19 condition, unspecified: Secondary | ICD-10-CM | POA: Diagnosis not present

## 2023-10-07 DIAGNOSIS — N1832 Chronic kidney disease, stage 3b: Secondary | ICD-10-CM | POA: Diagnosis not present

## 2023-10-07 DIAGNOSIS — I5033 Acute on chronic diastolic (congestive) heart failure: Secondary | ICD-10-CM | POA: Diagnosis not present

## 2023-10-07 DIAGNOSIS — K7581 Nonalcoholic steatohepatitis (NASH): Secondary | ICD-10-CM | POA: Diagnosis not present

## 2023-10-09 DIAGNOSIS — I5033 Acute on chronic diastolic (congestive) heart failure: Secondary | ICD-10-CM | POA: Diagnosis not present

## 2023-10-09 DIAGNOSIS — J441 Chronic obstructive pulmonary disease with (acute) exacerbation: Secondary | ICD-10-CM | POA: Diagnosis not present

## 2023-10-09 DIAGNOSIS — I130 Hypertensive heart and chronic kidney disease with heart failure and stage 1 through stage 4 chronic kidney disease, or unspecified chronic kidney disease: Secondary | ICD-10-CM | POA: Diagnosis not present

## 2023-10-09 DIAGNOSIS — K7581 Nonalcoholic steatohepatitis (NASH): Secondary | ICD-10-CM | POA: Diagnosis not present

## 2023-10-09 DIAGNOSIS — U099 Post covid-19 condition, unspecified: Secondary | ICD-10-CM | POA: Diagnosis not present

## 2023-10-15 DIAGNOSIS — N1832 Chronic kidney disease, stage 3b: Secondary | ICD-10-CM | POA: Diagnosis not present

## 2023-10-21 DIAGNOSIS — N1832 Chronic kidney disease, stage 3b: Secondary | ICD-10-CM | POA: Diagnosis not present

## 2023-10-21 DIAGNOSIS — U099 Post covid-19 condition, unspecified: Secondary | ICD-10-CM | POA: Diagnosis not present

## 2023-10-21 DIAGNOSIS — I5033 Acute on chronic diastolic (congestive) heart failure: Secondary | ICD-10-CM | POA: Diagnosis not present

## 2023-10-21 DIAGNOSIS — J441 Chronic obstructive pulmonary disease with (acute) exacerbation: Secondary | ICD-10-CM | POA: Diagnosis not present

## 2023-10-21 DIAGNOSIS — K7581 Nonalcoholic steatohepatitis (NASH): Secondary | ICD-10-CM | POA: Diagnosis not present

## 2023-10-21 DIAGNOSIS — I130 Hypertensive heart and chronic kidney disease with heart failure and stage 1 through stage 4 chronic kidney disease, or unspecified chronic kidney disease: Secondary | ICD-10-CM | POA: Diagnosis not present

## 2023-10-29 DIAGNOSIS — J441 Chronic obstructive pulmonary disease with (acute) exacerbation: Secondary | ICD-10-CM | POA: Diagnosis not present

## 2023-10-29 DIAGNOSIS — I5033 Acute on chronic diastolic (congestive) heart failure: Secondary | ICD-10-CM | POA: Diagnosis not present

## 2023-10-29 DIAGNOSIS — I130 Hypertensive heart and chronic kidney disease with heart failure and stage 1 through stage 4 chronic kidney disease, or unspecified chronic kidney disease: Secondary | ICD-10-CM | POA: Diagnosis not present

## 2023-10-29 DIAGNOSIS — K7581 Nonalcoholic steatohepatitis (NASH): Secondary | ICD-10-CM | POA: Diagnosis not present

## 2023-10-29 DIAGNOSIS — N1832 Chronic kidney disease, stage 3b: Secondary | ICD-10-CM | POA: Diagnosis not present

## 2023-10-29 DIAGNOSIS — U099 Post covid-19 condition, unspecified: Secondary | ICD-10-CM | POA: Diagnosis not present

## 2023-10-30 ENCOUNTER — Other Ambulatory Visit: Payer: Self-pay | Admitting: Cardiology

## 2023-10-30 DIAGNOSIS — I5032 Chronic diastolic (congestive) heart failure: Secondary | ICD-10-CM

## 2023-11-01 DIAGNOSIS — E039 Hypothyroidism, unspecified: Secondary | ICD-10-CM | POA: Diagnosis not present

## 2023-11-01 DIAGNOSIS — N1831 Chronic kidney disease, stage 3a: Secondary | ICD-10-CM | POA: Diagnosis not present

## 2023-11-01 DIAGNOSIS — N183 Chronic kidney disease, stage 3 unspecified: Secondary | ICD-10-CM | POA: Diagnosis not present

## 2023-11-02 DIAGNOSIS — N1832 Chronic kidney disease, stage 3b: Secondary | ICD-10-CM | POA: Diagnosis not present

## 2023-11-02 DIAGNOSIS — K7581 Nonalcoholic steatohepatitis (NASH): Secondary | ICD-10-CM | POA: Diagnosis not present

## 2023-11-02 DIAGNOSIS — E039 Hypothyroidism, unspecified: Secondary | ICD-10-CM | POA: Diagnosis not present

## 2023-11-02 DIAGNOSIS — I130 Hypertensive heart and chronic kidney disease with heart failure and stage 1 through stage 4 chronic kidney disease, or unspecified chronic kidney disease: Secondary | ICD-10-CM | POA: Diagnosis not present

## 2023-11-02 DIAGNOSIS — Z9981 Dependence on supplemental oxygen: Secondary | ICD-10-CM | POA: Diagnosis not present

## 2023-11-02 DIAGNOSIS — U099 Post covid-19 condition, unspecified: Secondary | ICD-10-CM | POA: Diagnosis not present

## 2023-11-02 DIAGNOSIS — Z9181 History of falling: Secondary | ICD-10-CM | POA: Diagnosis not present

## 2023-11-02 DIAGNOSIS — I5033 Acute on chronic diastolic (congestive) heart failure: Secondary | ICD-10-CM | POA: Diagnosis not present

## 2023-11-02 DIAGNOSIS — J441 Chronic obstructive pulmonary disease with (acute) exacerbation: Secondary | ICD-10-CM | POA: Diagnosis not present

## 2023-11-06 DIAGNOSIS — I5032 Chronic diastolic (congestive) heart failure: Secondary | ICD-10-CM | POA: Diagnosis not present

## 2023-11-06 DIAGNOSIS — R110 Nausea: Secondary | ICD-10-CM | POA: Diagnosis not present

## 2023-11-09 DIAGNOSIS — I5033 Acute on chronic diastolic (congestive) heart failure: Secondary | ICD-10-CM | POA: Diagnosis not present

## 2023-11-09 DIAGNOSIS — K7581 Nonalcoholic steatohepatitis (NASH): Secondary | ICD-10-CM | POA: Diagnosis not present

## 2023-11-09 DIAGNOSIS — J441 Chronic obstructive pulmonary disease with (acute) exacerbation: Secondary | ICD-10-CM | POA: Diagnosis not present

## 2023-11-09 DIAGNOSIS — N1832 Chronic kidney disease, stage 3b: Secondary | ICD-10-CM | POA: Diagnosis not present

## 2023-11-19 DIAGNOSIS — I130 Hypertensive heart and chronic kidney disease with heart failure and stage 1 through stage 4 chronic kidney disease, or unspecified chronic kidney disease: Secondary | ICD-10-CM | POA: Diagnosis not present

## 2023-11-19 DIAGNOSIS — J441 Chronic obstructive pulmonary disease with (acute) exacerbation: Secondary | ICD-10-CM | POA: Diagnosis not present

## 2023-11-19 DIAGNOSIS — N1832 Chronic kidney disease, stage 3b: Secondary | ICD-10-CM | POA: Diagnosis not present

## 2023-11-19 DIAGNOSIS — I5033 Acute on chronic diastolic (congestive) heart failure: Secondary | ICD-10-CM | POA: Diagnosis not present

## 2023-11-19 DIAGNOSIS — K7581 Nonalcoholic steatohepatitis (NASH): Secondary | ICD-10-CM | POA: Diagnosis not present

## 2023-12-01 DIAGNOSIS — N1831 Chronic kidney disease, stage 3a: Secondary | ICD-10-CM | POA: Diagnosis not present

## 2023-12-01 DIAGNOSIS — E039 Hypothyroidism, unspecified: Secondary | ICD-10-CM | POA: Diagnosis not present

## 2023-12-01 DIAGNOSIS — N183 Chronic kidney disease, stage 3 unspecified: Secondary | ICD-10-CM | POA: Diagnosis not present

## 2023-12-17 ENCOUNTER — Ambulatory Visit (INDEPENDENT_AMBULATORY_CARE_PROVIDER_SITE_OTHER): Admitting: Pulmonary Disease

## 2023-12-17 ENCOUNTER — Encounter (HOSPITAL_BASED_OUTPATIENT_CLINIC_OR_DEPARTMENT_OTHER): Payer: Self-pay | Admitting: Pulmonary Disease

## 2023-12-17 VITALS — BP 135/82 | HR 77 | Ht 60.0 in | Wt 212.0 lb

## 2023-12-17 DIAGNOSIS — F32A Depression, unspecified: Secondary | ICD-10-CM | POA: Diagnosis not present

## 2023-12-17 DIAGNOSIS — J4550 Severe persistent asthma, uncomplicated: Secondary | ICD-10-CM | POA: Diagnosis not present

## 2023-12-17 MED ORDER — ALBUTEROL SULFATE HFA 108 (90 BASE) MCG/ACT IN AERS: 1.0000 | INHALATION_SPRAY | RESPIRATORY_TRACT | 5 refills | Status: AC | PRN

## 2023-12-17 MED ORDER — SPIRIVA RESPIMAT 1.25 MCG/ACT IN AERS: 2.0000 | INHALATION_SPRAY | Freq: Every day | RESPIRATORY_TRACT | 11 refills | Status: AC

## 2023-12-17 MED ORDER — FLUTICASONE-SALMETEROL 500-50 MCG/ACT IN AEPB: 1.0000 | INHALATION_SPRAY | Freq: Two times a day (BID) | RESPIRATORY_TRACT | 11 refills | Status: AC

## 2023-12-17 NOTE — Patient Instructions (Addendum)
 COVID-19 long hauler manifesting in dyspnea Moderate persistent asthma  CONTINUE Advair  500-50 ONE puff TWICE a day.  CONTINUE Spiriva  1.25 mcg TWO puffs in the morning CONTINUE Albuterol  or nebulizer AS NEEDED.  STOP singulair   Depression --Previously in therapy --Consider options with insurance for additional support/therapy. Online options like [employer]

## 2023-12-17 NOTE — Progress Notes (Signed)
 Subjective:   PATIENT ID: Becky [name] GENDER: female DOB: 1950/07/19, MRN: [id number]   HPI  Chief Complaint  Patient presents with   Follow-up    Reason for Visit: Follow-up   Becky [name] is a 73 year old female with hx COVID 08/2020 followed by post-viral asthma, hx CVA, hx melanoma, HTN, chronic diastolic heart failure, long QT interval,CKD IIIB, depression who presents for asthma follow-up  Synopsis: 2022 -She was diagnosed with COVID-19 in July 2022. Denies prior history of asthma or frequent respiratory infections.  2023 - March and May had outpatient exacerbation and hospitalization in July. Flovent>Advair . Improved control of symptoms. 2024 - Well-controlled on high dose Advair  until Nov when she had two exacerbations. Recent dizziness and deconditioning affecting her activity level. Hospitalized for angioedema secondary to telmisartan. 2025 - Spiriva  added in March. Exacerbation in mid April.   05/19/23 Since our last visit she reports she has worsening shortness of breath and needing albuterol  twice a day. Has had bilateral chest tightness. Denies cough or wheezing. Triggered by cold air. Denies fevers, chills.  12/17/23 Since our visit she has been hospitalized for hyponatremia in April and respiratory failure 2/2 heart failure and COPD exacerbation in June, discharged on room air. Improved shortness of breath but does have episodes of dyspnea with rest and activity. Triggered by humidity. Has wheeze and cough but resolves with rescue inhaler which she uses 2 times a week. Compliant with Advair  and uses Spiriva  once a week. Completed HHPT x 9 weeks which was helpful. Currently able to walk independently but does need cane some days. Denies leg swelling.  Asthma Control Test ACT Total Score  01/08/2023  1:12 PM 7  07/24/2022  2:40 PM 24  04/25/2022  3:27 PM 17   2023 Jan Feb Mar April May June July Aug Sept Oct Nov Dec     X  X X        2024 Jan Feb Mar  April May June July Aug Sept Oct Nov Dec             XX   2025 Jan Feb Mar April May June July Aug Sept Oct Nov Dec     X X  X             09/11/2021    3:24 PM  Results of the Epworth flowsheet  Sitting and reading 2  Watching TV 1  Sitting, inactive in a public place (e.g. a theatre or a meeting) 0  As a passenger in a car for an hour without a break 1  Lying down to rest in the afternoon when circumstances permit 1  Sitting and talking to someone 0  Sitting quietly after a lunch without alcohol 0  In a car, while stopped for a few minutes in traffic 0  Total score 5    Social History: Never smoker Childhood second hand smoke exposure  Past Medical History:  Diagnosis Date   Chronic kidney disease    CVA (cerebral vascular accident) (HCC) 11/14/2013   Depression    followed by Dr Vincente   Falls frequently 10/01/2015   Genital herpes    History of melanoma    followed by derm Dr Robinson   Hyperlipidemia    Hypertension    MCI (mild cognitive impairment) 10/01/2015   Pure hypercholesterolemia 11/14/2013   Stroke [facility])    Old stroke seen on MRI 12/2009, carotid doppler w minimal plaque, MRI off the head otherwise neg  for acute disease.   Thyroid  disease    hypothyroid. Followed by Dr Vincente   TIA (transient ischemic attack) 8/08   followed by neurologist Dr Rosemarie     Family History  Problem Relation Age of Onset   Breast cancer Mother    Hyperlipidemia Father    Hypertension Father    Heart attack Father    Hypertension Sister    Hyperlipidemia Sister    Dementia Maternal Aunt      Social History   Occupational History   Not on file  Tobacco Use   Smoking status: Never   Smokeless tobacco: Never  Vaping Use   Vaping status: Never Used  Substance and Sexual Activity   Alcohol use: No   Drug use: No   Sexual activity: Never    Allergies  Allergen Reactions   Penicillins Anaphylaxis   Telmisartan Other (See Comments)    Suspected cause of angioedema     Zofran  [Ondansetron ]    Iodinated Contrast Media Hives   Iohexol  Other (See Comments)     Desc: iodine    Latex Rash   Nsaids Other (See Comments)    CKD      Outpatient Medications Prior to Visit  Medication Sig Dispense Refill   acyclovir (ZOVIRAX) 200 MG capsule Take 200 mg by mouth as needed.     albuterol  (VENTOLIN  HFA) 108 (90 Base) MCG/ACT inhaler Inhale 1 puff into the lungs every 4 (four) hours as needed for wheezing or shortness of breath.     ALPRAZolam  (XANAX ) 0.25 MG tablet Take 0.25 mg by mouth 3 (three) times daily as needed for anxiety or sleep.  1   ARIPiprazole  (ABILIFY ) 5 MG tablet Take 5 mg by mouth at bedtime.     buPROPion  (WELLBUTRIN  XL) 150 MG 24 hr tablet Take 150 mg by mouth every morning.     clopidogrel  (PLAVIX ) 75 MG tablet Take 1 tablet (75 mg total) by mouth daily. 30 tablet 3   desvenlafaxine  (PRISTIQ ) 25 MG 24 hr tablet Take 1 tablet (25 mg total) by mouth in the morning. 30 tablet 0   Dexmethylphenidate  HCl 40 MG CP24 Take 1 capsule by mouth every morning.     dicyclomine  (BENTYL ) 20 MG tablet Take 1 tablet (20 mg total) by mouth 2 (two) times daily. 20 tablet 0   fluticasone -salmeterol (ADVAIR ) 500-50 MCG/ACT AEPB Inhale 1 puff into the lungs in the morning and at bedtime.     furosemide  (LASIX ) 20 MG tablet take 3 tablets Orally Once a day 90 tablet 3   levothyroxine  (SYNTHROID ) 25 MCG tablet Take 25 mcg by mouth daily before breakfast.     nadolol  (CORGARD ) 20 MG tablet Take 1 tablet (20 mg total) by mouth daily. Patient needs appt for # 90 supply or future refills. Please call office to schedule appt. 30 tablet 0   OVER THE COUNTER MEDICATION Take 1 tablet by mouth daily. Equate indigestion relief.     pramipexole  (MIRAPEX ) 0.25 MG tablet TAKE TWO TABLETS BY MOUTH AT BEDTIME 180 tablet 3   pravastatin  (PRAVACHOL ) 80 MG tablet Take 80 mg by mouth at bedtime.     prochlorperazine  (COMPAZINE ) 5 MG tablet Take 1 tablet (5 mg total) by mouth every 6 (six)  hours as needed for refractory nausea / vomiting. If promethazine  ineffective 30 tablet 0   promethazine  (PHENERGAN ) 25 MG tablet Take 1 tablet (25 mg total) by mouth every 6 (six) hours as needed for nausea or vomiting. 12 tablet  0   spironolactone  (ALDACTONE ) 25 MG tablet TAKE ONE TABLET BY MOUTH EVERY DAY 30 tablet 2   Tiotropium Bromide  Monohydrate (SPIRIVA  RESPIMAT) 1.25 MCG/ACT AERS Inhale 1 puff into the lungs daily. (Patient taking differently: Inhale 1 puff into the lungs in the morning.) 4 g 11   traZODone  (DESYREL ) 50 MG tablet Take 50-150 mg by mouth at bedtime as needed for sleep.     No facility-administered medications prior to visit.    Review of Systems  Constitutional:  Negative for chills, diaphoresis, fever, malaise/fatigue and weight loss.  HENT:  Negative for congestion.   Respiratory:  Positive for shortness of breath. Negative for cough, hemoptysis, sputum production and wheezing.   Cardiovascular:  Negative for chest pain, palpitations and leg swelling.     Objective:   Vitals:   12/17/23 1050  BP: 135/82  Pulse: 77  SpO2: 96%  Weight: 212 lb (96.2 kg)  Height: 5' (1.524 m)   SpO2: 96 %  Physical Exam: General: Well-appearing, no acute distress HENT: [place], AT Eyes: EOMI, no scleral icterus Respiratory: Clear to auscultation bilaterally.  No crackles, wheezing or rales Cardiovascular: RRR, -M/R/G, no JVD Extremities:-Edema,-tenderness Neuro: AAO x4, CNII-XII grossly intact Psych: Normal mood, normal affect   Data Reviewed:  Imaging: CXR 03/15/21 - Lingular scarring. No infiltrate, effusion or edema CT Chest 08/17/21 - No parenchymal abnormalities CXR 08/18/23 - No infiltrate effusion or edema  PFT: 05/06/21 FVC 1.88 (74%) FEV1 1.5 (79%) Ratio 81  TLC 127% DLCO 89% Interpretation: No obstructive defect on spirometry however air trapping present and F-V curves suggestive of obstructive defect/small airway disease. Normal gas exchange  Labs: CBC     Component Value Date/Time   WBC 14.2 (H) 08/19/2023 0359   RBC 4.68 08/19/2023 0359   HGB 13.8 08/19/2023 0359   HGB 12.0 05/13/2023 1602   HCT 44.2 08/19/2023 0359   HCT 35.7 05/13/2023 1602   PLT 309 08/19/2023 0359   PLT 341 06/30/2013 1300   MCV 94.4 08/19/2023 0359   MCV 89 06/30/2013 1300   MCH 29.5 08/19/2023 0359   MCHC 31.2 08/19/2023 0359   RDW 13.2 08/19/2023 0359   RDW 13.4 06/30/2013 1300   LYMPHSABS 1.8 08/18/2023 1304   LYMPHSABS 2.7 06/30/2013 1300   MONOABS 1.2 (H) 08/18/2023 1304   MONOABS 1.1 (H) 06/30/2013 1300   EOSABS 0.3 08/18/2023 1304   EOSABS 0.2 06/30/2013 1300   BASOSABS 0.1 08/18/2023 1304   BASOSABS 0.1 06/30/2013 1300   Abs eos 08/12/21 -300 IgE 08/12/21 -131    Assessment & Plan:   Discussion: 73 year old female never smoker with hx COVID 08/2020 followed by post-viral asthma, hx CVA, hx melanoma, HTN, chronic diastolic heart failure, long QT interval,CKD IIIB, depression who presents for follow-up. Discussed clinical course and management of asthma including bronchodilator regimen, preventive care including vaccinations and action plan for exacerbation.  COVID-19 long hauler manifesting in dyspnea Moderate persistent asthma  CONTINUE Advair  500-50 ONE puff TWICE a day.  CONTINUE Spiriva  1.25 mcg TWO puffs in the morning CONTINUE Albuterol  or nebulizer AS NEEDED.  STOP singulair   Severe OSA: Fatigue, daytime sleepiness Declined PAP titration study again today Feels like she is sleeping well  Deconditioning/Back pain --Completed PT  --Recommend tylenol  and ibuprofen as needed per bottle instructions --REFERRED [facility] PT  Depression --STOP singulair  --Currently in therapy --Consider options with insurance for additional support/therapy. Online options like [email or web address].[email or web address]  Health Maintenance Immunization History  Administered Date(s) Administered   Fluad Quad(high  Dose 65+) 11/20/2021   Fluzone Influenza virus vaccine,trivalent (IIV3),  split virus 12/30/2020   INFLUENZA, HIGH DOSE SEASONAL PF 11/05/2016, 12/09/2017, 12/16/2018, 12/29/2019   Influenza,inj,quad, With Preservative 10/19/2014, 10/22/2015   Moderna Sars-Covid-2 Vaccination 02/10/2020   PFIZER(Purple Top)SARS-COV-2 Vaccination 05/14/2019, 06/04/2019   Pneumococcal Conjugate-13 11/05/2016   Pneumococcal Polysaccharide-23 12/25/2017   Td 10/19/2014   Zoster Recombinant(Shingrix) 12/01/2017, 11/19/2018   Zoster, Live 12/01/2017, 11/19/2018   CT Lung Screen - not qualified. Never smoker  Orders Placed This Encounter  Procedures   Ambulatory referral to Physical Therapy    Referral Priority:   Routine    Referral Type:   Physical Medicine    Referral Reason:   Specialty Services Required    Requested Specialty:   Physical Therapy    Number of Visits Requested:   1   Meds ordered this encounter  Medications   fluticasone -salmeterol (ADVAIR ) 500-50 MCG/ACT AEPB    Sig: Inhale 1 puff into the lungs in the morning and at bedtime.    Dispense:  60 each    Refill:  11   Tiotropium Bromide  (SPIRIVA  RESPIMAT) 1.25 MCG/ACT AERS    Sig: Inhale 2 puffs into the lungs daily.    Dispense:  4 g    Refill:  11   albuterol  (VENTOLIN  HFA) 108 (90 Base) MCG/ACT inhaler    Sig: Inhale 1 puff into the lungs every 4 (four) hours as needed for wheezing or shortness of breath.    Dispense:  8 g    Refill:  5    Return in about 4 months (around 04/18/2024).  I have spent a total time of 30-minutes on the day of the appointment including chart review, data review, collecting history, coordinating care and discussing medical diagnosis and plan with the patient/family. Past medical history, allergies, medications were reviewed. Pertinent imaging, labs and tests included in this note have been reviewed and interpreted independently by me.  Willeen Novak Slater Staff, MD  Pulmonary Critical Care 12/17/2023 11:06 AM

## 2023-12-25 DIAGNOSIS — K120 Recurrent oral aphthae: Secondary | ICD-10-CM | POA: Diagnosis not present

## 2024-02-03 ENCOUNTER — Ambulatory Visit (HOSPITAL_BASED_OUTPATIENT_CLINIC_OR_DEPARTMENT_OTHER): Admitting: Physical Therapy

## 2024-02-03 ENCOUNTER — Encounter (HOSPITAL_BASED_OUTPATIENT_CLINIC_OR_DEPARTMENT_OTHER): Payer: Self-pay

## 2024-02-08 ENCOUNTER — Other Ambulatory Visit: Payer: Self-pay | Admitting: Cardiology

## 2024-02-08 DIAGNOSIS — I5032 Chronic diastolic (congestive) heart failure: Secondary | ICD-10-CM

## 2024-02-10 ENCOUNTER — Ambulatory Visit (HOSPITAL_BASED_OUTPATIENT_CLINIC_OR_DEPARTMENT_OTHER): Admitting: Physical Therapy

## 2024-02-17 ENCOUNTER — Encounter (HOSPITAL_BASED_OUTPATIENT_CLINIC_OR_DEPARTMENT_OTHER): Admitting: Physical Therapy

## 2024-02-23 ENCOUNTER — Encounter (HOSPITAL_BASED_OUTPATIENT_CLINIC_OR_DEPARTMENT_OTHER): Admitting: Physical Therapy

## 2024-03-14 ENCOUNTER — Telehealth: Payer: Self-pay | Admitting: Adult Health

## 2024-03-14 NOTE — Telephone Encounter (Signed)
 Pt called  stating that PCP requested  for Pt to be seen due to  dizziness and headaches  Opening was for tomorrow , Appt Scheduled

## 2024-03-15 ENCOUNTER — Encounter: Payer: Self-pay | Admitting: Adult Health

## 2024-03-15 ENCOUNTER — Ambulatory Visit: Admitting: Adult Health

## 2024-03-15 VITALS — BP 122/64 | HR 65 | Ht 60.0 in | Wt 212.6 lb

## 2024-03-15 DIAGNOSIS — R42 Dizziness and giddiness: Secondary | ICD-10-CM

## 2024-03-15 NOTE — Patient Instructions (Addendum)
 Your Plan:  Start physical therapy as scheduled  Please schedule sooner follow up visit with your cardiologist to discuss gradual worsening of dizziness  If symptoms persist after completion of therapy and your cardiologist does not believe your dizziness is related to cardiac concern, please let me know and I will discuss further testing/evaluation needs with Dr. Rosemarie           Thank you for coming to see us  at [facility]. I hope we have been able to provide you high quality care today.  You may receive a patient satisfaction survey over the next few weeks. We would appreciate your feedback and comments so that we may continue to improve ourselves and the health of our patients.

## 2024-03-15 NOTE — Progress Notes (Signed)
 " [facility] [address] [place]. [place] [address] [phone]       OFFICE FOLLOW UP NOTE  Becky. Becky [name] Date of Birth:  1950/11/17 Medical Record Number:  [id number]   Referring MD: Slater Staff  Reason for Referral: Daily headaches   Chief Complaint  Patient presents with   Follow-up    Pt in 3 alone Pt states dizziness while walking Pt denies headaches       HPI: Becky [name] is a 74 year old pleasant Caucasian lady seen today for consultation visit for headache.  History is obtained from the patient and review of electronic medical records and I reviewed pertinent available imaging films in PACS.  She has longstanding history of anxiety depression, chronic kidney disease, hypertension, hyperlipidemia old stroke in October 2011, TIA and hypothyroidism.  Patient states that she had COVID illness in July 2022 following which she has had daily headaches as well as nausea which is unrelenting and not going away.  Describes the headache as being bifrontal constant.  He describes the quality activity vomiting.  The headache is moderate 5-6/10 in severity.  She has constant nausea.  She does not vomit.  The headache increases with physical activity and she is unable to continue with work he has to lie down.  She feels better when she sleeps.  She has been taking 2 tablets of over-the-counter headache pills on a daily basis but to provide only short-term benefit.  She has not taken any daily headache prophylactic medications.  She has not had any brain imaging done.  She denies any visual symptoms, blurred vision, focal extremity weakness and minutes.  She does have longstanding history of mild short-term memory difficulties .  She was seen last in office in April 2021 by Harlene nurse practitioner.  He states that short-term memory and cognitive difficulties are unchanged and nonprogressive.  She has not been participating in any regular cognitively challenging  activities . Prior visit 10/01/2015 : Becky [name] is a 27 year lady with long-standing history of anxiety depression who for the last 1 year or so has had progressive memory and cognitive difficulties as well as increased frequency of falls and balance problems. She is accompanied today by a cousin Becky [name]. Patient admits she did have some mild cognitive memory problems even prior to a year but since she got ECT in June 2016 this seems to have been more pronounced. She got only 4 treatments every other day. 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 She gives several examples like going to plan to change direct drops on her checking account but once she reached that she was unable to tell the bank arrest why she had come. On occasion while driving home from a friend's house she could not remember the way home and got lost. 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 She denies any headaches, extremity weakness. She is also had increased frequency of falls and states that she falls without any warning. She does not feel like she didn't pass out or have any syncopal symptoms. She falls all of a sudden. She is able to stop herself sometimes but has bruised herself. She has never lost consciousness. She denies any tingling numbness pain or burning in her feet. She denies walking like a drunk. She has  no history of significant head injury with loss of consciousness. She has no family history of dementia. She lives alone she is now getting some help with finances but needs to keep a calendar calendar to write things down so she does not forget. She has a remote history of tiny (lacunar infarct in  September 2008. MRA of the brain showed only mild atherosclerotic changes She presented at that time the 30 minute episode of right-sided facial numbness and  slurred speech. I had seen her in the hospital at that time but she was lost to follow-up. She states she's had no further recurrent stroke or TIA symptoms.  Update 06/29/2019 JM: Becky [name] returns for follow-up regarding mild cognitive impairment with prior stroke history.  Initial evaluation with Dr. Rosemarie in 09/2015 due to progressive memory and cognitive difficulties along with increase frequency of falls and balance problems.  Extensive imaging and lab work-up unremarkable for any reversible causes.  Memory has been stable since prior visit 1 year ago with Dr. Rosemarie without worsening.  No reoccurring gait or balance concerns.  She denies recurrent stroke or TIA symptoms since 2008.  Continues on aspirin  for secondary stroke prevention.  Crestor currently on hold due to elevated liver enzymes currently being followed by [facility] gastroenterology and PCP who plans on repeat lab work around June/July.  Blood pressure today 137/85.  No concerns at this time.    Update 01/29/2022 Dr. Rosemarie: Becky [name] is a 74 year old pleasant Caucasian lady seen today for consultation visit for headache.  History is obtained from the patient and review of electronic medical records and I reviewed pertinent available imaging films in PACS.  She has longstanding history of anxiety depression, chronic kidney disease, hypertension, hyperlipidemia old stroke in October 2011, TIA and hypothyroidism.  Patient states that she had COVID illness in July 2022 following which she has had daily headaches as well as nausea which is unrelenting and not going away.  Describes the headache as being bifrontal constant.  He describes the quality activity vomiting.  The headache is moderate 5-6/10 in severity.  She has constant nausea.  She does not vomit.  The headache increases with physical activity and she is unable to continue with work he has to lie down.  She feels better when she sleeps.  She has been taking 2 tablets of over-the-counter  headache pills on a daily basis but to provide only short-term benefit.  She has not taken any daily headache prophylactic medications.  She has not had any brain imaging done.  She denies any visual symptoms, blurred vision, focal extremity weakness and minutes.  She does have longstanding history of mild short-term memory difficulties .  She was seen last in office in April 2021 by Harlene nurse practitioner.  He states that short-term memory and cognitive difficulties are unchanged and nonprogressive.  She has not been participating in any regular cognitively challenging activities .   Update 05/05/2022 JM: Patient returns for headache follow-up.  She was started on topiramate  25 mg twice daily at prior visit as well as recommended completion of brain MRI.  Reports improvement of headaches since prior visit, only occurring on occasion currently, tolerating topiramate  well. Has limited OTC use. Recently started saline eye drops which she believes has also helped further improve headaches. MR brian not yet completed.    Update 05/19/2023 JM: Patient returns for follow-up visit after prior visit 1 year ago.  Since prior visit, she self discontinued topiramate  due to resolution of  headaches.  She has had very occasional mild headaches which resolved after use of Tylenol .  MRI brain 07/2022 which showed progression of perisylvian and mesial parietal atrophy as well as moderate small vessel disease and left frontal encephalomalacia and gliosis, no acute findings noted.  She was hospitalized back in December for angioedema felt secondary to telmisartan which was discontinued and started on amlodipine .  Reports episode of mild tongue swelling this morning (without any other symptoms) and resolved without intervention. Was seen by per PCP this morning.  No clear trigger identified.  Reports sensation of dizziness and generalized weakness after tongue swelling.  She is currently working with PT for generalized  weakness complaints.  She has been having episodes of dizziness almost daily over the past year.  Was seen by cardiology last week who noted positive orthostatics, amlodipine  discontinued and placed on hydrochlorothiazide .  She has difficult time describing her dizziness, feels like she may fall when standing but denies being pulled to one side or swaying while walking, reports more of a lightheadedness but denies feeling like she may pass out, denies spinning sensation, does have some increased hearing loss, can have increased symptoms when turning head quickly towards the left.      Update 03/15/2024 JM: Patient returns for follow-up visit regarding dizziness complaints.  Previously seen 05/2023 with dizziness complaints which were attributed to orthostatic hypotension and antihypertensive regimen.  She was evaluated by PT and did not feel related to BPPV.  She is advised to follow-up as needed.  She reports persistent dizziness over the past year but feels this has been gradually worsening.  Denies any acute or sudden worsening.  She continues to use a cane for ambulation.  Recently seen by PCP who referred her to PT and is scheduled to start next week.  Also completed lab work but unable to view via epic, patient reports she is still waiting on results.  She denies any one-sided weakness, numbness/tingling, vision changes or speech changes.  Dizziness still present primarily with position changes such as when going from sitting to standing.  Dizziness is not present while sitting.  Denies worsening dizziness with quick head movements.  Previously seen by cardiology back in July for CHF with plans on 1 year follow-up.  She had adequate water  intake, typically 4 to 5  16 ounce water  bottles per day.  She does not use any compression stockings.  She has been having left ear pain since onset of multiple mouth ulcers over the past couple of months but denies any changes in hearing or onset of tinnitus.  Of note,  she is on multiple medications that could be contributing to symptoms including nightly use of Xanax , Abilify , Wellbutrin , Pristiq , dexmethylphenidate , Lasix , Corgard , Mirapex , Compazine , Phenergan , Bentyl , Aldactone , and trazodone .     ROS:   14 system review of systems is positive for those listed in HPI and all other systems negative  PMH:  Past Medical History:  Diagnosis Date   Chronic kidney disease    CVA (cerebral vascular accident) (HCC) 11/14/2013   Depression    followed by Dr Vincente Noel frequently 10/01/2015   Genital herpes    History of melanoma    followed by derm Dr Robinson   Hyperlipidemia    Hypertension    MCI (mild cognitive impairment) 10/01/2015   Pure hypercholesterolemia 11/14/2013   Stroke [facility])    Old stroke seen on MRI 12/2009, carotid doppler w minimal plaque, MRI off the head otherwise neg for  acute disease.   Thyroid  disease    hypothyroid. Followed by Dr Vincente   TIA (transient ischemic attack) 8/08   followed by neurologist Dr Rosemarie    Social History:  Social History   Socioeconomic History   Marital status: Single    Spouse name: Not on file   Number of children: Not on file   Years of education: Not on file   Highest education level: Not on file  Occupational History   Not on file  Tobacco Use   Smoking status: Never   Smokeless tobacco: Never  Vaping Use   Vaping status: Never Used  Substance and Sexual Activity   Alcohol use: No   Drug use: No   Sexual activity: Never  Other Topics Concern   Not on file  Social History Narrative   Not on file   Social Drivers of Health   Tobacco Use: Low Risk (12/25/2023)   Received from [facility]   Patient History    Smoking Tobacco Use: Never    Smokeless Tobacco Use: Never    Passive Exposure: Not on file  Financial Resource Strain: Not on file  Food Insecurity: No Food Insecurity (08/18/2023)   Epic    Worried About Programme Researcher, Broadcasting/film/video in the Last Year: Never true    Ran Out of  Food in the Last Year: Never true  Transportation Needs: No Transportation Needs (08/18/2023)   Epic    Lack of Transportation (Medical): No    Lack of Transportation (Non-Medical): No  Physical Activity: Not on file  Stress: Not on file  Social Connections: Patient Declined (08/18/2023)   Social Connection and Isolation Panel    Frequency of Communication with Friends and Family: Patient declined    Frequency of Social Gatherings with Friends and Family: Patient declined    Attends Religious Services: Patient declined    Database Administrator or Organizations: Patient declined    Attends Banker Meetings: Patient declined    Marital Status: Patient declined  Intimate Partner Violence: Not At Risk (08/18/2023)   Epic    Fear of Current or Ex-Partner: No    Emotionally Abused: No    Physically Abused: No    Sexually Abused: No  Depression (PHQ2-9): Not on file  Alcohol Screen: Not on file  Housing: Low Risk (08/18/2023)   Epic    Unable to Pay for Housing in the Last Year: No    Number of Times Moved in the Last Year: 0    Homeless in the Last Year: No  Utilities: Not At Risk (08/18/2023)   Epic    Threatened with loss of utilities: No  Health Literacy: Not on file    Medications:   Current Outpatient Medications on File Prior to Visit  Medication Sig Dispense Refill   acyclovir (ZOVIRAX) 200 MG capsule Take 200 mg by mouth as needed.     albuterol  (VENTOLIN  HFA) 108 (90 Base) MCG/ACT inhaler Inhale 1 puff into the lungs every 4 (four) hours as needed for wheezing or shortness of breath. 8 g 5   ALPRAZolam  (XANAX ) 0.25 MG tablet Take 0.25 mg by mouth 3 (three) times daily as needed for anxiety or sleep.  1   ARIPiprazole  (ABILIFY ) 5 MG tablet Take 5 mg by mouth at bedtime.     buPROPion  (WELLBUTRIN  XL) 150 MG 24 hr tablet Take 150 mg by mouth every morning.     clopidogrel  (PLAVIX ) 75 MG tablet Take 1 tablet (75 mg  total) by mouth daily. 30 tablet 3   desvenlafaxine   (PRISTIQ ) 25 MG 24 hr tablet Take 1 tablet (25 mg total) by mouth in the morning. 30 tablet 0   Dexmethylphenidate  HCl 40 MG CP24 Take 1 capsule by mouth every morning.     dicyclomine  (BENTYL ) 20 MG tablet Take 1 tablet (20 mg total) by mouth 2 (two) times daily. 20 tablet 0   fluticasone -salmeterol (ADVAIR ) 500-50 MCG/ACT AEPB Inhale 1 puff into the lungs in the morning and at bedtime. 60 each 11   furosemide  (LASIX ) 20 MG tablet TAKE THREE TABLETS BY MOUTH ONCE DAILY 90 tablet 2   levothyroxine  (SYNTHROID ) 25 MCG tablet Take 25 mcg by mouth daily before breakfast.     nadolol  (CORGARD ) 20 MG tablet Take 1 tablet (20 mg total) by mouth daily. Patient needs appt for # 90 supply or future refills. Please call office to schedule appt. 30 tablet 0   OVER THE COUNTER MEDICATION Take 1 tablet by mouth daily. Equate indigestion relief.     pramipexole  (MIRAPEX ) 0.25 MG tablet TAKE TWO TABLETS BY MOUTH AT BEDTIME 180 tablet 3   pravastatin  (PRAVACHOL ) 80 MG tablet Take 80 mg by mouth at bedtime.     prochlorperazine  (COMPAZINE ) 5 MG tablet Take 1 tablet (5 mg total) by mouth every 6 (six) hours as needed for refractory nausea / vomiting. If promethazine  ineffective 30 tablet 0   promethazine  (PHENERGAN ) 25 MG tablet Take 1 tablet (25 mg total) by mouth every 6 (six) hours as needed for nausea or vomiting. 12 tablet 0   spironolactone  (ALDACTONE ) 25 MG tablet TAKE ONE TABLET BY MOUTH EVERY DAY 30 tablet 2   Tiotropium Bromide  (SPIRIVA  RESPIMAT) 1.25 MCG/ACT AERS Inhale 2 puffs into the lungs daily. 4 g 11   traZODone  (DESYREL ) 50 MG tablet Take 50-150 mg by mouth at bedtime as needed for sleep.     No current facility-administered medications on file prior to visit.    Allergies:   Allergies  Allergen Reactions   Penicillins Anaphylaxis   Telmisartan Other (See Comments)    Suspected cause of angioedema    Zofran  [Ondansetron ]    Iodinated Contrast Media Hives   Iohexol  Other (See Comments)      Desc: iodine    Latex Rash   Nsaids Other (See Comments)    CKD     Physical Exam Today's Vitals   03/15/24 1459  BP: 122/64  Pulse: 65  Weight: 212 lb 9.6 oz (96.4 kg)  Height: 5' (1.524 m)   Body mass index is 41.52 kg/m.  General:Pleasant elderly Caucasian lady seated, in no evident distress  Neurologic Exam Mental Status: Awake and fully alert.  Fluent speech and language.  Oriented to place and time. Recent memory mildly impaired and remote memory intact. Attention span, concentration and fund of knowledge appropriate. Mood and affect appropriate.  Cranial Nerves: Pupils equal, briskly reactive to light. Extraocular movements full without nystagmus but dysmetric saccades when looking to the left. Visual fields full to confrontation. Hearing intact. Facial sensation intact. Face, tongue, palate moves normally and symmetrically.  Motor: Normal bulk and tone. Normal strength in all tested extremity muscles. Sensory.: intact to touch , pinprick , position and vibratory sensation.  Coordination: Rapid alternating movements normal in all extremities. Finger-to-nose and heel-to-shin performed accurately bilaterally. Gait and Station: Arises from chair with mild difficulty. Stance is slightly hunched. Gait demonstrates slow cautious gait with mild unsteadiness especially initially upon standing and use of cane.  Tandem walk and heel toe attempted         ASSESSMENT/PLAN: 74 year old Caucasian lady with persistent chronic dizziness over the past 1-2 years of unclear exact etiology.  Previously improved some after adjustment to antihypertensive regimen but now complaining of gradual worsening over the past year.      Dizziness - Unclear exact etiology, possibly due to CHF and orthostatic hypotension vs medication side effects vs psychogenic. Symptoms do not currently fit with BPPV diagnosis - Prior extensive neurological workup largely benign - neuro exam today intact, do not  feel like repeat work up indicated at this time - she is scheduled to start PT next week and encouraged to pursue this - Advised to schedule follow-up visit with cardiology to ensure no worsening of cardiac functioning - Advised close ongoing follow-up with PCP for medication management and ongoing discussion regarding need of multiple possibly contributing -Prior MRI brain 07/2022 without acute abnormality or significant concerning findings -02/2023 CT head no acute abnormality, CTA head/neck moderate right PCA stenosis otherwise unremarkable      No further recommendations from neurological standpoint at this time.  Recommend follow-up on an as-needed basis.      Harlene Bogaert, AGNP-BC  [facility] [address] [place], [place] [address]  Phone [phone] Fax [phone] Note: This document was prepared with digital dictation and possible smart phrase technology. Any transcriptional errors that result from this process are unintentional.  "

## 2024-03-17 ENCOUNTER — Ambulatory Visit (HOSPITAL_BASED_OUTPATIENT_CLINIC_OR_DEPARTMENT_OTHER): Admitting: Physical Therapy

## 2024-03-24 ENCOUNTER — Ambulatory Visit (HOSPITAL_BASED_OUTPATIENT_CLINIC_OR_DEPARTMENT_OTHER): Admitting: Physical Therapy

## 2024-03-31 ENCOUNTER — Ambulatory Visit (HOSPITAL_BASED_OUTPATIENT_CLINIC_OR_DEPARTMENT_OTHER): Attending: Pulmonary Disease | Admitting: Physical Therapy

## 2024-03-31 ENCOUNTER — Encounter (HOSPITAL_BASED_OUTPATIENT_CLINIC_OR_DEPARTMENT_OTHER): Payer: Self-pay | Admitting: Physical Therapy

## 2024-03-31 DIAGNOSIS — M6281 Muscle weakness (generalized): Secondary | ICD-10-CM | POA: Insufficient documentation

## 2024-03-31 DIAGNOSIS — J4550 Severe persistent asthma, uncomplicated: Secondary | ICD-10-CM | POA: Diagnosis present

## 2024-03-31 DIAGNOSIS — R2681 Unsteadiness on feet: Secondary | ICD-10-CM | POA: Diagnosis not present

## 2024-03-31 DIAGNOSIS — R2689 Other abnormalities of gait and mobility: Secondary | ICD-10-CM | POA: Diagnosis not present

## 2024-03-31 NOTE — Therapy (Signed)
 " OUTPATIENT PHYSICAL THERAPY LOWER EXTREMITY EVALUATION   Patient Name: Becky [name] MRN: [id number] DOB:[birth year], 74 y.o., female Today's Date: 03/31/2024  END OF SESSION:  PT End of Session - 03/31/24 1355     Visit Number 1    Number of Visits 16    Date for Recertification  05/26/24    Authorization Type [employer] MEDICARE    Progress Note Due on Visit 10    PT Start Time 1350    PT Stop Time 1428    PT Time Calculation (min) 38 min    Activity Tolerance Patient tolerated treatment well    Behavior During Therapy WFL for tasks assessed/performed          Past Medical History:  Diagnosis Date   Chronic kidney disease    CVA (cerebral vascular accident) (HCC) 11/14/2013   Depression    followed by Dr Vincente Noel frequently 10/01/2015   Genital herpes    History of melanoma    followed by derm Dr Robinson   Hyperlipidemia    Hypertension    MCI (mild cognitive impairment) 10/01/2015   Pure hypercholesterolemia 11/14/2013   Stroke (HCC)    Old stroke seen on MRI 12/2009, carotid doppler w minimal plaque, MRI off the head otherwise neg for acute disease.   Thyroid  disease    hypothyroid. Followed by Dr Vincente   TIA (transient ischemic attack) 8/08   followed by neurologist Dr Rosemarie   Past Surgical History:  Procedure Laterality Date   ABDOMINAL HYSTERECTOMY     APPENDECTOMY     CHOLECYSTECTOMY     MELANOMA EXCISION     x2   Patient Active Problem List   Diagnosis Date Noted   Acute hypoxemic respiratory failure (HCC) 08/18/2023   Hypokalemia 06/29/2023   Hyponatremia 06/27/2023   Angioedema 02/01/2023   Chronic fatigue 09/11/2021   Shortness of breath at rest 08/17/2021   Moderate persistent asthma 05/06/2021   History of COVID-19 03/19/2021   COVID-19 long hauler manifesting chronic dyspnea 03/19/2021   Thyroid  disease    Stroke [facility])    Hypertension    Hyperlipidemia    History of melanoma    Genital herpes    Depression    CKD (chronic kidney  disease) stage 3, GFR 30-59 ml/min (HCC)    MCI (mild cognitive impairment) 10/01/2015   Falls frequently 10/01/2015   Essential hypertension, benign 11/14/2013   CVA (cerebral vascular accident) (HCC) 11/14/2013   Pure hypercholesterolemia 11/14/2013   TIA (transient ischemic attack) 10/02/2006    PCP: Dayna Motto, DO   REFERRING PROVIDER: Kassie Acquanetta Bradley, MD  REFERRING DIAG: J45.50 (ICD-10-CM) - Severe persistent asthma without complication (HCC) Post-viral asthma. Deconditioning.  THERAPY DIAG:  Muscle weakness (generalized)  Unsteadiness on feet  Other abnormalities of gait and mobility  Rationale for Evaluation and Treatment: Rehabilitation  ONSET DATE: chronic  SUBJECTIVE:   SUBJECTIVE STATEMENT: Patient states dizziness today which increases and decreased often. Continued weakness since being sick and having issues with CHF. Gets fatigued quickly with walking. Had home health a couple of months ago.   PERTINENT HISTORY: Hx CVA, CKD, HTN, HLD, hx covid, asthma,  PAIN:  Are you having pain? No  PRECAUTIONS: None  WEIGHT BEARING RESTRICTIONS: No  FALLS:  Has patient fallen in last 6 months? No   PLOF: Independent  PATIENT GOALS: wants to build strength and stamina    OBJECTIVE: (objective measures from initial evaluation unless otherwise dated)  PATIENT SURVEYS:  LEFS  Extreme difficulty/unable (0), Quite a bit of difficulty (1), Moderate difficulty (2), Little difficulty (3), No difficulty (4) Survey date:  03/31/24  Any of your usual work, housework or school activities 3  2. Usual hobbies, recreational or sporting activities 1  3. Getting into/out of the bath 0  4. Walking between rooms 3  5. Putting on socks/shoes 2  6. Squatting  2  7. Lifting an object, like a bag of groceries from the floor 2  8. Performing light activities around your home 3  9. Performing heavy activities around your home 1  10. Getting into/out of a car 3  11. Walking  2 blocks 1  12. Walking 1 mile 0  13. Going up/down 10 stairs (1 flight) 1  14. Standing for 1 hour 0  15.  sitting for 1 hour 3  16. Running on even ground 2  17. Running on uneven ground 1  18. Making sharp turns while running fast 0  19. Hopping  2  20. Rolling over in bed 4  Score total:  34/80     COGNITION: Overall cognitive status: Within functional limits for tasks assessed     SENSATION: WFL  POSTURE: No Significant postural limitations   LOWER EXTREMITY ROM: WFL for tasks assessed  Active ROM Right eval Left eval  Hip flexion    Hip extension    Hip abduction    Hip adduction    Hip internal rotation    Hip external rotation    Knee flexion    Knee extension    Ankle dorsiflexion    Ankle plantarflexion    Ankle inversion    Ankle eversion     (Blank rows = not tested) *= pain/symptoms  LOWER EXTREMITY MMT:  MMT Right eval Left eval  Hip flexion 4+ 4+  Hip extension    Hip abduction    Hip adduction    Hip internal rotation    Hip external rotation    Knee flexion 4+ 4+  Knee extension 4+ 4+  Ankle dorsiflexion 5 5  Ankle plantarflexion    Ankle inversion    Ankle eversion     (Blank rows = not tested) *= pain/symptoms   FUNCTIONAL TESTS:  5 times sit to stand: 17.77 seconds without UE support SLS: < 3 seconds bilateral   GAIT: Distance walked: 100 feet Assistive device utilized: Single point cane Level of assistance: Modified independence Comments: decreased cadence   TODAY'S TREATMENT:                                                                                                                              DATE:  03/31/24 Eval, HEP, education    PATIENT EDUCATION:  Education details: Patient educated on exam findings, POC, scope of PT, HEP, relevant anatomy and biomechanics, use of SPC. Person educated: Patient Education method: Explanation, Demonstration, and Handouts Education comprehension: verbalized understanding,  returned demonstration, verbal cues required, and tactile  cues required  HOME EXERCISE PROGRAM: Access Code: [email or web address] URL: [email or web address] Date: 03/31/2024 Prepared by: Prentice Apoorva Bugay  Exercises - Seated Long Arc Quad  - 1 x daily - 7 x weekly - 10 reps - 5 second hold - Seated March  - 1 x daily - 7 x weekly - 2 sets - 10 reps - Standing Hip Abduction with Counter Support  - 1 x daily - 7 x weekly - 2 sets - 10 reps  ASSESSMENT:  CLINICAL IMPRESSION: Patient a 74 y.o. y.o. female who was seen today for physical therapy evaluation and treatment for Deconditioning. Patient presents with pain limited deficits in bilateral LE and general strength, ROM, endurance, activity tolerance, gait, balance, and functional mobility with ADL. Patient is having to modify and restrict ADL as indicated by outcome measure score as well as subjective information and objective measures which is affecting overall participation. Patient will benefit from skilled physical therapy in order to improve function and reduce impairment.  OBJECTIVE IMPAIRMENTS: Abnormal gait, decreased activity tolerance, decreased balance, decreased endurance, decreased mobility, difficulty walking, decreased ROM, decreased strength, increased muscle spasms, impaired flexibility, improper body mechanics, and pain  ACTIVITY LIMITATIONS: lifting, bending, standing, squatting, stairs, transfers, toileting, dressing, hygiene/grooming, locomotion level, and caring for others  PARTICIPATION LIMITATIONS: meal prep, cleaning, laundry, shopping, community activity, and yard work  PERSONAL FACTORS: 3+ comorbidities: Hx CVA, CKD, HTN, HLD, hx covid, asthma are also affecting patient's functional outcome.   REHAB POTENTIAL: Good  CLINICAL DECISION MAKING: Evolving/moderate complexity  EVALUATION COMPLEXITY: Moderate   GOALS: Goals reviewed with patient? Yes  SHORT TERM GOALS: Target date: 04/28/2024    Patient will  be independent with HEP in order to improve functional outcomes. Baseline: Goal status: MET  2.  Patient will report at least 25% improvement in symptoms for improved quality of life. Baseline: Goal status: MET   LONG TERM GOALS: Target date: 05/26/2024    Patient will report at least 75% improvement in symptoms for improved quality of life. Baseline:  Goal status: INITIAL  2.  Patient will improve LEFS score by at least 18 points in order to indicate improved tolerance to activity. Baseline:  Goal status: INITIAL  3.  Patient will be able to navigate stairs with reciprocal pattern without compensation in order to demonstrate improved LE strength. Baseline:  Goal status: INITIAL  4.  Patient will demonstrate grade of 5/5 MMT grade in all tested musculature as evidence of improved strength to assist with stair ambulation and gait.   Baseline:  Goal status: INITIAL  5.  Patient will be able to complete 5x STS in under 11.4 seconds in order to reduce the risk of falls. Baseline:  Goal status: INITIAL     PLAN:  PT FREQUENCY: 1-2x/week  PT DURATION: 8 weeks  PLANNED INTERVENTIONS: [id number]- PT Re-evaluation, 97110-Therapeutic exercises, 97530- Therapeutic activity, [id number]- Neuromuscular re-education, 97535- Self Care, [id number]- Manual therapy, [id number]- Gait training, [phone]- Orthotic Fit/training, [phone]- Canalith repositioning, [id number]- Aquatic Therapy, [phone]- Splinting, [phone]- Wound care (first 20 sq cm), 97598- Wound care (each additional 20 sq cm)Patient/Family education, Balance training, Stair training, Taping, Dry Needling, Joint mobilization, Joint manipulation, Spinal manipulation, Spinal mobilization, Scar mobilization, and DME instructions.  PLAN FOR NEXT SESSION: general strengthening; balance and gait retraining; core stabilization; postural strengthening, aerobic capacity training   Prentice GORMAN Stains, PT, DPT 03/31/2024, 3:04 PM   Date of referral: 12/16/24 Referring  provider: Kassie Acquanetta Bradley, MD Referring diagnosis? J45.50 (ICD-10-CM) - Severe persistent asthma without complication (HCC)  Treatment diagnosis? (if different than referring diagnosis) M62.81   What was this (referring dx) caused by? Ongoing Issue  Lysle of Condition: Chronic (continuous duration > 3 months)   Laterality: Both  Current Functional Measure Score: LEFS 34/80  Objective measurements identify impairments when they are compared to normal values, the uninvolved extremity, and prior level of function.  [x]  Yes  []  No  Objective assessment of functional ability: Severe functional limitations   Briefly describe symptoms: Patient with chronic deconditioning due to comorbidity and health complications  How did symptoms start: chronic  Average pain intensity:  Last 24 hours: none currently  Past week:   How often does the pt experience symptoms? Constantly  How much have the symptoms interfered with usual daily activities? Quite a bit  How has condition changed since care began at this facility? NA - initial visit  In general, how is the patients overall health? Fair   BACK PAIN (STarT Back Screening Tool) No  "

## 2024-04-07 ENCOUNTER — Encounter (HOSPITAL_BASED_OUTPATIENT_CLINIC_OR_DEPARTMENT_OTHER): Admitting: Physical Therapy

## 2024-04-19 ENCOUNTER — Ambulatory Visit (HOSPITAL_BASED_OUTPATIENT_CLINIC_OR_DEPARTMENT_OTHER): Admitting: Pulmonary Disease

## 2024-05-05 ENCOUNTER — Encounter (HOSPITAL_BASED_OUTPATIENT_CLINIC_OR_DEPARTMENT_OTHER): Admitting: Physical Therapy

## 2024-05-12 ENCOUNTER — Encounter (HOSPITAL_BASED_OUTPATIENT_CLINIC_OR_DEPARTMENT_OTHER): Admitting: Physical Therapy

## 2024-05-19 ENCOUNTER — Encounter (HOSPITAL_BASED_OUTPATIENT_CLINIC_OR_DEPARTMENT_OTHER): Admitting: Physical Therapy

## 2024-05-26 ENCOUNTER — Encounter (HOSPITAL_BASED_OUTPATIENT_CLINIC_OR_DEPARTMENT_OTHER): Admitting: Physical Therapy
